# Patient Record
Sex: Male | Born: 1990
Health system: Southern US, Community
[De-identification: ages and names within clinical notes are randomized; demographics above are authoritative.]

## PROBLEM LIST (undated history)

## (undated) ENCOUNTER — Ambulatory Visit: Payer: No Typology Code available for payment source | Source: Home / Self Care

## (undated) DIAGNOSIS — A539 Syphilis, unspecified: Secondary | ICD-10-CM

## (undated) DIAGNOSIS — F419 Anxiety disorder, unspecified: Secondary | ICD-10-CM

## (undated) DIAGNOSIS — Z765 Malingerer [conscious simulation]: Secondary | ICD-10-CM

## (undated) DIAGNOSIS — M869 Osteomyelitis, unspecified: Secondary | ICD-10-CM

## (undated) DIAGNOSIS — B20 Human immunodeficiency virus [HIV] disease: Secondary | ICD-10-CM

## (undated) DIAGNOSIS — F99 Mental disorder, not otherwise specified: Secondary | ICD-10-CM

## (undated) DIAGNOSIS — Z21 Asymptomatic human immunodeficiency virus [HIV] infection status: Secondary | ICD-10-CM

## (undated) DIAGNOSIS — F319 Bipolar disorder, unspecified: Secondary | ICD-10-CM

## (undated) DIAGNOSIS — D849 Immunodeficiency, unspecified: Secondary | ICD-10-CM

## (undated) HISTORY — DX: Mental disorder, not otherwise specified: F99

## (undated) HISTORY — DX: Anxiety disorder, unspecified: F41.9

---
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
## 2010-09-27 ENCOUNTER — Emergency Department (HOSPITAL_COMMUNITY)
Admission: EM | Admit: 2010-09-27 | Discharge: 2010-09-27 | Disposition: A | Discharge status: Home / Self Care | Payer: Self-pay | Attending: Emergency Medicine | Admitting: Emergency Medicine

## 2010-09-27 DIAGNOSIS — K047 Periapical abscess without sinus: Secondary | ICD-10-CM | POA: Insufficient documentation

## 2010-09-27 DIAGNOSIS — K029 Dental caries, unspecified: Secondary | ICD-10-CM | POA: Insufficient documentation

## 2010-10-21 ENCOUNTER — Emergency Department (HOSPITAL_COMMUNITY)
Admission: EM | Admit: 2010-10-21 | Discharge: 2010-10-21 | Disposition: A | Discharge status: Home / Self Care | Payer: Self-pay | Attending: Emergency Medicine | Admitting: Emergency Medicine

## 2010-10-21 DIAGNOSIS — R10819 Abdominal tenderness, unspecified site: Secondary | ICD-10-CM | POA: Insufficient documentation

## 2010-10-21 DIAGNOSIS — R109 Unspecified abdominal pain: Secondary | ICD-10-CM | POA: Insufficient documentation

## 2010-10-21 DIAGNOSIS — R4182 Altered mental status, unspecified: Secondary | ICD-10-CM | POA: Insufficient documentation

## 2010-10-21 DIAGNOSIS — R443 Hallucinations, unspecified: Secondary | ICD-10-CM | POA: Insufficient documentation

## 2010-10-21 LAB — CBC
MCH: 26.9 pg (ref 26.0–34.0)
MCHC: 35 g/dL (ref 30.0–36.0)
MCV: 76.9 fL — ABNORMAL LOW (ref 78.0–100.0)
Platelets: 204 10*3/uL (ref 150–400)
RBC: 5.5 MIL/uL (ref 4.22–5.81)
RDW: 15.3 % (ref 11.5–15.5)

## 2010-10-21 LAB — DIFFERENTIAL
Basophils Relative: 0 % (ref 0–1)
Eosinophils Absolute: 0.1 10*3/uL (ref 0.0–0.7)
Eosinophils Relative: 1 % (ref 0–5)
Lymphs Abs: 2.7 10*3/uL (ref 0.7–4.0)
Monocytes Absolute: 0.8 10*3/uL (ref 0.1–1.0)
Monocytes Relative: 7 % (ref 3–12)
Neutrophils Relative %: 69 % (ref 43–77)

## 2010-10-21 LAB — RAPID URINE DRUG SCREEN, HOSP PERFORMED
Barbiturates: NOT DETECTED
Benzodiazepines: NOT DETECTED

## 2010-10-21 LAB — BASIC METABOLIC PANEL
BUN: 13 mg/dL (ref 6–23)
Creatinine, Ser: 1.06 mg/dL (ref 0.4–1.5)
GFR calc non Af Amer: >60 mL/min (ref >60)
Glucose, Bld: 91 mg/dL (ref 70–99)

## 2010-10-21 LAB — ETHANOL: Alcohol, Ethyl (B): <11 mg/dL — ABNORMAL HIGH (ref 0–10)

## 2011-01-16 ENCOUNTER — Other Ambulatory Visit: Payer: Self-pay

## 2011-01-16 ENCOUNTER — Ambulatory Visit (INDEPENDENT_AMBULATORY_CARE_PROVIDER_SITE_OTHER): Payer: Self-pay

## 2011-01-16 DIAGNOSIS — F319 Bipolar disorder, unspecified: Secondary | ICD-10-CM

## 2011-01-16 DIAGNOSIS — F418 Other specified anxiety disorders: Secondary | ICD-10-CM

## 2011-01-16 DIAGNOSIS — Z23 Encounter for immunization: Secondary | ICD-10-CM

## 2011-01-16 DIAGNOSIS — B20 Human immunodeficiency virus [HIV] disease: Secondary | ICD-10-CM

## 2011-01-16 LAB — CBC WITH DIFFERENTIAL/PLATELET
Basophils Absolute: 0 10*3/uL (ref 0.0–0.1)
Basophils Relative: 0 % (ref 0–1)
Lymphocytes Relative: 37 % (ref 12–46)
MCHC: 32.5 g/dL (ref 30.0–36.0)
Neutro Abs: 3.2 10*3/uL (ref 1.7–7.7)
Neutrophils Relative %: 55 % (ref 43–77)
RDW: 17.3 % — ABNORMAL HIGH (ref 11.5–15.5)
WBC: 5.8 10*3/uL (ref 4.0–10.5)

## 2011-01-16 LAB — URINALYSIS, ROUTINE W REFLEX MICROSCOPIC
Bilirubin Urine: NEGATIVE
Hgb urine dipstick: NEGATIVE
Ketones, ur: NEGATIVE mg/dL
Protein, ur: NEGATIVE mg/dL
Urobilinogen, UA: 1 mg/dL (ref 0.0–1.0)

## 2011-01-16 LAB — HEPATITIS C ANTIBODY: HCV Ab: NEGATIVE

## 2011-01-16 LAB — RPR: RPR Ser Ql: REACTIVE — AB

## 2011-01-16 NOTE — Progress Notes (Signed)
Pt presented for intake today with extreme anxiety and anxiousness.  His first words to me before I introduced myself was " I need help" "I need a lot of help" He stated his current living situation is causing him depression and anxiety and he "feels like he is going to explode". He is living with a friend who is suppose to be a Dance movement psychotherapist and father figure who just took in a new partner.  Prior to this new partner, the patient had engaged in sexual intercourse with his mentor.  Pt states he has many needs in which the mentor should be helping him with but all of his attention has switched to the new partner.   He feels he should be  on his own and has a plan to pack his things and move on Monday into a shelter. Pt says the mentor has been using his food stamp money to contribute to the need of "the house" leaving him without any money . He has no money and has no freedom because the mentor does not allow him to leave the home.  He basically has to follow him wherever he goes.  Pt states past history of : Mental health disorders, Anger Management problems, Anxiety, OCD , ADHD and "split personality disorder.  He states the alternate personality is not nice, "a bad person", causes self harm and harm to others. He has an extremely bad headache along with "stomach pulling" and extreme  anger that's last sometimes for 2-3 day ans suddenly goes away.   He has no control over the other personality.  He feels anxious and has anxiety before the other personality presents.  Today he feels the alternate personality(angry) will present soon and he is not sure what he may do. Depression present for 3-4 months. Last appearanceof the "alternate personality has been  2 months ago.  HX of multiple  mental health admissions since early childhood. He left home at age 63 and has been homeless since then. He was receiving disability when living in [place] but lost his benefits due to multiple missed mental health appointments and not  having transportation.  He is currently seeking reassignment of disability but feels his mentor will take the money.    Previous  meds:  Methadone , Ritalin and others but the names  are unknown.   He did seek help form [facility] and was given a prescription but was not fill it due to lack of funds.  [facility] counselor, Danne Harbor was called immediately to assist with transportation and support with getting pt into a mental health facility today. for an assessment.   Patient's mentor was present in waiting area and not present during the intake.   Wit the patient's permission, I spoke briefly with the "mentor" to advise him Demetrious would be leaving with Tammi today for a mental health assessment. Muhannad will decide after his eval. if he will return to his current home.  I only spoke with the"mentor" at the request  of the patient so he could safely return if needed.

## 2011-01-17 LAB — COMPLETE METABOLIC PANEL WITH GFR
Albumin: 4.6 g/dL (ref 3.5–5.2)
Alkaline Phosphatase: 69 U/L (ref 39–117)
BUN: 16 mg/dL (ref 6–23)
CO2: 27 mEq/L (ref 19–32)
GFR, Est African American: >60 mL/min (ref >60)
GFR, Est Non African American: >60 mL/min (ref >60)
Glucose, Bld: 86 mg/dL (ref 70–99)
Potassium: 4.1 mEq/L (ref 3.5–5.3)
Sodium: 140 mEq/L (ref 135–145)
Total Bilirubin: 0.4 mg/dL (ref 0.3–1.2)
Total Protein: 7.2 g/dL (ref 6.0–8.3)

## 2011-01-17 LAB — T-HELPER CELL (CD4) - (RCID CLINIC ONLY): CD4 % Helper T Cell: 34 % (ref 33–55)

## 2011-01-17 LAB — HEPATITIS B CORE ANTIBODY, IGM: Hep B C IgM: NEGATIVE

## 2011-01-17 LAB — GC/CHLAMYDIA PROBE AMP, URINE
Chlamydia, Swab/Urine, PCR: NEGATIVE
GC Probe Amp, Urine: NEGATIVE

## 2011-01-17 LAB — HEPATITIS A ANTIBODY, IGM: Hep A IgM: NEGATIVE

## 2011-01-20 LAB — HIV-1 RNA ULTRAQUANT REFLEX TO GENTYP+
HIV 1 RNA Quant: 4020 copies/mL — ABNORMAL HIGH (ref <20)
HIV-1 RNA Quant, Log: 3.6 {Log} — ABNORMAL HIGH (ref <1.30)

## 2011-01-30 ENCOUNTER — Ambulatory Visit: Payer: Self-pay | Admitting: Internal Medicine

## 2011-02-03 LAB — HIV-1 GENOTYPR PLUS

## 2011-02-10 DIAGNOSIS — F319 Bipolar disorder, unspecified: Secondary | ICD-10-CM | POA: Insufficient documentation

## 2011-02-10 DIAGNOSIS — F418 Other specified anxiety disorders: Secondary | ICD-10-CM | POA: Insufficient documentation

## 2011-02-13 ENCOUNTER — Encounter: Payer: Self-pay | Admitting: Internal Medicine

## 2011-02-13 ENCOUNTER — Ambulatory Visit (INDEPENDENT_AMBULATORY_CARE_PROVIDER_SITE_OTHER): Payer: Self-pay | Admitting: Internal Medicine

## 2011-02-13 VITALS — BP 118/83 | HR 78 | Temp 98.0°F | Ht 69.0 in | Wt 159.0 lb

## 2011-02-13 DIAGNOSIS — B20 Human immunodeficiency virus [HIV] disease: Secondary | ICD-10-CM | POA: Insufficient documentation

## 2011-02-13 DIAGNOSIS — Z23 Encounter for immunization: Secondary | ICD-10-CM

## 2011-02-13 DIAGNOSIS — A539 Syphilis, unspecified: Secondary | ICD-10-CM | POA: Insufficient documentation

## 2011-02-13 MED ORDER — DARUNAVIR ETHANOLATE 400 MG PO TABS: 800.0000 mg | ORAL_TABLET | Freq: Every day | ORAL | Status: DC

## 2011-02-13 MED ORDER — RITONAVIR 100 MG PO TABS: 100.0000 mg | ORAL_TABLET | Freq: Every day | ORAL | Status: DC

## 2011-02-13 MED ORDER — EMTRICITABINE-TENOFOVIR DF 200-300 MG PO TABS: 1.0000 | ORAL_TABLET | Freq: Every day | ORAL | Status: DC

## 2011-02-13 NOTE — Progress Notes (Signed)
  Subjective:    Patient ID: Blake Chambers, male    DOB: 03/06/1991, 20 y.o.   MRN: [id number]  HPI this is a 20 year old male newly diagnosed HIV. He was initially diagnosed in June of this year.the patient tells me that he had a negative HIV test several months prior to that test. He also states to me that he has had previous tests which were negative. His risk factor is MSM. He denies any history of gonorrhea or chlamydia. 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 He is currently living in a homeless shelter and is getting set up for Medicaid. He also has recently been started on Abilify for what he describes as a sleeping disorder as well as hearing voices. He states that in his shelter he is doing well and is happier with his current living situation than he was previously. Today he has no specific complaints. He does state that he recently used a penile ring that has left a mark on his penis but otherwise no discharge or other lesions.    Review of Systems  8 complete 12 point review of systems was obtained and is negative except as per the history of present illness.    Objective:   Physical Exam  Constitutional: He is oriented to person, place, and time. He appears well-developed and well-nourished.  HENT:  Mouth/Throat: Oropharynx is clear and moist. No oropharyngeal exudate.  Cardiovascular: Normal rate, regular rhythm and normal heart sounds.   No murmur heard. Pulmonary/Chest: Effort normal and breath sounds normal. No respiratory distress.  Abdominal: Soft. Bowel sounds are normal. He exhibits no distension. There is no tenderness.  Genitourinary: Penis normal. No penile tenderness.       + bruising in a circumferential area near the base. No lesions, no discharge.  Circumcised.   Neurological: He is alert and oriented to person, place, and time.  Skin: Skin is warm and  dry. No erythema.  Psychiatric: He has a normal mood and affect. His behavior is normal.          Assessment & Plan:

## 2011-02-13 NOTE — Assessment & Plan Note (Signed)
I discussed at length with the patient his diagnosis of HIV. He has not previously been diagnosed. I discussed the options of medications in the long-term side effects as well short-term side effects. I also discussed the need to be compliant with medications. The patient states to me that he is motivated to start medications and feels that he is very capable of taking medicines daily even though he is living in a home homeless shelter. He is motivated to take care of himself and is interested in vitamins as well as healthy eating and exercise. I encouraged all those activities. Despite his motivation however I am hesitant to start a trip lobe due to his psychiatric diagnosis as well at his living situation. I do feel he would benefit from a medication regimen that has a higher barriers to resistance. Therefore I will have him start on boosted Prezista along with Truvada. His current CD4 is 760, therefore there is no indication to start the medications immediately. He therefore can wait until he is established with Medicaid. I will have him followup one month after starting his medications for labs to see if his virus is becoming suppressed.

## 2011-02-13 NOTE — Assessment & Plan Note (Signed)
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 He received 3 weekly doses of penicillin. I will repeat the RPR in 6 months.

## 2011-04-23 ENCOUNTER — Telehealth: Payer: Self-pay | Admitting: *Deleted

## 2011-04-23 NOTE — Telephone Encounter (Signed)
Blake Chambers, his case manager states he has moved to [facility] in August & will be seen by mds there. She wants a letter stating his staus & latest cd4 & VL. Fax to [phone]. done

## 2012-04-30 ENCOUNTER — Encounter (HOSPITAL_COMMUNITY): Payer: Self-pay

## 2012-04-30 ENCOUNTER — Emergency Department (HOSPITAL_COMMUNITY)
Admission: EM | Admit: 2012-04-30 | Discharge: 2012-04-30 | Disposition: A | Discharge status: Home / Self Care | Payer: Self-pay | Attending: Emergency Medicine | Admitting: Emergency Medicine

## 2012-04-30 DIAGNOSIS — F17200 Nicotine dependence, unspecified, uncomplicated: Secondary | ICD-10-CM | POA: Insufficient documentation

## 2012-04-30 DIAGNOSIS — R109 Unspecified abdominal pain: Secondary | ICD-10-CM | POA: Insufficient documentation

## 2012-04-30 DIAGNOSIS — Z8659 Personal history of other mental and behavioral disorders: Secondary | ICD-10-CM | POA: Insufficient documentation

## 2012-04-30 DIAGNOSIS — N489 Disorder of penis, unspecified: Secondary | ICD-10-CM | POA: Insufficient documentation

## 2012-04-30 DIAGNOSIS — R319 Hematuria, unspecified: Secondary | ICD-10-CM | POA: Insufficient documentation

## 2012-04-30 DIAGNOSIS — B20 Human immunodeficiency virus [HIV] disease: Secondary | ICD-10-CM | POA: Insufficient documentation

## 2012-04-30 DIAGNOSIS — F411 Generalized anxiety disorder: Secondary | ICD-10-CM | POA: Insufficient documentation

## 2012-04-30 DIAGNOSIS — A64 Unspecified sexually transmitted disease: Secondary | ICD-10-CM | POA: Insufficient documentation

## 2012-04-30 HISTORY — DX: Immunodeficiency, unspecified: D84.9

## 2012-04-30 HISTORY — DX: Bipolar disorder, unspecified: F31.9

## 2012-04-30 HISTORY — DX: Human immunodeficiency virus (HIV) disease: B20

## 2012-04-30 HISTORY — DX: Asymptomatic human immunodeficiency virus (hiv) infection status: Z21

## 2012-04-30 LAB — URINE MICROSCOPIC-ADD ON

## 2012-04-30 LAB — URINALYSIS, ROUTINE W REFLEX MICROSCOPIC
Hgb urine dipstick: NEGATIVE
Nitrite: NEGATIVE
Protein, ur: NEGATIVE mg/dL
Urobilinogen, UA: 1 mg/dL (ref 0.0–1.0)

## 2012-04-30 MED ORDER — CEFTRIAXONE SODIUM 250 MG IJ SOLR
250.0000 mg | Freq: Once | INTRAMUSCULAR | Status: AC
  Administered 2012-04-30: 250 mg via INTRAMUSCULAR
  Filled 2012-04-30: qty 250

## 2012-04-30 MED ORDER — LIDOCAINE HCL (PF) 1 % IJ SOLN
INTRAMUSCULAR | Status: AC
  Administered 2012-04-30: 0.9 mL
  Filled 2012-04-30: qty 5

## 2012-04-30 MED ORDER — AZITHROMYCIN 250 MG PO TABS
1000.0000 mg | ORAL_TABLET | Freq: Once | ORAL | Status: AC
  Administered 2012-04-30: 1000 mg via ORAL
  Filled 2012-04-30: qty 4

## 2012-04-30 NOTE — ED Provider Notes (Signed)
History   This chart was scribed for [employer]. Rubin Payor, MD, by Marcina Millard scribe. The patient was seen in room TR05C/TR05C and the patient's care was started at 1440.    CSN: [id number]  Arrival date & time 04/30/12  1318   First MD Initiated Contact with Patient 04/30/12 1440      Chief Complaint  Patient presents with  . SEXUALLY TRANSMITTED DISEASE    (Consider location/radiation/quality/duration/timing/severity/associated sxs/prior treatment) HPI Comments: Milliard Bores is a 21 y.o. male who presents to the Emergency Department complaining of moderate, constant hematuria with associated burning, discharge, and mild a that began PTA. He states that he was exposed to gonorrhea 2 weeks and received the official diagnosis 3 days ago. He denies any associated fever. He states that he was diagnosed with HIV 1 year ago and has not seen his physician in 1 year while he has been trying to obtain coverage with medicaid. He states that he feels that it's controlled, but will want to follow up soon. He has a h/o of adhd and depression, but is not currently taking medication; however, he feels that these are controlled most of the time so he is not seeking assistance with these issues in ED.    Past Medical History  Diagnosis Date  . Anxiety   . Mental health disorder   . Immune deficiency disorder   . HIV (human immunodeficiency virus infection)   . Bipolar affective     History reviewed. No pertinent past surgical history.  History reviewed. No pertinent family history.  History  Substance Use Topics  . Smoking status: Current Every Day Smoker -- 0.3 packs/day    Types: Cigarettes  . Smokeless tobacco: Never Used  . Alcohol Use: 0.5 oz/week    1 drink(s) per week     Comment: occasional      Review of Systems  Gastrointestinal: Positive for abdominal pain.  Genitourinary: Positive for hematuria, discharge and penile pain.  All other systems reviewed and are  negative.    Allergies  Review of patient's allergies indicates no known allergies.  Home Medications   No current outpatient prescriptions on file.  BP 116/69  Pulse 93  Temp 97.5 F (36.4 C) (Oral)  Resp 18  SpO2 97%  Physical Exam  Nursing note and vitals reviewed. Constitutional: He is oriented to person, place, and time. He appears well-developed and well-nourished. No distress.  HENT:  Head: Normocephalic and atraumatic.  Eyes: EOM are normal. Pupils are equal, round, and reactive to light.  Neck: Normal range of motion. Neck supple. No tracheal deviation present.  Cardiovascular: Normal rate.   Pulmonary/Chest: Effort normal. No respiratory distress.  Abdominal: Soft. He exhibits no distension.  Genitourinary: Discharge found.       He does not have any masses or rashes present.  Musculoskeletal: Normal range of motion. He exhibits no edema.  Neurological: He is alert and oriented to person, place, and time.  Skin: Skin is warm and dry.  Psychiatric: He has a normal mood and affect. His behavior is normal.    ED Course  Procedures (including critical care time)  COORDINATION OF CARE:  15:07- Discussed planned course of treatment with the patient,  Including a UA, who is agreeable at this time.  15:15- Medication Orders- ceftriaxone (ROCEPHIN) injection 250 mg- Once, azithromycin (ZITHROMAX) tablet 1,000-Once.   Labs Reviewed  URINALYSIS, ROUTINE W REFLEX MICROSCOPIC - Abnormal; Notable for the following:    APPearance HAZY (*)  Leukocytes, UA MODERATE (*)     All other components within normal limits  URINE MICROSCOPIC-ADD ON - Abnormal; Notable for the following:    Bacteria, UA FEW (*)     All other components within normal limits  URINE CULTURE  GC/CHLAMYDIA PROBE AMP   No results found.   1. STD (sexually transmitted disease)       MDM  Patient with HIV with good CD4 count. Has been exposed to an STD. He was treated and swabbed. he has  known syphilis and HIV so those were not sent  I personally performed the services described in this documentation, which was scribed in my presence. The recorded information has been reviewed and is accurate.         Juliet Rude. Rubin Payor, MD 04/30/12 2016

## 2012-04-30 NOTE — ED Notes (Signed)
Patient is alert and orientedx4.  Patient understood discharge instructions and had no questions. Patient is taking bus home.

## 2012-04-30 NOTE — ED Notes (Signed)
Pt sts burning and blood in urine.  Pt sts he was exposed 2 weeks ago.

## 2012-05-01 LAB — URINE CULTURE: Culture: NO GROWTH

## 2012-05-04 LAB — GC/CHLAMYDIA PROBE AMP: CT Probe RNA: NEGATIVE

## 2012-05-19 ENCOUNTER — Other Ambulatory Visit (INDEPENDENT_AMBULATORY_CARE_PROVIDER_SITE_OTHER): Payer: Self-pay

## 2012-05-19 DIAGNOSIS — Z113 Encounter for screening for infections with a predominantly sexual mode of transmission: Secondary | ICD-10-CM

## 2012-05-19 DIAGNOSIS — B20 Human immunodeficiency virus [HIV] disease: Secondary | ICD-10-CM

## 2012-05-19 DIAGNOSIS — Z79899 Other long term (current) drug therapy: Secondary | ICD-10-CM

## 2012-05-19 LAB — CBC WITH DIFFERENTIAL/PLATELET
Basophils Relative: 1 % (ref 0–1)
Eosinophils Absolute: 0.2 10*3/uL (ref 0.0–0.7)
HCT: 41.5 % (ref 39.0–52.0)
Hemoglobin: 14 g/dL (ref 13.0–17.0)
MCH: 26.4 pg (ref 26.0–34.0)
MCHC: 33.7 g/dL (ref 30.0–36.0)
MCV: 78.3 fL (ref 78.0–100.0)
Monocytes Absolute: 0.5 10*3/uL (ref 0.1–1.0)
Monocytes Relative: 8 % (ref 3–12)
Neutro Abs: 2.6 10*3/uL (ref 1.7–7.7)

## 2012-05-20 LAB — COMPREHENSIVE METABOLIC PANEL
Albumin: 4.4 g/dL (ref 3.5–5.2)
Alkaline Phosphatase: 76 U/L (ref 39–117)
BUN: 13 mg/dL (ref 6–23)
Glucose, Bld: 102 mg/dL — ABNORMAL HIGH (ref 70–99)
Potassium: 4 mEq/L (ref 3.5–5.3)

## 2012-05-20 LAB — T-HELPER CELL (CD4) - (RCID CLINIC ONLY)
CD4 % Helper T Cell: 27 % — ABNORMAL LOW (ref 33–55)
CD4 T Cell Abs: 640 uL (ref 400–2700)

## 2012-05-20 LAB — RPR TITER: RPR Titer: 1:16 {titer} — AB

## 2012-06-01 ENCOUNTER — Ambulatory Visit: Payer: Self-pay

## 2012-06-02 ENCOUNTER — Telehealth: Payer: Self-pay | Admitting: *Deleted

## 2012-06-02 ENCOUNTER — Encounter: Payer: Self-pay | Admitting: Internal Medicine

## 2012-06-02 ENCOUNTER — Ambulatory Visit: Payer: Self-pay

## 2012-06-02 ENCOUNTER — Ambulatory Visit (INDEPENDENT_AMBULATORY_CARE_PROVIDER_SITE_OTHER): Payer: Self-pay | Admitting: Internal Medicine

## 2012-06-02 VITALS — BP 120/77 | HR 84 | Temp 97.3°F | Ht 69.0 in | Wt 156.0 lb

## 2012-06-02 DIAGNOSIS — B20 Human immunodeficiency virus [HIV] disease: Secondary | ICD-10-CM

## 2012-06-02 DIAGNOSIS — Z23 Encounter for immunization: Secondary | ICD-10-CM

## 2012-06-02 DIAGNOSIS — A539 Syphilis, unspecified: Secondary | ICD-10-CM

## 2012-06-02 MED ORDER — RITONAVIR 100 MG PO TABS: 100.0000 mg | ORAL_TABLET | Freq: Every day | ORAL | Status: DC

## 2012-06-02 MED ORDER — DARUNAVIR ETHANOLATE 800 MG PO TABS: 800.0000 mg | ORAL_TABLET | Freq: Every day | ORAL | Status: DC

## 2012-06-02 MED ORDER — EMTRICITABINE-TENOFOVIR DF 200-300 MG PO TABS: 1.0000 | ORAL_TABLET | Freq: Every day | ORAL | Status: DC

## 2012-06-02 NOTE — Telephone Encounter (Signed)
Have tried to reach this patient unsuccessfully on several occasions. Called his emergency contact who referred to patient as "his soon" and advised him to have the patient to give the clinic a call asap. He advised he would see the patient this evening and would give him the message. Did not give the contact any information other than the clinic number.

## 2012-06-02 NOTE — Telephone Encounter (Signed)
Called and left patient a voice mail to return my call, he had a positive RPR and needs to be treated x 1 with Bicillin. [name] CMA

## 2012-06-03 NOTE — Assessment & Plan Note (Signed)
I will have him start Prezista, Norvir and Truvada once he gets established with the drug assistance program.

## 2012-06-03 NOTE — Assessment & Plan Note (Signed)
He is going to be called back to be retreated with one dose with an elevated titer.

## 2012-06-03 NOTE — Progress Notes (Signed)
  Subjective:    Patient ID: Blake Chambers, male    DOB: 1991-03-26, 21 y.o.   MRN: [id number]  HPI He comes in for follow up of his HIV. He was last seen about one year ago and at that time was interested in starting medication, however never followed up or got established. He now returns again to start medication. He has had no hospitalizations. He does still struggle with homelessness. He has otherwise no new issues.   Review of Systems  Constitutional: Negative for fever, fatigue and unexpected weight change.  HENT: Negative for sore throat and trouble swallowing.   Respiratory: Negative for cough and shortness of breath.   Cardiovascular: Negative for chest pain, palpitations and leg swelling.  Gastrointestinal: Negative for nausea, abdominal pain and diarrhea.  Musculoskeletal: Negative for myalgias, joint swelling and arthralgias.  Neurological: Negative for dizziness and headaches.       Objective:   Physical Exam  Constitutional: He appears well-developed and well-nourished. No distress.  HENT:  Mouth/Throat: Oropharynx is clear and moist. No oropharyngeal exudate.  Cardiovascular: Normal rate, regular rhythm and normal heart sounds.  Exam reveals no gallop and no friction rub.   No murmur heard. Pulmonary/Chest: Effort normal and breath sounds normal. No respiratory distress. He has no wheezes. He has no rales.  Lymphadenopathy:    He has no cervical adenopathy.          Assessment & Plan:

## 2012-07-02 ENCOUNTER — Telehealth: Payer: Self-pay | Admitting: *Deleted

## 2012-07-02 NOTE — Telephone Encounter (Signed)
Yes, he should start taking his medicine and change his lab appointment to about 1 month later and see me two weeks after that.

## 2012-07-02 NOTE — Telephone Encounter (Signed)
Pt currently out of town and worried he missed his appointment. Also, wanted to know when he should start taking his medication.  Advised patient his lab appointment is 07/22/12 with follow up on 08/05/12.  Patient notified that he does have ADAP approval through 09/13/12 and that he should fill his prescriptions and begin taking them. Pt asked for suggestion of pharmacy for his medications. As his housing is unreliable, patient was advised to take his written prescriptions to the [facility] on [address] once he returns to town on Monday.  Reiterated the importance of medication compliance. Patient verbalized understanding.  Will refer to [facility] d/t patient's difficulty with transportation and housing. Andree Coss, RN

## 2012-07-09 NOTE — Telephone Encounter (Signed)
Attempted to call Blake Chambers numerous times this week. Left generic messages on his mobile number (voicemail states it is "Blake Chambers" phone), his home number is disconnected.  Have referred him to [facility]. Andree Coss, RN

## 2012-07-22 ENCOUNTER — Other Ambulatory Visit (INDEPENDENT_AMBULATORY_CARE_PROVIDER_SITE_OTHER): Payer: Self-pay

## 2012-07-22 ENCOUNTER — Other Ambulatory Visit (HOSPITAL_COMMUNITY)
Admission: RE | Admit: 2012-07-22 | Discharge: 2012-07-22 | Disposition: A | Discharge status: Home / Self Care | Payer: Medicaid Other | Source: Ambulatory Visit | Attending: Internal Medicine | Admitting: Internal Medicine

## 2012-07-22 DIAGNOSIS — B20 Human immunodeficiency virus [HIV] disease: Secondary | ICD-10-CM

## 2012-07-22 DIAGNOSIS — Z113 Encounter for screening for infections with a predominantly sexual mode of transmission: Secondary | ICD-10-CM

## 2012-07-22 LAB — CBC WITH DIFFERENTIAL/PLATELET
Basophils Absolute: 0 10*3/uL (ref 0.0–0.1)
Basophils Relative: 0 % (ref 0–1)
Eosinophils Absolute: 0.1 10*3/uL (ref 0.0–0.7)
Eosinophils Relative: 2 % (ref 0–5)
HCT: 44.4 % (ref 39.0–52.0)
MCH: 26.8 pg (ref 26.0–34.0)
MCHC: 33.1 g/dL (ref 30.0–36.0)
MCV: 80.9 fL (ref 78.0–100.0)
Monocytes Absolute: 0.5 10*3/uL (ref 0.1–1.0)
Monocytes Relative: 9 % (ref 3–12)
Neutro Abs: 2.8 10*3/uL (ref 1.7–7.7)
RDW: 16.6 % — ABNORMAL HIGH (ref 11.5–15.5)

## 2012-07-22 LAB — COMPLETE METABOLIC PANEL WITH GFR
AST: 28 U/L (ref 0–37)
Albumin: 4.4 g/dL (ref 3.5–5.2)
Alkaline Phosphatase: 65 U/L (ref 39–117)
BUN: 14 mg/dL (ref 6–23)
Creat: 1.07 mg/dL (ref 0.50–1.35)
GFR, Est Non African American: >89 mL/min
Glucose, Bld: 72 mg/dL (ref 70–99)
Total Bilirubin: 0.4 mg/dL (ref 0.3–1.2)

## 2012-08-05 ENCOUNTER — Ambulatory Visit: Payer: Self-pay | Admitting: Internal Medicine

## 2012-08-26 ENCOUNTER — Ambulatory Visit: Payer: Self-pay | Admitting: Internal Medicine

## 2012-09-16 ENCOUNTER — Ambulatory Visit (INDEPENDENT_AMBULATORY_CARE_PROVIDER_SITE_OTHER): Payer: Self-pay | Admitting: Internal Medicine

## 2012-09-16 ENCOUNTER — Encounter: Payer: Self-pay | Admitting: Internal Medicine

## 2012-09-16 VITALS — BP 120/75 | HR 92 | Temp 98.0°F | Ht 69.0 in | Wt 157.0 lb

## 2012-09-16 DIAGNOSIS — B20 Human immunodeficiency virus [HIV] disease: Secondary | ICD-10-CM

## 2012-09-16 LAB — CBC WITH DIFFERENTIAL/PLATELET
Basophils Relative: 0 % (ref 0–1)
Eosinophils Absolute: 0.1 10*3/uL (ref 0.0–0.7)
Eosinophils Relative: 2 % (ref 0–5)
Hemoglobin: 13.5 g/dL (ref 13.0–17.0)
Lymphs Abs: 2.3 10*3/uL (ref 0.7–4.0)
MCH: 26.9 pg (ref 26.0–34.0)
MCHC: 33.3 g/dL (ref 30.0–36.0)
MCV: 80.9 fL (ref 78.0–100.0)
Monocytes Relative: 7 % (ref 3–12)
Neutrophils Relative %: 50 % (ref 43–77)
Platelets: 200 10*3/uL (ref 150–400)
RBC: 5.02 MIL/uL (ref 4.22–5.81)

## 2012-09-16 LAB — COMPLETE METABOLIC PANEL WITH GFR
ALT: 21 U/L (ref 0–53)
AST: 30 U/L (ref 0–37)
CO2: 29 mEq/L (ref 19–32)
Calcium: 9.3 mg/dL (ref 8.4–10.5)
Chloride: 105 mEq/L (ref 96–112)
GFR, Est African American: 81 mL/min
Potassium: 3.8 mEq/L (ref 3.5–5.3)
Sodium: 140 mEq/L (ref 135–145)
Total Protein: 7 g/dL (ref 6.0–8.3)

## 2012-09-16 NOTE — Assessment & Plan Note (Signed)
At this point, he should have good viral suppression and I will check his labs. If there any concerns she will be called and seen soon otherwise I will see him in 2 months with labs.

## 2012-09-16 NOTE — Progress Notes (Signed)
  Subjective:    Patient ID: Blake Chambers, male    DOB: 08/18/1990, 22 y.o.   MRN: [id number]  HPI  He comes in for followup of his HIV. I initially saw him in August of 2012 and he did not return after his initial visit until December of 2013. At that time we discussed starting medication through the drug assistance program and after some delay, he started presents to, Norvir and Truvada. He states he's been on that for about 6 weeks. He denies any problems with it, no missed doses. He otherwise feels well and has no complaints. His recent CD4 count is in the 600s. He is gaining weight and otherwise feels well. He continues to live with a friend over he feels his living situation and improving and he will be getting a stable place to live on his own soon. No recent illnesses and no recent hospitalizations.  Review of Systems  Constitutional: Negative for fever, appetite change, fatigue and unexpected weight change.  HENT: Negative for sore throat and trouble swallowing.   Respiratory: Negative for cough and shortness of breath.   Cardiovascular: Negative for chest pain, palpitations and leg swelling.  Gastrointestinal: Negative for nausea, abdominal pain and diarrhea.  Musculoskeletal: Negative for myalgias, joint swelling and arthralgias.  Skin: Negative for rash.  Neurological: Negative for dizziness and headaches.       Objective:   Physical Exam  Constitutional: He appears well-developed and well-nourished. No distress.  HENT:  Mouth/Throat: No oropharyngeal exudate.  Cardiovascular: Normal rate, regular rhythm and normal heart sounds.  Exam reveals no gallop and no friction rub.   No murmur heard. Pulmonary/Chest: Effort normal and breath sounds normal. No respiratory distress. He has no wheezes. He has no rales.  Abdominal: Soft. Bowel sounds are normal. He exhibits no distension. There is no tenderness. There is no rebound.  Skin: No rash noted.          Assessment & Plan:

## 2012-09-19 LAB — HIV-1 RNA QUANT-NO REFLEX-BLD
HIV 1 RNA Quant: 75 copies/mL — ABNORMAL HIGH (ref <20)
HIV-1 RNA Quant, Log: 1.88 {Log} — ABNORMAL HIGH (ref <1.30)

## 2012-09-20 ENCOUNTER — Other Ambulatory Visit: Payer: Self-pay | Admitting: Internal Medicine

## 2012-09-20 DIAGNOSIS — B20 Human immunodeficiency virus [HIV] disease: Secondary | ICD-10-CM

## 2012-09-22 ENCOUNTER — Encounter: Payer: Self-pay | Admitting: *Deleted

## 2012-09-27 ENCOUNTER — Telehealth: Payer: Self-pay | Admitting: *Deleted

## 2012-09-27 NOTE — Telephone Encounter (Signed)
Gave to [employer], no phone or address for patient. Roommate said he may have moved to [facility]. [name]

## 2012-09-27 NOTE — Telephone Encounter (Signed)
Message copied by Macy Mis on Mon Sep 27, 2012 11:56 AM ------      Message from: Gardiner Barefoot      Created: Mon Sep 20, 2012  9:52 AM       His creat is elevated.  Please recheck a BMP and do an HLA [id number] as well to see if he needs to change therapy. Thanks ------

## 2012-11-16 ENCOUNTER — Other Ambulatory Visit: Payer: Self-pay | Admitting: Internal Medicine

## 2012-11-16 ENCOUNTER — Other Ambulatory Visit: Payer: Self-pay

## 2012-11-16 DIAGNOSIS — B20 Human immunodeficiency virus [HIV] disease: Secondary | ICD-10-CM

## 2012-11-16 LAB — BASIC METABOLIC PANEL
Chloride: 100 mEq/L (ref 96–112)
Potassium: 4.7 mEq/L (ref 3.5–5.3)

## 2012-11-17 LAB — T-HELPER CELL (CD4) - (RCID CLINIC ONLY): CD4 T Cell Abs: 750 uL (ref 400–2700)

## 2012-11-18 LAB — HIV-1 RNA QUANT-NO REFLEX-BLD
HIV 1 RNA Quant: <20 copies/mL (ref <20)
HIV-1 RNA Quant, Log: <1.3 {Log} (ref <1.30)

## 2012-11-22 LAB — HLA B*5701

## 2012-11-30 ENCOUNTER — Encounter: Payer: Self-pay | Admitting: Internal Medicine

## 2012-11-30 ENCOUNTER — Ambulatory Visit (INDEPENDENT_AMBULATORY_CARE_PROVIDER_SITE_OTHER): Payer: Self-pay | Admitting: Internal Medicine

## 2012-11-30 VITALS — BP 116/74 | HR 89 | Temp 98.1°F | Ht 69.0 in | Wt 144.0 lb

## 2012-11-30 DIAGNOSIS — B20 Human immunodeficiency virus [HIV] disease: Secondary | ICD-10-CM

## 2012-11-30 DIAGNOSIS — N182 Chronic kidney disease, stage 2 (mild): Secondary | ICD-10-CM

## 2012-11-30 DIAGNOSIS — N2889 Other specified disorders of kidney and ureter: Secondary | ICD-10-CM | POA: Insufficient documentation

## 2012-11-30 MED ORDER — RITONAVIR 100 MG PO TABS: 100.0000 mg | ORAL_TABLET | Freq: Every day | ORAL | Status: DC

## 2012-11-30 MED ORDER — DARUNAVIR ETHANOLATE 800 MG PO TABS: 800.0000 mg | ORAL_TABLET | Freq: Every day | ORAL | Status: DC

## 2012-11-30 MED ORDER — ABACAVIR SULFATE-LAMIVUDINE 600-300 MG PO TABS: 1.0000 | ORAL_TABLET | Freq: Every day | ORAL | Status: DC

## 2012-11-30 NOTE — Assessment & Plan Note (Signed)
He is doing well in his regimen. Unfortunately with his increased creatinine, and concerned with tenofovir. I am going to switch him to Epzicom to go with his Prezista and Norvir. He will start this with his next refill will follow up with him in 3 months.

## 2012-11-30 NOTE — Progress Notes (Signed)
  Subjective:    Patient ID: Blake Chambers, male    DOB: 03-06-1991, 22 y.o.   MRN: [id number]  HPI He comes in for followup of HIV. He was started on Prezista, Norvir and Truvada he was on last year but then stopped. He then came back earlier this year and restarted his medications and had good response at his last visit. He is in now for routine followup and reports excellent compliance with his medications and no side effects. No weight loss, diarrhea. Of note, his creatinine has elevated slightly above the normal range and this is on the repeat lab as well. He is HLA negative. He has no dysuria or increased urinary frequency.   Review of Systems  Constitutional: Negative for fever and fatigue.  HENT: Negative for sore throat and trouble swallowing.   Respiratory: Negative for shortness of breath.   Cardiovascular: Negative for leg swelling.  Gastrointestinal: Negative for nausea and diarrhea.  Endocrine: Negative for polydipsia and polyuria.  Genitourinary: Negative for dysuria, frequency and difficulty urinating.  Musculoskeletal: Negative for myalgias, joint swelling and arthralgias.  Skin: Negative for rash.  Neurological: Negative for dizziness, light-headedness and headaches.  Hematological: Negative for adenopathy.  Psychiatric/Behavioral: Negative for dysphoric mood.       Objective:   Physical Exam  Constitutional: He appears well-developed and well-nourished. No distress.  HENT:  Mouth/Throat: Oropharynx is clear and moist. No oropharyngeal exudate.  Eyes: No scleral icterus.  Cardiovascular: Normal rate, regular rhythm and normal heart sounds.   No murmur heard. Pulmonary/Chest: Effort normal and breath sounds normal. No respiratory distress. He has no wheezes.  Lymphadenopathy:    He has no cervical adenopathy.          Assessment & Plan:

## 2012-11-30 NOTE — Assessment & Plan Note (Signed)
This is mild and stable and seems to be related to restarting his medication. As above, I will change him to a tenofovir sparing regimen

## 2013-03-03 ENCOUNTER — Other Ambulatory Visit (INDEPENDENT_AMBULATORY_CARE_PROVIDER_SITE_OTHER): Payer: Self-pay

## 2013-03-03 DIAGNOSIS — B20 Human immunodeficiency virus [HIV] disease: Secondary | ICD-10-CM

## 2013-03-03 LAB — COMPLETE METABOLIC PANEL WITH GFR
ALT: 19 U/L (ref 0–53)
AST: 19 U/L (ref 0–37)
Albumin: 4.3 g/dL (ref 3.5–5.2)
BUN: 10 mg/dL (ref 6–23)
Calcium: 9.6 mg/dL (ref 8.4–10.5)
Chloride: 101 mEq/L (ref 96–112)
Potassium: 3.7 mEq/L (ref 3.5–5.3)
Sodium: 139 mEq/L (ref 135–145)
Total Protein: 7.1 g/dL (ref 6.0–8.3)

## 2013-03-03 LAB — CBC WITH DIFFERENTIAL/PLATELET
Basophils Absolute: 0 10*3/uL (ref 0.0–0.1)
Eosinophils Absolute: 0.3 10*3/uL (ref 0.0–0.7)
Eosinophils Relative: 3 % (ref 0–5)
HCT: 43.6 % (ref 39.0–52.0)
MCH: 27.9 pg (ref 26.0–34.0)
MCHC: 33.7 g/dL (ref 30.0–36.0)
MCV: 82.9 fL (ref 78.0–100.0)
Monocytes Absolute: 0.6 10*3/uL (ref 0.1–1.0)
Platelets: 245 10*3/uL (ref 150–400)
RDW: 17.3 % — ABNORMAL HIGH (ref 11.5–15.5)

## 2013-03-04 LAB — HIV-1 RNA QUANT-NO REFLEX-BLD: HIV 1 RNA Quant: <20 copies/mL (ref <20)

## 2013-03-17 ENCOUNTER — Telehealth: Payer: Self-pay | Admitting: *Deleted

## 2013-03-17 ENCOUNTER — Ambulatory Visit: Payer: Self-pay | Admitting: Internal Medicine

## 2013-03-17 NOTE — Telephone Encounter (Signed)
Attempted to call pt re: missed visit; was unable to leave a message on his voice mail as it has not yet been set up.  Will let [place] at [facility] know - she is his case Production designer, theatre/television/film. Andree Coss, RN

## 2013-03-31 ENCOUNTER — Ambulatory Visit: Payer: Self-pay | Admitting: Internal Medicine

## 2013-04-07 ENCOUNTER — Encounter: Payer: Self-pay | Admitting: Internal Medicine

## 2013-04-07 ENCOUNTER — Ambulatory Visit (INDEPENDENT_AMBULATORY_CARE_PROVIDER_SITE_OTHER): Payer: Self-pay | Admitting: Internal Medicine

## 2013-04-07 VITALS — BP 125/85 | HR 73 | Temp 98.1°F | Ht 68.0 in | Wt 158.0 lb

## 2013-04-07 DIAGNOSIS — A539 Syphilis, unspecified: Secondary | ICD-10-CM

## 2013-04-07 DIAGNOSIS — N182 Chronic kidney disease, stage 2 (mild): Secondary | ICD-10-CM

## 2013-04-07 DIAGNOSIS — Z113 Encounter for screening for infections with a predominantly sexual mode of transmission: Secondary | ICD-10-CM

## 2013-04-07 DIAGNOSIS — B20 Human immunodeficiency virus [HIV] disease: Secondary | ICD-10-CM

## 2013-04-07 DIAGNOSIS — Z23 Encounter for immunization: Secondary | ICD-10-CM

## 2013-04-07 NOTE — Assessment & Plan Note (Signed)
He is doing very well with his regimen and will continue. Return to clinic in 3 months

## 2013-04-07 NOTE — Assessment & Plan Note (Signed)
This has resolved after stopping tenofovir.

## 2013-04-07 NOTE — Progress Notes (Signed)
  Subjective:    Patient ID: Blake Chambers, male    DOB: 09-19-1990, 22 y.o.   MRN: [id number]  HPI  He comes in for followup of HIV. He was started on Prezista, Norvir and Truvada he was on last year but then stopped. He then came back earlier this year and restarted his medications and had good response at his last visit. He was switched at that time to Epzicom with Prezista and ritonavir after having renal insufficiency on Truvada. His creatinine is now returned to normal and he continues to have excellent compliance with no missed doses. No weight loss. He is getting housing and does get food assistance through bridge counseling.   Review of Systems  Constitutional: Negative for fever and fatigue.  HENT: Negative for sore throat and trouble swallowing.   Respiratory: Negative for shortness of breath.   Cardiovascular: Negative for leg swelling.  Gastrointestinal: Negative for nausea and diarrhea.  Endocrine: Negative for polydipsia and polyuria.  Genitourinary: Negative for dysuria, frequency and difficulty urinating.  Musculoskeletal: Negative for arthralgias, joint swelling and myalgias.  Skin: Negative for rash.  Neurological: Negative for dizziness, light-headedness and headaches.  Hematological: Negative for adenopathy.  Psychiatric/Behavioral: Negative for dysphoric mood.       Objective:   Physical Exam  Constitutional: He appears well-developed and well-nourished. No distress.  HENT:  Mouth/Throat: Oropharynx is clear and moist. No oropharyngeal exudate.  Eyes: No scleral icterus.  Cardiovascular: Normal rate, regular rhythm and normal heart sounds.   No murmur heard. Pulmonary/Chest: Effort normal and breath sounds normal. No respiratory distress. He has no wheezes.  Lymphadenopathy:    He has no cervical adenopathy.          Assessment & Plan:

## 2013-04-07 NOTE — Assessment & Plan Note (Signed)
Previously treated. As we recheck next visit

## 2013-06-30 ENCOUNTER — Other Ambulatory Visit (INDEPENDENT_AMBULATORY_CARE_PROVIDER_SITE_OTHER): Payer: Self-pay

## 2013-06-30 DIAGNOSIS — B20 Human immunodeficiency virus [HIV] disease: Secondary | ICD-10-CM

## 2013-06-30 DIAGNOSIS — Z113 Encounter for screening for infections with a predominantly sexual mode of transmission: Secondary | ICD-10-CM

## 2013-06-30 LAB — SYPHILIS: RPR W/REFLEX TO RPR TITER AND TREPONEMAL ANTIBODIES, TRADITIONAL SCREENING AND DIAGNOSIS ALGORITHM: RPR Ser Ql: REACTIVE — AB

## 2013-06-30 LAB — RPR TITER: RPR Titer: 1:4 {titer}

## 2013-07-01 ENCOUNTER — Telehealth: Payer: Self-pay

## 2013-07-01 LAB — T.PALLIDUM AB, TOTAL: T pallidum Antibodies (TP-PA): >8 S/CO — ABNORMAL HIGH (ref <0.90)

## 2013-07-01 NOTE — Telephone Encounter (Addendum)
Barbara CowerJason from [facility] called to say patient was given as a contact or syphilis.  Pt told [facility] of their up coming appointment with Dr Luciana Axeomer.   Laurell Josephsammy K King, RN

## 2013-07-03 LAB — HIV-1 RNA QUANT-NO REFLEX-BLD
HIV 1 RNA Quant: 41 copies/mL — ABNORMAL HIGH (ref <20)
HIV-1 RNA QUANT, LOG: 1.61 {Log} — AB (ref <1.30)

## 2013-07-04 LAB — T-HELPER CELL (CD4) - (RCID CLINIC ONLY)
CD4 % Helper T Cell: 32 % — ABNORMAL LOW (ref 33–55)
CD4 T Cell Abs: 830 /uL (ref 400–2700)

## 2013-07-18 ENCOUNTER — Encounter: Payer: Self-pay | Admitting: Internal Medicine

## 2013-07-18 ENCOUNTER — Ambulatory Visit (INDEPENDENT_AMBULATORY_CARE_PROVIDER_SITE_OTHER): Payer: Self-pay | Admitting: Internal Medicine

## 2013-07-18 VITALS — BP 139/98 | HR 82 | Temp 97.6°F | Ht 69.0 in | Wt 170.0 lb

## 2013-07-18 DIAGNOSIS — F319 Bipolar disorder, unspecified: Secondary | ICD-10-CM

## 2013-07-18 DIAGNOSIS — F17200 Nicotine dependence, unspecified, uncomplicated: Secondary | ICD-10-CM | POA: Insufficient documentation

## 2013-07-18 DIAGNOSIS — B20 Human immunodeficiency virus [HIV] disease: Secondary | ICD-10-CM

## 2013-07-18 DIAGNOSIS — A539 Syphilis, unspecified: Secondary | ICD-10-CM

## 2013-07-18 MED ORDER — PENICILLIN G BENZATHINE 1200000 UNIT/2ML IM SUSP
1.2000 10*6.[IU] | Freq: Once | INTRAMUSCULAR | Status: AC
  Administered 2013-07-18: 1.2 10*6.[IU] via INTRAMUSCULAR

## 2013-07-18 NOTE — Addendum Note (Signed)
Addended by: Wendall MolaOCKERHAM, Acelyn Basham A on: 07/18/2013 02:49 PM   Modules accepted: Orders

## 2013-07-18 NOTE — Assessment & Plan Note (Signed)
Recent contact again so will treat today with bicillin x 1

## 2013-07-18 NOTE — Assessment & Plan Note (Signed)
Stable, though does hear voices.  [facility] and to [facility] our counselor as well.

## 2013-07-18 NOTE — Progress Notes (Signed)
  Subjective:    Patient ID: Blake Chambers, male    DOB: 09-24-1990, 23 y.o.   MRN: [id number]  HPI  He comes in for followup of HIV. He was started on Prezista, Norvir and Truvada he was on last year but then stopped. He then came back earlier this year and restarted his medications and had good response and then switched to Epzicom with Prezista and ritonavir after having renal insufficiency on Truvada. His creatinine is now returned to normal and he continues to have excellent compliance with no missed doses. Now in his own place.  Stopped his mental health medicines but is working with bridge counseling to get [facility].  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     Review of Systems  Constitutional: Negative for fever and fatigue.  HENT: Negative for sore throat and trouble swallowing.   Respiratory: Negative for shortness of breath.   Cardiovascular: Negative for leg swelling.  Gastrointestinal: Negative for nausea and diarrhea.  Endocrine: Negative for polydipsia and polyuria.  Genitourinary: Negative for dysuria, frequency and difficulty urinating.  Musculoskeletal: Negative for arthralgias, joint swelling and myalgias.  Skin: Negative for rash.  Neurological: Negative for dizziness, light-headedness and headaches.  Hematological: Negative for adenopathy.  Psychiatric/Behavioral: Negative for dysphoric mood.       Objective:   Physical Exam  Constitutional: He appears well-developed and well-nourished. No distress.  HENT:  Mouth/Throat: Oropharynx is clear and moist. No oropharyngeal exudate.  Eyes: No scleral icterus.  Cardiovascular: Normal rate, regular rhythm and normal heart sounds.   No murmur heard. Pulmonary/Chest: Effort normal and breath sounds normal. No respiratory distress. He has no wheezes.  Lymphadenopathy:    He has no cervical adenopathy.          Assessment & Plan:

## 2013-07-18 NOTE — Assessment & Plan Note (Signed)
Doing well and continue with same regimen.  Check hep b labs next visit for immunity.  RTC 3 months

## 2013-07-18 NOTE — Assessment & Plan Note (Signed)
Discussed cessation.  Given info for [place] quit line.

## 2013-07-21 ENCOUNTER — Encounter: Payer: Self-pay | Admitting: Licensed Clinical Social Worker

## 2013-07-21 ENCOUNTER — Other Ambulatory Visit: Payer: Self-pay | Admitting: Licensed Clinical Social Worker

## 2013-08-09 ENCOUNTER — Encounter: Payer: Self-pay | Admitting: *Deleted

## 2013-08-19 ENCOUNTER — Other Ambulatory Visit: Payer: Self-pay | Admitting: Licensed Clinical Social Worker

## 2013-08-19 ENCOUNTER — Other Ambulatory Visit: Payer: Self-pay | Admitting: *Deleted

## 2013-08-19 MED ORDER — ABACAVIR SULFATE-LAMIVUDINE 600-300 MG PO TABS: 1.0000 | ORAL_TABLET | Freq: Every day | ORAL | Status: DC

## 2013-08-19 MED ORDER — DARUNAVIR ETHANOLATE 800 MG PO TABS: 800.0000 mg | ORAL_TABLET | Freq: Every day | ORAL | Status: DC

## 2013-08-19 MED ORDER — RITONAVIR 100 MG PO TABS: 100.0000 mg | ORAL_TABLET | Freq: Every day | ORAL | Status: DC

## 2013-08-19 NOTE — Progress Notes (Signed)
ADAP renewal 

## 2013-10-04 ENCOUNTER — Other Ambulatory Visit: Payer: Self-pay

## 2013-10-10 ENCOUNTER — Other Ambulatory Visit (INDEPENDENT_AMBULATORY_CARE_PROVIDER_SITE_OTHER): Payer: Self-pay

## 2013-10-10 ENCOUNTER — Other Ambulatory Visit: Payer: Self-pay | Admitting: Internal Medicine

## 2013-10-10 DIAGNOSIS — Z79899 Other long term (current) drug therapy: Secondary | ICD-10-CM

## 2013-10-10 DIAGNOSIS — B20 Human immunodeficiency virus [HIV] disease: Secondary | ICD-10-CM

## 2013-10-10 LAB — CBC WITH DIFFERENTIAL/PLATELET
Basophils Absolute: 0 10*3/uL (ref 0.0–0.1)
Basophils Relative: 0 % (ref 0–1)
Eosinophils Absolute: 0.2 10*3/uL (ref 0.0–0.7)
Eosinophils Relative: 2 % (ref 0–5)
HCT: 45.8 % (ref 39.0–52.0)
Hemoglobin: 15.5 g/dL (ref 13.0–17.0)
Lymphocytes Relative: 30 % (ref 12–46)
Lymphs Abs: 2.3 10*3/uL (ref 0.7–4.0)
MCH: 26.9 pg (ref 26.0–34.0)
MCHC: 33.8 g/dL (ref 30.0–36.0)
MCV: 79.4 fL (ref 78.0–100.0)
Monocytes Absolute: 0.5 10*3/uL (ref 0.1–1.0)
Monocytes Relative: 6 % (ref 3–12)
NEUTROS ABS: 4.8 10*3/uL (ref 1.7–7.7)
NEUTROS PCT: 62 % (ref 43–77)
PLATELETS: 223 10*3/uL (ref 150–400)
RBC: 5.77 MIL/uL (ref 4.22–5.81)
RDW: 16.6 % — ABNORMAL HIGH (ref 11.5–15.5)
WBC: 7.8 10*3/uL (ref 4.0–10.5)

## 2013-10-11 LAB — HIV-1 RNA QUANT-NO REFLEX-BLD
HIV 1 RNA QUANT: 678 {copies}/mL — AB (ref <20)
HIV-1 RNA QUANT, LOG: 2.83 {Log} — AB (ref <1.30)

## 2013-10-11 LAB — LIPID PANEL
Cholesterol: 156 mg/dL (ref 0–200)
HDL: 58 mg/dL (ref >39)
LDL Cholesterol: 84 mg/dL (ref 0–99)
TRIGLYCERIDES: 68 mg/dL (ref <150)
Total CHOL/HDL Ratio: 2.7 Ratio
VLDL: 14 mg/dL (ref 0–40)

## 2013-10-11 LAB — COMPLETE METABOLIC PANEL WITH GFR
ALBUMIN: 4.7 g/dL (ref 3.5–5.2)
ALK PHOS: 69 U/L (ref 39–117)
ALT: 17 U/L (ref 0–53)
AST: 19 U/L (ref 0–37)
BILIRUBIN TOTAL: 0.6 mg/dL (ref 0.2–1.2)
BUN: 14 mg/dL (ref 6–23)
CO2: 27 mEq/L (ref 19–32)
Calcium: 9.7 mg/dL (ref 8.4–10.5)
Chloride: 102 mEq/L (ref 96–112)
Creat: 1.31 mg/dL (ref 0.50–1.35)
GFR, Est African American: 88 mL/min
GFR, Est Non African American: 76 mL/min
Glucose, Bld: 89 mg/dL (ref 70–99)
POTASSIUM: 4.2 meq/L (ref 3.5–5.3)
Sodium: 140 mEq/L (ref 135–145)
Total Protein: 7.3 g/dL (ref 6.0–8.3)

## 2013-10-11 LAB — HEPATITIS B SURFACE ANTIBODY,QUALITATIVE: HEP B S AB: POSITIVE — AB

## 2013-10-11 LAB — T-HELPER CELL (CD4) - (RCID CLINIC ONLY)
CD4 % Helper T Cell: 34 % (ref 33–55)
CD4 T CELL ABS: 820 /uL (ref 400–2700)

## 2013-10-13 ENCOUNTER — Other Ambulatory Visit: Payer: Self-pay | Admitting: Internal Medicine

## 2013-10-13 DIAGNOSIS — B20 Human immunodeficiency virus [HIV] disease: Secondary | ICD-10-CM

## 2013-10-20 ENCOUNTER — Ambulatory Visit: Payer: Self-pay | Admitting: Internal Medicine

## 2013-10-26 LAB — HIV-1 GENOTYPR PLUS

## 2013-11-10 ENCOUNTER — Ambulatory Visit (INDEPENDENT_AMBULATORY_CARE_PROVIDER_SITE_OTHER): Payer: Self-pay | Admitting: Internal Medicine

## 2013-11-10 ENCOUNTER — Encounter: Payer: Self-pay | Admitting: Internal Medicine

## 2013-11-10 VITALS — BP 126/85 | HR 71 | Temp 97.9°F | Ht 69.0 in | Wt 157.0 lb

## 2013-11-10 DIAGNOSIS — B20 Human immunodeficiency virus [HIV] disease: Secondary | ICD-10-CM

## 2013-11-10 DIAGNOSIS — F17200 Nicotine dependence, unspecified, uncomplicated: Secondary | ICD-10-CM

## 2013-11-10 NOTE — Assessment & Plan Note (Signed)
I encouraged cessation.  He is reducing the amount.

## 2013-11-10 NOTE — Progress Notes (Signed)
  Subjective:    Patient ID: Blake Chambers, male    DOB: 1991-03-07, 23 y.o.   MRN: [id number]  HPI  He comes in for followup of HIV. He was started on Prezista, Norvir and Truvada he was on last year but then stopped. He then came back earlier this year and restarted his medications and had good response and then switched to Epzicom with Prezista and ritonavir after having renal insufficiency on Truvada. His creatinine is now returned to normal and he continues to have excellent compliance with no missed doses. Now in his own place.  Now back on his psychiatry medications and doing well. Had missed some doses and viral load done last month up to 678.  Now taking everyday without missed doses.     Review of Systems  Constitutional: Negative for fever and fatigue.  HENT: Negative for sore throat and trouble swallowing.   Respiratory: Negative for shortness of breath.   Cardiovascular: Negative for leg swelling.  Gastrointestinal: Negative for nausea and diarrhea.  Endocrine: Negative for polydipsia and polyuria.  Genitourinary: Negative for dysuria, frequency and difficulty urinating.  Musculoskeletal: Negative for arthralgias, joint swelling and myalgias.  Skin: Negative for rash.  Neurological: Negative for dizziness, light-headedness and headaches.  Hematological: Negative for adenopathy.  Psychiatric/Behavioral: Negative for dysphoric mood.       Objective:   Physical Exam  Constitutional: He appears well-developed and well-nourished. No distress.  HENT:  Mouth/Throat: Oropharynx is clear and moist. No oropharyngeal exudate.  Eyes: No scleral icterus.  Cardiovascular: Normal rate, regular rhythm and normal heart sounds.   No murmur heard. Pulmonary/Chest: Effort normal and breath sounds normal. No respiratory distress. He has no wheezes.  Lymphadenopathy:    He has no cervical adenopathy.          Assessment & Plan:

## 2013-11-10 NOTE — Assessment & Plan Note (Signed)
I am concerned with resistance development with sporadic compliance, though he is now doing well.  I will check viral load with genotype today to be sure no resistance has developed.  If ok, rtc 3 months.

## 2013-11-16 ENCOUNTER — Other Ambulatory Visit (INDEPENDENT_AMBULATORY_CARE_PROVIDER_SITE_OTHER): Payer: Self-pay

## 2013-11-16 DIAGNOSIS — Z113 Encounter for screening for infections with a predominantly sexual mode of transmission: Secondary | ICD-10-CM

## 2013-11-16 DIAGNOSIS — B20 Human immunodeficiency virus [HIV] disease: Secondary | ICD-10-CM

## 2013-11-16 LAB — CBC WITH DIFFERENTIAL/PLATELET
BASOS PCT: 0 % (ref 0–1)
Basophils Absolute: 0 10*3/uL (ref 0.0–0.1)
EOS ABS: 0.2 10*3/uL (ref 0.0–0.7)
EOS PCT: 3 % (ref 0–5)
HEMATOCRIT: 43.5 % (ref 39.0–52.0)
Hemoglobin: 14.6 g/dL (ref 13.0–17.0)
LYMPHS PCT: 35 % (ref 12–46)
Lymphs Abs: 2.1 10*3/uL (ref 0.7–4.0)
MCH: 27 pg (ref 26.0–34.0)
MCHC: 33.6 g/dL (ref 30.0–36.0)
MCV: 80.6 fL (ref 78.0–100.0)
MONO ABS: 0.5 10*3/uL (ref 0.1–1.0)
Monocytes Relative: 8 % (ref 3–12)
Neutro Abs: 3.3 10*3/uL (ref 1.7–7.7)
Neutrophils Relative %: 54 % (ref 43–77)
PLATELETS: 274 10*3/uL (ref 150–400)
RBC: 5.4 MIL/uL (ref 4.22–5.81)
RDW: 16.3 % — ABNORMAL HIGH (ref 11.5–15.5)
WBC: 6.1 10*3/uL (ref 4.0–10.5)

## 2013-11-16 LAB — COMPLETE METABOLIC PANEL WITH GFR
ALBUMIN: 4.2 g/dL (ref 3.5–5.2)
ALK PHOS: 61 U/L (ref 39–117)
ALT: 18 U/L (ref 0–53)
AST: 30 U/L (ref 0–37)
BUN: 11 mg/dL (ref 6–23)
CALCIUM: 9.2 mg/dL (ref 8.4–10.5)
CHLORIDE: 105 meq/L (ref 96–112)
CO2: 28 mEq/L (ref 19–32)
Creat: 1.35 mg/dL (ref 0.50–1.35)
GFR, Est African American: 85 mL/min
GFR, Est Non African American: 73 mL/min
Glucose, Bld: 86 mg/dL (ref 70–99)
POTASSIUM: 4.1 meq/L (ref 3.5–5.3)
Sodium: 140 mEq/L (ref 135–145)
Total Bilirubin: 0.5 mg/dL (ref 0.2–1.2)
Total Protein: 6.8 g/dL (ref 6.0–8.3)

## 2013-11-17 LAB — RPR: RPR Ser Ql: REACTIVE — AB

## 2013-11-17 LAB — HIV-1 RNA QUANT-NO REFLEX-BLD
HIV 1 RNA Quant: 93 copies/mL — ABNORMAL HIGH (ref <20)
HIV-1 RNA QUANT, LOG: 1.97 {Log} — AB (ref <1.30)

## 2013-11-17 LAB — T.PALLIDUM AB, TOTAL: T pallidum Antibodies (TP-PA): >8 S/CO — ABNORMAL HIGH (ref <0.90)

## 2013-11-17 LAB — T-HELPER CELL (CD4) - (RCID CLINIC ONLY)
CD4 % Helper T Cell: 38 % (ref 33–55)
CD4 T Cell Abs: 830 /uL (ref 400–2700)

## 2013-11-17 LAB — RPR TITER

## 2013-12-26 ENCOUNTER — Other Ambulatory Visit: Payer: Self-pay | Admitting: *Deleted

## 2013-12-26 MED ORDER — ABACAVIR SULFATE-LAMIVUDINE 600-300 MG PO TABS: 1.0000 | ORAL_TABLET | Freq: Every day | ORAL | Status: DC

## 2013-12-26 MED ORDER — RITONAVIR 100 MG PO TABS: 100.0000 mg | ORAL_TABLET | Freq: Every day | ORAL | Status: DC

## 2013-12-26 MED ORDER — DARUNAVIR ETHANOLATE 800 MG PO TABS: 800.0000 mg | ORAL_TABLET | Freq: Every day | ORAL | Status: DC

## 2014-01-07 ENCOUNTER — Other Ambulatory Visit: Payer: Self-pay | Admitting: Internal Medicine

## 2014-01-09 ENCOUNTER — Other Ambulatory Visit: Payer: Self-pay | Admitting: Licensed Clinical Social Worker

## 2014-01-09 MED ORDER — RITONAVIR 100 MG PO TABS: 100.0000 mg | ORAL_TABLET | Freq: Every day | ORAL | Status: DC

## 2014-01-16 ENCOUNTER — Ambulatory Visit: Payer: Self-pay

## 2014-01-26 ENCOUNTER — Other Ambulatory Visit (INDEPENDENT_AMBULATORY_CARE_PROVIDER_SITE_OTHER): Payer: Self-pay

## 2014-01-26 DIAGNOSIS — B20 Human immunodeficiency virus [HIV] disease: Secondary | ICD-10-CM

## 2014-01-27 LAB — T-HELPER CELL (CD4) - (RCID CLINIC ONLY)
CD4 T CELL ABS: 1070 /uL (ref 400–2700)
CD4 T CELL HELPER: 36 % (ref 33–55)

## 2014-01-30 LAB — HIV-1 RNA QUANT-NO REFLEX-BLD
HIV 1 RNA Quant: 737 copies/mL — ABNORMAL HIGH (ref <20)
HIV-1 RNA QUANT, LOG: 2.87 {Log} — AB (ref <1.30)

## 2014-02-09 ENCOUNTER — Ambulatory Visit: Payer: Self-pay | Admitting: Internal Medicine

## 2014-03-16 ENCOUNTER — Ambulatory Visit: Payer: Self-pay

## 2014-03-31 ENCOUNTER — Other Ambulatory Visit: Payer: Self-pay | Admitting: Internal Medicine

## 2014-03-31 DIAGNOSIS — B20 Human immunodeficiency virus [HIV] disease: Secondary | ICD-10-CM

## 2014-04-04 ENCOUNTER — Ambulatory Visit: Payer: Self-pay | Admitting: Internal Medicine

## 2014-05-16 ENCOUNTER — Ambulatory Visit: Payer: Self-pay | Admitting: Internal Medicine

## 2014-05-19 ENCOUNTER — Emergency Department (HOSPITAL_COMMUNITY)
Admission: EM | Admit: 2014-05-19 | Discharge: 2014-05-19 | Disposition: A | Discharge status: Home / Self Care | Payer: Medicaid Other | Attending: Emergency Medicine | Admitting: Emergency Medicine

## 2014-05-19 ENCOUNTER — Other Ambulatory Visit: Payer: Self-pay

## 2014-05-19 ENCOUNTER — Encounter (HOSPITAL_COMMUNITY): Payer: Self-pay | Admitting: Emergency Medicine

## 2014-05-19 DIAGNOSIS — Z21 Asymptomatic human immunodeficiency virus [HIV] infection status: Secondary | ICD-10-CM | POA: Diagnosis not present

## 2014-05-19 DIAGNOSIS — F319 Bipolar disorder, unspecified: Secondary | ICD-10-CM | POA: Diagnosis not present

## 2014-05-19 DIAGNOSIS — Z79899 Other long term (current) drug therapy: Secondary | ICD-10-CM | POA: Diagnosis not present

## 2014-05-19 DIAGNOSIS — R531 Weakness: Secondary | ICD-10-CM | POA: Diagnosis present

## 2014-05-19 DIAGNOSIS — Z720 Tobacco use: Secondary | ICD-10-CM | POA: Insufficient documentation

## 2014-05-19 DIAGNOSIS — R55 Syncope and collapse: Secondary | ICD-10-CM

## 2014-05-19 DIAGNOSIS — F419 Anxiety disorder, unspecified: Secondary | ICD-10-CM | POA: Insufficient documentation

## 2014-05-19 DIAGNOSIS — F10120 Alcohol abuse with intoxication, uncomplicated: Secondary | ICD-10-CM | POA: Insufficient documentation

## 2014-05-19 DIAGNOSIS — Z862 Personal history of diseases of the blood and blood-forming organs and certain disorders involving the immune mechanism: Secondary | ICD-10-CM | POA: Diagnosis not present

## 2014-05-19 DIAGNOSIS — F10920 Alcohol use, unspecified with intoxication, uncomplicated: Secondary | ICD-10-CM

## 2014-05-19 NOTE — ED Notes (Signed)
patient states he had 4 beers tonight.

## 2014-05-19 NOTE — Discharge Instructions (Signed)
Syncope °Syncope is a medical term for fainting or passing out. This means you lose consciousness and drop to the ground. People are generally unconscious for less than 5 minutes. You may have some muscle twitches for up to 15 seconds before waking up and returning to normal. Syncope occurs more often in older adults, but it can happen to anyone. While most causes of syncope are not dangerous, syncope can be a sign of a serious medical problem. It is important to seek medical care.  °CAUSES  °Syncope is caused by a sudden drop in blood flow to the brain. The specific cause is often not determined. Factors that can bring on syncope include: °· Taking medicines that lower blood pressure. °· Sudden changes in posture, such as standing up quickly. °· Taking more medicine than prescribed. °· Standing in one place for too long. °· Seizure disorders. °· Dehydration and excessive exposure to heat. °· Low blood sugar (hypoglycemia). °· Straining to have a bowel movement. °· Heart disease, irregular heartbeat, or other circulatory problems. °· Fear, emotional distress, seeing blood, or severe pain. °SYMPTOMS  °Right before fainting, you may: °· Feel dizzy or light-headed. °· Feel nauseous. °· See all white or all black in your field of vision. °· Have cold, clammy skin. °DIAGNOSIS  °Your health care provider will ask about your symptoms, perform a physical exam, and perform an electrocardiogram (ECG) to record the electrical activity of your heart. Your health care provider may also perform other heart or blood tests to determine the cause of your syncope which may include: °· Transthoracic echocardiogram (TTE). During echocardiography, sound waves are used to evaluate how blood flows through your heart. °· Transesophageal echocardiogram (TEE). °· Cardiac monitoring. This allows your health care provider to monitor your heart rate and rhythm in real time. °· Holter monitor. This is a portable device that records your  heartbeat and can help diagnose heart arrhythmias. It allows your health care provider to track your heart activity for several days, if needed. °· Stress tests by exercise or by giving medicine that makes the heart beat faster. °TREATMENT  °In most cases, no treatment is needed. Depending on the cause of your syncope, your health care provider may recommend changing or stopping some of your medicines. °HOME CARE INSTRUCTIONS °· Have someone stay with you until you feel stable. °· Do not drive, use machinery, or play sports until your health care provider says it is okay. °· Keep all follow-up appointments as directed by your health care provider. °· Lie down right away if you start feeling like you might faint. Breathe deeply and steadily. Wait until all the symptoms have passed. °· Drink enough fluids to keep your urine clear or pale yellow. °· If you are taking blood pressure or heart medicine, get up slowly and take several minutes to sit and then stand. This can reduce dizziness. °SEEK IMMEDIATE MEDICAL CARE IF:  °· You have a severe headache. °· You have unusual pain in the chest, abdomen, or back. °· You are bleeding from your mouth or rectum, or you have black or tarry stool. °· You have an irregular or very fast heartbeat. °· You have pain with breathing. °· You have repeated fainting or seizure-like jerking during an episode. °· You faint when sitting or lying down. °· You have confusion. °· You have trouble walking. °· You have severe weakness. °· You have vision problems. °If you fainted, call your local emergency services (911 in [place] Do not drive   yourself to the hospital.  °MAKE SURE YOU: °· Understand these instructions. °· Will watch your condition. °· Will get help right away if you are not doing well or get worse. °Document Released: 06/02/2005 Document Revised: 06/07/2013 Document Reviewed: 08/01/2011 °ExitCare® Patient Information ©2015 [employer]. This information is not intended to replace  advice given to you by your health care provider. Make sure you discuss any questions you have with your health care provider. ° °

## 2014-05-19 NOTE — ED Notes (Signed)
Patient states he has not been taking his HIV medication for @1  week, states he has been drinking and having "fun with his friends" and they make him forget. Patient states he was scheduled to start taking a new medication today. Patient states he is here tonight because he passed out in a store. Patient states that he was drinking tonight and he "passed out" and when he woke up he was "woozy" and "I've been outside for a couple of hours." Patient states he does not know why he passed out.

## 2014-05-19 NOTE — ED Notes (Signed)
Campos, MD at bedside.  

## 2014-05-19 NOTE — ED Notes (Signed)
Per EMS, patient was found laying on ground at a gas station. Patient reports to drinking ETOH overnight and using marijuana, patient was allegedly laying on ground for past couple of hours. . Patient reports he has been weak over the past few weeks. Patient has been alert while with EMS, denies SI. 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

## 2014-05-19 NOTE — ED Notes (Signed)
Bed: WA10 Expected date:  Expected time:  Means of arrival:  Comments: EMS 

## 2014-05-19 NOTE — ED Provider Notes (Signed)
CSN: [id number]     Arrival date & time 05/19/14  0610 History   First MD Initiated Contact with Patient 05/19/14 [phone]     Chief Complaint  Patient presents with  . Weakness    HIV+, marijuana and ETOH use tonight      The history is provided by the patient.  pt reports he was drinking ETOH tonight. He went into a store and "passed out". Denies HA. No CP or SOB. No palpitations. No significant complaints at this time. Reports he is not sure if he lost conciousness. No fevers or chills. Eating and drinking normally.  Denies melena or hematochezia.  No use of anticoagulants.  Denies chest pain or chest tightness at this time.  No preceding palpitations.  Past Medical History  Diagnosis Date  . Anxiety   . Mental health disorder   . Immune deficiency disorder   . HIV (human immunodeficiency virus infection)   . Bipolar affective    History reviewed. No pertinent past surgical history. No family history on file. History  Substance Use Topics  . Smoking status: Current Every Day Smoker -- 0.30 packs/day    Types: Cigarettes  . Smokeless tobacco: Never Used     Comment: pt. trying to quit  . Alcohol Use: 0.5 oz/week    1 Not specified per week     Comment: occasional    Review of Systems  Neurological: Positive for weakness.  All other systems reviewed and are negative.     Allergies  Tenofovir  Home Medications   Prior to Admission medications   Medication Sig Start Date End Date Taking? Authorizing Provider  abacavir-lamiVUDine (EPZICOM) 600-300 MG per tablet Take 1 tablet by mouth daily. 12/26/13   Judyann Munsonynthia Snider, MD  ARIPiprazole (ABILIFY) 30 MG tablet Take 30 mg by mouth daily.    Historical Provider, MD  FLUoxetine (PROZAC) 20 MG capsule Take 20 mg by mouth daily.    Historical Provider, MD  NORVIR 100 MG TABS tablet TAKE 1 TABLET BY MOUTH DAILY    Gardiner Barefootobert W Comer, MD  PREZISTA 800 MG tablet TAKE 1 TABLET BY MOUTH DAILY 04/03/14   Gardiner Barefootobert W Comer, MD  ritonavir  (NORVIR) 100 MG TABS tablet Take 1 tablet (100 mg total) by mouth daily. 01/09/14   Gardiner Barefootobert W Comer, MD   BP 121/78 mmHg  Pulse 71  Temp(Src) 98.3 F (36.8 C) (Oral)  Resp 18  SpO2 100% Physical Exam  Constitutional: He is oriented to person, place, and time. He appears well-developed and well-nourished.  HENT:  Head: Normocephalic and atraumatic.  Eyes: EOM are normal.  Neck: Normal range of motion.  Cardiovascular: Normal rate, regular rhythm, normal heart sounds and intact distal pulses.   Pulmonary/Chest: Effort normal and breath sounds normal. No respiratory distress.  Abdominal: Soft. He exhibits no distension. There is no tenderness.  Musculoskeletal: Normal range of motion.  Neurological: He is alert and oriented to person, place, and time.  Skin: Skin is warm and dry.  Psychiatric: He has a normal mood and affect. Judgment normal.  Nursing note and vitals reviewed.   ED Course  Procedures (including critical care time) Labs Review Labs Reviewed - No data to display  Imaging Review No results found.  ECG interpretation   Date: 05/19/2014  Rate: 71  Rhythm: normal sinus rhythm  QRS Axis: normal  Intervals: normal  ST/T Wave abnormalities: Early repolarization pattern  Conduction Disutrbances: none  Narrative Interpretation:   Old EKG Reviewed: no prior ecg  MDM   Final diagnoses:  Alcohol intoxication, uncomplicated  Syncope, unspecified syncope type    Early repolarization EKG.  Likely related to alcohol abuse tonight.  Patient will follow-up with his primary care physician.  Vital signs normal.    Lyanne CoKevin M Brenn Gatton, MD 05/19/14 [phone]

## 2014-05-20 ENCOUNTER — Other Ambulatory Visit: Payer: Self-pay | Admitting: Internal Medicine

## 2014-06-05 ENCOUNTER — Encounter (HOSPITAL_COMMUNITY): Payer: Self-pay | Admitting: *Deleted

## 2014-06-05 ENCOUNTER — Emergency Department (HOSPITAL_COMMUNITY)
Admission: EM | Admit: 2014-06-05 | Discharge: 2014-06-06 | Disposition: A | Discharge status: Home / Self Care | Payer: Medicaid Other | Attending: Emergency Medicine | Admitting: Emergency Medicine

## 2014-06-05 DIAGNOSIS — Z862 Personal history of diseases of the blood and blood-forming organs and certain disorders involving the immune mechanism: Secondary | ICD-10-CM | POA: Diagnosis not present

## 2014-06-05 DIAGNOSIS — J029 Acute pharyngitis, unspecified: Secondary | ICD-10-CM

## 2014-06-05 DIAGNOSIS — Z720 Tobacco use: Secondary | ICD-10-CM | POA: Insufficient documentation

## 2014-06-05 DIAGNOSIS — F419 Anxiety disorder, unspecified: Secondary | ICD-10-CM | POA: Insufficient documentation

## 2014-06-05 DIAGNOSIS — Z79899 Other long term (current) drug therapy: Secondary | ICD-10-CM | POA: Diagnosis not present

## 2014-06-05 DIAGNOSIS — F319 Bipolar disorder, unspecified: Secondary | ICD-10-CM | POA: Insufficient documentation

## 2014-06-05 DIAGNOSIS — Z21 Asymptomatic human immunodeficiency virus [HIV] infection status: Secondary | ICD-10-CM | POA: Diagnosis not present

## 2014-06-05 MED ORDER — ALBUTEROL SULFATE HFA 108 (90 BASE) MCG/ACT IN AERS
2.0000 | INHALATION_SPRAY | Freq: Once | RESPIRATORY_TRACT | Status: AC
  Administered 2014-06-06: 2 via RESPIRATORY_TRACT
  Filled 2014-06-05: qty 6.7

## 2014-06-05 NOTE — ED Notes (Signed)
The pt has had a sorehtroat for several days and he feels hot and he has also had some chest congestion

## 2014-06-05 NOTE — ED Provider Notes (Signed)
CSN: [id number]     Arrival date & time 06/05/14  2225 History  This chart was scribed for non-physician practitioner Rendon Howell working with Dione Boozeavid Glick, MD by Elveria Risingimelie Horne, ED Scribe. This patient was seen in room TR07C/TR07C and the patient's care was started at 11:22 PM.   Chief Complaint  Patient presents with  . Sore Throat   The history is provided by the patient. No language interpreter was used.   HPI Comments: Blake Chambers is a 23 y.o. male who presents to the Emergency Department complaining of sore throat for one week now. Patient reports low grade fevers at home; maximum measured at 100.75F.  Patient presents with hoarse voice and reports sharp throat pain with swallowing solids; patient states that he is able to tolerate liquids.  Patient denies production of sputum but reports occasional dry cough. Patient reports treatment with Tylenol and Aleve, but denies relief. Patient denies history of Strep.   Pt reports he is taking all of his home medications.   Per chart review last CD4 count was 1070.  PCP Staci Righterobert Comer.   Past Medical History  Diagnosis Date  . Anxiety   . Mental health disorder   . Immune deficiency disorder   . HIV (human immunodeficiency virus infection)   . Bipolar affective    History reviewed. No pertinent past surgical history. No family history on file. History  Substance Use Topics  . Smoking status: Current Every Day Smoker -- 0.30 packs/day    Types: Cigarettes  . Smokeless tobacco: Never Used     Comment: pt. trying to quit  . Alcohol Use: 0.5 oz/week    1 Not specified per week     Comment: occasional    Review of Systems  Constitutional: Negative for fever and chills.  HENT: Positive for congestion, rhinorrhea, sore throat and voice change. Negative for trouble swallowing.   Respiratory: Positive for cough. Negative for shortness of breath.   Cardiovascular: Negative for chest pain.  Gastrointestinal: Negative for abdominal pain.   Musculoskeletal: Negative for myalgias.   Allergies  Tenofovir  Home Medications   Prior to Admission medications   Medication Sig Start Date End Date Taking? Authorizing Provider  abacavir-lamiVUDine (EPZICOM) 600-300 MG per tablet Take 1 tablet by mouth daily. 12/26/13   Judyann Munsonynthia Snider, MD  ARIPiprazole (ABILIFY) 30 MG tablet Take 30 mg by mouth daily.    Historical Provider, MD  FLUoxetine (PROZAC) 20 MG capsule Take 20 mg by mouth daily.    Historical Provider, MD  NORVIR 100 MG TABS tablet TAKE 1 TABLET BY MOUTH DAILY    Gardiner Barefootobert W Comer, MD  PREZISTA 800 MG tablet TAKE 1 TABLET BY MOUTH DAILY 04/03/14   Gardiner Barefootobert W Comer, MD  ritonavir (NORVIR) 100 MG TABS tablet Take 1 tablet (100 mg total) by mouth daily. 01/09/14   Gardiner Barefootobert W Comer, MD   Triage Vitals: BP 122/94 mmHg  Pulse 97  Temp(Src) 98.1 F (36.7 C)  Resp 20  SpO2 96% Physical Exam  Constitutional: He appears well-developed and well-nourished. No distress.  HENT:  Head: Normocephalic and atraumatic.  Mouth/Throat: Mucous membranes are not dry. Posterior oropharyngeal edema and posterior oropharyngeal erythema present. No oropharyngeal exudate or tonsillar abscesses.  No paratracheal tenderness. Edema and erythema, no exudate.  Eyes: Conjunctivae are normal.  Neck: Normal range of motion. Neck supple.  Cardiovascular: Normal rate and regular rhythm.   Pulmonary/Chest: Effort normal. No stridor. He has wheezes. He has no rhonchi. He has no rales.  Mild diffuse wheezing  Lymphadenopathy:    He has no cervical adenopathy.  Neurological: He is alert.  Skin: He is not diaphoretic.  Psychiatric: He has a normal mood and affect. His behavior is normal.  Nursing note and vitals reviewed.   ED Course  Procedures (including critical care time)  COORDINATION OF CARE: 11:22 PM- Discussed treatment plan with patient at bedside and patient agreed to plan.   Labs Review Labs Reviewed  RAPID STREP SCREEN  CULTURE, GROUP A  STREP    Imaging Review No results found.   EKG Interpretation None       12:25 AM Lungs CTAB.  Pt denies any SOB.  He does have transmitted upper respiratory sounds.    MDM   Final diagnoses:  Pharyngitis    Afebrile, nontoxic patient with sore throat, URI symptoms.  Strep is negative.  CD4 count was last 1070 and pt reports taking his medications.   D/C home with albuterol, hycet elixir, PCP follow up.  Discussed return precautions.   Discussed result, findings, treatment, and follow up  with patient.  Pt given return precautions.  Pt verbalizes understanding and agrees with plan.       I personally performed the services described in this documentation, which was scribed in my presence. The recorded information has been reviewed and is accurate.    Trixie Dredgemily Jaxon Mynhier, PA-C 06/06/14 [id number]  Dione Boozeavid Glick, MD 06/06/14 [phone]

## 2014-06-06 LAB — RAPID STREP SCREEN (MED CTR MEBANE ONLY): Streptococcus, Group A Screen (Direct): NEGATIVE

## 2014-06-06 MED ORDER — HYDROCODONE-ACETAMINOPHEN 7.5-325 MG/15ML PO SOLN
10.0000 mL | ORAL | Status: AC
  Administered 2014-06-06: 10 mL via ORAL
  Filled 2014-06-06: qty 15

## 2014-06-06 MED ORDER — HYDROCODONE-ACETAMINOPHEN 7.5-325 MG/15ML PO SOLN: 10.0000 mL | Freq: Four times a day (QID) | ORAL | Status: DC | PRN

## 2014-06-06 NOTE — Discharge Instructions (Signed)
Read the information below.  Use the prescribed medication as directed.  Please discuss all new medications with your pharmacist.  Do not take additional tylenol while taking the prescribed pain medication to avoid overdose.  You may return to the Emergency Department at any time for worsening condition or any new symptoms that concern you.    If you develop high fevers, difficulty swallowing or breathing, or you are unable to tolerate fluids by mouth, return to the ER immediately for a recheck.    ° ° °Pharyngitis °Pharyngitis is a sore throat (pharynx). There is redness, pain, and swelling of your throat. °HOME CARE  °· Drink enough fluids to keep your pee (urine) clear or pale yellow. °· Only take medicine as told by your doctor. °¨ You may get sick again if you do not take medicine as told. Finish your medicines, even if you start to feel better. °¨ Do not take aspirin. °· Rest. °· Rinse your mouth (gargle) with salt water (½ tsp of salt per 1 qt of water) every 1-2 hours. This will help the pain. °· If you are not at risk for choking, you can suck on hard candy or sore throat lozenges. °GET HELP IF: °· You have large, tender lumps on your neck. °· You have a rash. °· You cough up green, yellow-brown, or bloody spit. °GET HELP RIGHT AWAY IF:  °· You have a stiff neck. °· You drool or cannot swallow liquids. °· You throw up (vomit) or are not able to keep medicine or liquids down. °· You have very bad pain that does not go away with medicine. °· You have problems breathing (not from a stuffy nose). °MAKE SURE YOU:  °· Understand these instructions. °· Will watch your condition. °· Will get help right away if you are not doing well or get worse. °Document Released: 11/19/2007 Document Revised: 03/23/2013 Document Reviewed: 02/07/2013 °ExitCare® Patient Information ©2015 [employer]. This information is not intended to replace advice given to you by your health care provider. Make sure you discuss any questions  you have with your health care provider. ° °

## 2014-06-08 LAB — CULTURE, GROUP A STREP

## 2014-06-10 ENCOUNTER — Encounter (HOSPITAL_COMMUNITY): Payer: Self-pay | Admitting: Emergency Medicine

## 2014-06-10 ENCOUNTER — Emergency Department (HOSPITAL_COMMUNITY): Payer: Medicaid Other

## 2014-06-10 ENCOUNTER — Emergency Department (HOSPITAL_COMMUNITY)
Admission: EM | Admit: 2014-06-10 | Discharge: 2014-06-10 | Discharge status: Against Medical Advice | Payer: Medicaid Other | Attending: Emergency Medicine | Admitting: Emergency Medicine

## 2014-06-10 DIAGNOSIS — T1490XA Injury, unspecified, initial encounter: Secondary | ICD-10-CM

## 2014-06-10 DIAGNOSIS — Y9289 Other specified places as the place of occurrence of the external cause: Secondary | ICD-10-CM | POA: Diagnosis not present

## 2014-06-10 DIAGNOSIS — Y9389 Activity, other specified: Secondary | ICD-10-CM | POA: Diagnosis not present

## 2014-06-10 DIAGNOSIS — X58XXXA Exposure to other specified factors, initial encounter: Secondary | ICD-10-CM | POA: Diagnosis not present

## 2014-06-10 DIAGNOSIS — Y998 Other external cause status: Secondary | ICD-10-CM | POA: Diagnosis not present

## 2014-06-10 DIAGNOSIS — S99921A Unspecified injury of right foot, initial encounter: Secondary | ICD-10-CM | POA: Diagnosis not present

## 2014-06-10 DIAGNOSIS — Z720 Tobacco use: Secondary | ICD-10-CM | POA: Insufficient documentation

## 2014-06-10 DIAGNOSIS — S99911A Unspecified injury of right ankle, initial encounter: Secondary | ICD-10-CM | POA: Insufficient documentation

## 2014-06-10 DIAGNOSIS — M25572 Pain in left ankle and joints of left foot: Secondary | ICD-10-CM

## 2014-06-10 NOTE — ED Provider Notes (Signed)
CSN: [id number]     Arrival date & time 06/10/14  [id number] History   First MD Initiated Contact with Patient 06/10/14 [phone]     Chief Complaint  Patient presents with  . Ankle Pain     (Consider location/radiation/quality/duration/timing/severity/associated sxs/prior Treatment) HPI  Past Medical History  Diagnosis Date  . Anxiety   . Mental health disorder   . Immune deficiency disorder   . HIV (human immunodeficiency virus infection)   . Bipolar affective    History reviewed. No pertinent past surgical history. No family history on file. History  Substance Use Topics  . Smoking status: Current Every Day Smoker -- 0.30 packs/day    Types: Cigarettes  . Smokeless tobacco: Never Used     Comment: pt. trying to quit  . Alcohol Use: 0.5 oz/week    1 Not specified per week     Comment: occasional    Review of Systems    Allergies  Tenofovir  Home Medications   Prior to Admission medications   Medication Sig Start Date End Date Taking? Authorizing Provider  abacavir-lamiVUDine (EPZICOM) 600-300 MG per tablet Take 1 tablet by mouth daily. 12/26/13   Judyann Munsonynthia Snider, MD  ARIPiprazole (ABILIFY) 30 MG tablet Take 30 mg by mouth daily.    Historical Provider, MD  FLUoxetine (PROZAC) 20 MG capsule Take 20 mg by mouth daily.    Historical Provider, MD  HYDROcodone-acetaminophen (HYCET) 7.5-325 mg/15 ml solution Take 10 mLs by mouth 4 (four) times daily as needed for moderate pain or severe pain. 06/06/14   Trixie DredgeEmily West, PA-C  NORVIR 100 MG TABS tablet TAKE 1 TABLET BY MOUTH DAILY    Gardiner Barefootobert W Comer, MD  PREZISTA 800 MG tablet TAKE 1 TABLET BY MOUTH DAILY 04/03/14   Gardiner Barefootobert W Comer, MD  ritonavir (NORVIR) 100 MG TABS tablet Take 1 tablet (100 mg total) by mouth daily. 01/09/14   Gardiner Barefootobert W Comer, MD   BP 120/70 mmHg  Pulse 72  Temp(Src) 97.6 F (36.4 C) (Oral)  Resp 20  SpO2 99% Physical Exam  ED Course  Procedures (including critical care time) Labs Review Labs Reviewed - No data  to display  Imaging Review Dg Ankle Complete Right  06/10/2014   CLINICAL DATA:  Status post fall, with twisting injury to right foot. Medial right ankle pain. Initial encounter.  EXAM: RIGHT ANKLE - COMPLETE 3+ VIEW  COMPARISON:  None.  FINDINGS: There is no evidence of fracture or dislocation. The ankle mortise is intact; the interosseous space is within normal limits. No talar tilt or subluxation is seen.  The joint spaces are preserved. No significant soft tissue abnormalities are seen.  IMPRESSION: No evidence of fracture or dislocation.   Electronically Signed   By: Roanna RaiderJeffery  Chang M.D.   On: 06/10/2014 04:17   Dg Foot Complete Right  06/10/2014   CLINICAL DATA:  Status post fall; twisting injury to the right foot, with medial right foot pain. Initial encounter.  EXAM: RIGHT FOOT COMPLETE - 3+ VIEW  COMPARISON:  None.  FINDINGS: There is no evidence of fracture or dislocation. The joint spaces are preserved. There is no evidence of talar subluxation; the subtalar joint is unremarkable in appearance.  No significant soft tissue abnormalities are seen.  IMPRESSION: No evidence of fracture or dislocation.   Electronically Signed   By: Roanna RaiderJeffery  Chang M.D.   On: 06/10/2014 04:17     EKG Interpretation None      MDM  Not seen Left after xray  Final diagnoses:  Ankle pain, left         Arman FilterGail K Kikuye Korenek, NP 06/10/14 [id number]  Arman FilterGail K Corbin Hott, NP 07/04/14 Babette 1960  Ward GivensIva L Knapp, MD 07/04/14 [phone]
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
## 2014-06-10 NOTE — ED Notes (Signed)
Patient left with being seen.

## 2014-07-20 ENCOUNTER — Encounter (HOSPITAL_COMMUNITY): Payer: Self-pay

## 2014-07-20 ENCOUNTER — Emergency Department (HOSPITAL_COMMUNITY)
Admission: EM | Admit: 2014-07-20 | Discharge: 2014-07-20 | Discharge status: Against Medical Advice | Payer: Medicaid Other | Attending: Emergency Medicine | Admitting: Emergency Medicine

## 2014-07-20 ENCOUNTER — Emergency Department (HOSPITAL_COMMUNITY): Payer: Medicaid Other

## 2014-07-20 DIAGNOSIS — R0602 Shortness of breath: Secondary | ICD-10-CM | POA: Diagnosis not present

## 2014-07-20 DIAGNOSIS — R079 Chest pain, unspecified: Secondary | ICD-10-CM | POA: Diagnosis not present

## 2014-07-20 DIAGNOSIS — Z79899 Other long term (current) drug therapy: Secondary | ICD-10-CM | POA: Insufficient documentation

## 2014-07-20 DIAGNOSIS — F319 Bipolar disorder, unspecified: Secondary | ICD-10-CM | POA: Insufficient documentation

## 2014-07-20 DIAGNOSIS — F419 Anxiety disorder, unspecified: Secondary | ICD-10-CM | POA: Diagnosis not present

## 2014-07-20 DIAGNOSIS — R5383 Other fatigue: Secondary | ICD-10-CM | POA: Diagnosis not present

## 2014-07-20 DIAGNOSIS — M79601 Pain in right arm: Secondary | ICD-10-CM | POA: Insufficient documentation

## 2014-07-20 DIAGNOSIS — Z720 Tobacco use: Secondary | ICD-10-CM | POA: Insufficient documentation

## 2014-07-20 DIAGNOSIS — Z21 Asymptomatic human immunodeficiency virus [HIV] infection status: Secondary | ICD-10-CM | POA: Diagnosis not present

## 2014-07-20 DIAGNOSIS — R51 Headache: Secondary | ICD-10-CM | POA: Diagnosis not present

## 2014-07-20 DIAGNOSIS — Z862 Personal history of diseases of the blood and blood-forming organs and certain disorders involving the immune mechanism: Secondary | ICD-10-CM | POA: Diagnosis not present

## 2014-07-20 LAB — CBC
HEMATOCRIT: 39.9 % (ref 39.0–52.0)
HEMOGLOBIN: 13.5 g/dL (ref 13.0–17.0)
MCH: 26.9 pg (ref 26.0–34.0)
MCHC: 33.8 g/dL (ref 30.0–36.0)
MCV: 79.5 fL (ref 78.0–100.0)
PLATELETS: 188 10*3/uL (ref 150–400)
RBC: 5.02 MIL/uL (ref 4.22–5.81)
RDW: 15.9 % — ABNORMAL HIGH (ref 11.5–15.5)
WBC: 16.1 10*3/uL — AB (ref 4.0–10.5)

## 2014-07-20 LAB — D-DIMER, QUANTITATIVE: D-Dimer, Quant: <0.27 ug/mL-FEU (ref 0.00–0.48)

## 2014-07-20 LAB — URINALYSIS, ROUTINE W REFLEX MICROSCOPIC
Glucose, UA: NEGATIVE mg/dL
Hgb urine dipstick: NEGATIVE
Ketones, ur: 40 mg/dL — AB
Leukocytes, UA: NEGATIVE
Nitrite: NEGATIVE
Protein, ur: 30 mg/dL — AB
Specific Gravity, Urine: 1.038 — ABNORMAL HIGH (ref 1.005–1.030)
Urobilinogen, UA: 4 mg/dL — ABNORMAL HIGH (ref 0.0–1.0)
pH: 6 (ref 5.0–8.0)

## 2014-07-20 LAB — BASIC METABOLIC PANEL
Anion gap: 9 (ref 5–15)
BUN: 11 mg/dL (ref 6–23)
CHLORIDE: 104 mmol/L (ref 96–112)
CO2: 24 mmol/L (ref 19–32)
Calcium: 9.2 mg/dL (ref 8.4–10.5)
Creatinine, Ser: 1.25 mg/dL (ref 0.50–1.35)
GFR, EST NON AFRICAN AMERICAN: 79 mL/min — AB (ref >90)
Glucose, Bld: 99 mg/dL (ref 70–99)
Potassium: 3.4 mmol/L — ABNORMAL LOW (ref 3.5–5.1)
SODIUM: 137 mmol/L (ref 135–145)

## 2014-07-20 LAB — I-STAT TROPONIN, ED: Troponin i, poc: 0 ng/mL (ref 0.00–0.08)

## 2014-07-20 LAB — URINE MICROSCOPIC-ADD ON

## 2014-07-20 NOTE — ED Provider Notes (Signed)
CSN: [id number]     Arrival date & time 07/20/14  0741 History   First MD Initiated Contact with Patient 07/20/14 [phone]     Chief Complaint  Patient presents with  . Chest Pain  . Arm Pain     (Consider location/radiation/quality/duration/timing/severity/associated sxs/prior Treatment) HPI   [name] is a 24 year old male with PMH of HIV, BPD and anxiety who presents today by ambulance complaining of chest pain.  At the moment patient is falling asleep and is difficult to obtain a thorough history. He states it began about an hour ago and describes it as a constant "squeezing".  It radiates to the R arm.  Nothing makes it better, and smoking cigarettes makes it worse.  Associated symptoms include HA, SOB, and fatigue.  Denies N/V, abdominal pain, palpitations, or lower extremity edema.  He smokes about 9 cigarettes a day, does not drink alcohol or use other drugs.  Denies exercise.  He sees Dr. Luciana Axeomer for his HIV management.  Past Medical History  Diagnosis Date  . Anxiety   . Mental health disorder   . Immune deficiency disorder   . HIV (human immunodeficiency virus infection)   . Bipolar affective    History reviewed. No pertinent past surgical history. No family history on file. History  Substance Use Topics  . Smoking status: Current Every Day Smoker -- 0.30 packs/day    Types: Cigarettes  . Smokeless tobacco: Never Used     Comment: pt. trying to quit  . Alcohol Use: 0.5 oz/week    1 Not specified per week     Comment: occasional    Review of Systems level V caveat applies due to uncooperativeness and poor historian  Allergies  Tenofovir  Home Medications   Prior to Admission medications   Medication Sig Start Date End Date Taking? Authorizing Provider  abacavir-lamiVUDine (EPZICOM) 600-300 MG per tablet Take 1 tablet by mouth daily. 12/26/13  Yes Judyann Munsonynthia Snider, MD  ARIPiprazole (ABILIFY) 30 MG tablet Take 30 mg by mouth daily.   Yes Historical Provider, MD   FLUoxetine (PROZAC) 20 MG capsule Take 20 mg by mouth daily.   Yes Historical Provider, MD  NORVIR 100 MG TABS tablet TAKE 1 TABLET BY MOUTH DAILY   Yes Gardiner Barefootobert W Comer, MD  PREZISTA 800 MG tablet TAKE 1 TABLET BY MOUTH DAILY 04/03/14  Yes Gardiner Barefootobert W Comer, MD  HYDROcodone-acetaminophen (HYCET) 7.5-325 mg/15 ml solution Take 10 mLs by mouth 4 (four) times daily as needed for moderate pain or severe pain. Patient not taking: Reported on 07/20/2014 06/06/14   Trixie DredgeEmily West, PA-C  ritonavir (NORVIR) 100 MG TABS tablet Take 1 tablet (100 mg total) by mouth daily. Patient not taking: Reported on 07/20/2014 01/09/14   Gardiner Barefootobert W Comer, MD   BP 107/74 mmHg  Pulse 72  Temp(Src) 98 F (36.7 C) (Oral)  Resp 21  SpO2 99% Physical Exam  Constitutional: He is oriented to person, place, and time. He appears well-developed and well-nourished. No distress.  HENT:  Head: Normocephalic and atraumatic.  Mouth/Throat: Oropharynx is clear and moist.  Eyes: Pupils are equal, round, and reactive to light.  Neck: Normal range of motion. Neck supple.  Cardiovascular: Normal rate, regular rhythm and normal heart sounds.  Exam reveals no gallop and no friction rub.   No murmur heard. Pulmonary/Chest: Effort normal and breath sounds normal. No respiratory distress.  Neurological: He is alert and oriented to person, place, and time. He exhibits normal muscle tone. Coordination  normal.  Skin: Skin is warm and dry. No erythema.  Nursing note and vitals reviewed.   ED Course  Procedures (including critical care time) Labs Review Labs Reviewed  CBC - Abnormal; Notable for the following:    WBC 16.1 (*)    RDW 15.9 (*)    All other components within normal limits  BASIC METABOLIC PANEL - Abnormal; Notable for the following:    Potassium 3.4 (*)    GFR calc non Af Amer 79 (*)    All other components within normal limits  I-STAT TROPOININ, ED    Imaging Review Dg Chest 2 View  07/20/2014   CLINICAL DATA:  Shortness of  breath, diabetes, high blood pressure  EXAM: CHEST  2 VIEW  COMPARISON:  None.  FINDINGS: Cardiomediastinal silhouette is unremarkable. No acute infiltrate or pleural effusion. No pulmonary edema. Bony thorax is unremarkable.  IMPRESSION: No active cardiopulmonary disease.   Electronically Signed   By: Natasha Mead M.D.   On: 07/20/2014 08:18     EKG Interpretation   Date/Time:  Thursday July 20 2014 07:54:12 EST Ventricular Rate:  74 PR Interval:  156 QRS Duration: 81 QT Interval:  383 QTC Calculation: 425 R Axis:   63 Text Interpretation:  Sinus rhythm ST elevation suggests acute  pericarditis No significant change since last tracing Confirmed by Gwendolyn Grant   MD, BLAIR ([id number]) on 07/20/2014 8:04:58 AM     The patient left without informing the staff.  Patient is stable throughout his visit here in the emergency department MDM   Final diagnoses:  None       Carlyle Dolly, PA-C 07/20/14 1021  Elwin Mocha, MD 07/22/14 [phone]
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
## 2014-07-20 NOTE — ED Notes (Signed)
Lab at the bedside 

## 2014-07-20 NOTE — ED Notes (Signed)
Given a urinal  

## 2014-07-20 NOTE — ED Notes (Signed)
Per Press photographerCharge nurse, pt walked into hallway and asked where the lobby was and left.

## 2014-07-31 ENCOUNTER — Inpatient Hospital Stay (HOSPITAL_COMMUNITY)
Admission: EM | Admit: 2014-07-31 | Discharge: 2014-08-03 | DRG: 922 | Disposition: A | Discharge status: Home / Self Care | Payer: Medicaid Other | Attending: Internal Medicine | Admitting: Internal Medicine

## 2014-07-31 ENCOUNTER — Encounter (HOSPITAL_COMMUNITY): Payer: Self-pay

## 2014-07-31 DIAGNOSIS — F17210 Nicotine dependence, cigarettes, uncomplicated: Secondary | ICD-10-CM | POA: Diagnosis present

## 2014-07-31 DIAGNOSIS — X31XXXA Exposure to excessive natural cold, initial encounter: Secondary | ICD-10-CM | POA: Diagnosis present

## 2014-07-31 DIAGNOSIS — T33521A Superficial frostbite of right hand, initial encounter: Secondary | ICD-10-CM | POA: Diagnosis present

## 2014-07-31 DIAGNOSIS — N179 Acute kidney failure, unspecified: Secondary | ICD-10-CM | POA: Diagnosis present

## 2014-07-31 DIAGNOSIS — N2889 Other specified disorders of kidney and ureter: Secondary | ICD-10-CM | POA: Diagnosis present

## 2014-07-31 DIAGNOSIS — F419 Anxiety disorder, unspecified: Secondary | ICD-10-CM | POA: Diagnosis present

## 2014-07-31 DIAGNOSIS — T699XXA Effect of reduced temperature, unspecified, initial encounter: Secondary | ICD-10-CM

## 2014-07-31 DIAGNOSIS — E860 Dehydration: Secondary | ICD-10-CM | POA: Diagnosis present

## 2014-07-31 DIAGNOSIS — F1410 Cocaine abuse, uncomplicated: Secondary | ICD-10-CM

## 2014-07-31 DIAGNOSIS — F319 Bipolar disorder, unspecified: Secondary | ICD-10-CM | POA: Diagnosis present

## 2014-07-31 DIAGNOSIS — F1290 Cannabis use, unspecified, uncomplicated: Secondary | ICD-10-CM | POA: Diagnosis present

## 2014-07-31 DIAGNOSIS — T33522A Superficial frostbite of left hand, initial encounter: Principal | ICD-10-CM | POA: Diagnosis present

## 2014-07-31 DIAGNOSIS — B20 Human immunodeficiency virus [HIV] disease: Secondary | ICD-10-CM | POA: Diagnosis present

## 2014-07-31 DIAGNOSIS — Z9114 Patient's other noncompliance with medication regimen: Secondary | ICD-10-CM | POA: Diagnosis present

## 2014-07-31 DIAGNOSIS — Z888 Allergy status to other drugs, medicaments and biological substances status: Secondary | ICD-10-CM

## 2014-07-31 DIAGNOSIS — Z9119 Patient's noncompliance with other medical treatment and regimen: Secondary | ICD-10-CM | POA: Diagnosis present

## 2014-07-31 DIAGNOSIS — N182 Chronic kidney disease, stage 2 (mild): Secondary | ICD-10-CM | POA: Diagnosis present

## 2014-07-31 LAB — URINALYSIS, ROUTINE W REFLEX MICROSCOPIC
Glucose, UA: NEGATIVE mg/dL
Hgb urine dipstick: NEGATIVE
KETONES UR: NEGATIVE mg/dL
LEUKOCYTES UA: NEGATIVE
Nitrite: NEGATIVE
PROTEIN: NEGATIVE mg/dL
Specific Gravity, Urine: 1.034 — ABNORMAL HIGH (ref 1.005–1.030)
UROBILINOGEN UA: 1 mg/dL (ref 0.0–1.0)
pH: 5.5 (ref 5.0–8.0)

## 2014-07-31 LAB — COMPREHENSIVE METABOLIC PANEL
ALT: 34 U/L (ref 0–53)
ANION GAP: 9 (ref 5–15)
AST: 49 U/L — ABNORMAL HIGH (ref 0–37)
Albumin: 3.9 g/dL (ref 3.5–5.2)
Alkaline Phosphatase: 70 U/L (ref 39–117)
BUN: 17 mg/dL (ref 6–23)
CHLORIDE: 103 mmol/L (ref 96–112)
CO2: 25 mmol/L (ref 19–32)
Calcium: 9 mg/dL (ref 8.4–10.5)
Creatinine, Ser: 1.4 mg/dL — ABNORMAL HIGH (ref 0.50–1.35)
GFR calc Af Amer: 80 mL/min — ABNORMAL LOW (ref >90)
GFR calc non Af Amer: 69 mL/min — ABNORMAL LOW (ref >90)
Glucose, Bld: 162 mg/dL — ABNORMAL HIGH (ref 70–99)
Potassium: 3.3 mmol/L — ABNORMAL LOW (ref 3.5–5.1)
Sodium: 137 mmol/L (ref 135–145)
TOTAL PROTEIN: 7.3 g/dL (ref 6.0–8.3)
Total Bilirubin: 0.8 mg/dL (ref 0.3–1.2)

## 2014-07-31 LAB — CBC WITH DIFFERENTIAL/PLATELET
BASOS ABS: 0 10*3/uL (ref 0.0–0.1)
BASOS PCT: 0 % (ref 0–1)
Eosinophils Absolute: 0.2 10*3/uL (ref 0.0–0.7)
Eosinophils Relative: 2 % (ref 0–5)
HCT: 43.5 % (ref 39.0–52.0)
Hemoglobin: 14.9 g/dL (ref 13.0–17.0)
LYMPHS PCT: 25 % (ref 12–46)
Lymphs Abs: 2.3 10*3/uL (ref 0.7–4.0)
MCH: 27.4 pg (ref 26.0–34.0)
MCHC: 34.3 g/dL (ref 30.0–36.0)
MCV: 80 fL (ref 78.0–100.0)
Monocytes Absolute: 0.5 10*3/uL (ref 0.1–1.0)
Monocytes Relative: 6 % (ref 3–12)
NEUTROS PCT: 67 % (ref 43–77)
Neutro Abs: 6.2 10*3/uL (ref 1.7–7.7)
Platelets: 202 10*3/uL (ref 150–400)
RBC: 5.44 MIL/uL (ref 4.22–5.81)
RDW: 15.7 % — ABNORMAL HIGH (ref 11.5–15.5)
WBC: 9.2 10*3/uL (ref 4.0–10.5)

## 2014-07-31 LAB — I-STAT CG4 LACTIC ACID, ED: Lactic Acid, Venous: 1.48 mmol/L (ref 0.5–2.0)

## 2014-07-31 LAB — CK: Total CK: 1093 U/L — ABNORMAL HIGH (ref 7–232)

## 2014-07-31 MED ORDER — FENTANYL CITRATE 0.05 MG/ML IJ SOLN
50.0000 ug | Freq: Once | INTRAMUSCULAR | Status: AC
  Administered 2014-07-31: 50 ug via INTRAVENOUS
  Filled 2014-07-31: qty 2

## 2014-07-31 MED ORDER — SODIUM CHLORIDE 0.9 % IV BOLUS (SEPSIS)
1000.0000 mL | Freq: Once | INTRAVENOUS | Status: AC
  Administered 2014-07-31: 1000 mL via INTRAVENOUS

## 2014-07-31 MED ORDER — SODIUM CHLORIDE 0.9 % IV SOLN
Freq: Once | INTRAVENOUS | Status: AC
  Administered 2014-07-31: 23:00:00 via INTRAVENOUS

## 2014-07-31 NOTE — ED Notes (Signed)
Feet are very warm to touch, socks applied.  Warm compresses applied to bilateral hands
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
## 2014-07-31 NOTE — ED Notes (Signed)
Pulse ox of 100% obtained using pt's ears due to cold nature of fingers.
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
## 2014-08-01 ENCOUNTER — Encounter (HOSPITAL_COMMUNITY): Payer: Self-pay | Admitting: *Deleted

## 2014-08-01 DIAGNOSIS — B20 Human immunodeficiency virus [HIV] disease: Secondary | ICD-10-CM | POA: Diagnosis present

## 2014-08-01 DIAGNOSIS — N189 Chronic kidney disease, unspecified: Secondary | ICD-10-CM

## 2014-08-01 DIAGNOSIS — Z888 Allergy status to other drugs, medicaments and biological substances status: Secondary | ICD-10-CM | POA: Diagnosis not present

## 2014-08-01 DIAGNOSIS — Z9119 Patient's noncompliance with other medical treatment and regimen: Secondary | ICD-10-CM | POA: Diagnosis present

## 2014-08-01 DIAGNOSIS — F319 Bipolar disorder, unspecified: Secondary | ICD-10-CM | POA: Diagnosis present

## 2014-08-01 DIAGNOSIS — F1290 Cannabis use, unspecified, uncomplicated: Secondary | ICD-10-CM | POA: Diagnosis present

## 2014-08-01 DIAGNOSIS — F1410 Cocaine abuse, uncomplicated: Secondary | ICD-10-CM

## 2014-08-01 DIAGNOSIS — X31XXXA Exposure to excessive natural cold, initial encounter: Secondary | ICD-10-CM | POA: Diagnosis present

## 2014-08-01 DIAGNOSIS — T33521A Superficial frostbite of right hand, initial encounter: Secondary | ICD-10-CM | POA: Diagnosis present

## 2014-08-01 DIAGNOSIS — F17210 Nicotine dependence, cigarettes, uncomplicated: Secondary | ICD-10-CM | POA: Diagnosis present

## 2014-08-01 DIAGNOSIS — N179 Acute kidney failure, unspecified: Secondary | ICD-10-CM | POA: Diagnosis present

## 2014-08-01 DIAGNOSIS — F419 Anxiety disorder, unspecified: Secondary | ICD-10-CM | POA: Diagnosis present

## 2014-08-01 DIAGNOSIS — T33522A Superficial frostbite of left hand, initial encounter: Secondary | ICD-10-CM | POA: Diagnosis not present

## 2014-08-01 DIAGNOSIS — Z9114 Patient's other noncompliance with medication regimen: Secondary | ICD-10-CM | POA: Diagnosis present

## 2014-08-01 DIAGNOSIS — Z590 Homelessness: Secondary | ICD-10-CM

## 2014-08-01 DIAGNOSIS — N182 Chronic kidney disease, stage 2 (mild): Secondary | ICD-10-CM | POA: Diagnosis present

## 2014-08-01 DIAGNOSIS — E860 Dehydration: Secondary | ICD-10-CM | POA: Diagnosis present

## 2014-08-01 LAB — I-STAT CG4 LACTIC ACID, ED: Lactic Acid, Venous: 3.21 mmol/L (ref 0.5–2.0)

## 2014-08-01 LAB — BASIC METABOLIC PANEL
ANION GAP: 11 (ref 5–15)
BUN: 10 mg/dL (ref 6–23)
CALCIUM: 8.6 mg/dL (ref 8.4–10.5)
CO2: 20 mmol/L (ref 19–32)
CREATININE: 1.18 mg/dL (ref 0.50–1.35)
Chloride: 107 mmol/L (ref 96–112)
GFR calc Af Amer: >90 mL/min (ref >90)
GFR, EST NON AFRICAN AMERICAN: 85 mL/min — AB (ref >90)
Glucose, Bld: 95 mg/dL (ref 70–99)
Potassium: 3.6 mmol/L (ref 3.5–5.1)
SODIUM: 138 mmol/L (ref 135–145)

## 2014-08-01 LAB — RAPID URINE DRUG SCREEN, HOSP PERFORMED
Amphetamines: POSITIVE — AB
BARBITURATES: NOT DETECTED
Benzodiazepines: NOT DETECTED
COCAINE: POSITIVE — AB
Opiates: NOT DETECTED
Tetrahydrocannabinol: POSITIVE — AB

## 2014-08-01 LAB — CK: Total CK: 886 U/L — ABNORMAL HIGH (ref 7–232)

## 2014-08-01 LAB — LACTIC ACID, PLASMA: Lactic Acid, Venous: 0.7 mmol/L (ref 0.5–2.0)

## 2014-08-01 MED ORDER — HEPARIN SODIUM (PORCINE) 5000 UNIT/ML IJ SOLN
5000.0000 [IU] | Freq: Three times a day (TID) | INTRAMUSCULAR | Status: DC
  Administered 2014-08-01 – 2014-08-03 (×8): 5000 [IU] via SUBCUTANEOUS
  Filled 2014-08-01 (×11): qty 1

## 2014-08-01 MED ORDER — SODIUM CHLORIDE 0.9 % IV SOLN
INTRAVENOUS | Status: DC
  Administered 2014-08-01 – 2014-08-03 (×4): via INTRAVENOUS

## 2014-08-01 NOTE — Progress Notes (Signed)
I have seen and examined [name] at bedside and reviewed his chart. He was admitted earlier this morning by Dr. Julian Chambers with frostbite in setting of active cocaine use. Please refer to Dr. Boston Chambers's comprehensive assessment and care plan. Patient denies any complaints. We'll repeat CPK/CBC/chemistry in a.m, meanwhile continue supportive measures being implemented. I agree with deferring management of HIV to patient's primary infectious disease physician- Dr Blake Chambers as compliance may be an issue.

## 2014-08-01 NOTE — Clinical Documentation Improvement (Signed)
Supporting Information:  Labs: 02/16:  Lactic acid: 3.21.    Possible Clinical Condition? . Bacteremia (positive blood cultures only) . Sepsis with UTI, versus UTI only . Sepsis-specify causative organism if known . Sepsis due to: --Device --Implant --Graft --Infusion . Severe sepsis-sepsis with organ dysfunction --Specify organ dysfunction Respiratory failure Encephalopathy Acute kidney failure Other (specify) . SIRS (Systemic Inflammatory Response Syndrome --With or without organ dysfunction . Document septic shock if present . Document any associated diagnoses/conditions     Thank Gabriel CirriYou,  Jaymian Bogart Mathews-Bethea,RN,BSN,Clinical Documentation Specialist [phone] [email or web address]
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
## 2014-08-01 NOTE — Progress Notes (Signed)
Nutrition Brief Note  Patient identified on the Malnutrition Screening Tool (MST) Report  Wt Readings from Last 15 Encounters:  08/01/14 147 lb 11.3 oz (67 kg)  11/10/13 157 lb (71.215 kg)  07/18/13 170 lb (77.111 kg)  04/07/13 158 lb (71.668 kg)  11/30/12 144 lb (65.318 kg)  09/16/12 157 lb (71.215 kg)  06/02/12 156 lb (70.761 kg)  02/13/11 159 lb (72.122 kg)   Blake Chambers is a 24 y.o. male with history of HIV, BPD, anxiety, not compliant with any meds for past couple of months. Patient states [place] [address] for the past 4 days / 3 nights while "on a bender" smoking crack cocaine (smokes it, denies IV use). Found by his boyfriend this morning, brought home, warmed at home, brought in due to bilateral hand and feet pain and discoloration.  Pt reports good appetite presently. He reports minimal intake 3-4 days PTA due to not feeling well. However, appetite is generally good. He reveals UBW is around 143-153#. He reports he was weighing 131# a few months ago, but has been trying to gain weight. He denies any further nutritional needs at this time. Pt was very grateful for RD visit.   Body mass index is 21.8 kg/(m^2). Patient meets criteria for normal weight range based on current BMI.   Current diet order is regular, patient is consuming approximately 100% of meals at this time. Labs and medications reviewed.   No nutrition interventions warranted at this time. If nutrition issues arise, please consult RD.   Blake Chambers, RD, LDN, CDE Pager: [phone] After hours Pager: [phone]

## 2014-08-01 NOTE — Consult Note (Signed)
[facility]    Date of Admission:  07/31/2014          Reason for Consult: HIV infection currently out of care    Referring Physician: Dr. Christean LeafSimboso Ranga   Principal Problem:   Frostbite of both hands Active Problems:   HIV disease   Chronic renal insufficiency, stage II (mild)   Cocaine abuse   . heparin  5,000 Units Subcutaneous 3 times per day    Recommendations: 1. Observe off of antiretroviral therapy for now 2. Check CD4 count, HIV viral load and genotype resistance assay  3. Check RPR, urine and GC screens 4. Begin the process to recertify for the state ADAP program  Assessment: I agree that he probably has early frostbite. The distribution of his blisters does not suggest levamisole vasculitis associated with adulterated cocaine. With a CD4 count over 1000 last August he is unlikely to be at risk for any complications of his HIV infection at this time. I will keep him off of antiretroviral therapy pending repeat CD4 count, viral load and genotype resistance assay and arrange follow-up in our clinic.   HPI: Blake Chambers is a 24 y.o. male with HIV infection who has been followed in the past by my partner, Dr. Merceda Elksob Comer. He was last seen in clinic in May of last year. At that time he was taking Epzicom, Prezista and Norvir and it appeared that his adherence was fairly good. He last had HIV blood work in August. His CD4 count at that time was well up in the normal range at 1070 but his viral load was inching up to 737. I spoke with his pharmacy and he was picking up his medication on a fairly regular basis until November. He has not had any refills since that time although he tells me that he ran out of his medication last week. He does admit that he has problems remembering to take his medications because there are so much else going on in his life. He has been living with family and friends and appears to be homeless. For the last 4 days  he has been living on the street and using cocaine. He states that he uses "as much as he can get". He was found by his boyfriend and brought to the hospital because his hands were numb and discolored. He is being treated for early frostbite.   Review of Systems: Constitutional: negative Eyes: negative Ears, nose, mouth, throat, and face: negative Respiratory: negative Cardiovascular: negative Gastrointestinal: negative Genitourinary:negative  Past Medical History  Diagnosis Date  . Anxiety   . Mental health disorder   . Immune deficiency disorder   . HIV (human immunodeficiency virus infection)   . Bipolar affective     History  Substance Use Topics  . Smoking status: Current Every Day Smoker -- 0.30 packs/day    Types: Cigarettes  . Smokeless tobacco: Never Used     Comment: pt. trying to quit  . Alcohol Use: 0.5 oz/week    1 Standard drinks or equivalent per week     Comment: occasional    History reviewed. No pertinent family history. Allergies  Allergen Reactions  . Tenofovir Other (See Comments)    Intolerance, increase in creatinine    OBJECTIVE: Blood pressure 132/72, pulse 88, temperature 99.5 F (37.5 C), temperature source Oral, resp. rate 20, height 5\' 9"  (1.753 m), weight 147 lb 11.3 oz (67 kg), SpO2 100 %.  General: He is drowsy but otherwise he is in good spirits and in no distress Skin: He has some superficial blisters on fingers of both hands. His hands are warm and appear to be well perfused. There is no current discoloration Lungs: Clear Cor: Regular S1 and S2 with no murmur Abdomen: Nontender  Lab Results Lab Results  Component Value Date   WBC 9.2 07/31/2014   HGB 14.9 07/31/2014   HCT 43.5 07/31/2014   MCV 80.0 07/31/2014   PLT 202 07/31/2014    Lab Results  Component Value Date   CREATININE 1.18 08/01/2014   BUN 10 08/01/2014   NA 138 08/01/2014   K 3.6 08/01/2014   CL 107 08/01/2014   CO2 20 08/01/2014    Lab Results  Component  Value Date   ALT 34 07/31/2014   AST 49* 07/31/2014   ALKPHOS 70 07/31/2014   BILITOT 0.8 07/31/2014    HIV 1 RNA QUANT (copies/mL)  Date Value  01/26/2014 737*  11/16/2013 93*  10/10/2013 678*   CD4 T CELL ABS (/uL)  Date Value  01/26/2014 1070  11/16/2013 830  10/10/2013 820   Microbiology: No results found for this or any previous visit (from the past 240 hour(s)).  Cliffton Asters, MD [facility] [phone] pager   [phone] cell 08/01/2014, 3:30 PM

## 2014-08-01 NOTE — Progress Notes (Signed)
UR Completed.  [phone]  

## 2014-08-01 NOTE — Progress Notes (Signed)
Blake Chambers [id number] Code Status: Full   Admission Data: 08/01/2014 6:48 AM Attending Provider: Venetia Constableanga PCP:No PCP Per Patient Consults/ Treatment Team:    Blake Dienererrance Bolar is a 24 y.o. male patient admitted from ED awake, alert - oriented  X 3 - no acute distress noted.  VSS - Blood pressure 114/58, pulse 88, temperature 98.2 F (36.8 C), temperature source Oral, resp. rate 16, height 5\' 9"  (1.753 m), weight 67 kg (147 lb 11.3 oz), SpO2 100 %.    IV in place, occlusive dsg intact without redness.  Orientation to room, and floor completed with information packet given to patient/family.  Patient declined safety video at this time.  Admission INP armband ID verified with patient/family, and in place.   SR up x 2, fall assessment complete, with patient and family able to verbalize understanding of risk associated with falls, and verbalized understanding to call nsg before up out of bed.  Call light within reach, patient able to voice, and demonstrate understanding.  Skin, clean-dry- intact without evidence of bruising, or skin tears.   No evidence of skin break down noted on exam.     Will cont to eval and treat per MD orders.  Sharilyn SitesJeter, Lisaanne Lawrie M, RN 08/01/2014 6:48 AM

## 2014-08-02 LAB — COMPREHENSIVE METABOLIC PANEL
ALT: 27 U/L (ref 0–53)
AST: 31 U/L (ref 0–37)
Albumin: 3.6 g/dL (ref 3.5–5.2)
Alkaline Phosphatase: 63 U/L (ref 39–117)
Anion gap: 6 (ref 5–15)
BILIRUBIN TOTAL: 0.6 mg/dL (ref 0.3–1.2)
BUN: 9 mg/dL (ref 6–23)
CHLORIDE: 107 mmol/L (ref 96–112)
CO2: 25 mmol/L (ref 19–32)
CREATININE: 1.1 mg/dL (ref 0.50–1.35)
Calcium: 8.9 mg/dL (ref 8.4–10.5)
GFR calc Af Amer: >90 mL/min (ref >90)
Glucose, Bld: 99 mg/dL (ref 70–99)
Potassium: 3.7 mmol/L (ref 3.5–5.1)
SODIUM: 138 mmol/L (ref 135–145)
Total Protein: 6.8 g/dL (ref 6.0–8.3)

## 2014-08-02 LAB — T-HELPER CELLS (CD4) COUNT (NOT AT ARMC)
CD4 % Helper T Cell: 38 % (ref 33–55)
CD4 T Cell Abs: 1010 /uL (ref 400–2700)

## 2014-08-02 LAB — MAGNESIUM: Magnesium: 2 mg/dL (ref 1.5–2.5)

## 2014-08-02 LAB — CBC
HCT: 42.3 % (ref 39.0–52.0)
HEMOGLOBIN: 14.1 g/dL (ref 13.0–17.0)
MCH: 26.7 pg (ref 26.0–34.0)
MCHC: 33.3 g/dL (ref 30.0–36.0)
MCV: 80.1 fL (ref 78.0–100.0)
PLATELETS: 175 10*3/uL (ref 150–400)
RBC: 5.28 MIL/uL (ref 4.22–5.81)
RDW: 15.8 % — ABNORMAL HIGH (ref 11.5–15.5)
WBC: 7 10*3/uL (ref 4.0–10.5)

## 2014-08-02 LAB — HIV-1 RNA QUANT-NO REFLEX-BLD: HIV 1 RNA Quant: <20 copies/mL (ref <20)

## 2014-08-02 LAB — PROTIME-INR
INR: 1.07 (ref 0.00–1.49)
Prothrombin Time: 14 seconds (ref 11.6–15.2)

## 2014-08-02 LAB — CK: CK TOTAL: 472 U/L — AB (ref 7–232)

## 2014-08-02 NOTE — Progress Notes (Addendum)
PROGRESS NOTE  Blake Chambers DOB: [birth year] DOA: 07/31/2014 PCP: No PCP Per Patient  HPI/Recap of past 24 hours: Feeling better, reported increase sensation to fingers and toes,   Assessment/Plan: Principal Problem:   Frostbite of both hands Active Problems:   HIV disease   Chronic renal insufficiency, stage II (mild)   Cocaine abuse  1. Frostbite of fingers on both hands - see also pictures in patient chart 1. Concern for risk of loss of didgets given the severity of frostbite. 2. Spoke with Dr. Melvyn Novasrtmann over the phone, he did not think that TPA would be indicated at this time especially given the long duration since initial injury (up to 72+ hours now), there is also very limited evidence for the use of TPA in frostbite. 3. Didgets rewarmed, will continue IVF at this point, CK trending down, lactate normalized.  2. HIV disease - noncompliant with meds and follow ups. rechecking CD4 and viral load,  was not undetectable back in august before he stopped taking HAART therapy, ID consulted, recommend hold all home meds, outpatient follow up with ID. 3. CKD stage 2 - creatinine elevated from baseline today, AKI due to dehydration vs HIVAN - checking viral load and hydrating, cr normalized 4. Cocaine abuse: education provided  Code Status: full  Family Communication: patient  Disposition Plan: home, likely 2/18   Consultants:  ID  Procedures:  none  Antibiotics:  none   Objective: BP 117/64 mmHg  Pulse 71  Temp(Src) 98.4 F (36.9 C) (Oral)  Resp 18  Ht 5\' 9"  (1.753 m)  Wt 67 kg (147 lb 11.3 oz)  BMI 21.80 kg/m2  SpO2 100%  Intake/Output Summary (Last 24 hours) at 08/02/14 1453 Last data filed at 08/02/14 0700  Gross per 24 hour  Intake   2980 ml  Output   1000 ml  Net   1980 ml   Filed Weights   08/01/14 0104  Weight: 67 kg (147 lb 11.3 oz)    Exam:  General Appearance:   Alert, oriented, no distress, appears stated age  Head:    Normocephalic, atraumatic  Eyes:   PERRL, EOMI, sclera non-icteric      Nose:  Nares without drainage or epistaxis. Mucosa, turbinates normal  Throat:  Moist mucous membranes. Oropharynx without erythema or exudate.  Neck:  Supple. No carotid bruits. No thyromegaly. No lymphadenopathy.   Back:   No CVA tenderness, no spinal tenderness  Lungs:   Clear to auscultation bilaterally, without wheezes, rhonchi or rales  Chest wall:   No tenderness to palpitation  Heart:   Regular rate and rhythm without murmurs, gallops, rubs  Abdomen:   Soft, non-tender, nondistended, normal bowel sounds, no organomegaly  Genitalia:   deferred  Rectal:   deferred  Extremities:  Dusky appearance  And cold to touch especially right index finger, the rest of fingers and toes warm to touch, patient reported much improvement compare to yesterday  Pulses:  2+ and symmetric all extremities  Skin:  Skin color, texture, turgor normal, no rashes or lesions  Lymph nodes:  Cervical, supraclavicular, and axillary nodes normal  Neurologic:  CNII-XII intact. Normal strength, sensation and reflexes   throughout         Data Reviewed: Basic Metabolic Panel:  Recent Labs Lab 07/31/14 2156 08/01/14 0531 08/02/14 0720  NA 137 138 138  K 3.3* 3.6 3.7  CL 103 107 107  CO2 25 20 25   GLUCOSE 162* 95 99  BUN 17 10 9  CREATININE 1.40* 1.18 1.10  CALCIUM 9.0 8.6 8.9  MG  --   --  2.0   Liver Function Tests:  Recent Labs Lab 07/31/14 2156 08/02/14 0720  AST 49* 31  ALT 34 27  ALKPHOS 70 63  BILITOT 0.8 0.6  PROT 7.3 6.8  ALBUMIN 3.9 3.6   No results for input(s): LIPASE, AMYLASE in the last 168 hours. No results for input(s): AMMONIA in the last 168 hours. CBC:  Recent Labs Lab 07/31/14 2156 08/02/14 0720  WBC 9.2 7.0  NEUTROABS 6.2  --   HGB 14.9 14.1  HCT 43.5 42.3  MCV 80.0 80.1  PLT 202 175   Cardiac Enzymes:    Recent Labs Lab  07/31/14 2156 08/01/14 0531 08/02/14 0720  CKTOTAL 1093* 886* 472*   BNP (last 3 results) No results for input(s): BNP in the last 8760 hours.  ProBNP (last 3 results) No results for input(s): PROBNP in the last 8760 hours.  CBG: No results for input(s): GLUCAP in the last 168 hours.  No results found for this or any previous visit (from the past 240 hour(s)).   Studies: No results found.  Scheduled Meds: . heparin  5,000 Units Subcutaneous 3 times per day    Continuous Infusions: . sodium chloride 100 mL/hr at 08/02/14 0835       Ayo Guarino  [facility] Pager [phone]. If 7PM-7AM, please contact night-coverage at [email or web address], password [facility] 08/02/2014, 2:53 PM  LOS: 1 day

## 2014-08-02 NOTE — Progress Notes (Signed)
Patient ID: Blake Chambers, male   DOB: Feb 14, 1991, 24 y.o.   MRN: [id number]         [facility]    Date of Admission:  07/31/2014     Principal Problem:   Frostbite of both hands Active Problems:   HIV disease   Chronic renal insufficiency, stage II (mild)   Cocaine abuse   . heparin  5,000 Units Subcutaneous 3 times per day    Subjective: Blake Chambers is feeling better. He still has some numbness in his fingers but this is better. He denies any pain. He states that after discharge he will be staying with his friend, Blake Chambers (phone #[phone]). He said it is okay to leave a message with Blake Chambers or on his phone regarding future clinic visits.  Review of Systems: Pertinent items are noted in HPI.  Past Medical History  Diagnosis Date  . Anxiety   . Mental health disorder   . Immune deficiency disorder   . HIV (human immunodeficiency virus infection)   . Bipolar affective     History  Substance Use Topics  . Smoking status: Current Every Day Smoker -- 0.30 packs/day    Types: Cigarettes  . Smokeless tobacco: Never Used     Comment: pt. trying to quit  . Alcohol Use: 0.5 oz/week    1 Standard drinks or equivalent per week     Comment: occasional    History reviewed. No pertinent family history. Allergies  Allergen Reactions  . Tenofovir Other (See Comments)    Intolerance, increase in creatinine    OBJECTIVE: Blood pressure 117/64, pulse 71, temperature 98.4 F (36.9 C), temperature source Oral, resp. rate 18, height 5\' 9"  (1.753 m), weight 147 lb 11.3 oz (67 kg), SpO2 100 %. General: He is alert and in no distress Hands: The blisters on several fingers are unchanged. There is no evidence of skin necrosis. His hands are warm and well perfused. He has good range of motion without pain.  Lab Results Lab Results  Component Value Date   WBC 7.0 08/02/2014   HGB 14.1 08/02/2014   HCT 42.3 08/02/2014   MCV 80.1 08/02/2014   PLT 175 08/02/2014    Lab Results  Component Value Date   CREATININE 1.10 08/02/2014   BUN 9 08/02/2014   NA 138 08/02/2014   K 3.7 08/02/2014   CL 107 08/02/2014   CO2 25 08/02/2014    Lab Results  Component Value Date   ALT 27 08/02/2014   AST 31 08/02/2014   ALKPHOS 63 08/02/2014   BILITOT 0.6 08/02/2014    HIV 1 RNA QUANT (copies/mL)  Date Value  01/26/2014 737*  11/16/2013 93*  10/10/2013 678*   CD4 T CELL ABS (/uL)  Date Value  08/01/2014 1010  01/26/2014 1070  11/16/2013 830   Microbiology: No results found for this or any previous visit (from the past 240 hour(s)).  Assessment: He is improving slowly with time for his frostbite. His CD4 count remains well up in the normal range. He states that he is interested in returning to our clinic to reenroll in care with Blake Chambers. I asked him to bring his financial paperwork so that he can recertify for ADAP.  Plan: 1. Discharge home soon 2. I will arrange follow-up in our clinic soon 3. I will sign off now  Blake AstersJohn Jerimiah Wolman, MD [facility] [phone] pager   [phone] cell 08/02/2014, 10:51 AM

## 2014-08-03 DIAGNOSIS — Z9119 Patient's noncompliance with other medical treatment and regimen: Secondary | ICD-10-CM

## 2014-08-03 NOTE — Discharge Summary (Signed)
Discharge Summary  Blake Chambers WJX:[id number] DOB: 1990/08/26  PCP: No PCP Per Patient  Admit date: 07/31/2014 Discharge date: 08/03/2014  Time spent: less than  Recommendations for Outpatient Follow-up:  1. To establish primary care physician 2. F/u with ID in 2wks  Discharge Diagnoses:  Active Hospital Problems   Diagnosis Date Noted  . Frostbite of both hands 08/01/2014  . Cocaine abuse 08/01/2014  . Chronic renal insufficiency, stage II (mild) 11/30/2012  . HIV disease 02/13/2011    Resolved Hospital Problems   Diagnosis Date Noted Date Resolved  No resolved problems to display.    Discharge Condition: stable, improved  Diet recommendation: regular diet  Filed Weights   08/01/14 0104  Weight: 67 kg (147 lb 11.3 oz)    History of present illness:  Blake Chambers is a 24 y.o. male with history of HIV, BPD, anxiety, not compliant with any meds for past couple of months. Patient states he has been outside on [address] for the past 4 days / 3 nights prior to being admitted to the hospital while "on a bender" smoking crack cocaine (smokes it, denies IV use). Found by his boyfriend this morning, brought home, warmed at home, brought in due to bilateral hand and feet pain and discoloration.  In ED feet discoloration has improved, but hand discoloration persists.  Hospital Course:  Principal Problem:   Frostbite of both hands Active Problems:   HIV disease   Chronic renal insufficiency, stage II (mild)   Cocaine abuse  1. Frostbite of fingers on both hands - see also pictures in patient chart 1. Initially Concern for risk of loss of didgets given the severity of frostbite. 2. Spoke with Dr. Melvyn Novas over the phone, he did not think that TPA would be indicated at this time especially given the long duration since initial injury (up to 72+ hours now), there is also very limited evidence for the use of TPA in frostbite. 3. Didgets rewarmed, received continue  IVF, CK trending down, lactate normalized. 4. Digit rewarmed, discoloration completely resolved at the time of discharge, does has some nonpainful blisters on right index and middle finger and left index finger, capillary refill improved, sensation improved.  2. HIV disease - noncompliant with meds and follow ups. rechecking CD4 and viral load, was not undetectable back in august before he stopped taking HAART therapy, ID consulted, recommend hold all home meds, outpatient follow up with ID. 3. CKD stage 2 - creatinine elevated from baseline today, AKI due to dehydration vs HIVAN - checking viral load and hydrating, cr normalized 4. Cocaine abuse: education provided 5. Medication and medical follow up noncompliance, education provided.  Code Status: full  Family Communication: patient  Disposition Plan: home on 2/18   Consultants:  ID  Procedures:  none  Antibiotics:  none   Discharge Exam: BP 109/73 mmHg  Pulse 77  Temp(Src) 98.8 F (37.1 C) (Oral)  Resp 18  Ht  (1.753 m)  Wt 67 kg (147 lb 11.3 oz)  BMI 21.80 kg/m2  SpO2 97%  General Appearance:   Alert, oriented, no distress, appears stated age  Head:   Normocephalic, atraumatic  Eyes:   PERRL, EOMI, sclera non-icteric      Nose:  Nares without drainage or epistaxis. Mucosa, turbinates normal  Throat:  Moist mucous membranes. Oropharynx without erythema or exudate.  Neck:  Supple. No carotid bruits. No thyromegaly. No lymphadenopathy.   Back:   No CVA tenderness, no spinal tenderness  Lungs:  Clear to auscultation bilaterally, without wheezes, rhonchi or rales  Chest wall:   No tenderness to palpitation  Heart:   Regular rate and rhythm without murmurs, gallops, rubs  Abdomen:   Soft, non-tender, nondistended, normal bowel sounds, no organomegaly  Genitalia:   deferred  Rectal:   deferred  Extremities:  Dusky  appearanceresolved, finger warm to touch, improved capillary refill, improved sensation. Nontender blistering on right index and middle finger and left index finger at the time of discharge  Pulses:  2+ and symmetric all extremities  Skin:  Skin color, texture, turgor normal, no rashes or lesions  Lymph nodes:  Cervical, supraclavicular, and axillary nodes normal  Neurologic:  CNII-XII intact. Normal strength, sensation and reflexes   throughout              Discharge Instructions You were cared for by a hospitalist during your hospital stay. If you have any questions about your discharge medications or the care you received while you were in the hospital after you are discharged, you can call the unit and asked to speak with the hospitalist on call if the hospitalist that took care of you is not available. Once you are discharged, your primary care physician will handle any further medical issues. Please note that NO REFILLS for any discharge medications will be authorized once you are discharged, as it is imperative that you return to your primary care physician (or establish a relationship with a primary care physician if you do not have one) for your aftercare needs so that they can reassess your need for medications and monitor your lab values.  Discharge Instructions    Diet - low sodium heart healthy    Complete by:  As directed      Increase activity slowly    Complete by:  As directed             Medication List    TAKE these medications        abacavir-lamiVUDine 600-300 MG per tablet  Commonly known as:  EPZICOM  Take 1 tablet by mouth daily.     ARIPiprazole 30 MG tablet  Commonly known as:  ABILIFY  Take 30 mg by mouth daily.     FLUoxetine 20 MG capsule  Commonly known as:  PROZAC  Take 20 mg by mouth daily.     NORVIR 100 MG Tabs tablet  Generic drug:  ritonavir  TAKE 1 TABLET BY MOUTH DAILY     ritonavir 100 MG Tabs tablet    Commonly known as:  NORVIR  Take 1 tablet (100 mg total) by mouth daily.     PREZISTA 800 MG tablet  Generic drug:  Darunavir Ethanolate  TAKE 1 TABLET BY MOUTH DAILY       Allergies  Allergen Reactions  . Tenofovir Other (See Comments)    Intolerance, increase in creatinine       Follow-up Information    Follow up with Staci Righter, MD.   Specialty:  Infectious Diseases   Contact information:   [address] [place] [address] [phone]       Follow up In 2 weeks.       The results of significant diagnostics from this hospitalization (including imaging, microbiology, ancillary and laboratory) are listed below for reference.    Significant Diagnostic Studies: Dg Chest 2 View  07/20/2014   CLINICAL DATA:  Shortness of breath, diabetes, high blood pressure  EXAM: CHEST  2 VIEW  COMPARISON:  None.  FINDINGS: Cardiomediastinal silhouette is unremarkable. No acute infiltrate or pleural effusion. No pulmonary edema. Bony thorax is unremarkable.  IMPRESSION: No active cardiopulmonary disease.   Electronically Signed   By: Natasha MeadLiviu  Pop M.D.   On: 07/20/2014 08:18    Microbiology: No results found for this or any previous visit (from the past 240 hour(s)).   Labs: Basic Metabolic Panel:  Recent Labs Lab 07/31/14 2156 08/01/14 0531 08/02/14 0720  NA 137 138 138  K 3.3* 3.6 3.7  CL 103 107 107  CO2 25 20 25   GLUCOSE 162* 95 99  BUN 17 10 9   CREATININE 1.40* 1.18 1.10  CALCIUM 9.0 8.6 8.9  MG  --   --  2.0   Liver Function Tests:  Recent Labs Lab 07/31/14 2156 08/02/14 0720  AST 49* 31  ALT 34 27  ALKPHOS 70 63  BILITOT 0.8 0.6  PROT 7.3 6.8  ALBUMIN 3.9 3.6   No results for input(s): LIPASE, AMYLASE in the last 168 hours. No results for input(s): AMMONIA in the last 168 hours. CBC:  Recent Labs Lab 07/31/14 2156 08/02/14 0720  WBC 9.2 7.0  NEUTROABS 6.2  --   HGB 14.9 14.1  HCT 43.5 42.3  MCV 80.0 80.1  PLT 202 175   Cardiac  Enzymes:  Recent Labs Lab 07/31/14 2156 08/01/14 0531 08/02/14 0720  CKTOTAL 1093* 886* 472*   BNP: BNP (last 3 results) No results for input(s): BNP in the last 8760 hours.  ProBNP (last 3 results) No results for input(s): PROBNP in the last 8760 hours.  CBG: No results for input(s): GLUCAP in the last 168 hours.     Signed:  Nashley Cordoba  [facility] 08/03/2014, 11:17 AM

## 2014-08-03 NOTE — Progress Notes (Signed)
Discussed discharge summary with pt.  PIV removed.  Pt denied any needs at this time.  Pt taken to discharge location via wheelchair.

## 2014-08-08 LAB — PHENOSENSE GT (R)

## 2014-08-21 ENCOUNTER — Encounter: Payer: Self-pay | Admitting: Internal Medicine

## 2014-08-21 ENCOUNTER — Ambulatory Visit (INDEPENDENT_AMBULATORY_CARE_PROVIDER_SITE_OTHER): Payer: Medicaid Other | Admitting: Internal Medicine

## 2014-08-21 ENCOUNTER — Other Ambulatory Visit: Payer: Self-pay | Admitting: *Deleted

## 2014-08-21 VITALS — BP 118/81 | HR 101 | Temp 98.3°F | Wt 154.0 lb

## 2014-08-21 DIAGNOSIS — B20 Human immunodeficiency virus [HIV] disease: Secondary | ICD-10-CM

## 2014-08-21 DIAGNOSIS — F3110 Bipolar disorder, current episode manic without psychotic features, unspecified: Secondary | ICD-10-CM

## 2014-08-21 MED ORDER — RITONAVIR 100 MG PO TABS: 100.0000 mg | ORAL_TABLET | Freq: Every day | ORAL | Status: DC

## 2014-08-21 MED ORDER — ABACAVIR SULFATE-LAMIVUDINE 600-300 MG PO TABS: 1.0000 | ORAL_TABLET | Freq: Every day | ORAL | Status: DC

## 2014-08-21 MED ORDER — DARUNAVIR ETHANOLATE 800 MG PO TABS: 800.0000 mg | ORAL_TABLET | Freq: Every day | ORAL | Status: DC

## 2014-08-21 NOTE — Progress Notes (Signed)
   Subjective:    Patient ID: Blake Chambers, male    DOB: 1991/01/07, 24 y.o.   MRN: [id number]  HPI He is here for hospital follow-up. He is a history of noncompliance and drug use and recently has been doing the same. He was hospitalized for frostbite and did lose some digits. He is here to reestablish care and get back on medication. Somehow, his most recent viral load is undetectable when he was in the hospital. Though he has been off medication.   Review of Systems  Constitutional: Negative for fatigue.  Eyes: Negative for visual disturbance.  Gastrointestinal: Negative for diarrhea.  Skin: Negative for rash.  Neurological: Negative for dizziness, light-headedness and headaches.       Objective:   Physical Exam  Constitutional: He appears well-developed and well-nourished. No distress.  HENT:  Mouth/Throat: No oropharyngeal exudate.  Eyes: No scleral icterus.  Cardiovascular: Normal rate, regular rhythm and normal heart sounds.   No murmur heard. Pulmonary/Chest: Effort normal and breath sounds normal. No respiratory distress. He has no wheezes.  Skin: No rash noted.          Assessment & Plan:

## 2014-08-21 NOTE — Assessment & Plan Note (Signed)
We'll get him back on the same regimen with Prezista, Norvir and Truvada. He does have Medicaid so he can get the medication soon and we will follow up with him soon with repeat labs.

## 2014-08-21 NOTE — Assessment & Plan Note (Signed)
He will need to get back into mental health care. I did discuss this with him and he was seen by her case manager as well. He will get into the mental health to try to get stabilized.

## 2014-08-29 ENCOUNTER — Ambulatory Visit: Payer: Medicaid Other | Admitting: *Deleted

## 2014-08-29 DIAGNOSIS — F1010 Alcohol abuse, uncomplicated: Secondary | ICD-10-CM

## 2014-08-29 DIAGNOSIS — F1410 Cocaine abuse, uncomplicated: Secondary | ICD-10-CM

## 2014-08-29 NOTE — BH Specialist Note (Signed)
Blake Chambers was present for his scheduled appointment today.  Client has been scheduled before but never showed.  Client was oriented times four with good affect and dress.  Client was alert and talkative.  Client communicated a little about his life but tended to jump around from subject to subject.  Client was open and honest revealing very serious issues regarding his substance abuse, his health and his detachment from his family who lives in [place].  Client indicates that once he realized he was homosexual, he knew his family would not be accepting especially his mother. Client states that after he told his mother and "came out", she kicked him out of the house. He was 15 at the time. Client reports that his father has never been involved in his life and that he only met him two times.  Client shared that his father died one month after their second meeting. Client shared that he has had issues with getting along with people especially his family since he was young because he was different and his way of thinking was different.  Client reported starting to use drugs around 15 in order to cope with life's struggles.  Client states that he began smoking crack cocaine when he was 19.  Client reports that he recently went on a smoking cocaine binge. Client reports that he was so high for a few days, that he was exposed to the elements outside and his hand and fingers became frost bit. Client is facing possible amputation of several fingers at this time.  Client says that his life is a mess and he wants and desires to clean it up but just doesn't know how too or where to begin.  Counselor suggested that he attend individual sessions in order to get a handle on the drug abuse and the underlying reason for using.  Client agreed and made another appointment.  Rolena Infante, LPCA, MA [facility]

## 2014-09-12 ENCOUNTER — Ambulatory Visit: Payer: Medicaid Other | Admitting: *Deleted

## 2014-09-13 ENCOUNTER — Other Ambulatory Visit: Payer: Self-pay | Admitting: Internal Medicine

## 2014-09-13 ENCOUNTER — Other Ambulatory Visit: Payer: Medicaid Other

## 2014-09-13 DIAGNOSIS — B20 Human immunodeficiency virus [HIV] disease: Secondary | ICD-10-CM

## 2014-09-15 LAB — T-HELPER CELL (CD4) - (RCID CLINIC ONLY)
CD4 T CELL HELPER: 32 % — AB (ref 33–55)
CD4 T Cell Abs: 1000 /uL (ref 400–2700)

## 2014-09-16 LAB — HIV-1 RNA QUANT-NO REFLEX-BLD
HIV 1 RNA QUANT: 6886 {copies}/mL — AB (ref <20)
HIV-1 RNA QUANT, LOG: 3.84 {Log} — AB (ref <1.30)

## 2014-09-18 ENCOUNTER — Encounter: Payer: Self-pay | Admitting: *Deleted

## 2014-09-18 ENCOUNTER — Other Ambulatory Visit: Payer: Self-pay | Admitting: *Deleted

## 2014-09-18 DIAGNOSIS — B20 Human immunodeficiency virus [HIV] disease: Secondary | ICD-10-CM

## 2014-09-18 NOTE — Telephone Encounter (Signed)
ERROR

## 2014-09-18 NOTE — Telephone Encounter (Signed)
-----   Message from Gardiner Barefootobert W Comer, MD sent at 09/18/2014 10:05 AM EDT ----- Please add a genotype to his viral load. thanks

## 2014-09-21 ENCOUNTER — Other Ambulatory Visit: Payer: Medicaid Other

## 2014-09-26 LAB — HIV-1 GENOTYPR PLUS

## 2014-09-27 ENCOUNTER — Ambulatory Visit (INDEPENDENT_AMBULATORY_CARE_PROVIDER_SITE_OTHER): Payer: Medicaid Other | Admitting: Internal Medicine

## 2014-09-27 VITALS — BP 117/87 | HR 102 | Temp 98.8°F | Wt 159.0 lb

## 2014-09-27 DIAGNOSIS — F1410 Cocaine abuse, uncomplicated: Secondary | ICD-10-CM | POA: Diagnosis not present

## 2014-09-27 DIAGNOSIS — T33522A Superficial frostbite of left hand, initial encounter: Secondary | ICD-10-CM

## 2014-09-27 DIAGNOSIS — B20 Human immunodeficiency virus [HIV] disease: Secondary | ICD-10-CM

## 2014-09-27 DIAGNOSIS — T33521A Superficial frostbite of right hand, initial encounter: Secondary | ICD-10-CM

## 2014-09-27 NOTE — Assessment & Plan Note (Signed)
Continues to remain drug free by his account and is seeing his psychiatrist tomorrow at [facility].

## 2014-09-27 NOTE — Assessment & Plan Note (Addendum)
Discussed with Dr. Haskell RilingM Weingold's PA and to review case in am - pictures available under 'media' tab.

## 2014-09-27 NOTE — Assessment & Plan Note (Addendum)
Just restarted his regimen but feels he is in a much better place and motivated to take meds.  RTC in 4 weeks for labs and 1-2 weeks later with me.   40 minutes spent with HIV and hand issues.

## 2014-09-27 NOTE — Progress Notes (Signed)
   Subjective:    Patient ID: Blake Chambers, male    DOB: December 04, 1990, 24 y.o.   MRN: [id number]  HPI  Blake Chambers is a 24 y/o male here for follow up of HIV disease. He recently came back into care in March after admission to the hospital for gangrene r/t frostbite. He had been homeless at the time and had been using crack. At his last visit he was started on Prezista, Norvir and Truvada. He just started these on Sunday. His last VL was 6886 and CD4 1,000. He states that he has taken his medications every day since Sunday and hadn't taken his meds prior to this because he wasn't "straight in the head" due to his bipolar disease and depression. He has F[facility]. Regarding his gangrene, he has multiple fingers affected. He denies any drainage from the necrotic fingers.    Review of Systems  Constitutional: Negative for fever, activity change, appetite change, fatigue and unexpected weight change.  HENT: Negative for mouth sores, sinus pressure and sore throat.   Eyes: Negative for photophobia and visual disturbance.  Respiratory: Negative for cough, chest tightness, shortness of breath and wheezing.   Cardiovascular: Negative for chest pain and palpitations.  Gastrointestinal: Negative for abdominal pain, diarrhea, constipation and blood in stool.  Endocrine: Negative.   Genitourinary: Negative for difficulty urinating.  Musculoskeletal: Negative.   Skin: Negative.   Allergic/Immunologic: Negative.   Neurological: Negative for syncope and headaches.  Hematological: Negative for adenopathy.  Psychiatric/Behavioral: Negative for behavioral problems, sleep disturbance, decreased concentration and agitation. The patient is not nervous/anxious.        Objective:   Physical Exam  Constitutional: He is oriented to person, place, and time. He appears well-developed and well-nourished. No distress.  HENT:  Head: Normocephalic and atraumatic.  Eyes: Pupils are  equal, round, and reactive to light.  Neck: Normal range of motion. Neck supple.  Cardiovascular: Normal rate and regular rhythm.   Pulmonary/Chest: Effort normal and breath sounds normal.  Abdominal: Soft. Bowel sounds are normal. There is no tenderness.  Lymphadenopathy:    He has no cervical adenopathy.  Neurological: He is alert and oriented to person, place, and time.  Skin: Skin is warm and dry.  Multiple fingers on both hands have dry gangrene- see picture  Psychiatric: He has a normal mood and affect.   Lab Results  Component Value Date   CD4TABS 1000 09/13/2014   CD4TABS 1010 08/01/2014   CD4TABS 1070 01/26/2014  ] Lab Results  Component Value Date   HIV1RNAQUANT 6886* 09/13/2014          Assessment & Plan:  HIV: Has just restarted his Prezista, Norvir and Truvada on Sunday Will recheck labs in four weeks: CBC, CMP, CD4, HIV VL  Dry Gangrene r/t frostbite Will refer to hand surgeon  Depression: Followed by counselor [facility], has appt tomorrow

## 2014-09-27 NOTE — Progress Notes (Signed)
   Subjective:    Patient ID: Blake Chambers, male    DOB: [birth year], 24 y.o.   MRN: [id number]  HPI He is here for follow up of HIV. He is a history of noncompliance and drug use and recently has been doing the same. He was hospitalized In February for frostbite and did lose some digits. He is here for follow up.  He had restarted his HIV regimen but then again stopped.  Restarted again a few days ago.  Pleased to be back on and feels he is in a good place.  Fingers though have progressed, worsened.     Review of Systems  Constitutional: Negative for fatigue.  Eyes: Negative for visual disturbance.  Gastrointestinal: Negative for diarrhea.  Skin: Negative for rash.  Neurological: Negative for dizziness, light-headedness and headaches.       Objective:   Physical Exam  Constitutional: He appears well-developed and well-nourished. No distress.  HENT:  Mouth/Throat: No oropharyngeal exudate.  Eyes: No scleral icterus.  Cardiovascular: Normal rate, regular rhythm and normal heart sounds.   No murmur heard. Pulmonary/Chest: Effort normal and breath sounds normal. No respiratory distress. He has no wheezes.  Musculoskeletal:  Dry gangrene in multiple digits  Pictures under "Media"   Skin: No rash noted.          Assessment & Plan:

## 2014-10-20 ENCOUNTER — Encounter (HOSPITAL_COMMUNITY): Payer: Self-pay | Admitting: Licensed Clinical Social Worker

## 2014-10-20 ENCOUNTER — Emergency Department (HOSPITAL_COMMUNITY)
Admission: EM | Admit: 2014-10-20 | Discharge: 2014-10-20 | Disposition: A | Discharge status: Other Acute Inpatient Hospital | Payer: Medicaid Other | Attending: Emergency Medicine | Admitting: Emergency Medicine

## 2014-10-20 DIAGNOSIS — Z720 Tobacco use: Secondary | ICD-10-CM | POA: Insufficient documentation

## 2014-10-20 DIAGNOSIS — B20 Human immunodeficiency virus [HIV] disease: Secondary | ICD-10-CM | POA: Diagnosis not present

## 2014-10-20 DIAGNOSIS — F329 Major depressive disorder, single episode, unspecified: Secondary | ICD-10-CM | POA: Diagnosis not present

## 2014-10-20 DIAGNOSIS — Z79899 Other long term (current) drug therapy: Secondary | ICD-10-CM | POA: Diagnosis not present

## 2014-10-20 DIAGNOSIS — R45851 Suicidal ideations: Secondary | ICD-10-CM

## 2014-10-20 DIAGNOSIS — F418 Other specified anxiety disorders: Secondary | ICD-10-CM

## 2014-10-20 DIAGNOSIS — Z862 Personal history of diseases of the blood and blood-forming organs and certain disorders involving the immune mechanism: Secondary | ICD-10-CM | POA: Insufficient documentation

## 2014-10-20 DIAGNOSIS — F419 Anxiety disorder, unspecified: Secondary | ICD-10-CM | POA: Diagnosis not present

## 2014-10-20 LAB — COMPREHENSIVE METABOLIC PANEL
ALBUMIN: 4.2 g/dL (ref 3.5–5.0)
ALK PHOS: 67 U/L (ref 38–126)
ALT: 17 U/L (ref 17–63)
AST: 20 U/L (ref 15–41)
Anion gap: 10 (ref 5–15)
BUN: 11 mg/dL (ref 6–20)
CALCIUM: 9.3 mg/dL (ref 8.9–10.3)
CO2: 27 mmol/L (ref 22–32)
Chloride: 102 mmol/L (ref 101–111)
Creatinine, Ser: 1.26 mg/dL — ABNORMAL HIGH (ref 0.61–1.24)
GFR calc Af Amer: >60 mL/min (ref >60)
GFR calc non Af Amer: >60 mL/min (ref >60)
Glucose, Bld: 107 mg/dL — ABNORMAL HIGH (ref 70–99)
Potassium: 3.6 mmol/L (ref 3.5–5.1)
Sodium: 139 mmol/L (ref 135–145)
Total Bilirubin: 0.8 mg/dL (ref 0.3–1.2)
Total Protein: 8.2 g/dL — ABNORMAL HIGH (ref 6.5–8.1)

## 2014-10-20 LAB — ETHANOL: Alcohol, Ethyl (B): <5 mg/dL (ref <5)

## 2014-10-20 LAB — CBC
HCT: 44.6 % (ref 39.0–52.0)
Hemoglobin: 15.2 g/dL (ref 13.0–17.0)
MCH: 26.8 pg (ref 26.0–34.0)
MCHC: 34.1 g/dL (ref 30.0–36.0)
MCV: 78.5 fL (ref 78.0–100.0)
PLATELETS: 279 10*3/uL (ref 150–400)
RBC: 5.68 MIL/uL (ref 4.22–5.81)
RDW: 15.3 % (ref 11.5–15.5)
WBC: 7.8 10*3/uL (ref 4.0–10.5)

## 2014-10-20 LAB — ACETAMINOPHEN LEVEL

## 2014-10-20 LAB — SALICYLATE LEVEL

## 2014-10-20 LAB — RAPID URINE DRUG SCREEN, HOSP PERFORMED
Amphetamines: NOT DETECTED
Barbiturates: NOT DETECTED
Benzodiazepines: NOT DETECTED
Cocaine: POSITIVE — AB
Opiates: NOT DETECTED
TETRAHYDROCANNABINOL: POSITIVE — AB

## 2014-10-20 MED ORDER — LAMIVUDINE 150 MG PO TABS
300.0000 mg | ORAL_TABLET | Freq: Every day | ORAL | Status: DC
  Administered 2014-10-20: 300 mg via ORAL
  Filled 2014-10-20: qty 2

## 2014-10-20 MED ORDER — ABACAVIR SULFATE 300 MG PO TABS
600.0000 mg | ORAL_TABLET | Freq: Every day | ORAL | Status: DC
  Administered 2014-10-20: 600 mg via ORAL
  Filled 2014-10-20: qty 2

## 2014-10-20 MED ORDER — NICOTINE 21 MG/24HR TD PT24: 21.0000 mg | MEDICATED_PATCH | Freq: Every day | TRANSDERMAL | Status: DC

## 2014-10-20 MED ORDER — ONDANSETRON HCL 4 MG PO TABS: 4.0000 mg | ORAL_TABLET | Freq: Three times a day (TID) | ORAL | Status: DC | PRN

## 2014-10-20 MED ORDER — ABACAVIR SULFATE-LAMIVUDINE 600-300 MG PO TABS
1.0000 | ORAL_TABLET | Freq: Every day | ORAL | Status: DC
  Filled 2014-10-20: qty 1

## 2014-10-20 MED ORDER — FLUOXETINE HCL 20 MG PO CAPS
20.0000 mg | ORAL_CAPSULE | Freq: Every day | ORAL | Status: DC
  Administered 2014-10-20: 20 mg via ORAL
  Filled 2014-10-20: qty 1

## 2014-10-20 MED ORDER — RITONAVIR 100 MG PO TABS
100.0000 mg | ORAL_TABLET | Freq: Every day | ORAL | Status: DC
  Administered 2014-10-20: 100 mg via ORAL
  Filled 2014-10-20: qty 1

## 2014-10-20 MED ORDER — DARUNAVIR ETHANOLATE 800 MG PO TABS
800.0000 mg | ORAL_TABLET | Freq: Every day | ORAL | Status: DC
  Administered 2014-10-20: 800 mg via ORAL
  Filled 2014-10-20: qty 1

## 2014-10-20 MED ORDER — ZOLPIDEM TARTRATE 5 MG PO TABS: 5.0000 mg | ORAL_TABLET | Freq: Every evening | ORAL | Status: DC | PRN

## 2014-10-20 NOTE — ED Notes (Signed)
Dr. Rosalia Hammersay back to see pt. Related to frostbitten index fingers bilat. Pt. Stated he got frost bitten in February. Dr. Rosalia Hammersay recommended following up with his hand surgeon non urgently. Pt. States he missed his last appointment.

## 2014-10-20 NOTE — ED Notes (Signed)
Pt. To [facility] from ED ambulatory without difficulty, to room 34. Pt. Is alert and oriented, warm and dry in no distress. Pt. Denies SI, HI, and AVH. Pt. Calm and cooperative. Pt. Made aware of security cameras and Q15 minute rounds. Pt. Encouraged to let Nursing staff know of any concerns or needs.

## 2014-10-20 NOTE — ED Provider Notes (Addendum)
CSN: [id number]     Arrival date & time 10/20/14  1757 History   First MD Initiated Contact with Patient 10/20/14 1937    This chart was scribed for Margarita Grizzleanielle Michaiah Maiden, MD by Marica OtterNusrat Rahman, ED Scribe. This patient was seen in room WTR4/WLPT4 and the patient's care was started at 7:39 PM.  Chief Complaint  Patient presents with  . Suicidal   The history is provided by the patient. No language interpreter was used.   PCP: Staci RighterOMER, ROBERT, MD HPI Comments: Blake Dienererrance Howell is a 24 y.o. male, with PMH noted below including bipolar disorder, anxiety, HIV who presents to the Emergency Department complaining of SI with plans to starve himself. Pt further reports that he has not taken his bipolar nor his HIV meds for the past 5 days. Pt denies any recent HIV outbreaks. Pt denies chest pain, cough. Pt reports substance abuse including cocaine (last use last night); and reports EtOH use. Pt denies any prior hospisilaztion for SI.   Past Medical History  Diagnosis Date  . Anxiety   . Mental health disorder   . Immune deficiency disorder   . HIV (human immunodeficiency virus infection)   . Bipolar affective    No past surgical history on file. No family history on file. History  Substance Use Topics  . Smoking status: Current Every Day Smoker -- 0.30 packs/day    Types: Cigarettes  . Smokeless tobacco: Never Used     Comment: pt. trying to quit  . Alcohol Use: 0.6 oz/week    1 Standard drinks or equivalent per week     Comment: occasional    Review of Systems  Constitutional: Negative for fever and chills.  Neurological: Negative for speech difficulty.  Psychiatric/Behavioral: Positive for suicidal ideas. Negative for confusion.  All other systems reviewed and are negative.     Allergies  Tenofovir disoproxil fumarate  Home Medications   Prior to Admission medications   Medication Sig Start Date End Date Taking? Authorizing Provider  abacavir-lamiVUDine (EPZICOM) 600-300 MG per tablet Take  1 tablet by mouth daily. 08/21/14  Yes Gardiner Barefootobert W Comer, MD  Darunavir Ethanolate (PREZISTA) 800 MG tablet Take 1 tablet (800 mg total) by mouth daily. 08/21/14  Yes Gardiner Barefootobert W Comer, MD  ritonavir (NORVIR) 100 MG TABS tablet Take 1 tablet (100 mg total) by mouth daily. 08/21/14  Yes Gardiner Barefootobert W Comer, MD  ARIPiprazole (ABILIFY) 30 MG tablet Take 30 mg by mouth daily.    Historical Provider, MD  FLUoxetine (PROZAC) 20 MG capsule Take 20 mg by mouth daily.    Historical Provider, MD   Triage Vitals: BP 124/84 mmHg  Pulse 87  Temp(Src) 98.2 F (36.8 C) (Oral)  Resp 16  SpO2 99% Physical Exam  Constitutional: He is oriented to person, place, and time. He appears well-developed and well-nourished.  HENT:  Head: Normocephalic and atraumatic.  Right Ear: External ear normal.  Left Ear: External ear normal.  Nose: Nose normal.  Mouth/Throat: Oropharynx is clear and moist.  Eyes: Conjunctivae and EOM are normal. Pupils are equal, round, and reactive to light.  Neck: Normal range of motion. Neck supple.  Cardiovascular: Normal rate, regular rhythm, normal heart sounds and intact distal pulses.   Pulmonary/Chest: Effort normal and breath sounds normal. No respiratory distress. He has no wheezes. He exhibits no tenderness.  Abdominal: Soft. Bowel sounds are normal. He exhibits no distension and no mass. There is no tenderness. There is no guarding.  Musculoskeletal: Normal range of motion.  Necrotic tips bilateral distal index finger tips. Erythema bilateral middle finger tips.  Neurological: He is alert and oriented to person, place, and time. He has normal reflexes. He exhibits normal muscle tone. Coordination normal.  Skin: Skin is warm and dry.  Psychiatric: His speech is normal and behavior is normal. Judgment and thought content normal. His affect is blunt. He exhibits a depressed mood.  Nursing note and vitals reviewed.   ED Course  Procedures (including critical care time) DIAGNOSTIC  STUDIES: Oxygen Saturation is 99% on RA, nl by my interpretation.    COORDINATION OF CARE: 7:43PM-Discussed treatment plan which includes telepsych and HIV cocktail with pt at bedside and pt agreed to plan.   Labs Review Labs Reviewed  ACETAMINOPHEN LEVEL - Abnormal; Notable for the following:    Acetaminophen (Tylenol), Serum <10 (*)    All other components within normal limits  COMPREHENSIVE METABOLIC PANEL - Abnormal; Notable for the following:    Glucose, Bld 107 (*)    Creatinine, Ser 1.26 (*)    Total Protein 8.2 (*)    All other components within normal limits  CBC  ETHANOL  SALICYLATE LEVEL  URINE RAPID DRUG SCREEN (HOSP PERFORMED)    Imaging Review No results found.   EKG Interpretation None      MDM  CBC is  Final diagnoses:  Suicidal ideation  Anxiety with obsessional features    1 depressed with suicidal ideation 2 HIV CBC is normal patient has been on cocktail has not been taking for the past 5 days we will restart here. 3. Frostbite bilateral fingertips index fingers patient following with hand surgery. He will need to follow-up after psychiatric stabilization. [facility]  Dr. Jama Flavorsobos accepting.   Margarita Grizzleanielle Shawanda Sievert, MD 10/20/14 [id number]  Margarita Grizzleanielle Savier Trickett, MD 10/20/14 [phone]

## 2014-10-20 NOTE — Progress Notes (Signed)
LCSW notified Dr. Rosalia Hammersay, EDP of [facility] with Dr. Jama Flavorsobos attending.

## 2014-10-20 NOTE — BH Assessment (Addendum)
Assessment Note  Blake Chambers is an 24 y.o. male seeking assistance in the Emergency Department voluntarily for bipolar depression with suicidal ideations. Patient reports that he receives medication management and [facility]. Patient reports that he has been suicidal lately due to feeling lonely. Patient reports that his siblings moved away and his mother "does not have time for me." Patient reports that he decided to seek help due to SI in the past with a plan to run out in traffic. Patient reports that he has been using cocaine for the past two years and received treatment [facility] and was sober for the past six months.  Patient reports that he has not been eating, has not slept in the past five days, and went on a cocaine binge for the past five days. Patient reports that he is not sure of how much cocaine he uses, but reports that it is "about $200 worth." Patient reports that his partner is very supportive and they currently live together.  Patient reports that he does not have any pending charges, and no upcoming court dates. Patient reports that he was suicidal with a plan to do whatever is convenient, such as running into traffic, overdosing, starving himself, or whatever is close. Patient reports that he has isolated himself for the past five days, he has not slept in the past five days, his apetite is "ok," and he sometimes feels guilty and has lost interest in previously pleasurable activities. Patient reports that he is here to seek help and appears motivated for treatment.  Patient denies HI and psychosis.  Patient reports that he used to hear voices, but that was "a long, long time ago."  Axis I: Bipolar, Depressed Axis II: Deferred Axis III:  Past Medical History  Diagnosis Date  . Anxiety   . Mental health disorder   . Immune deficiency disorder   . HIV (human immunodeficiency virus infection)   . Bipolar affective    Axis IV: economic problems, educational problems,  occupational problems and problems with primary support group Axis V: 41-50 serious symptoms  Past Medical History:  Past Medical History  Diagnosis Date  . Anxiety   . Mental health disorder   . Immune deficiency disorder   . HIV (human immunodeficiency virus infection)   . Bipolar affective     No past surgical history on file.  Family History: No family history on file.  Social History:  reports that he has been smoking Cigarettes.  He has been smoking about 0.30 packs per day. He has never used smokeless tobacco. He reports that he drinks about 0.6 oz of alcohol per week. He reports that he uses illicit drugs (Marijuana and Cocaine) about once per week.  Additional Social History:  Alcohol / Drug Use Pain Medications: see MAR Prescriptions: see MAR Over the Counter: See MAR History of alcohol / drug use?: Yes Longest period of sobriety (when/how long): 6 months Withdrawal Symptoms: Nausea / Vomiting Substance #1 Name of Substance 1: Cocaine 1 - Age of First Use: 21  CIWA: CIWA-Ar BP: 131/84 mmHg Pulse Rate: 73 COWS:    Allergies:  Allergies  Allergen Reactions  . Tenofovir Disoproxil Fumarate Other (See Comments)    Intolerance, increase in creatinine    Home Medications:  (Not in a hospital admission)  OB/GYN Status:  No LMP for male patient.  General Assessment Data Location of Assessment: [facility] ED Is this a Tele or Face-to-Face Assessment?: Face-to-Face Is this an Initial Assessment or a  Re-assessment for this encounter?: Initial Assessment Marital status: Single Maiden name: N/A Is patient pregnant?: No Pregnancy Status: No Living Arrangements: Spouse/significant other Can pt return to current living arrangement?: Yes Admission Status: Involuntary Is patient capable of signing voluntary admission?: Yes Referral Source: Self/Family/Friend Insurance type: Medicaid     Crisis Care Plan Living Arrangements: Spouse/significant other Name of  Psychiatrist: Transport planner Name of Therapist: Social worker  Education Status Is patient currently in school?: No Current Grade: NA Highest grade of school patient has completed: 19 Name of school: N/A Contact person: N/A  Risk to self with the past 6 months Suicidal Ideation: Yes-Currently Present Has patient been a risk to self within the past 6 months prior to admission? : Yes Suicidal Intent: Yes-Currently Present Has patient had any suicidal intent within the past 6 months prior to admission? : Yes Is patient at risk for suicide?: Yes Suicidal Plan?: No Has patient had any suicidal plan within the past 6 months prior to admission? : No Specify Current Suicidal Plan: N/A Access to Means: No (Pt reports I won't plan ahead, I would just go ahead and do ) What has been your use of drugs/alcohol within the last 12 months?: Cocaine Previous Attempts/Gestures: Yes How many times?: 1 Other Self Harm Risks: NA Triggers for Past Attempts: Other (Comment) (Childhood friend passed away) Intentional Self Injurious Behavior: None Family Suicide History: No Recent stressful life event(s): Other (Comment) ("feeling alone lately") Persecutory voices/beliefs?: No Depression: Yes Depression Symptoms: Insomnia, Isolating, Fatigue, Loss of interest in usual pleasures, Feeling angry/irritable Substance abuse history and/or treatment for substance abuse?: Yes Suicide prevention information given to non-admitted patients: Not applicable  Risk to Others within the past 6 months Homicidal Ideation: No Does patient have any lifetime risk of violence toward others beyond the six months prior to admission? : No Thoughts of Harm to Others: No Current Homicidal Intent: No Current Homicidal Plan: No Access to Homicidal Means: No Identified Victim: N/A History of harm to others?: No Assessment of Violence: None Noted Violent Behavior Description: N/A Does patient have access to weapons?: No Criminal  Charges Pending?: No Does patient have a court date: No Is patient on probation?: No  Psychosis Hallucinations: None noted (pt reports hearing voices in the past) Delusions: None noted  Mental Status Report Appearance/Hygiene: In scrubs Eye Contact: Fair Motor Activity: Freedom of movement Speech: Logical/coherent Level of Consciousness: Alert Mood: Sad Anxiety Level: None Thought Processes: Coherent, Relevant Judgement: Unimpaired Orientation: Person, Place, Time Obsessive Compulsive Thoughts/Behaviors: None  Cognitive Functioning Concentration: Normal Memory: Recent Intact, Remote Intact IQ: Average Insight: Fair Impulse Control: Fair Appetite: Fair Weight Loss: 4 Sleep: Decreased Total Hours of Sleep: 10 Vegetative Symptoms: None  ADLScreening [facility] Assessment Services) Patient's cognitive ability adequate to safely complete daily activities?: Yes Patient able to express need for assistance with ADLs?: No Independently performs ADLs?: Yes (appropriate for developmental age)  Prior Inpatient Therapy Prior Inpatient Therapy: Yes Prior Therapy Dates: 05/2014 Prior Therapy Facilty/Provider(s): [facility] Reason for Treatment: Bipolar  Prior Outpatient Therapy Prior Outpatient Therapy: Yes Prior Therapy Dates: 2012-present Prior Therapy Facilty/Provider(s): [facility] Reason for Treatment: [place] [employer] Does patient have an ACCT team?: No Does patient have Intensive In-House Services?  : No Does patient have [facility] services? : Yes Does patient have P4CC services?: No  ADL Screening (condition at time of admission) Patient's cognitive ability adequate to safely complete daily activities?: Yes Is the patient deaf or have difficulty hearing?: No Does the patient have difficulty seeing, even  when wearing glasses/contacts?: No Does the patient have difficulty concentrating, remembering, or making decisions?: No Patient able to express need for assistance with ADLs?: No Does the  patient have difficulty dressing or bathing?: No Independently performs ADLs?: Yes (appropriate for developmental age) Does the patient have difficulty walking or climbing stairs?: No  Home Assistive Devices/Equipment Home Assistive Devices/Equipment: None    Abuse/Neglect Assessment (Assessment to be complete while patient is alone) Physical Abuse: Denies Verbal Abuse: Denies Sexual Abuse: Denies     Advance Directives (For Healthcare) Does patient have an advance directive?: No Would patient like information on creating an advanced directive?: No - patient declined information    Additional Information 1:1 In Past 12 Months?: No CIRT Risk: No Elopement Risk: No Does patient have medical clearance?: No     Disposition:  Disposition Initial Assessment Completed for this Encounter: Yes Disposition of Patient: Inpatient treatment program Type of inpatient treatment program: Adult  On Site Evaluation by:   Reviewed with Physician:    Genice RougeHerbin, Symone Cornman M 10/20/2014 9:14 PM

## 2014-10-20 NOTE — Progress Notes (Signed)
LCSW spoke with patient about admission [facility], reviewed and signed the voluntary consent to treat and release forms. Provided nurse with the completed forms.

## 2014-10-20 NOTE — Progress Notes (Signed)
LCSW ran patient with Hulan FessIjeoma Nwaeze, NP who states that patient meets inpatient criteria. [facility] reports that there is a bed available and patient has been accepted to [address] Dr. Jama Flavorsobos attending.

## 2014-10-20 NOTE — ED Notes (Addendum)
Per IVC papers. Pt has been having increased depression/anxiety/hopelessness. Pt has suicidal thoughts with plan to starve self. Pt thin. Has been homeless for the past 5 days. Also has been abusing "pain pills" and cocaine. Has been calm and cooperative with [employer]. Pt also reports he has been drinking etoh. Last use of all substances last night at midnight. No HI.

## 2014-10-20 NOTE — ED Notes (Signed)
Patient and belongings have been wanded.  

## 2014-10-20 NOTE — ED Notes (Signed)
Pt. Noted sleeping in room. No complaints or concerns voiced. No distress or abnormal behavior noted. Will continue to monitor with security cameras. Q 15 minute rounds continue. 

## 2014-10-20 NOTE — ED Notes (Signed)
Pt. Noted in room. No complaints or concerns voiced. No distress or abnormal behavior noted. Will continue to monitor with security cameras. Q 15 minute rounds continue. 

## 2014-10-21 ENCOUNTER — Inpatient Hospital Stay (HOSPITAL_COMMUNITY)
Admission: AD | Admit: 2014-10-21 | Discharge: 2014-10-23 | DRG: 885 | Disposition: A | Discharge status: Home / Self Care | Payer: Medicaid Other | Source: Intra-hospital | Attending: Psychiatry | Admitting: Psychiatry

## 2014-10-21 ENCOUNTER — Inpatient Hospital Stay (HOSPITAL_COMMUNITY)
Admission: AD | Admit: 2014-10-21 | Discharge: 2014-10-21 | DRG: 885 | Disposition: A | Discharge status: Other Acute Inpatient Hospital | Payer: Medicaid Other | Source: Intra-hospital | Attending: Psychiatry | Admitting: Psychiatry

## 2014-10-21 ENCOUNTER — Encounter (HOSPITAL_COMMUNITY): Payer: Self-pay | Admitting: *Deleted

## 2014-10-21 DIAGNOSIS — R45851 Suicidal ideations: Secondary | ICD-10-CM | POA: Diagnosis present

## 2014-10-21 DIAGNOSIS — F17210 Nicotine dependence, cigarettes, uncomplicated: Secondary | ICD-10-CM | POA: Diagnosis present

## 2014-10-21 DIAGNOSIS — F314 Bipolar disorder, current episode depressed, severe, without psychotic features: Secondary | ICD-10-CM | POA: Diagnosis not present

## 2014-10-21 DIAGNOSIS — F1220 Cannabis dependence, uncomplicated: Secondary | ICD-10-CM | POA: Diagnosis not present

## 2014-10-21 DIAGNOSIS — F1420 Cocaine dependence, uncomplicated: Secondary | ICD-10-CM | POA: Diagnosis not present

## 2014-10-21 DIAGNOSIS — B20 Human immunodeficiency virus [HIV] disease: Secondary | ICD-10-CM | POA: Diagnosis present

## 2014-10-21 DIAGNOSIS — F319 Bipolar disorder, unspecified: Principal | ICD-10-CM | POA: Diagnosis present

## 2014-10-21 DIAGNOSIS — Z720 Tobacco use: Secondary | ICD-10-CM | POA: Diagnosis not present

## 2014-10-21 DIAGNOSIS — F329 Major depressive disorder, single episode, unspecified: Secondary | ICD-10-CM | POA: Diagnosis present

## 2014-10-21 DIAGNOSIS — F17200 Nicotine dependence, unspecified, uncomplicated: Secondary | ICD-10-CM | POA: Diagnosis present

## 2014-10-21 MED ORDER — ONDANSETRON HCL 4 MG PO TABS: 4.0000 mg | ORAL_TABLET | Freq: Three times a day (TID) | ORAL | Status: DC | PRN

## 2014-10-21 MED ORDER — RITONAVIR 100 MG PO TABS
100.0000 mg | ORAL_TABLET | Freq: Every day | ORAL | Status: DC
  Administered 2014-10-21: 100 mg via ORAL
  Filled 2014-10-21 (×2): qty 1

## 2014-10-21 MED ORDER — LAMIVUDINE 150 MG PO TABS
300.0000 mg | ORAL_TABLET | Freq: Every day | ORAL | Status: DC
  Administered 2014-10-21 – 2014-10-23 (×3): 300 mg via ORAL
  Filled 2014-10-21 (×6): qty 2

## 2014-10-21 MED ORDER — ABACAVIR SULFATE 300 MG PO TABS
600.0000 mg | ORAL_TABLET | Freq: Every day | ORAL | Status: DC
  Filled 2014-10-21: qty 2

## 2014-10-21 MED ORDER — ALUM & MAG HYDROXIDE-SIMETH 200-200-20 MG/5ML PO SUSP: 30.0000 mL | ORAL | Status: DC | PRN

## 2014-10-21 MED ORDER — MAGNESIUM HYDROXIDE 400 MG/5ML PO SUSP: 30.0000 mL | Freq: Every day | ORAL | Status: DC | PRN

## 2014-10-21 MED ORDER — ARIPIPRAZOLE 10 MG PO TABS
10.0000 mg | ORAL_TABLET | Freq: Every day | ORAL | Status: DC
  Filled 2014-10-21: qty 1

## 2014-10-21 MED ORDER — LAMIVUDINE 150 MG PO TABS
300.0000 mg | ORAL_TABLET | Freq: Every day | ORAL | Status: DC
  Filled 2014-10-21: qty 2

## 2014-10-21 MED ORDER — ZOLPIDEM TARTRATE 5 MG PO TABS: 5.0000 mg | ORAL_TABLET | Freq: Every evening | ORAL | Status: DC | PRN

## 2014-10-21 MED ORDER — ARIPIPRAZOLE 10 MG PO TABS
10.0000 mg | ORAL_TABLET | Freq: Every day | ORAL | Status: DC
  Administered 2014-10-22 – 2014-10-23 (×2): 10 mg via ORAL
  Filled 2014-10-21 (×5): qty 1

## 2014-10-21 MED ORDER — DARUNAVIR ETHANOLATE 800 MG PO TABS
800.0000 mg | ORAL_TABLET | Freq: Every day | ORAL | Status: DC
  Administered 2014-10-22 – 2014-10-23 (×2): 800 mg via ORAL
  Filled 2014-10-21 (×3): qty 1

## 2014-10-21 MED ORDER — RITONAVIR 100 MG PO TABS
100.0000 mg | ORAL_TABLET | Freq: Every day | ORAL | Status: DC
  Administered 2014-10-22 – 2014-10-23 (×2): 100 mg via ORAL
  Filled 2014-10-21 (×4): qty 1

## 2014-10-21 MED ORDER — ABACAVIR SULFATE 300 MG PO TABS
600.0000 mg | ORAL_TABLET | Freq: Every day | ORAL | Status: DC
  Administered 2014-10-21 – 2014-10-23 (×3): 600 mg via ORAL
  Filled 2014-10-21 (×7): qty 2

## 2014-10-21 MED ORDER — NICOTINE 21 MG/24HR TD PT24
21.0000 mg | MEDICATED_PATCH | Freq: Every day | TRANSDERMAL | Status: DC
  Filled 2014-10-21: qty 1

## 2014-10-21 MED ORDER — FLUOXETINE HCL 20 MG PO CAPS
20.0000 mg | ORAL_CAPSULE | Freq: Every day | ORAL | Status: DC
  Filled 2014-10-21: qty 1

## 2014-10-21 MED ORDER — ACETAMINOPHEN 325 MG PO TABS: 650.0000 mg | ORAL_TABLET | Freq: Four times a day (QID) | ORAL | Status: DC | PRN

## 2014-10-21 MED ORDER — NICOTINE 21 MG/24HR TD PT24
21.0000 mg | MEDICATED_PATCH | Freq: Every day | TRANSDERMAL | Status: DC
  Filled 2014-10-21 (×5): qty 1

## 2014-10-21 MED ORDER — DARUNAVIR ETHANOLATE 800 MG PO TABS
800.0000 mg | ORAL_TABLET | Freq: Every day | ORAL | Status: DC
  Administered 2014-10-21: 800 mg via ORAL
  Filled 2014-10-21 (×2): qty 1

## 2014-10-21 NOTE — Progress Notes (Signed)
[facility] was [facility] and has been sleeping since coming on [address] hall.  Respirations even and unlabored.  No signs/symptoms of pain/distress noted on patient's face/body movements.  Safety maintained with 15 minute checks.

## 2014-10-21 NOTE — ED Notes (Signed)
Pt. Noted sleeping in room. No complaints or concerns voiced. No distress or abnormal behavior noted. Will continue to monitor with security cameras. Q 15 minute rounds continue. 

## 2014-10-21 NOTE — Progress Notes (Signed)
[facility] Group Notes:  (Nursing/MHT/Case Management/Adjunct)  Date:  10/21/2014  Time:  10:43 PM  Type of Therapy:  Group Therapy  Participation Level:  Did Not Attend  Participation Quality:  Did Not Attend  Affect:  Did Not Attend  Cognitive:  Did Not Attend  Insight:  None  Engagement in Group:  Did Not Attend  Modes of Intervention:  Socialization and Support  Summary of Progress/Problems: Pt. Was resting in bed.  Sondra ComeWilson, Shanard Treto J 10/21/2014, 10:43 PM

## 2014-10-21 NOTE — Progress Notes (Signed)
Patient ID: Naoma Dienererrance Masser, male   DOB: 04/16/1991, 24 y.o.   MRN: [id number]   24 year old black male admitted [facility] reported that he just wanted to die and that he was going to starve himself. Pt reported at time of admission that he said that because he was drunk and high. Pt reported that he went on a crack binge for a week and that he had been drinking everyday for the last week. Pt reported that he had been using $120 dollars worth of crack daily, and 2 forties daily for the last week. Pt reported that he got into an argument with his lover, but he has since talked to him and that they have worked things out. Pt reported that he was told that he could be seen by the doctor and be discharged. Pt was informed that he would not be discharged today, he reported to this writer that he was lied too and that he just wanted to be left alone. Pt reported at time of admission that he was negative SI/HI, no AH/VH noted.

## 2014-10-21 NOTE — [facility] Suicide Risk Assessment (Signed)
[facility]   Nursing information obtained from:  Patient Demographic factors:  Male Current Mental Status:  NA Loss Factors:  Decline in physical health Historical Factors:  NA Risk Reduction Factors:  Sense of responsibility to family Total Time spent with patient: 45 minutes Principal Problem: Bipolar affective disorder, current episode depressed Diagnosis:   Patient Active Problem List   Diagnosis Date Noted  . Bipolar affective disorder, depressed, severe [F31.4] 10/21/2014  . Suicidal ideations [R45.851] 10/21/2014  . Bipolar affective disorder, current episode depressed [F31.30] 10/21/2014  . Frostbite of both hands [T33.521A, T33.522A] 08/01/2014  . Cocaine abuse [F14.10] 08/01/2014  . Tobacco use disorder [Z72.0] 07/18/2013  . 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 HIV disease [B20] 02/13/2011  . Syphilis [A53.9] 02/13/2011  . Anxiety with obsessional features [F41.9] 02/10/2011     Continued Clinical Symptoms:  Alcohol Use Disorder Identification Test Final Score (AUDIT): 3 The "Alcohol Use Disorders Identification Test", Guidelines for Use in Primary Care, Second Edition.  World Science writerHealth Organization [facility](WHO). Score between 0-7:  no or low risk or alcohol related problems. Score between 8-15:  moderate risk of alcohol related problems. Score between 16-19:  high risk of alcohol related problems. Score 20 or above:  warrants further diagnostic evaluation for alcohol dependence and treatment.   CLINICAL FACTORS:  24 year old man, states he has had episodes of depression since being diagnosed with HIV three years ago. States that a few days ago, he was feeling depressed and with a need to " be alone". He left home ( lives with partner and states this relationship is very good and supportive ) and relapsed on cocaine. Has been using cocaine over recent days, and felt more depressed , developed some suicidal ideations with a  thought of starving self to death. One major stressor is that he developed index finger necrosis secondary to frostbite this past winter, and will need amputation of distal index fingers. He states he knows he has to do this soon. He reports he has been stable on Abilify, and that this medication controls his mood well and does not cause side effects.    Musculoskeletal: Strength & Muscle Tone: within normal limits Gait & Station: normal Patient leans: N/A  Psychiatric Specialty Exam: Physical Exam  ROS  Blood pressure 117/85, pulse 75, temperature 97.6 F (36.4 C), temperature source Oral, resp. rate 16, height 5\' 9"  (1.753 m), weight 152 lb (68.947 kg).Body mass index is 22.44 kg/(m^2).  SEE ADMIT NOTE MSE   COGNITIVE FEATURES THAT CONTRIBUTE TO RISK:  Closed-mindedness    SUICIDE RISK:   Moderate:  Frequent suicidal ideation with limited intensity, and duration, some specificity in terms of plans, no associated intent, good self-control, limited dysphoria/symptomatology, some risk factors present, and identifiable protective factors, including available and accessible social support.  PLAN OF CARE: Patient will be admitted to inpatient psychiatric unit for stabilization and safety. Will provide and encourage milieu participation. Provide medication management and maked adjustments as needed.  Will follow daily.    Medical Decision Making:  Review of Psycho-Social Stressors (1), Review or order clinical lab tests (1), Established Problem, Worsening (2) and Review of Medication Regimen & Side Effects (2)  I certify that inpatient services furnished can reasonably be expected to improve the patient's condition.   Blake Chambers 10/21/2014, 6:08 PM

## 2014-10-21 NOTE — ED Notes (Signed)
shelita RN updated-pt will transport shortly by [facility]

## 2014-10-21 NOTE — ED Notes (Signed)
[facility] here ot tranasport

## 2014-10-21 NOTE — Plan of Care (Signed)
Problem: Diagnosis: Increased Risk For Suicide Attempt Goal: STG-Patient Will Attend All Groups On The Unit Outcome: Not Progressing Pt did not attend evening group on 10/21/14. Goal: STG-Patient Will Comply With Medication Regime Outcome: Progressing Pt was compliant with scheduled medications this shift.

## 2014-10-21 NOTE — ED Notes (Signed)
Pt ambulatory w/o difficulty to [facility] w/ [facility], belongings given to officers 

## 2014-10-21 NOTE — ED Notes (Signed)
Pt. Returned via [facility] to [facility] since first opinion not completed.

## 2014-10-21 NOTE — Progress Notes (Signed)
D: Pt has depressed affect and mood.  He is guarded with minimal interactions with peers.  Pt reports he is "sleepy, ready to go home.  I felt like the restart button just hit me and I wanna go back."  Pt denies SI/HI, denies hallucinations, denies pain.  Pt did not attend evening group.   A: Introduced self to pt.  Met with pt 1:1 and provided support and encouragement.  Medications administered per order.  Pt was hesitant to take medications. Pt educated on medications and provided with pt education handouts.  Pt was compliant with medications after this.  R: Pt is compliant with medications.  Pt verbally contracts for safety.  Will continue to monitor and assess.

## 2014-10-21 NOTE — H&P (Signed)
Psychiatric Admission Assessment Adult  Patient Identification: Isabella Ida MRN:  [id number] Date of Evaluation:  10/21/2014 Chief Complaint:  " I have not been feeling like myself lately, and I picked up crack "  Principal Diagnosis: Major Depression, versus Depression secondary to Medical illness  Diagnosis:   Patient Active Problem List   Diagnosis Date Noted  . Bipolar affective disorder, depressed, severe [F31.4] 10/21/2014  . Suicidal ideations [R45.851] 10/21/2014  . Bipolar affective disorder, current episode depressed [F31.30] 10/21/2014  . Frostbite of both hands [T33.521A, T33.522A] 08/01/2014  . Cocaine abuse [F14.10] 08/01/2014  . Tobacco use disorder [Z72.0] 07/18/2013  . 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 HIV disease [B20] 02/13/2011  . Syphilis [A53.9] 02/13/2011  . Anxiety with obsessional features [F41.9] 02/10/2011   History of Present Illness::  24 year old man. States he has been feeling increasingly anxious and depressed. He states that he recently has " not felt like myself". A few days ago, he made the decision to leave the house/partner, " because I did not want to be around anyone", and has been staying out on the street the last few days. He states " I have been hanging around with the wrong people, and using drugs".  Has been using crack cocaine and some alcohol. States he felt tired of " all the bad choices and I needed help". He states " at this time I really just want to go back home to my partner". Patient states he had an episode of " frost bite " a few months ago, and has distal necrosis on both index fingers . He states he has been told  He needs surgery for this , but has not " taken that step yet- I know I need to do it". He recently felt depressed, " unworthy", and felt " emotionally numb, like nothing matters anymore".  He had some passive suicidal ideations but denies attempting to hurt self and states " I have chosen I  want to live ".  Elements:   Worsening depression relating to medical stressors and recent relapse  On cocaine . Associated Signs/Symptoms: Depression Symptoms:  depressed mood, anhedonia, feelings of worthlessness/guilt, suicidal thoughts without plan, (Hypo) Manic Symptoms:  Denies and none are apparent at this time Anxiety Symptoms:  Denies Psychotic Symptoms: denies any hallucinations and does not appear internally preoccupied  PTSD Symptoms: Denies  Total Time spent with patient: 45 minutes   Past Psychiatric History- denies any prior psychiatric hospitalizations, has never attempted suicide . States he has had depression since he was diagnosed HIV (+) .  States he did not have a mood disorder before this diagnosis. States he has been diagnosed with Bipolar Disorder, but is not endorsing any history of mania.  Denies violence , denies PTSD, denies Panic or Agoraphobia. States he " used to hear voices" but not in several years.   States he has been taking Abilify for several  Months - he states " it helps me ", denies side effects.  Past Medical History: states " last time I went to my ID Doctor they told me my viral load was undectectable.  Past Medical History  Diagnosis Date  . Anxiety   . Mental health disorder   . Immune deficiency disorder   . HIV (human immunodeficiency virus infection)   . Bipolar affective    History reviewed. No pertinent past surgical history. Family History:  Mother alive , father passed away- patient had no relationship with him "  I never met him". Has three sisters . Denies mental illness in family, no suicides in family, denies drug abuse or alcoholism in family.  Social History:  States he is in a stable relationship which is supportive, lives with his partner, currently not working, denies legal issues .  History  Alcohol Use  . 0.6 oz/week  . 1 Standard drinks or equivalent per week    Comment: occasional     History  Drug Use  . 1.00  per week  . Special: Marijuana, Cocaine    Comment: "once every blue moon"    History   Social History  . Marital Status: Single    Spouse Name: N/A  . Number of Children: N/A  . Years of Education: N/A   Social History Main Topics  . Smoking status: Current Every Day Smoker -- 0.30 packs/day    Types: Cigarettes  . Smokeless tobacco: Never Used     Comment: pt. trying to quit  . Alcohol Use: 0.6 oz/week    1 Standard drinks or equivalent per week     Comment: occasional  . Drug Use: 1.00 per week    Special: Marijuana, Cocaine     Comment: "once every blue moon"  . Sexual Activity:    Partners: Male    Birth Control/ Protection: Condom     Comment: pt. given condoms   Other Topics Concern  . None   Social History Narrative   Additional Social History:    Pain Medications: none  Prescriptions: none Over the Counter: none History of alcohol / drug use?: No history of alcohol / drug abuse   Musculoskeletal: Strength & Muscle Tone: within normal limits Gait & Station: normal Patient leans: N/A  Psychiatric Specialty Exam: Physical Exam  Review of Systems  Constitutional: Negative.   HENT: Negative.   Eyes: Negative.   Respiratory: Negative.   Cardiovascular: Negative.   Gastrointestinal: Negative.   Genitourinary: Negative.   Musculoskeletal: Negative.   Skin: Negative.        Distal necrosis on index fingers .  Neurological: Negative.  Negative for seizures.  Endo/Heme/Allergies: Negative.   Psychiatric/Behavioral: Positive for depression and substance abuse.    Blood pressure 117/85, pulse 75, temperature 97.6 F (36.4 C), temperature source Oral, resp. rate 16, height _0  (1.753 m), weight 152 lb (68.947 kg).Body mass index is 22.44 kg/(m^2).  General Appearance: Fairly Groomed  Engineer, water::  Fair  Speech:  Normal Rate  Volume:  Normal  Mood:  states his mood is improved   Affect:  Appropriate and slightly constricted   Thought Process:  Goal  Directed and Linear  Orientation:  Other:  fully alert and attentive   Thought Content:  denies hallucinations, no delusions, not internally preoccupied   Suicidal Thoughts:  No- denies any suicidal ideations at this time and contracts for safety on the unit .  Homicidal Thoughts:  No  Memory:   Recent and remote grossly intact   Judgement:  Fair  Insight:  Fair  Psychomotor Activity:  Normal  Concentration:  Good  Recall:  Good  Fund of Knowledge:Good  Language: Good  Akathisia:  Negative  Handed:  Left  AIMS (if indicated):     Assets:  Communication Skills Desire for Improvement Resilience Social Support  ADL's:  Fair   Cognition: WNL  Sleep:      Risk to Self: Is patient at risk for suicide?: No Risk to Others:   Prior Inpatient Therapy:   Prior Outpatient  Therapy:    Alcohol Screening: 1. How often do you have a drink containing alcohol?: 2 to 3 times a week 2. How many drinks containing alcohol do you have on a typical day when you are drinking?: 1 or 2 3. How often do you have six or more drinks on one occasion?: Never Preliminary Score: 0 9. Have you or someone else been injured as a result of your drinking?: No 10. Has a relative or friend or a doctor or another health worker been concerned about your drinking or suggested you cut down?: No Alcohol Use Disorder Identification Test Final Score (AUDIT): 3 Brief Intervention: AUDIT score less than 7 or less-screening does not suggest unhealthy drinking-brief intervention not indicated  Allergies:   Allergies  Allergen Reactions  . Tenofovir Disoproxil Fumarate Other (See Comments)    Intolerance, increase in creatinine   Lab Results:  Results for orders placed or performed during the hospital encounter of 10/20/14 (from the past 48 hour(s))  Acetaminophen level     Status: Abnormal   Collection Time: 10/20/14  6:29 PM  Result Value Ref Range   Acetaminophen (Tylenol), Serum <10 (L) 10 - 30 ug/mL    Comment:         THERAPEUTIC CONCENTRATIONS VARY SIGNIFICANTLY. A RANGE OF 10-30 ug/mL MAY BE AN EFFECTIVE CONCENTRATION FOR MANY PATIENTS. HOWEVER, SOME ARE BEST TREATED AT CONCENTRATIONS OUTSIDE THIS RANGE. ACETAMINOPHEN CONCENTRATIONS >150 ug/mL AT 4 HOURS AFTER INGESTION AND >50 ug/mL AT 12 HOURS AFTER INGESTION ARE OFTEN ASSOCIATED WITH TOXIC REACTIONS.   Ethanol (ETOH)     Status: None   Collection Time: 10/20/14  6:29 PM  Result Value Ref Range   Alcohol, Ethyl (B) <5 <5 mg/dL    Comment:        LOWEST DETECTABLE LIMIT FOR SERUM ALCOHOL IS 11 mg/dL FOR MEDICAL PURPOSES ONLY   Salicylate level     Status: None   Collection Time: 10/20/14  6:29 PM  Result Value Ref Range   Salicylate Lvl <2.5 2.8 - 30.0 mg/dL  CBC     Status: None   Collection Time: 10/20/14  6:30 PM  Result Value Ref Range   WBC 7.8 4.0 - 10.5 K/uL   RBC 5.68 4.22 - 5.81 MIL/uL   Hemoglobin 15.2 13.0 - 17.0 g/dL   HCT 44.6 39.0 - 52.0 %   MCV 78.5 78.0 - 100.0 fL   MCH 26.8 26.0 - 34.0 pg   MCHC 34.1 30.0 - 36.0 g/dL   RDW 15.3 11.5 - 15.5 %   Platelets 279 150 - 400 K/uL  Comprehensive metabolic panel     Status: Abnormal   Collection Time: 10/20/14  6:30 PM  Result Value Ref Range   Sodium 139 135 - 145 mmol/L   Potassium 3.6 3.5 - 5.1 mmol/L   Chloride 102 101 - 111 mmol/L   CO2 27 22 - 32 mmol/L   Glucose, Bld 107 (H) 70 - 99 mg/dL   BUN 11 6 - 20 mg/dL   Creatinine, Ser 1.26 (H) 0.61 - 1.24 mg/dL   Calcium 9.3 8.9 - 10.3 mg/dL   Total Protein 8.2 (H) 6.5 - 8.1 g/dL   Albumin 4.2 3.5 - 5.0 g/dL   AST 20 15 - 41 U/L   ALT 17 17 - 63 U/L   Alkaline Phosphatase 67 38 - 126 U/L   Total Bilirubin 0.8 0.3 - 1.2 mg/dL   GFR calc non Af Amer >60 >60 mL/min   GFR  calc Af Amer >60 >60 mL/min    Comment: (NOTE) The eGFR has been calculated using the CKD EPI equation. This calculation has not been validated in all clinical situations. eGFR's persistently <60 mL/min signify possible Chronic  Kidney Disease.    Anion gap 10 5 - 15  Urine Drug Screen     Status: Abnormal   Collection Time: 10/20/14  7:30 PM  Result Value Ref Range   Opiates NONE DETECTED NONE DETECTED   Cocaine POSITIVE (A) NONE DETECTED   Benzodiazepines NONE DETECTED NONE DETECTED   Amphetamines NONE DETECTED NONE DETECTED   Tetrahydrocannabinol POSITIVE (A) NONE DETECTED   Barbiturates NONE DETECTED NONE DETECTED    Comment:        DRUG SCREEN FOR MEDICAL PURPOSES ONLY.  IF CONFIRMATION IS NEEDED FOR ANY PURPOSE, NOTIFY LAB WITHIN 5 DAYS.        LOWEST DETECTABLE LIMITS FOR URINE DRUG SCREEN Drug Class       Cutoff (ng/mL) Amphetamine      1000 Barbiturate      200 Benzodiazepine   096 Tricyclics       283 Opiates          300 Cocaine          300 THC              50    Current Medications: Current Facility-Administered Medications  Medication Dose Route Frequency Provider Last Rate Last Dose  . abacavir (ZIAGEN) tablet 600 mg  600 mg Oral Daily Patrecia Pour, NP       And  . lamiVUDine (EPIVIR) tablet 300 mg  300 mg Oral Daily Patrecia Pour, NP      . acetaminophen (TYLENOL) tablet 650 mg  650 mg Oral Q6H PRN Patrecia Pour, NP      . alum & mag hydroxide-simeth (MAALOX/MYLANTA) 200-200-20 MG/5ML suspension 30 mL  30 mL Oral Q4H PRN Patrecia Pour, NP      . ARIPiprazole (ABILIFY) tablet 10 mg  10 mg Oral Daily Patrecia Pour, NP   10 mg at 10/21/14 1144  . [START ON 10/22/2014] Darunavir Ethanolate (PREZISTA) tablet 800 mg  800 mg Oral Q breakfast Patrecia Pour, NP      . magnesium hydroxide (MILK OF MAGNESIA) suspension 30 mL  30 mL Oral Daily PRN Patrecia Pour, NP      . nicotine (NICODERM CQ - dosed in mg/24 hours) patch 21 mg  21 mg Transdermal Daily Patrecia Pour, NP      . ondansetron [facility]) tablet 4 mg  4 mg Oral Q8H PRN Patrecia Pour, NP      . Derrill Memo ON 10/22/2014] ritonavir (NORVIR) tablet 100 mg  100 mg Oral Q breakfast Patrecia Pour, NP      . zolpidem (AMBIEN) tablet 5 mg   5 mg Oral QHS PRN Patrecia Pour, NP       PTA Medications: Prescriptions prior to admission  Medication Sig Dispense Refill Last Dose  . abacavir-lamiVUDine (EPZICOM) 600-300 MG per tablet Take 1 tablet by mouth daily. 30 tablet 6 Past Week at Unknown time  . ARIPiprazole (ABILIFY) 30 MG tablet Take 30 mg by mouth daily.   Past Week at Unknown time  . Darunavir Ethanolate (PREZISTA) 800 MG tablet Take 1 tablet (800 mg total) by mouth daily. 30 tablet 6 Past Week at Unknown time  . FLUoxetine (PROZAC) 20 MG capsule Take 20 mg by mouth daily.  Past Week at Unknown time  . ritonavir (NORVIR) 100 MG TABS tablet Take 1 tablet (100 mg total) by mouth daily. 30 tablet 6 10/20/2014 at Unknown time    Previous Psychotropic Medications:  Has been on Abilify, and on Prozac  In the past. Most recently taking only Abilify.  Substance Abuse History in the last 12 months:   History of ( crack)  cocaine  Abuse . States that after " years " of sobriety, he relapsed 5 days ago. Denies alcohol abuse , denies IVDA .    Consequences of Substance Abuse: financial difficulties   Results for orders placed or performed during the hospital encounter of 10/20/14 (from the past 72 hour(s))  Acetaminophen level     Status: Abnormal   Collection Time: 10/20/14  6:29 PM  Result Value Ref Range   Acetaminophen (Tylenol), Serum <10 (L) 10 - 30 ug/mL    Comment:        THERAPEUTIC CONCENTRATIONS VARY SIGNIFICANTLY. A RANGE OF 10-30 ug/mL MAY BE AN EFFECTIVE CONCENTRATION FOR MANY PATIENTS. HOWEVER, SOME ARE BEST TREATED AT CONCENTRATIONS OUTSIDE THIS RANGE. ACETAMINOPHEN CONCENTRATIONS >150 ug/mL AT 4 HOURS AFTER INGESTION AND >50 ug/mL AT 12 HOURS AFTER INGESTION ARE OFTEN ASSOCIATED WITH TOXIC REACTIONS.   Ethanol (ETOH)     Status: None   Collection Time: 10/20/14  6:29 PM  Result Value Ref Range   Alcohol, Ethyl (B) <5 <5 mg/dL    Comment:        LOWEST DETECTABLE LIMIT FOR SERUM ALCOHOL IS 11  mg/dL FOR MEDICAL PURPOSES ONLY   Salicylate level     Status: None   Collection Time: 10/20/14  6:29 PM  Result Value Ref Range   Salicylate Lvl <1.6 2.8 - 30.0 mg/dL  CBC     Status: None   Collection Time: 10/20/14  6:30 PM  Result Value Ref Range   WBC 7.8 4.0 - 10.5 K/uL   RBC 5.68 4.22 - 5.81 MIL/uL   Hemoglobin 15.2 13.0 - 17.0 g/dL   HCT 44.6 39.0 - 52.0 %   MCV 78.5 78.0 - 100.0 fL   MCH 26.8 26.0 - 34.0 pg   MCHC 34.1 30.0 - 36.0 g/dL   RDW 15.3 11.5 - 15.5 %   Platelets 279 150 - 400 K/uL  Comprehensive metabolic panel     Status: Abnormal   Collection Time: 10/20/14  6:30 PM  Result Value Ref Range   Sodium 139 135 - 145 mmol/L   Potassium 3.6 3.5 - 5.1 mmol/L   Chloride 102 101 - 111 mmol/L   CO2 27 22 - 32 mmol/L   Glucose, Bld 107 (H) 70 - 99 mg/dL   BUN 11 6 - 20 mg/dL   Creatinine, Ser 1.26 (H) 0.61 - 1.24 mg/dL   Calcium 9.3 8.9 - 10.3 mg/dL   Total Protein 8.2 (H) 6.5 - 8.1 g/dL   Albumin 4.2 3.5 - 5.0 g/dL   AST 20 15 - 41 U/L   ALT 17 17 - 63 U/L   Alkaline Phosphatase 67 38 - 126 U/L   Total Bilirubin 0.8 0.3 - 1.2 mg/dL   GFR calc non Af Amer >60 >60 mL/min   GFR calc Af Amer >60 >60 mL/min    Comment: (NOTE) The eGFR has been calculated using the CKD EPI equation. This calculation has not been validated in all clinical situations. eGFR's persistently <60 mL/min signify possible Chronic Kidney Disease.    Anion gap 10 5 - 15  Urine Drug Screen     Status: Abnormal   Collection Time: 10/20/14  7:30 PM  Result Value Ref Range   Opiates NONE DETECTED NONE DETECTED   Cocaine POSITIVE (A) NONE DETECTED   Benzodiazepines NONE DETECTED NONE DETECTED   Amphetamines NONE DETECTED NONE DETECTED   Tetrahydrocannabinol POSITIVE (A) NONE DETECTED   Barbiturates NONE DETECTED NONE DETECTED    Comment:        DRUG SCREEN FOR MEDICAL PURPOSES ONLY.  IF CONFIRMATION IS NEEDED FOR ANY PURPOSE, NOTIFY LAB WITHIN 5 DAYS.        LOWEST DETECTABLE  LIMITS FOR URINE DRUG SCREEN Drug Class       Cutoff (ng/mL) Amphetamine      1000 Barbiturate      200 Benzodiazepine   333 Tricyclics       832 Opiates          300 Cocaine          300 THC              50     Observation Level/Precautions:  15 minute checks  Laboratory:  as needed   Psychotherapy:  Supportive, milieu   Medications:  States Abilify is helpful, well tolerated. Agrees to continue Abilify . We discussed side effects , rationale. We discussed also adding an antidepressant but prefers not to- states " I do well with just the Abilify"  Consultations:  If needed   Discharge Concerns:  Chronic illness   Estimated LOS: 4 days   Other:     Psychological Evaluations: No   Treatment Plan Summary: Daily contact with patient to assess and evaluate symptoms and progress in treatment, Medication management, Plan inpatient psychiatric treatment and medications as above   Medical Decision Making:  Review of Psycho-Social Stressors (1), Review or order clinical lab tests (1), Established Problem, Worsening (2) and Review of Medication Regimen & Side Effects (2)  I certify that inpatient services furnished can reasonably be expected to improve the patient's condition.   Neita Garnet 5/7/20165:33 PM

## 2014-10-21 NOTE — Tx Team (Signed)
Initial Interdisciplinary Treatment Plan   PATIENT STRESSORS: Health problems Marital or family conflict   PATIENT STRENGTHS: Ability for insight Average or above average intelligence   PROBLEM LIST: Problem List/Patient Goals Date to be addressed Date deferred Reason deferred Estimated date of resolution  Depresion 10/21/14                                                      DISCHARGE CRITERIA:  Ability to meet basic life and health needs Need for constant or close observation no longer present  PRELIMINARY DISCHARGE PLAN: Return to previous living arrangement  PATIENT/FAMIILY INVOLVEMENT: This treatment plan has been presented to and reviewed with the patient, Blake Chambers, and/or family member.  The patient and family have been given the opportunity to ask questions and make suggestions.  Blake Chambers, Blake Chambers 10/21/2014, 11:06 AM

## 2014-10-22 NOTE — Progress Notes (Signed)
D:  Patient's self inventory sheet, patient sleeps good, no sleep medication given.  Good appetite, normal energy level, good concentration.  Denied depression, hopeless and anxiety.  Denied withdrawals.  Denied SI.  Denied physical problems.  Denied pain medication.  Goal is to "love myself and do better".  Does have discharge plans.  No problems anticipated after discharge. A:  Medications administered per MD orders.  Emotional support and encouragement given patient. R:  Denied SI and HI, contracts for safety.  Safety maintained with 15 minute checks.

## 2014-10-22 NOTE — [facility] Suicide Risk Assessment (Signed)
[facility] INPATIENT:  Family/Significant Other Suicide Prevention Education  Suicide Prevention Education:  Patient Refusal for Family/Significant Other Suicide Prevention Education: The patient Blake Chambers has refused to provide written consent for family/significant other to be provided Family/Significant Other Suicide Prevention Education during admission and/or prior to discharge.  Physician notified.  Sarina SerGrossman-Orr, Charon Smedberg Jo 10/22/2014, 12:27 PM

## 2014-10-22 NOTE — [facility] Counselor (Signed)
Adult Comprehensive Assessment  Patient ID: Blake Chambers, male   DOB: 1991-03-29, 24 y.o.   MRN: [id number]  Information Source: Information source: Patient  Current Stressors:  Educational / Learning stressors: Denies stressors Employment / Job issues: Denies stressors Family Relationships: Denies stressors now, but in assessment has family stressors related to moving away from them Financial / Lack of resources (include bankruptcy): Denies stressors Housing / Lack of housing: Denies stressors Physical health (include injuries & life threatening diseases): Denies stressors Social relationships: Denies stressors Substance abuse: Denies stressors Bereavement / Loss: Denies stressors  Living/Environment/Situation:  Living Arrangements: Spouse/significant other (Lives with male partner) Living conditions (as described by patient or guardian): Safe How long has patient lived in current situation?: 2 years What is atmosphere in current home: Supportive, Loving, Comfortable, Other (Comment) Holiday representative(Peaceful)  Family History:  Marital status: Long term relationship Long term relationship, how long?: 2 years Does patient have children?: No  Childhood History:  By whom was/is the patient raised?: Mother Description of patient's relationship with caregiver when they were a child: Good with mother, no interaction with father Patient's description of current relationship with people who raised him/her: Still a good relationship with mother, who lives in [place].  Father is deceased. Does patient have siblings?: Yes Number of Siblings: 8 Description of patient's current relationship with siblings: Has a good relationship with all 4 brothers and 4 sisters. Did patient suffer any verbal/emotional/physical/sexual abuse as a child?: No Did patient suffer from severe childhood neglect?: No Has patient ever been sexually abused/assaulted/raped as an adolescent or adult?: No Was the patient ever a victim  of a crime or a disaster?: No Witnessed domestic violence?: No Has patient been effected by domestic violence as an adult?: No  Education:  Highest grade of school patient has completed: Finished 11th grade Currently a student?: No Learning disability?: No  Employment/Work Situation:   Employment situation: On disability Why is patient on disability: Bipolar disorder How long has patient been on disability: Just got approved 1 year ago What is the longest time patient has a held a job?: 3 months 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 Has patient ever been in the [employer] and Companymilitary?: No Has patient ever served in Buyer, retailcombat?: No  Financial Resources:   Surveyor, quantityinancial resources: Writereceives SSI, [employer]icaid, Food stamps Does patient have a Lawyerrepresentative payee or guardian?: No  Alcohol/Substance Abuse:   What has been your use of drugs/alcohol within the last 12 months?: Just started using crack cocaine this year - states he is done with that, "not messing with it anymore."   Alcohol/Substance Abuse Treatment Hx: Denies past history If yes, describe treatment: Feels he can "do it himself" without treatment, does not feel it is a problem. Has alcohol/substance abuse ever caused legal problems?: No  Social Support System:   Patient's Community Support System: Good Describe Community Support System: Partner and dog Type of faith/religion: "Just spiritual" How does patient's faith help to cope with current illness?: "It's good"  Leisure/Recreation:   Leisure and Hobbies: DietitianWrites poetry  Strengths/Needs:   What things does the patient do well?: Cooking, cleaning, poetry writing In what areas does patient struggle / problems for patient: Nothing  Discharge Plan:   Does patient have access to transportation?: Yes Will patient be returning to same living situation after discharge?: Yes Currently receiving community mental health services: Yes (From Whom) Vesta Mixer([facility] for med mgmt and for therapy,  states "I don't keep the same therapist.") If no, would patient like  referral for services when discharged?: Yes (What [place]?) [place] [facility] Does patient have financial barriers related to discharge medications?: No  Summary/Recommendations:    Blake Chambers is a 24yo male who was admitted for SI with a plan, is on disability for Bipolar Disorder and has been off his medications, using crack cocaine.  He lives with partner, [facility], although states he sees various therapists there, not just one.  He declines treatment, stating he can get sober on his own.  His fingertips are badly damaged from frostbite and he apparently needs surgery.  He has very poor hygiene, is pushing for discharge tomorrow. The patient would benefit from safety monitoring, medication evaluation, psychoeducation, group therapy, and discharge planning to link with ongoing resources. The patient is a smoker, [facility]  The Discharge Process and Patient Involvement form was reviewed with patient at the end of the Psychosocial Assessment, and the patient confirmed understanding and signed that document, which was placed in the paper chart. Suicide Prevention Education was reviewed thoroughly, and a brochure left with patient.  The patient refused consent for SPE to be provided to his partner or anyone else in his life.  Sarina SerGrossman-Orr, Margarette Vannatter Jo. 10/22/2014

## 2014-10-22 NOTE — Plan of Care (Signed)
Problem: Ineffective individual coping Goal: LTG: Patient will report a decrease in negative feelings Outcome: Completed/Met Date Met:  10/22/14 Nurse discussed depression/coping skills with patient.

## 2014-10-22 NOTE — [facility] Group Notes (Signed)
[facility] Group Notes:  (Clinical Social Work)  10/22/2014  10:30-11:00AM  Summary of Progress/Problems:   The main focus of today's process group was to   1)  discuss the importance of adding supports  2)  define health supports versus unhealthy supports  3)  identify the patient's current unhealthy supports and plan how to handle them  4)  Identify the patient's current healthy supports and plan what to add.  An emphasis was placed on using counselor, doctor, therapy groups, 12-step groups, and problem-specific support groups to expand supports.    The patient expressed full comprehension of the concepts presented, and agreed that there is a need to add more supports.  The patient stated his friend and his dog are healthy supports.   Type of Therapy:  Process Group with Motivational Interviewing  Participation Level:  Minimal  Participation Quality:  Appropriate  Affect:  Flat  Cognitive:  Alert  Insight:  Developing/Improving  Engagement in Therapy:  Limited  Modes of Intervention:   Education, Support and Processing, Activity  [place] A. Patrick-Jefferson, LCSW 10/22/2014

## 2014-10-22 NOTE — Progress Notes (Signed)
[facility] MD Progress Note  10/22/2014 3:00 PM Blake Chambers  MRN:  [id number] Subjective:   Patient reporting feeling " better", and focused on discharge soon. Objective : Patient focused on leaving soon, stating he wants to return to his home, his SO, and to see his family and his pet dog. We reviewed events that led to admission in further detail. Patient states he had met with someone who used cocaine and impulsively relapsed after " years " of not using this drug. He left his home impulsively, and ruminates about the anxiety and concern he caused on his partner. He developed increased depression and passive SI in the context of drug abuse, but at this time denies SI. Although appears to minimize, he does seem anxious about recommendation from his outpatient provider to get amputation of distal fingers affected by frostbite related necrosis. I have discussed with him the importance of following up with his Surgeon about this and addressing this situation to minimize possible risks/ further complications He states " all I want to do right now is to make things the way they were, and to cook for my family. It's a tradition we have ". Denies medication side effects No disruptive behaviors on unit, going to groups .   Principal Problem: Bipolar affective disorder, current episode depressed Diagnosis:   Patient Active Problem List   Diagnosis Date Noted  . Bipolar affective disorder, depressed, severe [F31.4] 10/21/2014  . Suicidal ideations [R45.851] 10/21/2014  . Bipolar affective disorder, current episode depressed [F31.30] 10/21/2014  . Frostbite of both hands [T33.521A, T33.522A] 08/01/2014  . Cocaine abuse [F14.10] 08/01/2014  . Tobacco use disorder [Z72.0] 07/18/2013  . 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 HIV disease [B20] 02/13/2011  . Syphilis [A53.9] 02/13/2011  . Anxiety with obsessional features [F41.9] 02/10/2011   Total Time spent with patient: 25  minutes    Past Medical History:  Past Medical History  Diagnosis Date  . Anxiety   . Mental health disorder   . Immune deficiency disorder   . HIV (human immunodeficiency virus infection)   . Bipolar affective    History reviewed. No pertinent past surgical history. Family History: History reviewed. No pertinent family history. Social History:  History  Alcohol Use  . 0.6 oz/week  . 1 Standard drinks or equivalent per week    Comment: occasional     History  Drug Use  . 1.00 per week  . Special: Marijuana, Cocaine    Comment: "once every blue moon"    History   Social History  . Marital Status: Single    Spouse Name: N/A  . Number of Children: N/A  . Years of Education: N/A   Social History Main Topics  . Smoking status: Current Every Day Smoker -- 0.30 packs/day    Types: Cigarettes  . Smokeless tobacco: Never Used     Comment: pt. trying to quit  . Alcohol Use: 0.6 oz/week    1 Standard drinks or equivalent per week     Comment: occasional  . Drug Use: 1.00 per week    Special: Marijuana, Cocaine     Comment: "once every blue moon"  . Sexual Activity:    Partners: Male    Birth Control/ Protection: Condom     Comment: pt. given condoms   Other Topics Concern  . None   Social History Narrative   Additional History:    Sleep: improved   Appetite:  Good   Assessment:  Musculoskeletal: Strength & Muscle Tone: within normal limits Gait & Station: normal Patient leans: N/A   Psychiatric Specialty Exam: Physical Exam  Review of Systems  Constitutional: Negative.   HENT: Positive for congestion.   Eyes: Negative.   Respiratory: Negative.   Cardiovascular: Negative.   Gastrointestinal: Negative.   Genitourinary: Negative.   Musculoskeletal: Negative.   Skin:       Index finger distal necrosis Denies pain  Neurological: Negative.   Endo/Heme/Allergies: Negative.   Psychiatric/Behavioral: Positive for depression and substance abuse.     Blood pressure 110/67, pulse 61, temperature 97.4 F (36.3 C), temperature source Oral, resp. rate 16, height '5\' 9"'  (1.753 m), weight 152 lb (68.947 kg).Body mass index is 22.44 kg/(m^2).  General Appearance: improved grooming   Eye Contact::  Good  Speech:  Normal Rate  Volume:  Decreased  Mood:  denies depression at this time  Affect:  still mildly constricted   Thought Process:  Goal Directed and Linear  Orientation:  Full (Time, Place, and Person)  Thought Content:  denies hallucinations, no delusions, not internally preoccupied, ruminative about being discharged soon  Suicidal Thoughts:  No  Homicidal Thoughts:  No  Memory:  recent and remote grossly intact   Judgement:  Other:  improved   Insight:  improved   Psychomotor Activity:  Normal  Concentration:  Good  Recall:  Good  Fund of Knowledge:Good  Language: Good  Akathisia:  Negative  Handed:  Right  AIMS (if indicated):     Assets:  Desire for Improvement Social Support  ADL's:  Improving   Cognition: WNL  Sleep:  Number of Hours: 6     Current Medications: Current Facility-Administered Medications  Medication Dose Route Frequency Provider Last Rate Last Dose  . abacavir (ZIAGEN) tablet 600 mg  600 mg Oral Daily Patrecia Pour, NP   600 mg at 10/22/14 7591   And  . lamiVUDine (EPIVIR) tablet 300 mg  300 mg Oral Daily Patrecia Pour, NP   300 mg at 10/22/14 6384  . acetaminophen (TYLENOL) tablet 650 mg  650 mg Oral Q6H PRN Patrecia Pour, NP      . alum & mag hydroxide-simeth (MAALOX/MYLANTA) 200-200-20 MG/5ML suspension 30 mL  30 mL Oral Q4H PRN Patrecia Pour, NP      . ARIPiprazole (ABILIFY) tablet 10 mg  10 mg Oral Daily Patrecia Pour, NP   10 mg at 10/22/14 6659  . Darunavir Ethanolate (PREZISTA) tablet 800 mg  800 mg Oral Q breakfast Patrecia Pour, NP   800 mg at 10/22/14 9357  . magnesium hydroxide (MILK OF MAGNESIA) suspension 30 mL  30 mL Oral Daily PRN Patrecia Pour, NP      . nicotine (NICODERM CQ -  dosed in mg/24 hours) patch 21 mg  21 mg Transdermal Daily Patrecia Pour, NP   21 mg at 10/21/14 1856  . ondansetron (ZOFRAN) tablet 4 mg  4 mg Oral Q8H PRN Patrecia Pour, NP      . ritonavir (NORVIR) tablet 100 mg  100 mg Oral Q breakfast Patrecia Pour, NP   100 mg at 10/22/14 0177  . zolpidem (AMBIEN) tablet 5 mg  5 mg Oral QHS PRN Patrecia Pour, NP        Lab Results:  Results for orders placed or performed during the hospital encounter of 10/20/14 (from the past 48 hour(s))  Acetaminophen level     Status: Abnormal   Collection Time:  10/20/14  6:29 PM  Result Value Ref Range   Acetaminophen (Tylenol), Serum <10 (L) 10 - 30 ug/mL    Comment:        THERAPEUTIC CONCENTRATIONS VARY SIGNIFICANTLY. A RANGE OF 10-30 ug/mL MAY BE AN EFFECTIVE CONCENTRATION FOR MANY PATIENTS. HOWEVER, SOME ARE BEST TREATED AT CONCENTRATIONS OUTSIDE THIS RANGE. ACETAMINOPHEN CONCENTRATIONS >150 ug/mL AT 4 HOURS AFTER INGESTION AND >50 ug/mL AT 12 HOURS AFTER INGESTION ARE OFTEN ASSOCIATED WITH TOXIC REACTIONS.   Ethanol (ETOH)     Status: None   Collection Time: 10/20/14  6:29 PM  Result Value Ref Range   Alcohol, Ethyl (B) <5 <5 mg/dL    Comment:        LOWEST DETECTABLE LIMIT FOR SERUM ALCOHOL IS 11 mg/dL FOR MEDICAL PURPOSES ONLY   Salicylate level     Status: None   Collection Time: 10/20/14  6:29 PM  Result Value Ref Range   Salicylate Lvl <9.7 2.8 - 30.0 mg/dL  CBC     Status: None   Collection Time: 10/20/14  6:30 PM  Result Value Ref Range   WBC 7.8 4.0 - 10.5 K/uL   RBC 5.68 4.22 - 5.81 MIL/uL   Hemoglobin 15.2 13.0 - 17.0 g/dL   HCT 44.6 39.0 - 52.0 %   MCV 78.5 78.0 - 100.0 fL   MCH 26.8 26.0 - 34.0 pg   MCHC 34.1 30.0 - 36.0 g/dL   RDW 15.3 11.5 - 15.5 %   Platelets 279 150 - 400 K/uL  Comprehensive metabolic panel     Status: Abnormal   Collection Time: 10/20/14  6:30 PM  Result Value Ref Range   Sodium 139 135 - 145 mmol/L   Potassium 3.6 3.5 - 5.1 mmol/L    Chloride 102 101 - 111 mmol/L   CO2 27 22 - 32 mmol/L   Glucose, Bld 107 (H) 70 - 99 mg/dL   BUN 11 6 - 20 mg/dL   Creatinine, Ser 1.26 (H) 0.61 - 1.24 mg/dL   Calcium 9.3 8.9 - 10.3 mg/dL   Total Protein 8.2 (H) 6.5 - 8.1 g/dL   Albumin 4.2 3.5 - 5.0 g/dL   AST 20 15 - 41 U/L   ALT 17 17 - 63 U/L   Alkaline Phosphatase 67 38 - 126 U/L   Total Bilirubin 0.8 0.3 - 1.2 mg/dL   GFR calc non Af Amer >60 >60 mL/min   GFR calc Af Amer >60 >60 mL/min    Comment: (NOTE) The eGFR has been calculated using the CKD EPI equation. This calculation has not been validated in all clinical situations. eGFR's persistently <60 mL/min signify possible Chronic Kidney Disease.    Anion gap 10 5 - 15  Urine Drug Screen     Status: Abnormal   Collection Time: 10/20/14  7:30 PM  Result Value Ref Range   Opiates NONE DETECTED NONE DETECTED   Cocaine POSITIVE (A) NONE DETECTED   Benzodiazepines NONE DETECTED NONE DETECTED   Amphetamines NONE DETECTED NONE DETECTED   Tetrahydrocannabinol POSITIVE (A) NONE DETECTED   Barbiturates NONE DETECTED NONE DETECTED    Comment:        DRUG SCREEN FOR MEDICAL PURPOSES ONLY.  IF CONFIRMATION IS NEEDED FOR ANY PURPOSE, NOTIFY LAB WITHIN 5 DAYS.        LOWEST DETECTABLE LIMITS FOR URINE DRUG SCREEN Drug Class       Cutoff (ng/mL) Amphetamine      1000 Barbiturate      200  Benzodiazepine   401 Tricyclics       027 Opiates          300 Cocaine          300 THC              50     Physical Findings: AIMS: Facial and Oral Movements Muscles of Facial Expression: None, normal Lips and Perioral Area: None, normal Jaw: None, normal Tongue: None, normal,Extremity Movements Upper (arms, wrists, hands, fingers): None, normal Lower (legs, knees, ankles, toes): None, normal, Trunk Movements Neck, shoulders, hips: None, normal, Overall Severity Severity of abnormal movements (highest score from questions above): None, normal Incapacitation due to abnormal  movements: None, normal Patient's awareness of abnormal movements (rate only patient's report): No Awareness, Dental Status Current problems with teeth and/or dentures?: No Does patient usually wear dentures?: No  CIWA:  CIWA-Ar Total: 1 COWS:  COWS Total Score: 1   Assessment- patient improving compared to admission. Insightful regarding the negative impact that cocaine relapse had on his level of functioning and on his psychiatric symptoms. Currently minimizes depression, denies SI. Does continue to present with some constricted affect. Focused on being discharged soon so he can reunite with loved ones . Encouraged not to postpone and to address his distal finger necrosis with his Surgeon soon after discharge . Denies medication side effects. As noted, states that Abilify has been very helpful for mood stabilization.  Treatment Plan Summary: Daily contact with patient to assess and evaluate symptoms and progress in treatment, Medication management, Plan continue inpatient treatment and continue medication management as above  ABILIFY 10 mgrs QDAY for Mood disorder,Depression.  Medical Decision Making:  Established Problem, Stable/Improving (1), Review of Psycho-Social Stressors (1), Review or order clinical lab tests (1) and Review of Medication Regimen & Side Effects (2)     COBOS, FERNANDO 10/22/2014, 3:00 PM

## 2014-10-22 NOTE — [facility] Group Notes (Signed)
The focus of this group is to educate the patient on the purpose and policies of crisis stabilization and provide a format to answer questions about their admission.  The group details unit policies and expectations of patients while admitted.  Patient attended 0900 nurse education orientation group this morning.  Patient actively participated, appropriate affect, alert, appropriate insight and engagement.  Today patient will work on 3 goals for discharge.  

## 2014-10-23 DIAGNOSIS — F1420 Cocaine dependence, uncomplicated: Secondary | ICD-10-CM

## 2014-10-23 DIAGNOSIS — Z720 Tobacco use: Secondary | ICD-10-CM

## 2014-10-23 DIAGNOSIS — F1220 Cannabis dependence, uncomplicated: Secondary | ICD-10-CM

## 2014-10-23 MED ORDER — ARIPIPRAZOLE 10 MG PO TABS: 10.0000 mg | ORAL_TABLET | Freq: Every day | ORAL | Status: DC

## 2014-10-23 MED ORDER — ABACAVIR SULFATE-LAMIVUDINE 600-300 MG PO TABS: 1.0000 | ORAL_TABLET | Freq: Every day | ORAL | Status: DC

## 2014-10-23 MED ORDER — RITONAVIR 100 MG PO TABS: 100.0000 mg | ORAL_TABLET | Freq: Every day | ORAL | Status: DC

## 2014-10-23 MED ORDER — DARUNAVIR ETHANOLATE 800 MG PO TABS: 800.0000 mg | ORAL_TABLET | Freq: Every day | ORAL | Status: DC

## 2014-10-23 NOTE — Discharge Summary (Signed)
Physician Discharge Summary Note  Patient:  Blake Chambers is an 24 y.o., male MRN:  [id number] DOB:  1991/04/27 Patient phone:  [phone] (home)  Patient address:   [address] [place] [address],  Total Time spent with patient: 30 minutes  Date of Admission:  10/21/2014 Date of Discharge: 10/23/14  Reason for Admission:  Mood stabilization treatments  Principal Problem: Bipolar I disorder, severe, current or most recent episode depressed, with mixed features Discharge Diagnoses: Patient Active Problem List   Diagnosis Date Noted  . Cocaine use disorder, moderate, dependence [Blake Chambers] 10/23/2014  . Cannabis use disorder, severe, dependence [Blake Chambers] 10/23/2014  . Bipolar I disorder, severe, current or most recent episode depressed, with mixed features [Blake Chambers] 10/21/2014  . Blake Chambers of both hands [Blake Chambers, Blake Chambers] 08/01/2014  . Tobacco use disorder [Blake Chambers] 07/18/2013  . Chronic renal insufficiency, stage II (mild) [Blake Chambers] 11/30/2012  . HIV disease [Blake Chambers] 02/13/2011  . Syphilis [Blake Chambers] 02/13/2011    Musculoskeletal: Strength & Muscle Tone: within normal limits Gait & Station: normal Patient leans: N/A  Psychiatric Specialty Exam: Physical Exam  Psychiatric: He has a normal mood and affect. His speech is normal and behavior is normal. Judgment and thought content normal. Cognition and memory are normal.    Review of Systems  Constitutional: Negative.   HENT: Negative.   Eyes: Negative.   Respiratory: Negative.   Cardiovascular: Negative.   Gastrointestinal: Negative.   Genitourinary: Negative.   Musculoskeletal: Negative.   Skin: Negative.   Neurological: Negative.   Endo/Heme/Allergies: Negative.   Psychiatric/Behavioral: Positive for depression (Stabilized with treatments ) and substance abuse (Positive for cocaine and marijuana on admission ). Negative for suicidal ideas, hallucinations and memory loss. The patient is not nervous/anxious and does not have  insomnia.     Blood pressure 131/88, pulse 94, temperature 97.6 F (36.4 C), temperature source Oral, resp. rate 18, height _0  (1.753 m), weight 68.947 kg (152 lb).Body mass index is 22.44 kg/(m^2).  See Physician SRA     Have you used any form of tobacco in the last 30 days? (Cigarettes, Smokeless Tobacco, Cigars, and/or Pipes): Patient Refused Screening  Has this patient used any form of tobacco in the last 30 days? (Cigarettes, Smokeless Tobacco, Cigars, and/or Pipes) Yes, A prescription for an FDA-approved tobacco cessation medication was offered at discharge and the patient refused  Past Medical History:  Past Medical History  Diagnosis Date  . Anxiety   . Mental health disorder   . Immune deficiency disorder   . HIV (human immunodeficiency virus infection)   . Bipolar affective    History reviewed. No pertinent past surgical history. Family History: History reviewed. No pertinent family history. Social History:  History  Alcohol Use  . 0.6 oz/week  . 1 Standard drinks or equivalent per week    Comment: occasional     History  Drug Use  . 1.00 per week  . Special: Marijuana, Cocaine    Comment: "once every blue moon"    History   Social History  . Marital Status: Single    Spouse Name: N/A  . Number of Children: N/A  . Years of Education: N/A   Social History Main Topics  . Smoking status: Current Every Day Smoker -- 0.30 packs/day    Types: Cigarettes  . Smokeless tobacco: Never Used     Comment: pt. trying to quit  . Alcohol Use: 0.6 oz/week    1 Standard drinks or equivalent per week     Comment: occasional  .  Drug Use: 1.00 per week    Special: Marijuana, Cocaine     Comment: "once every blue moon"  . Sexual Activity:    Partners: Male    Birth Control/ Protection: Condom     Comment: pt. given condoms   Other Topics Concern  . None   Social History Narrative   Risk to Self: Is patient at risk for suicide?: No What has been your use of  drugs/alcohol within the last 12 months?: Just started using crack cocaine this year - states he is done with that, "not messing with it anymore."   Risk to Others:   Prior Inpatient Therapy:   Prior Outpatient Therapy:    Level of Care:  OP  Hospital Course:    Aleric [name] is an 24 y.o. male seeking assistance in the Emergency Department voluntarily for bipolar depression with suicidal ideations. Patient reports that he receives medication management and therapy from [facility]. Patient reports that he has been suicidal lately due to feeling lonely. Patient reports that his siblings moved away and his mother "does not have time for me." Patient reports that he decided to seek help due to [place] in the past with a plan to run out in traffic. Patient reports that he has been using cocaine for the past two years and received treatment at [facility] and was sober for the past six months. Patient reports that he has not been eating, has not slept in the past five days, and went on a cocaine binge for the past five days. Patient reports that he is not sure of how much cocaine he uses, but reports that it is "about $200 worth." Patient reports that his partner is very supportive and they currently live together. Patient reports that he does not have any pending charges, and no upcoming court dates. Patient reports that he was suicidal with a plan to do whatever is convenient, such as running into traffic, overdosing, starving himself, or whatever is close. Patient reports that he has isolated himself for the past five days, he has not slept in the past five days, his apetite is "ok," and he sometimes feels guilty and has lost interest in previously pleasurable activities. Patient reports that he is here to seek help and appears motivated for treatment. Patient denies HI and psychosis. Patient reports that he used to hear voices, but that was "a long, long time ago."         Mare Ludtke was admitted to the adult  unit. He was evaluated and his symptoms were identified. Medication management was discussed and initiated. The patient was started on Abilify 10 mg daily for mood stabilization. He was oriented to the unit and encouraged to participate in unit programming. Medical problems were identified and treated appropriately. 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 He had a previous recommendation to have his distal fingers affected by the Blake Chambers amputated.  His antiviral medications were continued during hospital admission for treatment of chronic HIV infection. Home medication was restarted as needed.        The patient was evaluated each day by a clinical provider to ascertain the patient's response to treatment.  Improvement was noted by the patient's report of decreasing symptoms, improved sleep and appetite, affect, medication tolerance, behavior, and participation in unit programming.  He was asked each day to complete a self inventory noting mood, mental status, pain, new symptoms, anxiety and concerns.  He responded well to medication and being in a therapeutic and supportive environment. Positive and appropriate behavior was noted and the patient was motivated for recovery.  The patient worked closely with the treatment team and case manager to develop a discharge plan with appropriate goals. Coping skills, problem solving as well as relaxation therapies were also part of the unit programming. Patient talked about his relapse on cocaine resulting in his admission to the hospital. He tended to minimize his depressive symptoms and was often focused on discharge. Patient wanted to make amends for the stress that he caused his family and partner.          By the day of discharge he was in much improved condition than upon admission.  Symptoms were reported as significantly decreased or resolved completely. The patient denied SI/HI and  voiced no AVH. He was motivated to continue taking medication with a goal of continued improvement in mental health.  Butler Vegh was discharged home with a plan to follow up as noted below. The patient was provided with sample medications and prescriptions at time of discharge. He left [facility] in stable condition with all belongings returned to him.   Consults:  None  Significant Diagnostic Studies:  Chemistry panel, CBC, UDS positive for cocaine/marijuana   Discharge Vitals:   Blood pressure 131/88, pulse 94, temperature 97.6 F (36.4 C), temperature source Oral, resp. rate 18, height _0  (1.753 m), weight 68.947 kg (152 lb). Body mass index is 22.44 kg/(m^2). Lab Results:   Results for orders placed or performed during the hospital encounter of 10/20/14 (from the past 72 hour(s))  Acetaminophen level     Status: Abnormal   Collection Time: 10/20/14  6:29 PM  Result Value Ref Range   Acetaminophen (Tylenol), Serum <10 (L) 10 - 30 ug/mL    Comment:        THERAPEUTIC CONCENTRATIONS VARY SIGNIFICANTLY. A RANGE OF 10-30 ug/mL MAY BE AN EFFECTIVE CONCENTRATION FOR MANY PATIENTS. HOWEVER, SOME ARE BEST TREATED AT CONCENTRATIONS OUTSIDE THIS RANGE. ACETAMINOPHEN CONCENTRATIONS >150 ug/mL AT 4 HOURS AFTER INGESTION AND >50 ug/mL AT 12 HOURS AFTER INGESTION ARE OFTEN ASSOCIATED WITH TOXIC REACTIONS.   Ethanol (ETOH)     Status: None   Collection Time: 10/20/14  6:29 PM  Result Value Ref Range   Alcohol, Ethyl (B) <5 <5 mg/dL    Comment:        LOWEST DETECTABLE LIMIT FOR SERUM ALCOHOL IS 11 mg/dL FOR MEDICAL PURPOSES ONLY   Salicylate level     Status: None   Collection Time: 10/20/14  6:29 PM  Result Value Ref Range   Salicylate Lvl <9.2 2.8 - 30.0 mg/dL  CBC     Status: None   Collection Time: 10/20/14  6:30 PM  Result Value Ref Range   WBC 7.8 4.0 - 10.5 K/uL   RBC 5.68 4.22 - 5.81 MIL/uL   Hemoglobin 15.2 13.0 - 17.0 g/dL   HCT 44.6 39.0 - 52.0 %   MCV 78.5 78.0 -  100.0 fL   MCH 26.8 26.0 - 34.0 pg   MCHC 34.1 30.0 - 36.0 g/dL   RDW 15.3 11.5 - 15.5 %   Platelets 279 150 - 400 K/uL  Comprehensive metabolic panel     Status: Abnormal   Collection Time: 10/20/14  6:30 PM  Result Value Ref Range   Sodium 139 135 - 145 mmol/L   Potassium 3.6 3.5 - 5.1 mmol/L   Chloride 102 101 - 111 mmol/L  CO2 27 22 - 32 mmol/L   Glucose, Bld 107 (H) 70 - 99 mg/dL   BUN 11 6 - 20 mg/dL   Creatinine, Ser 1.26 (H) 0.61 - 1.24 mg/dL   Calcium 9.3 8.9 - 10.3 mg/dL   Total Protein 8.2 (H) 6.5 - 8.1 g/dL   Albumin 4.2 3.5 - 5.0 g/dL   AST 20 15 - 41 U/L   ALT 17 17 - 63 U/L   Alkaline Phosphatase 67 38 - 126 U/L   Total Bilirubin 0.8 0.3 - 1.2 mg/dL   GFR calc non Af Amer >60 >60 mL/min   GFR calc Af Amer >60 >60 mL/min    Comment: (NOTE) The eGFR has been calculated using the CKD EPI equation. This calculation has not been validated in all clinical situations. eGFR's persistently <60 mL/min signify possible Chronic Kidney Disease.    Anion gap 10 5 - 15  Urine Drug Screen     Status: Abnormal   Collection Time: 10/20/14  7:30 PM  Result Value Ref Range   Opiates NONE DETECTED NONE DETECTED   Cocaine POSITIVE (A) NONE DETECTED   Benzodiazepines NONE DETECTED NONE DETECTED   Amphetamines NONE DETECTED NONE DETECTED   Tetrahydrocannabinol POSITIVE (A) NONE DETECTED   Barbiturates NONE DETECTED NONE DETECTED    Comment:        DRUG SCREEN FOR MEDICAL PURPOSES ONLY.  IF CONFIRMATION IS NEEDED FOR ANY PURPOSE, NOTIFY LAB WITHIN 5 DAYS.        LOWEST DETECTABLE LIMITS FOR URINE DRUG SCREEN Drug Class       Cutoff (ng/mL) Amphetamine      1000 Barbiturate      200 Benzodiazepine   945 Tricyclics       038 Opiates          300 Cocaine          300 THC              50     Physical Findings: AIMS: Facial and Oral Movements Muscles of Facial Expression: None, normal Lips and Perioral Area: None, normal Jaw: None, normal Tongue: None,  normal,Extremity Movements Upper (arms, wrists, hands, fingers): None, normal Lower (legs, knees, ankles, toes): None, normal, Trunk Movements Neck, shoulders, hips: None, normal, Overall Severity Severity of abnormal movements (highest score from questions above): None, normal Incapacitation due to abnormal movements: None, normal Patient's awareness of abnormal movements (rate only patient's report): No Awareness, Dental Status Current problems with teeth and/or dentures?: No Does patient usually wear dentures?: No  CIWA:  CIWA-Ar Total: 1 COWS:  COWS Total Score: 2   See Psychiatric Specialty Exam and Suicide Risk Assessment completed by Attending Physician prior to discharge.  Discharge destination:  Home  Is patient on multiple antipsychotic therapies at discharge:  No   Has Patient had three or more failed trials of antipsychotic monotherapy by history:  No  Recommended Plan for Multiple Antipsychotic Therapies: NA      Discharge Instructions    Discharge instructions    Complete by:  As directed   Please see your Primary Care Provider for continued management of chronic HIV infection. Also please follow up with surgeon as discussed with Dr. Parke Poisson regarding Blake Chambers of fingers.            Medication List    STOP taking these medications        FLUoxetine 20 MG capsule  Commonly known as:  PROZAC      TAKE these  medications      Indication   abacavir-lamiVUDine 600-300 MG per tablet  Commonly known as:  EPZICOM  Take 1 tablet by mouth daily.   Indication:  HIV Disease     ARIPiprazole 10 MG tablet  Commonly known as:  ABILIFY  Take 1 tablet (10 mg total) by mouth daily.   Indication:  Rapidly Alternating Manic-Depressive Psychosis     Darunavir Ethanolate 800 MG tablet  Commonly known as:  PREZISTA  Take 1 tablet (800 mg total) by mouth daily.   Indication:  HIV Disease     ritonavir 100 MG Tabs tablet  Commonly known as:  NORVIR  Take 1 tablet (100  mg total) by mouth daily.   Indication:  HIV Disease       Follow-up Information    Follow up with [facility] On 10/24/2014.   Why:  Please go to [facility]'s walk in clinic in Tuesday, Oct 24, 2014 or any weekday between [place] for medication management.  Please inform receptionist that you are an established patient so that you don't have to wait as long for service.   Contact information:   [address] [place]  [address] Telephone:  [phone]       Follow-up recommendations:   Activity: No restrictions Diet: regular Tests: as needed Other: follow up with after care  Comments:   Take all your medications as prescribed by your mental healthcare provider.  Report any adverse effects and or reactions from your medicines to your outpatient provider promptly.  Patient is instructed and cautioned to not engage in alcohol and or illegal drug use while on prescription medicines.  In the event of worsening symptoms, patient is instructed to call the crisis hotline, 911 and or go to the nearest ED for appropriate evaluation and treatment of symptoms.  Follow-up with your primary care provider for your other medical issues, concerns and or health care needs.   Total Discharge Time: Greater than 30 minutes   Signed: Unika Nazareno NP-C 10/23/2014, 4:45 PM

## 2014-10-23 NOTE — Progress Notes (Signed)
Recreation Therapy Notes  Date: 05.09.2016 Time: 9:30am Location: [address] Dayroom   Group Topic: Stress Management  Goal Area(s) Addresses:  Patient will actively participate in stress management techniques presented during session.   Behavioral Response: Did not attend.   Milli Woolridge L Orianna Biskup, LRT/CTRS  Saheed Carrington L 10/23/2014 3:40 PM 

## 2014-10-23 NOTE — Progress Notes (Signed)
Discharge Note:  Patient stated he received all his belongings, medications, prescriptions, shoes, belt, clothing, toiletries, misc items, etc.  Suicide prevention information given and discussed with patient who stated he understood and had no questions.  Patient stated he [facility].

## 2014-10-23 NOTE — Plan of Care (Signed)
Problem: Diagnosis: Increased Risk For Suicide Attempt Goal: STG-Patient Will Attend All Groups On The Unit Outcome: Progressing Pt attended evening group on 10/22/14 after staff encouraged him to attend.

## 2014-10-23 NOTE — Plan of Care (Signed)
Problem: Consults Goal: Suicide Risk Patient Education (See Patient Education module for education specifics)  Outcome: Completed/Met Date Met:  10/23/14 Nurse discussed suicidal thoughts/coping skills with patient.

## 2014-10-23 NOTE — Progress Notes (Signed)
D: Pt has depressed affect and mood.  Pt reports "I might leave tomorrow."  Pt reports he feels safe to discharge.  When asked what he plans to do after discharge, pt reports "just going to rest, get in touch with my family."  When asked what his goal was, he reports "just trying to do better for my life right now."  Pt denies SI/HI, denies hallucinations, denies pain.  Pt has been visible in milieu.  Pt attended evening group with staff's encouragement.   A: Introduced self to pt.  Met with pt 1:1 and provided support and encouragement.   R: Pt verbally contracts for safety and reports that he will notify staff of needs and concerns.  Will continue to monitor and assess.   

## 2014-10-23 NOTE — [facility] Suicide Risk Assessment (Signed)
[facility]   Demographic Factors:  Male  Total Time spent with patient: 30 minutes  Musculoskeletal: Strength & Muscle Tone: within normal limits Gait & Station: normal Patient leans: N/A  Psychiatric Specialty Exam: Physical Exam  ROS  Blood pressure 131/88, pulse 94, temperature 97.6 F (36.4 C), temperature source Oral, resp. rate 18, height 5\' 9"  (1.753 m), weight 68.947 kg (152 lb).Body mass index is 22.44 kg/(m^2).  General Appearance: Casual  Eye Contact::  Good  Speech:  Clear and Coherent409  Volume:  Normal  Mood:  Euthymic  Affect:  Appropriate  Thought Process:  Coherent  Orientation:  Full (Time, Place, and Person)  Thought Content:  WDL  Suicidal Thoughts:  No  Homicidal Thoughts:  No  Memory:  Immediate;   Fair Recent;   Fair Remote;   Fair  Judgement:  Fair  Insight:  Shallow  Psychomotor Activity:  Normal  Concentration:  Fair  Recall:  FiservFair  Fund of Knowledge:Fair  Language: Fair  Akathisia:  No  Handed:  Right  AIMS (if indicated):     Assets:  Communication Skills Desire for Improvement  Sleep:  Number of Hours: 6  Cognition: WNL  ADL's:  Intact   Have you used any form of tobacco in the last 30 days? (Cigarettes, Smokeless Tobacco, Cigars, and/or Pipes): Patient Refused Screening  Has this patient used any form of tobacco in the last 30 days? (Cigarettes, Smokeless Tobacco, Cigars, and/or Pipes) Yes, A prescription for an FDA-approved tobacco cessation medication was offered at discharge and the patient refused  Mental Status Per Nursing Assessment::   On Admission:  NA  Current Mental Status by Physician: pt denies SI/HI/AH/VH  Loss Factors: Decline in physical health  Historical Factors: Impulsivity  Risk Reduction Factors:   Positive social support and Positive therapeutic relationship  Continued Clinical Symptoms:  Alcohol/Substance Abuse/Dependencies Previous Psychiatric Diagnoses and  Treatments Medical Diagnoses and Treatments/Surgeries  Cognitive Features That Contribute To Risk:  Polarized thinking    Suicide Risk:  Minimal: No identifiable suicidal ideation.  Patients presenting with no risk factors but with morbid ruminations; may be classified as minimal risk based on the severity of the depressive symptoms  Principal Problem: Bipolar affective disorder, current episode depressed Discharge Diagnoses:  Patient Active Problem List   Diagnosis Date Noted  . Cocaine use disorder, moderate, dependence [F14.20] 10/23/2014  . Cannabis use disorder, severe, dependence [F12.20] 10/23/2014  . Bipolar affective disorder, current episode depressed [F31.30] 10/21/2014  . Frostbite of both hands [T33.521A, T33.522A] 08/01/2014  . Tobacco use disorder [Z72.0] 07/18/2013  . 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 HIV disease [B20] 02/13/2011  . Syphilis [A53.9] 02/13/2011    Follow-up Information    Follow up with [facility] On 10/24/2014.   Why:  Please go to [facility]'s walk in clinic in Tuesday, Oct 24, 2014 or any weekday between 8AM - 3PM for medication management.  Please inform receptionist that you are an established patient so that you don't have to wait as long for service.   Contact information:   [address] [place]  [id number] Telephone:  [phone]       Plan Of Care/Follow-up recommendations:  Activity:  No restrictions Diet:  regular Tests:  as needed Other:  follow up with after care  Is patient on multiple antipsychotic therapies at discharge:  No   Has Patient had three or more failed trials of antipsychotic monotherapy by history:  No  Recommended Plan for Multiple Antipsychotic Therapies: NA    Yohan Samons MD 10/23/2014, 10:23 AM

## 2014-10-23 NOTE — [facility] Group Notes (Signed)
[facility] Note   10/23/2014 10:07 AM    Participation Quality:  Appropraite  Mood/Affect:  Appropriate  Depression Rating:  0  Anxiety Rating:  0  Thoughts of Suicide:  No  Will you contract for safety?   NA  Current AVH:  No  Plan for Discharge/Comments:  Patient attended discharge planning group and actively participated in group. He advised of having a home and outpatient services with [facility].  Suicide prevention education reviewed and SPE document provided.   Transportation Means: Patient has transportation.   Supports:  Patient has a support system.   Braylie Badami, Joesph JulyQuylle Hairston

## 2014-10-23 NOTE — Progress Notes (Signed)
  [facility] Dis[facility] :  Will you be returning to the same living situation after discharge:  Yes,  Patient is returning to his home. At discharge, do you have transportation home?: Yes,  Patient is arranging transportation home. Do you have the ability to pay for your medications: Yes,  Patient has Medicaid.  Release of information consent forms completed and in the chart;  Patient's signature needed at discharge.  Patient to Follow up at: Follow-up Information    Follow up with [facility] On 10/24/2014.   Why:  Please go to [facility]'s walk in clinic in Tuesday, Oct 24, 2014 or any weekday between 8AM - 3PM for medication management.  Please inform receptionist that you are an established patient so that you don't have to wait as long for service.   Contact information:   [address] [place]  [id number] Telephone:  [phone]       Patient denies SI/HI: Patient no longer endorsing SI/HI or other thoughts of self harm.     Safety Planning and Suicide Prevention discussed:  .Reviewed with all patients during discharge planning group   Have you used any form of tobacco in the last 30 days? (Cigarettes, Smokeless Tobacco, Cigars, and/or Pipes): Patient Refused Screening  Has patient been referred to the Quitline?: Quitline referral made on 10/23/14.  Wynn BankerHodnett, Garison Genova Hairston 10/23/2014, 9:57 AM

## 2014-10-23 NOTE — Progress Notes (Signed)
D:  Patient's self inventory sheet, patient has good sleep, no sleep medication given.  Good appetite, normal energy level, good concentration.  Denied depression, hopeless, anxiety.  Withdrawals, but did not list any withdrawals.  Denied SI.  Denied physical problems.  Denied pain.  Goal is to do better, plans to be positive.  Does have discharge plans.  No problems anticipated after discharge. A:  Medications administered per MD orders.  Emotional support and encouragement given patient. R:  Denied SI and HI, contracts for safety.  Denied A/V hallucinations.  Safety maintained with 15 minute checks. Patient looking forward to discharge.  Stated he has MD appointment on Wednesday, may have surgery on Wednesday for frostbite.

## 2014-10-26 ENCOUNTER — Other Ambulatory Visit: Payer: Medicaid Other

## 2014-10-26 ENCOUNTER — Telehealth: Payer: Self-pay | Admitting: *Deleted

## 2014-10-26 NOTE — Telephone Encounter (Signed)
Maralyn SagoSarah from the [facility] called [phone] [phone]).  Patient is scheduled for surgery on his frost-bitten hands as referred by Dr. Luciana Axeomer.  She is requesting most recent office note and labs.  Faxed to the office, attention Sarah. Andree CossHowell, Gwendlyn Hanback M, RN

## 2014-10-31 ENCOUNTER — Other Ambulatory Visit: Payer: Self-pay

## 2014-11-14 ENCOUNTER — Encounter: Payer: Self-pay | Admitting: Internal Medicine

## 2014-11-14 ENCOUNTER — Other Ambulatory Visit: Payer: Self-pay | Admitting: Internal Medicine

## 2014-11-14 ENCOUNTER — Ambulatory Visit (INDEPENDENT_AMBULATORY_CARE_PROVIDER_SITE_OTHER): Payer: Medicaid Other | Admitting: Internal Medicine

## 2014-11-14 VITALS — BP 120/79 | HR 69 | Temp 98.0°F | Wt 147.0 lb

## 2014-11-14 DIAGNOSIS — B20 Human immunodeficiency virus [HIV] disease: Secondary | ICD-10-CM | POA: Diagnosis not present

## 2014-11-14 MED ORDER — DARUNAVIR ETHANOLATE 800 MG PO TABS: 800.0000 mg | ORAL_TABLET | Freq: Every day | ORAL | Status: DC

## 2014-11-14 MED ORDER — RITONAVIR 100 MG PO TABS: 100.0000 mg | ORAL_TABLET | Freq: Every day | ORAL | Status: DC

## 2014-11-14 MED ORDER — ABACAVIR SULFATE-LAMIVUDINE 600-300 MG PO TABS: 1.0000 | ORAL_TABLET | Freq: Every day | ORAL | Status: DC

## 2014-11-14 NOTE — Assessment & Plan Note (Addendum)
Will send refills today and labs in about 4 weeks.   Will also have him referred to our home visit nurse to help keep him on track since he has stopped and started multiple times due to his bipolar disease.

## 2014-11-14 NOTE — Progress Notes (Signed)
   Subjective:    Patient ID: Blake Chambers, male    DOB: December 08, 1990, 24 y.o.   MRN: [id number]  HPI He is here for follow up of HIV. He is a history of noncompliance and drug use and recently [facility] with [facility] ideation.  He has not been on his ARVs since he states he could not get refills.  He did see Dr. Mina MarbleWeingold again and having his fingers managed.  He missed his last appt though but is rescheduled.  He continues to go to [facility].     Review of Systems  Constitutional: Negative for fatigue.  Eyes: Negative for visual disturbance.  Gastrointestinal: Negative for diarrhea.  Skin: Negative for rash.  Neurological: Negative for dizziness, light-headedness and headaches.  Psychiatric/Behavioral: Negative for sleep disturbance.       Objective:   Physical Exam  Constitutional: He appears well-developed and well-nourished. No distress.  HENT:  Mouth/Throat: No oropharyngeal exudate.  Eyes: No scleral icterus.  Cardiovascular: Normal rate, regular rhythm and normal heart sounds.   No murmur heard. Pulmonary/Chest: Effort normal and breath sounds normal. No respiratory distress. He has no wheezes.  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  Psychiatric:  Mild mania but redirectable          Assessment & Plan:

## 2014-12-13 ENCOUNTER — Other Ambulatory Visit: Payer: Medicaid Other

## 2014-12-20 ENCOUNTER — Telehealth: Payer: Self-pay | Admitting: *Deleted

## 2014-12-20 NOTE — Telephone Encounter (Signed)
[facility] - will be there for about a week.  Dr. Noel Geroldohen called to advise us of this and to confirm his HIV regimen.  ID will consult as needed during his stay. [phone], Dr Noel Geroldohen if you need to speak with him. Andree CossHowell, Macenzie Burford M, RN

## 2014-12-21 ENCOUNTER — Ambulatory Visit: Payer: Self-pay | Admitting: Internal Medicine

## 2015-01-16 ENCOUNTER — Other Ambulatory Visit: Payer: Medicaid Other

## 2015-01-16 DIAGNOSIS — B20 Human immunodeficiency virus [HIV] disease: Secondary | ICD-10-CM

## 2015-01-16 LAB — COMPLETE METABOLIC PANEL WITH GFR
ALBUMIN: 3.6 g/dL (ref 3.6–5.1)
ALK PHOS: 63 U/L (ref 40–115)
ALT: 19 U/L (ref 9–46)
AST: 21 U/L (ref 10–40)
BILIRUBIN TOTAL: 0.3 mg/dL (ref 0.2–1.2)
BUN: 18 mg/dL (ref 7–25)
CO2: 25 mmol/L (ref 20–31)
Calcium: 8.9 mg/dL (ref 8.6–10.3)
Chloride: 104 mmol/L (ref 98–110)
Creat: 1.18 mg/dL (ref 0.60–1.35)
GFR, Est African American: >89 mL/min (ref >=60)
GFR, Est Non African American: 85 mL/min (ref >=60)
GLUCOSE: 94 mg/dL (ref 65–99)
Potassium: 3.8 mmol/L (ref 3.5–5.3)
SODIUM: 137 mmol/L (ref 135–146)
TOTAL PROTEIN: 7 g/dL (ref 6.1–8.1)

## 2015-01-16 LAB — CBC WITH DIFFERENTIAL/PLATELET
Basophils Absolute: 0.1 10*3/uL (ref 0.0–0.1)
Basophils Relative: 1 % (ref 0–1)
Eosinophils Absolute: 0.1 10*3/uL (ref 0.0–0.7)
Eosinophils Relative: 1 % (ref 0–5)
HCT: 41.3 % (ref 39.0–52.0)
Hemoglobin: 13.3 g/dL (ref 13.0–17.0)
LYMPHS ABS: 2.8 10*3/uL (ref 0.7–4.0)
LYMPHS PCT: 48 % — AB (ref 12–46)
MCH: 26.1 pg (ref 26.0–34.0)
MCHC: 32.2 g/dL (ref 30.0–36.0)
MCV: 81.1 fL (ref 78.0–100.0)
MPV: 11 fL (ref 8.6–12.4)
Monocytes Absolute: 0.6 10*3/uL (ref 0.1–1.0)
Monocytes Relative: 11 % (ref 3–12)
NEUTROS ABS: 2.3 10*3/uL (ref 1.7–7.7)
NEUTROS PCT: 39 % — AB (ref 43–77)
PLATELETS: 243 10*3/uL (ref 150–400)
RBC: 5.09 MIL/uL (ref 4.22–5.81)
RDW: 16.5 % — ABNORMAL HIGH (ref 11.5–15.5)
WBC: 5.9 10*3/uL (ref 4.0–10.5)

## 2015-01-16 NOTE — Addendum Note (Signed)
Addended by: Lavone Nian D on: 01/16/2015 11:20 AM   Modules accepted: Orders

## 2015-01-17 LAB — T-HELPER CELL (CD4) - (RCID CLINIC ONLY)
CD4 % Helper T Cell: 35 % (ref 33–55)
CD4 T CELL ABS: 910 /uL (ref 400–2700)

## 2015-01-18 LAB — HIV-1 RNA ULTRAQUANT REFLEX TO GENTYP+
HIV 1 RNA QUANT: 21 {copies}/mL (ref <20)
HIV-1 RNA QUANT, LOG: 1.32 {Log} — AB (ref <1.30)

## 2015-02-22 ENCOUNTER — Ambulatory Visit: Payer: Self-pay | Admitting: Internal Medicine

## 2015-03-09 ENCOUNTER — Emergency Department (HOSPITAL_COMMUNITY): Payer: Medicaid Other

## 2015-03-09 ENCOUNTER — Emergency Department (HOSPITAL_COMMUNITY)
Admission: EM | Admit: 2015-03-09 | Discharge: 2015-03-09 | Disposition: A | Discharge status: Home / Self Care | Payer: Medicaid Other | Attending: Emergency Medicine | Admitting: Emergency Medicine

## 2015-03-09 ENCOUNTER — Encounter (HOSPITAL_COMMUNITY): Payer: Self-pay | Admitting: Cardiology

## 2015-03-09 DIAGNOSIS — F319 Bipolar disorder, unspecified: Secondary | ICD-10-CM | POA: Insufficient documentation

## 2015-03-09 DIAGNOSIS — R110 Nausea: Secondary | ICD-10-CM | POA: Insufficient documentation

## 2015-03-09 DIAGNOSIS — Z720 Tobacco use: Secondary | ICD-10-CM | POA: Diagnosis not present

## 2015-03-09 DIAGNOSIS — Z21 Asymptomatic human immunodeficiency virus [HIV] infection status: Secondary | ICD-10-CM | POA: Insufficient documentation

## 2015-03-09 DIAGNOSIS — Z79899 Other long term (current) drug therapy: Secondary | ICD-10-CM | POA: Insufficient documentation

## 2015-03-09 DIAGNOSIS — R42 Dizziness and giddiness: Secondary | ICD-10-CM | POA: Diagnosis present

## 2015-03-09 DIAGNOSIS — F419 Anxiety disorder, unspecified: Secondary | ICD-10-CM | POA: Diagnosis not present

## 2015-03-09 DIAGNOSIS — R51 Headache: Secondary | ICD-10-CM | POA: Insufficient documentation

## 2015-03-09 DIAGNOSIS — R519 Headache, unspecified: Secondary | ICD-10-CM

## 2015-03-09 LAB — CBC
HCT: 39.8 % (ref 39.0–52.0)
Hemoglobin: 13.3 g/dL (ref 13.0–17.0)
MCH: 27 pg (ref 26.0–34.0)
MCHC: 33.4 g/dL (ref 30.0–36.0)
MCV: 80.9 fL (ref 78.0–100.0)
PLATELETS: 189 10*3/uL (ref 150–400)
RBC: 4.92 MIL/uL (ref 4.22–5.81)
RDW: 16.6 % — ABNORMAL HIGH (ref 11.5–15.5)
WBC: 6.5 10*3/uL (ref 4.0–10.5)

## 2015-03-09 LAB — BASIC METABOLIC PANEL
Anion gap: 6 (ref 5–15)
BUN: 12 mg/dL (ref 6–20)
CHLORIDE: 107 mmol/L (ref 101–111)
CO2: 26 mmol/L (ref 22–32)
CREATININE: 1.27 mg/dL — AB (ref 0.61–1.24)
Calcium: 9.2 mg/dL (ref 8.9–10.3)
Glucose, Bld: 79 mg/dL (ref 65–99)
POTASSIUM: 3.7 mmol/L (ref 3.5–5.1)
SODIUM: 139 mmol/L (ref 135–145)

## 2015-03-09 LAB — I-STAT TROPONIN, ED: Troponin i, poc: 0 ng/mL (ref 0.00–0.08)

## 2015-03-09 MED ORDER — SODIUM CHLORIDE 0.9 % IV BOLUS (SEPSIS)
1000.0000 mL | Freq: Once | INTRAVENOUS | Status: AC
  Administered 2015-03-09: 1000 mL via INTRAVENOUS

## 2015-03-09 MED ORDER — DIPHENHYDRAMINE HCL 50 MG/ML IJ SOLN
25.0000 mg | Freq: Once | INTRAMUSCULAR | Status: DC
  Filled 2015-03-09: qty 1

## 2015-03-09 MED ORDER — METOCLOPRAMIDE HCL 5 MG/ML IJ SOLN
10.0000 mg | Freq: Once | INTRAMUSCULAR | Status: DC
  Filled 2015-03-09: qty 2

## 2015-03-09 NOTE — Discharge Instructions (Signed)
Stimulant Use Disorder-Cocaine °Cocaine is one of a group of powerful drugs called stimulants. Cocaine has medical uses for stopping nosebleeds and for pain control before minor nose or dental surgery. However, cocaine is misused because of the effects that it produces. These effects include:  °· A feeling of extreme pleasure. °· Alertness. °· High energy. °Common street names for cocaine include coke, crack, blow, snow, and nose candy. Cocaine is snorted, dissolved in water and injected, or smoked.  °Stimulants are addictive because they activate regions of the brain that produce both the pleasurable sensation of "reward" and psychological dependence. Together, these actions account for loss of control and the rapid development of drug dependence. This means you become ill without the drug (withdrawal) and need to keep using it to function.  °Stimulant use disorder is use of stimulants that disrupts your daily life. It disrupts relationships with family and friends and how you do your job. Cocaine increases your blood pressure and heart rate. It can cause a heart attack or stroke. Cocaine can also cause death from irregular heart rate or seizures. °SYMPTOMS °Symptoms of stimulant use disorder with cocaine include: °· Use of cocaine in larger amounts or over a longer period of time than intended. °· Unsuccessful attempts to cut down or control cocaine use. °· A lot of time spent obtaining, using, or recovering from the effects of cocaine. °· A strong desire or urge to use cocaine (craving). °· Continued use of cocaine in spite of major problems at work, school, or home because of use. °· Continued use of cocaine in spite of relationship problems because of use. °· Giving up or cutting down on important life activities because of cocaine use. °· Use of cocaine over and over in situations when it is physically hazardous, such as driving a car. °· Continued use of cocaine in spite of a physical problem that is likely  related to use. Physical problems can include: °¨ Malnutrition. °¨ Nosebleeds. °¨ Chest pain. °¨ High blood pressure. °¨ A hole that develops between the part of your nose that separates your nostrils (perforated nasal septum). °¨ Lung and kidney damage. °· Continued use of cocaine in spite of a mental problem that is likely related to use. Mental problems can include: °¨ Schizophrenia-like symptoms. °¨ Depression. °¨ Bipolar mood swings. °¨ Anxiety. °¨ Sleep problems. °· Need to use more and more cocaine to get the same effect, or lessened effect over time with use of the same amount of cocaine (tolerance). °· Having withdrawal symptoms when cocaine use is stopped, or using cocaine to reduce or avoid withdrawal symptoms. Withdrawal symptoms include: °¨ Depressed or irritable mood. °¨ Low energy or restlessness. °¨ Bad dreams. °¨ Poor or excessive sleep. °¨ Increased appetite. °DIAGNOSIS °Stimulant use disorder is diagnosed by your health care provider. You may be asked questions about your cocaine use and how it affects your life. A physical exam may be done. A drug screen may be ordered. You may be referred to a mental health professional. The diagnosis of stimulant use disorder requires at least two symptoms within 12 months. The type of stimulant use disorder depends on the number of signs and symptoms you have. The type may be: °· Mild. Two or three signs and symptoms. °· Moderate. Four or five signs and symptoms. °· Severe. Six or more signs and symptoms. °TREATMENT °Treatment for stimulant use disorder is usually provided by mental health professionals with training in substance use disorders. The following options are available: °·   Counseling or talk therapy. Talk therapy addresses the reasons you use cocaine and ways to keep you from using again. Goals of talk therapy include:  Identifying and avoiding triggers for use.  Handling cravings.  Replacing use with healthy activities.  Support groups.  Support groups provide emotional support, advice, and guidance.  Medicine. Certain medicines may decrease cocaine cravings or withdrawal symptoms. HOME CARE INSTRUCTIONS  Take medicines only as directed by your health care provider.  Identify the people and activities that trigger your cocaine use and avoid them.  Keep all follow-up visits as directed by your health care provider. SEEK MEDICAL CARE IF:  Your symptoms get worse or you relapse.  You are not able to take medicines as directed. SEEK IMMEDIATE MEDICAL CARE IF:  You have serious thoughts about hurting yourself or others.  You have a seizure, chest pain, sudden weakness, or loss of speech or vision. FOR MORE INFORMATION  National Institute on Drug Abuse: http://www.price-smith.com/  Substance Abuse and Mental Health Services Administration: SkateOasis.com.pt Document Released: 05/30/2000 Document Revised: 10/17/2013 Document Reviewed: 06/15/2013 [facility] Patient Information 2015 [place], [place]. This information is not intended to replace advice given to you by your health care provider. Make sure you discuss any questions you have with your health care provider. General Headache Without Cause A headache is pain or discomfort felt around the head or neck area. The specific cause of a headache may not be found. There are many causes and types of headaches. A few common ones are:  Tension headaches.  Migraine headaches.  Cluster headaches.  Chronic daily headaches. HOME CARE INSTRUCTIONS   Keep all follow-up appointments with your caregiver or any specialist referral.  Only take over-the-counter or prescription medicines for pain or discomfort as directed by your caregiver.  Lie down in a dark, quiet room when you have a headache.  Keep a headache journal to find out what may trigger your migraine headaches. For example, write down:  What you eat and drink.  How much sleep you get.  Any change to your diet or  medicines.  Try massage or other relaxation techniques.  Put ice packs or heat on the head and neck. Use these 3 to 4 times per day for 15 to 20 minutes each time, or as needed.  Limit stress.  Sit up straight, and do not tense your muscles.  Quit smoking if you smoke.  Limit alcohol use.  Decrease the amount of caffeine you drink, or stop drinking caffeine.  Eat and sleep on a regular schedule.  Get 7 to 9 hours of sleep, or as recommended by your caregiver.  Keep lights dim if bright lights bother you and make your headaches worse. SEEK MEDICAL CARE IF:   You have problems with the medicines you were prescribed.  Your medicines are not working.  You have a change from the usual headache.  You have nausea or vomiting. SEEK IMMEDIATE MEDICAL CARE IF:   Your headache becomes severe.  You have a fever.  You have a stiff neck.  You have loss of vision.  You have muscular weakness or loss of muscle control.  You start losing your balance or have trouble walking.  You feel faint or pass out.  You have severe symptoms that are different from your first symptoms. MAKE SURE YOU:   Understand these instructions.  Will watch your condition.  Will get help right away if you are not doing well or get worse. Document Released: 06/02/2005 Document Revised: 08/25/2011  Document Reviewed: 06/18/2011 [facility] Patient Information 2015 [place], [place]. This information is not intended to replace advice given to you by your health care provider. Make sure you discuss any questions you have with your health care provider.

## 2015-03-09 NOTE — ED Notes (Signed)
Pt is in stable condition upon d/c and is escorted from ED via wheelchair. 

## 2015-03-09 NOTE — ED Notes (Addendum)
Pt reports dizziness over the past couple of weeks. Also having episodes of nausea. States he has not taken his HIV medications for the past 3 weeks. State he has been out.  Reports he used Cocaine last night after relapsing from the past 90 days.

## 2015-03-09 NOTE — ED Provider Notes (Signed)
CSN: [id number]     Arrival date & time 03/09/15  1218 History   First MD Initiated Contact with Patient 03/09/15 1312     Chief Complaint  Patient presents with  . Dizziness  . Nausea     (Consider location/radiation/quality/duration/timing/severity/associated sxs/prior Treatment) Patient is a 24 y.o. male presenting with headaches.  Headache Pain location:  Generalized Quality:  Dull Radiates to:  Does not radiate Severity currently:  Unable to specify Onset quality:  Gradual Duration:  3 days Timing:  Constant Progression:  Unchanged Chronicity:  Recurrent Similar to prior headaches: yes   Context comment:  Doing cocaine, noncompliant with HIV medicaitons Relieved by:  Nothing Worsened by:  Light and activity Associated symptoms: no abdominal pain, no cough, no fever, no nausea and no vomiting     Past Medical History  Diagnosis Date  . Anxiety   . Mental health disorder   . Immune deficiency disorder   . HIV (human immunodeficiency virus infection)   . Bipolar affective    History reviewed. No pertinent past surgical history. History reviewed. No pertinent family history. Social History  Substance Use Topics  . Smoking status: Current Every Day Smoker -- 0.30 packs/day    Types: Cigarettes  . Smokeless tobacco: Never Used     Comment: pt. trying to quit  . Alcohol Use: 0.6 oz/week    1 Standard drinks or equivalent per week     Comment: occasional    Review of Systems  Constitutional: Negative for fever.  Respiratory: Negative for cough.   Gastrointestinal: Negative for nausea, vomiting and abdominal pain.  Neurological: Positive for headaches.  All other systems reviewed and are negative.     Allergies  Tenofovir disoproxil fumarate  Home Medications   Prior to Admission medications   Medication Sig Start Date End Date Taking? Authorizing Provider  abacavir-lamiVUDine (EPZICOM) 600-300 MG per tablet Take 1 tablet by mouth daily. 11/14/14   Gardiner Barefoot, MD  ARIPiprazole (ABILIFY) 10 MG tablet Take 1 tablet (10 mg total) by mouth daily. 10/23/14   Thermon Leyland, NP  Darunavir Ethanolate (PREZISTA) 800 MG tablet Take 1 tablet (800 mg total) by mouth daily. 11/14/14   Gardiner Barefoot, MD  ritonavir (NORVIR) 100 MG TABS tablet Take 1 tablet (100 mg total) by mouth daily. 11/14/14   Gardiner Barefoot, MD   BP 111/71 mmHg  Pulse 55  Temp(Src) 97.5 F (36.4 C) (Oral)  Resp 17  Ht  (1.753 m)  Wt 146 lb (66.225 kg)  BMI 21.55 kg/m2  SpO2 100% Physical Exam  Constitutional: He is oriented to person, place, and time. He appears well-developed and well-nourished.  HENT:  Head: Normocephalic and atraumatic.  Eyes: Conjunctivae and EOM are normal.  Neck: Normal range of motion. Neck supple.  Cardiovascular: Normal rate, regular rhythm and normal heart sounds.   Pulmonary/Chest: Effort normal and breath sounds normal. No respiratory distress.  Abdominal: He exhibits no distension. There is no tenderness. There is no rebound and no guarding.  Musculoskeletal: Normal range of motion.  Neurological: He is alert and oriented to person, place, and time. He has normal strength and normal reflexes. No cranial nerve deficit or sensory deficit. Coordination normal. GCS eye subscore is 4. GCS verbal subscore is 5. GCS motor subscore is 6.  Pt drowsy, but awakens easily  Skin: Skin is warm and dry.  Vitals reviewed.   ED Course  Procedures (including critical care time) Labs Review Labs Reviewed  BASIC METABOLIC PANEL - Abnormal; Notable for the following:    Creatinine, Ser 1.27 (*)    All other components within normal limits  CBC - Abnormal; Notable for the following:    RDW 16.6 (*)    All other components within normal limits  I-STAT TROPOININ, ED    Imaging Review Dg Chest 2 View  03/09/2015   CLINICAL DATA:  Initial encounter for headaches with nausea and unable to eat.  EXAM: CHEST  2 VIEW  COMPARISON:  07/20/2014.  FINDINGS: The  heart size and mediastinal contours are within normal limits. Both lungs are clear. The visualized skeletal structures are unremarkable.  IMPRESSION: Stable.  No acute findings.   Electronically Signed   By: Kennith Center M.D.   On: 03/09/2015 13:22   Ct Head Wo Contrast  03/09/2015   CLINICAL DATA:  Dizziness and nausea for 2 days.  EXAM: CT HEAD WITHOUT CONTRAST  TECHNIQUE: Contiguous axial images were obtained from the base of the skull through the vertex without intravenous contrast.  COMPARISON:  None.  FINDINGS: The brain appears normal without hemorrhage, infarct, mass lesion, mass effect, midline shift or abnormal extra-axial fluid collection. No hydrocephalus or pneumocephalus. The calvarium is intact. Imaged paranasal sinuses and mastoid air cells are clear.  IMPRESSION: Normal head CT.   Electronically Signed   By: Drusilla Kanner M.D.   On: 03/09/2015 14:34   I have personally reviewed and evaluated these images and lab results as part of my medical decision-making.   EKG Interpretation   Date/Time:  Friday March 09 2015 12:37:11 EDT Ventricular Rate:  74 PR Interval:  142 QRS Duration: 94 QT Interval:  382 QTC Calculation: 424 R Axis:   57 Text Interpretation:  Normal sinus rhythm ST elevation, consider early  repolarization Borderline ECG No significant change since last tracing  Confirmed by Mirian Mo [phone]) on 03/09/2015 2:12:29 PM      MDM   Final diagnoses:  Acute nonintractable headache, unspecified headache type    24 y.o. male with pertinent PMH of HIV (noncompliant), cocaine abuse (used last night), anxiety, bipolar affective do presents with ha and malaise as above in setting of cocaine use.  No fevers, neuro deficits, or other symptoms.  Exam benign.  Wu as above unremarkable.  Pt resting comfortably without medication.  DC home in stable condition.    I have reviewed all laboratory and imaging studies if ordered as above  1. Acute nonintractable  headache, unspecified headache type         Mirian Mo, MD 03/09/15 1642

## 2015-03-09 NOTE — ED Notes (Signed)
Pt states he has been feeling nauseous, dizzy and has had a h/a a week after til now after quitting his HIV meds. When asked why he didn't refill his meds he states, "I don't know, it's hard for me to do it on my own. I keep meaning to fill it, but it keeps slipping my mind". Pt educated on the importance of taking his HIV medications on a consistent basis.

## 2015-03-19 ENCOUNTER — Encounter (HOSPITAL_BASED_OUTPATIENT_CLINIC_OR_DEPARTMENT_OTHER): Payer: Self-pay

## 2015-03-19 ENCOUNTER — Emergency Department (HOSPITAL_BASED_OUTPATIENT_CLINIC_OR_DEPARTMENT_OTHER)
Admission: EM | Admit: 2015-03-19 | Discharge: 2015-03-19 | Disposition: A | Discharge status: Home / Self Care | Payer: Medicaid Other | Attending: Emergency Medicine | Admitting: Emergency Medicine

## 2015-03-19 DIAGNOSIS — Z8639 Personal history of other endocrine, nutritional and metabolic disease: Secondary | ICD-10-CM | POA: Insufficient documentation

## 2015-03-19 DIAGNOSIS — M791 Myalgia, unspecified site: Secondary | ICD-10-CM

## 2015-03-19 DIAGNOSIS — B20 Human immunodeficiency virus [HIV] disease: Secondary | ICD-10-CM | POA: Insufficient documentation

## 2015-03-19 DIAGNOSIS — Z79899 Other long term (current) drug therapy: Secondary | ICD-10-CM | POA: Diagnosis not present

## 2015-03-19 DIAGNOSIS — Z720 Tobacco use: Secondary | ICD-10-CM | POA: Diagnosis not present

## 2015-03-19 DIAGNOSIS — Z8659 Personal history of other mental and behavioral disorders: Secondary | ICD-10-CM | POA: Diagnosis not present

## 2015-03-19 DIAGNOSIS — M79606 Pain in leg, unspecified: Secondary | ICD-10-CM | POA: Diagnosis present

## 2015-03-19 LAB — CBC WITH DIFFERENTIAL/PLATELET
BASOS PCT: 0 %
Basophils Absolute: 0 10*3/uL (ref 0.0–0.1)
EOS ABS: 0.1 10*3/uL (ref 0.0–0.7)
EOS PCT: 1 %
HEMATOCRIT: 40 % (ref 39.0–52.0)
Hemoglobin: 13.6 g/dL (ref 13.0–17.0)
Lymphocytes Relative: 26 %
Lymphs Abs: 2.6 10*3/uL (ref 0.7–4.0)
MCH: 26.9 pg (ref 26.0–34.0)
MCHC: 34 g/dL (ref 30.0–36.0)
MCV: 79.1 fL (ref 78.0–100.0)
MONO ABS: 0.9 10*3/uL (ref 0.1–1.0)
MONOS PCT: 9 %
NEUTROS ABS: 6.5 10*3/uL (ref 1.7–7.7)
Neutrophils Relative %: 64 %
Platelets: 206 10*3/uL (ref 150–400)
RBC: 5.06 MIL/uL (ref 4.22–5.81)
RDW: 15.9 % — ABNORMAL HIGH (ref 11.5–15.5)
WBC: 10.1 10*3/uL (ref 4.0–10.5)

## 2015-03-19 LAB — BASIC METABOLIC PANEL
ANION GAP: 5 (ref 5–15)
BUN: 17 mg/dL (ref 6–20)
CHLORIDE: 103 mmol/L (ref 101–111)
CO2: 29 mmol/L (ref 22–32)
Calcium: 8.7 mg/dL — ABNORMAL LOW (ref 8.9–10.3)
Creatinine, Ser: 1.49 mg/dL — ABNORMAL HIGH (ref 0.61–1.24)
GFR calc Af Amer: >60 mL/min (ref >60)
GLUCOSE: 90 mg/dL (ref 65–99)
POTASSIUM: 3.2 mmol/L — AB (ref 3.5–5.1)
SODIUM: 137 mmol/L (ref 135–145)

## 2015-03-19 LAB — CK: Total CK: 896 U/L — ABNORMAL HIGH (ref 49–397)

## 2015-03-19 MED ORDER — POTASSIUM CHLORIDE CRYS ER 20 MEQ PO TBCR
40.0000 meq | EXTENDED_RELEASE_TABLET | Freq: Once | ORAL | Status: AC
  Administered 2015-03-19: 40 meq via ORAL
  Filled 2015-03-19: qty 2

## 2015-03-19 MED ORDER — AMMONIA AROMATIC IN INHA
0.3000 mL | Freq: Once | RESPIRATORY_TRACT | Status: AC
  Administered 2015-03-19: 0.3 mL via RESPIRATORY_TRACT
  Filled 2015-03-19: qty 10

## 2015-03-19 MED ORDER — SODIUM CHLORIDE 0.9 % IV BOLUS (SEPSIS)
1000.0000 mL | Freq: Once | INTRAVENOUS | Status: AC
  Administered 2015-03-19: 1000 mL via INTRAVENOUS

## 2015-03-19 NOTE — ED Notes (Signed)
EMS reports patient with bilateral lower leg pain x2 days. Patient states he walks a lot and has been walking from [place] to [place] over the past 2 days and was picked up at [employer].

## 2015-03-19 NOTE — ED Notes (Signed)
151 CBG by EMS.
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
## 2015-03-26 ENCOUNTER — Encounter: Payer: Self-pay | Admitting: Infectious Disease

## 2015-03-26 ENCOUNTER — Ambulatory Visit (INDEPENDENT_AMBULATORY_CARE_PROVIDER_SITE_OTHER): Payer: Medicaid Other | Admitting: Infectious Disease

## 2015-03-26 VITALS — BP 116/72 | HR 74 | Temp 98.1°F | Wt 150.0 lb

## 2015-03-26 DIAGNOSIS — B20 Human immunodeficiency virus [HIV] disease: Secondary | ICD-10-CM | POA: Diagnosis not present

## 2015-03-26 DIAGNOSIS — Z23 Encounter for immunization: Secondary | ICD-10-CM | POA: Diagnosis not present

## 2015-03-26 DIAGNOSIS — N189 Chronic kidney disease, unspecified: Secondary | ICD-10-CM

## 2015-03-26 DIAGNOSIS — F314 Bipolar disorder, current episode depressed, severe, without psychotic features: Secondary | ICD-10-CM

## 2015-03-26 DIAGNOSIS — F1420 Cocaine dependence, uncomplicated: Secondary | ICD-10-CM | POA: Diagnosis not present

## 2015-03-26 DIAGNOSIS — N182 Chronic kidney disease, stage 2 (mild): Secondary | ICD-10-CM

## 2015-03-26 MED ORDER — EMTRICITABINE-TENOFOVIR AF 200-25 MG PO TABS: 1.0000 | ORAL_TABLET | Freq: Every day | ORAL | Status: DC

## 2015-03-26 NOTE — Patient Instructions (Signed)
We are changing your regimen to   Prezista Norvir  And  Descovy  We need blood work in 2 weeks on the [facility]

## 2015-03-26 NOTE — Progress Notes (Signed)
Chief complaint: folllouwp for HIV on medications  Subjective:    Patient ID: Blake Chambers, male    DOB: 27-Apr-1991, 24 y.o.   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 Most recent VL is encouraging as is CD4 count.  He had been admitted in July with bipolar disorder and suicidal ideation after he had disclosed his HIV status to his family with whom he spends time living in [place] when he is not in [place].  I discussed possible switch to Descovy from Epzicom and when he saw size of the pill asked to be switched. I think this should be safe since the TAF in Descovy not associated with renal events and not clear to me that he ever had fulminant TNF toxicity but rather acute on chronic kidney disease.  He is in much better spirits presently and getting along with family better now.  Past Medical History  Diagnosis Date  . Anxiety   . Mental health disorder   . Immune deficiency disorder (HCC)   . HIV (human immunodeficiency virus infection) (HCC)   . Bipolar affective (HCC)     No past surgical history on file.  No family history on file.    Social History   Social History  . Marital Status: Single    Spouse Name: N/A  . Number of Children: N/A  . Years of Education: N/A   Social History Main Topics  . Smoking status: Current Every Day Smoker -- 0.30 packs/day    Types: Cigarettes  . Smokeless tobacco: Never Used     Comment: pt. trying to quit  . Alcohol Use: 0.6 oz/week    1 Standard drinks or equivalent per week     Comment: occasional none 12/15/14  . Drug Use: 1.00 per week    Special: Marijuana, Cocaine     Comment: "once every blue moon"  . Sexual Activity:    Partners: Male    Birth Control/ Protection: Condom     Comment: pt. given condoms   Other Topics Concern  . None   Social History  Narrative    Allergies  Allergen Reactions  . Tenofovir Disoproxil Fumarate Other (See Comments)    Intolerance, increase in creatinine     Current outpatient prescriptions:  .  ARIPiprazole (ABILIFY) 10 MG tablet, Take 1 tablet (10 mg total) by mouth daily., Disp: 30 tablet, Rfl: 0 .  Darunavir Ethanolate (PREZISTA) 800 MG tablet, Take 1 tablet (800 mg total) by mouth daily., Disp: 30 tablet, Rfl: 6 .  ritonavir (NORVIR) 100 MG TABS tablet, Take 1 tablet (100 mg total) by mouth daily., Disp: 30 tablet, Rfl: 6 .  emtricitabine-tenofovir AF (DESCOVY) 200-25 MG tablet, Take 1 tablet by mouth daily., Disp: 30 tablet, Rfl: 11    Review of Systems  Constitutional: Negative for fever, chills, diaphoresis, activity change, appetite change, fatigue and unexpected weight change.  HENT: Negative for congestion, rhinorrhea, sinus pressure, sneezing, sore throat and trouble swallowing.   Eyes: Negative for photophobia and visual disturbance.  Respiratory: Negative for cough, chest tightness, shortness of breath, wheezing and stridor.   Cardiovascular: Negative for chest pain, palpitations and leg swelling.  Gastrointestinal: Negative for nausea, vomiting, abdominal pain, diarrhea, constipation, blood in stool, abdominal distention and anal bleeding.  Genitourinary: Negative for dysuria, hematuria, flank pain and difficulty urinating.  Musculoskeletal: Negative for myalgias,  back pain, joint swelling, arthralgias and gait problem.  Skin: Negative for color change, pallor, rash and wound.  Neurological: Negative for dizziness, tremors, weakness and light-headedness.  Hematological: Negative for adenopathy. Does not bruise/bleed easily.  Psychiatric/Behavioral: Negative for behavioral problems, confusion, sleep disturbance, dysphoric mood, decreased concentration and agitation.       Objective:   Physical Exam  Constitutional: He is oriented to person, place, and time. He appears well-developed  and well-nourished.  HENT:  Head: Normocephalic and atraumatic.  Eyes: Conjunctivae and EOM are normal.  Neck: Normal range of motion. Neck supple.  Cardiovascular: Normal rate and regular rhythm.   Pulmonary/Chest: Effort normal. No respiratory distress. He has no wheezes.  Abdominal: Soft. He exhibits no distension.  Musculoskeletal: Normal range of motion. He exhibits no edema or tenderness.  Neurological: He is alert and oriented to person, place, and time.  Skin: Skin is warm and dry. No rash noted. No erythema. No pallor.  Psychiatric: He has a normal mood and affect. He is slowed.    Has smell about him that makes me wonder about his living conditions      Assessment & Plan:   HIV: change to Prezista, Norvir and Descovy and have him recheck BMP in 2 weeks then followup with Dr Luciana Axe in the next 2 months. Would consider going from Prezista, Norvir to Prezcobix to further reduce pill burden though he mat not like the size of the Prezcobix pill  Bipolar disorder with prior Suicidal ideation: on meds and to see Bernette Redbird. No SI or HI and doing much better   CKD with hx of Acute on chronic renal insufficiency with concern re TNF-DF. I think that should be safe to challenge with TAF based TNF  Hx of Drug abuse: need to broach further. I worry that this is significant contributing factor I spent greater than 40 minutes with the patient including greater than 50% of time in face to face counsel of the patient re his HIV, new ARV regimen, Bipolar disorder, prior suicidal ideation and in coordination of their care.

## 2015-03-27 ENCOUNTER — Ambulatory Visit: Payer: Self-pay

## 2015-05-14 ENCOUNTER — Encounter (HOSPITAL_COMMUNITY): Payer: Self-pay | Admitting: *Deleted

## 2015-05-14 ENCOUNTER — Emergency Department (HOSPITAL_COMMUNITY): Payer: Medicaid Other

## 2015-05-14 ENCOUNTER — Emergency Department (HOSPITAL_COMMUNITY)
Admission: EM | Admit: 2015-05-14 | Discharge: 2015-05-14 | Discharge status: Against Medical Advice | Payer: Medicaid Other | Attending: Emergency Medicine | Admitting: Emergency Medicine

## 2015-05-14 DIAGNOSIS — W010XXA Fall on same level from slipping, tripping and stumbling without subsequent striking against object, initial encounter: Secondary | ICD-10-CM | POA: Insufficient documentation

## 2015-05-14 DIAGNOSIS — Z21 Asymptomatic human immunodeficiency virus [HIV] infection status: Secondary | ICD-10-CM | POA: Diagnosis not present

## 2015-05-14 DIAGNOSIS — S3992XA Unspecified injury of lower back, initial encounter: Secondary | ICD-10-CM | POA: Diagnosis present

## 2015-05-14 DIAGNOSIS — Z862 Personal history of diseases of the blood and blood-forming organs and certain disorders involving the immune mechanism: Secondary | ICD-10-CM | POA: Insufficient documentation

## 2015-05-14 DIAGNOSIS — Y999 Unspecified external cause status: Secondary | ICD-10-CM | POA: Insufficient documentation

## 2015-05-14 DIAGNOSIS — Z79899 Other long term (current) drug therapy: Secondary | ICD-10-CM | POA: Diagnosis not present

## 2015-05-14 DIAGNOSIS — Y93E5 Activity, floor mopping and cleaning: Secondary | ICD-10-CM | POA: Diagnosis not present

## 2015-05-14 DIAGNOSIS — Y9289 Other specified places as the place of occurrence of the external cause: Secondary | ICD-10-CM | POA: Insufficient documentation

## 2015-05-14 DIAGNOSIS — F17210 Nicotine dependence, cigarettes, uncomplicated: Secondary | ICD-10-CM | POA: Insufficient documentation

## 2015-05-14 DIAGNOSIS — S39012A Strain of muscle, fascia and tendon of lower back, initial encounter: Secondary | ICD-10-CM

## 2015-05-14 DIAGNOSIS — W19XXXA Unspecified fall, initial encounter: Secondary | ICD-10-CM

## 2015-05-14 DIAGNOSIS — F319 Bipolar disorder, unspecified: Secondary | ICD-10-CM | POA: Diagnosis not present

## 2015-05-14 MED ORDER — OXYCODONE-ACETAMINOPHEN 5-325 MG PO TABS
1.0000 | ORAL_TABLET | Freq: Once | ORAL | Status: AC
  Administered 2015-05-14: 1 via ORAL
  Filled 2015-05-14: qty 1

## 2015-05-14 NOTE — ED Notes (Signed)
Pt asked this RN for directions to the bathroom. RN noticed pt did not return to room. Pt located in lobby waiting for a ride. Pt states "I need to get out of here." RN asked pt if he would return to room to await x-ray results. Pt refused. RN notified NP of situation.

## 2015-05-14 NOTE — ED Provider Notes (Signed)
CSN: [id number]     Arrival date & time 05/14/15  0014 History   First MD Initiated Contact with Patient 05/14/15 0025     Chief Complaint  Patient presents with  . Fall     (Consider location/radiation/quality/duration/timing/severity/associated sxs/prior Treatment) Patient is a 24 y.o. male presenting with fall. The history is provided by the patient. No language interpreter was used.  Fall This is a new problem. The current episode started today.   Naoma Dienererrance Tigert is a 24 y.o. male who arrived to the ED via EMS with Cervical collar after falling when he slipped and fell while mopping a floor. Patient refused spine board for transfer. Patient complaining of lower back pain. Patient denies neck pain. He states that when he slipped he went down on his buttocks. He did not hit his head or neck, he denies LOC, he denies loss of control of bladder or bowels. Patient denies n/v, numbness of tingling of extremities.   Past Medical History  Diagnosis Date  . Anxiety   . Mental health disorder   . Immune deficiency disorder (HCC)   . HIV (human immunodeficiency virus infection) (HCC)   . Bipolar affective (HCC)    History reviewed. No pertinent past surgical history. No family history on file. Social History  Substance Use Topics  . Smoking status: Current Every Day Smoker -- 0.30 packs/day    Types: Cigarettes  . Smokeless tobacco: Never Used     Comment: pt. trying to quit  . Alcohol Use: 0.6 oz/week    1 Standard drinks or equivalent per week     Comment: occasional none 12/15/14    Review of Systems  Musculoskeletal: Positive for back pain.  all other systems negative    Allergies  Tenofovir disoproxil fumarate  Home Medications   Prior to Admission medications   Medication Sig Start Date End Date Taking? Authorizing Provider  ARIPiprazole (ABILIFY) 10 MG tablet Take 1 tablet (10 mg total) by mouth daily. 10/23/14   Thermon LeylandLaura A Davis, NP  Darunavir Ethanolate (PREZISTA) 800 MG  tablet Take 1 tablet (800 mg total) by mouth daily. 11/14/14   Gardiner Barefootobert W Comer, MD  emtricitabine-tenofovir AF (DESCOVY) 200-25 MG tablet Take 1 tablet by mouth daily. 03/26/15   Randall Hissornelius N Van Dam, MD  ritonavir (NORVIR) 100 MG TABS tablet Take 1 tablet (100 mg total) by mouth daily. 11/14/14   Gardiner Barefootobert W Comer, MD   BP 109/82 mmHg  Pulse 87  Temp(Src) 98.1 F (36.7 C) (Oral)  Resp 16  SpO2 97% Physical Exam  Constitutional: He is oriented to person, place, and time. He appears well-developed and well-nourished. No distress.  HENT:  Head: Normocephalic and atraumatic.  Eyes: Conjunctivae and EOM are normal. Pupils are equal, round, and reactive to light.  Neck: Normal range of motion. Neck supple.  No tenderness over cervical spine, full range of motion without difficulty or pain.  Cardiovascular: Normal rate and regular rhythm.   Pulmonary/Chest: Effort normal and breath sounds normal.  Abdominal: Soft. Bowel sounds are normal. There is no tenderness.  Musculoskeletal: Normal range of motion. He exhibits no edema.       Lumbar back: He exhibits tenderness. He exhibits normal range of motion, no deformity, no spasm and normal pulse.       Back:  Neurological: He is alert and oriented to person, place, and time. He has normal strength. No cranial nerve deficit or sensory deficit. Gait normal.  Pedal pulses 2+ bilateral, adequate circulation  Skin:  Skin is warm and dry.  Psychiatric: He has a normal mood and affect. His behavior is normal.  Nursing note and vitals reviewed.   ED Course  Procedures (including critical care time) Discussed with the patient that after x-ray and results I would complete his exam.   Prior to results of x-ray the patient walked out of the exam room and requested a bathroom. Patient did not return to the exam room RN went to look for him and he was leaving. Patient states that his ride was there to pick him up and he had to leave.  Patient ambulatory without  difficulty.  Labs Review Labs Reviewed - No data to display  Imaging Review Dg Lumbar Spine Complete  05/14/2015  CLINICAL DATA:  Status post fall to the ground while walking. Lower back pain. Initial encounter. EXAM: LUMBAR SPINE - COMPLETE 4+ VIEW COMPARISON:  None. FINDINGS: There is no evidence of fracture or subluxation. Vertebral bodies demonstrate normal height and alignment. Intervertebral disc spaces are preserved. The visualized neural foramina are grossly unremarkable in appearance. The visualized bowel gas pattern is unremarkable in appearance; air and stool are noted within the colon. The sacroiliac joints are within normal limits. IMPRESSION: No evidence of fracture or subluxation along the lumbar spine. Electronically Signed   By: Roanna Raider M.D.   On: 05/14/2015 01:51   I have personally reviewed and evaluated these images as part of my medical decision-making.    MDM  24 y.o. male with low back pain s/p fall. Patient left AMA before x-ray results were back and before exam completed.   Final diagnoses:  Fall, initial encounter  Lumbar strain, initial encounter       [facility], NP 05/14/15 1610  Loren Racer, MD 05/15/15 [phone]

## 2015-05-14 NOTE — ED Notes (Signed)
The pt brought in by gems from home.  He fell moping the floor.  C.o lower back pain.  c-collar by ems .  Pt refused spine board

## 2015-05-24 ENCOUNTER — Other Ambulatory Visit: Payer: Self-pay

## 2015-06-07 ENCOUNTER — Ambulatory Visit: Payer: Self-pay | Admitting: Internal Medicine

## 2015-07-04 ENCOUNTER — Other Ambulatory Visit: Payer: Medicaid Other

## 2015-07-04 DIAGNOSIS — B20 Human immunodeficiency virus [HIV] disease: Secondary | ICD-10-CM

## 2015-07-04 LAB — COMPLETE METABOLIC PANEL WITH GFR
ALT: 18 U/L (ref 9–46)
AST: 24 U/L (ref 10–40)
Albumin: 3.9 g/dL (ref 3.6–5.1)
Alkaline Phosphatase: 69 U/L (ref 40–115)
BUN: 18 mg/dL (ref 7–25)
CHLORIDE: 105 mmol/L (ref 98–110)
CO2: 25 mmol/L (ref 20–31)
Calcium: 9.3 mg/dL (ref 8.6–10.3)
Creat: 1.48 mg/dL — ABNORMAL HIGH (ref 0.60–1.35)
GFR, EST AFRICAN AMERICAN: 75 mL/min (ref >=60)
GFR, EST NON AFRICAN AMERICAN: 65 mL/min (ref >=60)
GLUCOSE: 78 mg/dL (ref 65–99)
POTASSIUM: 4.4 mmol/L (ref 3.5–5.3)
SODIUM: 139 mmol/L (ref 135–146)
TOTAL PROTEIN: 7.2 g/dL (ref 6.1–8.1)
Total Bilirubin: 0.4 mg/dL (ref 0.2–1.2)

## 2015-07-04 LAB — CBC WITH DIFFERENTIAL/PLATELET
BASOS ABS: 0 10*3/uL (ref 0.0–0.1)
Basophils Relative: 0 % (ref 0–1)
EOS ABS: 0.2 10*3/uL (ref 0.0–0.7)
Eosinophils Relative: 3 % (ref 0–5)
HCT: 41.9 % (ref 39.0–52.0)
HEMOGLOBIN: 13.8 g/dL (ref 13.0–17.0)
LYMPHS ABS: 2.5 10*3/uL (ref 0.7–4.0)
Lymphocytes Relative: 42 % (ref 12–46)
MCH: 26.9 pg (ref 26.0–34.0)
MCHC: 32.9 g/dL (ref 30.0–36.0)
MCV: 81.7 fL (ref 78.0–100.0)
MONOS PCT: 9 % (ref 3–12)
MPV: 10.5 fL (ref 8.6–12.4)
Monocytes Absolute: 0.5 10*3/uL (ref 0.1–1.0)
NEUTROS ABS: 2.7 10*3/uL (ref 1.7–7.7)
NEUTROS PCT: 46 % (ref 43–77)
Platelets: 225 10*3/uL (ref 150–400)
RBC: 5.13 MIL/uL (ref 4.22–5.81)
RDW: 16.6 % — ABNORMAL HIGH (ref 11.5–15.5)
WBC: 5.9 10*3/uL (ref 4.0–10.5)

## 2015-07-05 LAB — T-HELPER CELL (CD4) - (RCID CLINIC ONLY)
CD4 T CELL ABS: 1090 /uL (ref 400–2700)
CD4 T CELL HELPER: 39 % (ref 33–55)

## 2015-07-06 ENCOUNTER — Telehealth: Payer: Self-pay | Admitting: *Deleted

## 2015-07-06 LAB — HIV-1 RNA QUANT-NO REFLEX-BLD
HIV 1 RNA Quant: 1577 copies/mL — ABNORMAL HIGH (ref <20)
HIV-1 RNA QUANT, LOG: 3.2 {Log_copies}/mL — AB (ref <1.30)

## 2015-07-06 NOTE — Telephone Encounter (Signed)
-----   Message from Gardiner Barefoot, MD sent at 07/06/2015  8:34 AM EST ----- He looks like he is off of his medication.  If he is taking it, please add a genotype.   ? Ambre.  Not sure if someone is helping him already but he needs it. thanks

## 2015-07-06 NOTE — Telephone Encounter (Signed)
Left voice mail to return my call. [name]

## 2015-08-16 ENCOUNTER — Ambulatory Visit: Payer: Self-pay | Admitting: Internal Medicine

## 2015-10-18 ENCOUNTER — Encounter: Payer: Self-pay | Admitting: Internal Medicine

## 2015-10-18 ENCOUNTER — Ambulatory Visit (INDEPENDENT_AMBULATORY_CARE_PROVIDER_SITE_OTHER): Payer: Medicaid Other | Admitting: Internal Medicine

## 2015-10-18 ENCOUNTER — Ambulatory Visit: Payer: Self-pay | Admitting: *Deleted

## 2015-10-18 VITALS — BP 113/78 | HR 77 | Temp 97.7°F | Wt 150.0 lb

## 2015-10-18 DIAGNOSIS — B20 Human immunodeficiency virus [HIV] disease: Secondary | ICD-10-CM | POA: Diagnosis not present

## 2015-10-18 DIAGNOSIS — T33522A Superficial frostbite of left hand, initial encounter: Secondary | ICD-10-CM

## 2015-10-18 DIAGNOSIS — F314 Bipolar disorder, current episode depressed, severe, without psychotic features: Secondary | ICD-10-CM

## 2015-10-18 DIAGNOSIS — T33521A Superficial frostbite of right hand, initial encounter: Secondary | ICD-10-CM | POA: Diagnosis not present

## 2015-10-18 MED ORDER — DARUNAVIR ETHANOLATE 800 MG PO TABS: 800.0000 mg | ORAL_TABLET | Freq: Every day | ORAL | Status: DC

## 2015-10-18 MED ORDER — ARIPIPRAZOLE 10 MG PO TABS: 10.0000 mg | ORAL_TABLET | Freq: Every day | ORAL | Status: DC

## 2015-10-18 MED ORDER — RITONAVIR 100 MG PO TABS: 100.0000 mg | ORAL_TABLET | Freq: Every day | ORAL | Status: DC

## 2015-10-18 MED ORDER — EMTRICITABINE-TENOFOVIR AF 200-25 MG PO TABS: 1.0000 | ORAL_TABLET | Freq: Every day | ORAL | Status: DC

## 2015-10-19 NOTE — Assessment & Plan Note (Signed)
I will refill his abilify and he will continue to see our counselor including today.

## 2015-10-19 NOTE — Assessment & Plan Note (Signed)
Stable now. 

## 2015-10-19 NOTE — Assessment & Plan Note (Signed)
Medication refilled and he is instructed to go get medications now.  rtc 4 weeks and will check labs then.

## 2015-10-19 NOTE — Progress Notes (Signed)
   Subjective:    Patient ID: Blake Chambers, male    DOB: [birth year], 25 y.o.   MRN: [id number]  HPI He is here for follow up of HIV.  He is a history of noncompliance and drug use and [facility] with [facility].  He has not been on his ARVs still but interested in starting now.  Also wants to restart Abilify and see our counselor.  Fingers doing well.  Denies drug use, no weight loss.  Complains of his living situation and trying to save up to get his own place by the end of the summer.      Review of Systems  Constitutional: Negative for fatigue.  Eyes: Negative for visual disturbance.  Gastrointestinal: Negative for diarrhea.  Skin: Negative for rash.  Neurological: Negative for dizziness, light-headedness and headaches.  Psychiatric/Behavioral: Negative for sleep disturbance.       Objective:   Physical Exam  Constitutional: He appears well-developed and well-nourished. No distress.  HENT:  Mouth/Throat: No oropharyngeal exudate.  Eyes: No scleral icterus.  Cardiovascular: Normal rate, regular rhythm and normal heart sounds.   No murmur heard. Pulmonary/Chest: Effort normal and breath sounds normal. No respiratory distress. He has no wheezes.  Musculoskeletal:  Well-healed finger tips s/p amputation.  Skin: No rash noted.  Psychiatric:  Mild mania but redirectable   Social History   Social History  . Marital Status: Single    Spouse Name: N/A  . Number of Children: N/A  . Years of Education: N/A   Occupational History  . Not on file.   Social History Main Topics  . Smoking status: Current Every Day Smoker -- 0.30 packs/day    Types: Cigarettes  . Smokeless tobacco: Never Used     Comment: pt. trying to quit  . Alcohol Use: 0.6 oz/week    1 Standard drinks or equivalent per week     Comment: occasional none 12/15/14  . Drug Use: 1.00 per week    Special: Marijuana, Cocaine     Comment: "once every blue moon"  . Sexual Activity:   Partners: Male    Birth Control/ Protection: Condom     Comment: pt. given condoms   Other Topics Concern  . Not on file   Social History Narrative         Assessment & Plan:

## 2015-10-19 NOTE — BH Specialist Note (Signed)
Counselor met with Blake Chambers in the exam room today upon the request of patient saying he needed to talk to someone.  Patient was oriented times four with good affect and dress.  Patient was alert and talkative.  Patient communicated that he was feeling overwhelmed lately and felt like he did not have balance in his life.   Counselor used reflective listening skills as patient communicated about his life. Patient shared that he was too concentrated on taking care of everyone else and not giving himself some quality time.  Patient stated that he had gotten into a bad place in life where he was not motivated to take his meds and treat his body well.  Counselor educated patient about how stress can cause all kinds of problems. Counselor provided support and encouragement for patient.  Counselor recommended that patient meet for a while in order to get his life back in control and work on himself.  Patient agreed that working on himself and taking his medications should be a priority.  Patient made another appointment for May 8th.  Rolena Infante, MA, LPC [facility]

## 2015-10-22 ENCOUNTER — Ambulatory Visit: Payer: Self-pay | Admitting: *Deleted

## 2015-10-28 ENCOUNTER — Emergency Department (HOSPITAL_COMMUNITY)
Admission: EM | Admit: 2015-10-28 | Discharge: 2015-10-28 | Disposition: A | Discharge status: Home / Self Care | Payer: Medicaid Other | Attending: Emergency Medicine | Admitting: Emergency Medicine

## 2015-10-28 ENCOUNTER — Encounter (HOSPITAL_COMMUNITY): Payer: Self-pay | Admitting: Emergency Medicine

## 2015-10-28 DIAGNOSIS — F17210 Nicotine dependence, cigarettes, uncomplicated: Secondary | ICD-10-CM | POA: Insufficient documentation

## 2015-10-28 DIAGNOSIS — Z79899 Other long term (current) drug therapy: Secondary | ICD-10-CM | POA: Diagnosis not present

## 2015-10-28 DIAGNOSIS — R05 Cough: Secondary | ICD-10-CM | POA: Insufficient documentation

## 2015-10-28 DIAGNOSIS — M79604 Pain in right leg: Secondary | ICD-10-CM | POA: Insufficient documentation

## 2015-10-28 DIAGNOSIS — M79605 Pain in left leg: Secondary | ICD-10-CM | POA: Insufficient documentation

## 2015-10-28 DIAGNOSIS — R252 Cramp and spasm: Secondary | ICD-10-CM | POA: Diagnosis not present

## 2015-10-28 LAB — CBC WITH DIFFERENTIAL/PLATELET
BASOS PCT: 0 %
Basophils Absolute: 0 10*3/uL (ref 0.0–0.1)
Eosinophils Absolute: 0.2 10*3/uL (ref 0.0–0.7)
Eosinophils Relative: 2 %
HEMATOCRIT: 43 % (ref 39.0–52.0)
Hemoglobin: 14.4 g/dL (ref 13.0–17.0)
LYMPHS ABS: 2.3 10*3/uL (ref 0.7–4.0)
Lymphocytes Relative: 28 %
MCH: 26.5 pg (ref 26.0–34.0)
MCHC: 33.5 g/dL (ref 30.0–36.0)
MCV: 79.2 fL (ref 78.0–100.0)
MONOS PCT: 7 %
Monocytes Absolute: 0.5 10*3/uL (ref 0.1–1.0)
NEUTROS ABS: 5.2 10*3/uL (ref 1.7–7.7)
NEUTROS PCT: 63 %
Platelets: 256 10*3/uL (ref 150–400)
RBC: 5.43 MIL/uL (ref 4.22–5.81)
RDW: 15.6 % — ABNORMAL HIGH (ref 11.5–15.5)
WBC: 8.2 10*3/uL (ref 4.0–10.5)

## 2015-10-28 LAB — BASIC METABOLIC PANEL
ANION GAP: 8 (ref 5–15)
BUN: 17 mg/dL (ref 6–20)
CHLORIDE: 105 mmol/L (ref 101–111)
CO2: 24 mmol/L (ref 22–32)
Calcium: 8.9 mg/dL (ref 8.9–10.3)
Creatinine, Ser: 1.34 mg/dL — ABNORMAL HIGH (ref 0.61–1.24)
GFR calc Af Amer: >60 mL/min (ref >60)
Glucose, Bld: 116 mg/dL — ABNORMAL HIGH (ref 65–99)
POTASSIUM: 5 mmol/L (ref 3.5–5.1)
SODIUM: 137 mmol/L (ref 135–145)

## 2015-10-28 LAB — CK: Total CK: 613 U/L — ABNORMAL HIGH (ref 49–397)

## 2015-10-28 MED ORDER — SODIUM CHLORIDE 0.9 % IV BOLUS (SEPSIS)
1000.0000 mL | Freq: Once | INTRAVENOUS | Status: AC
Start: 2015-10-28 — End: 2015-10-28
  Administered 2015-10-28: 1000 mL via INTRAVENOUS

## 2015-10-28 MED ORDER — SODIUM CHLORIDE 0.9 % IV BOLUS (SEPSIS)
1000.0000 mL | Freq: Once | INTRAVENOUS | Status: AC
  Administered 2015-10-28: 1000 mL via INTRAVENOUS

## 2015-10-28 NOTE — ED Notes (Signed)
Patient presents via EMS for muscle cramps r/t walking, patient is homeless and c/o leg pain x3 days. C/o nausea and dehydration. Patient ambulatory in triage.   Last VS: 104/82, 80hr, 16resp

## 2015-10-28 NOTE — ED Provider Notes (Signed)
CSN: [id number]     Arrival date & time 10/28/15  0140 History   First MD Initiated Contact with Patient 10/28/15 0455     Chief Complaint  Patient presents with  . Headache  . Leg Pain     (Consider location/radiation/quality/duration/timing/severity/associated sxs/prior Treatment) HPI Blake Chambers is a 25 y.o. male with history of mental disorder, HIV, bipolar disease, anxiety, presents to emergency department complaining of bilateral leg cramping, headache, anorexia. Patient states he has any in 2 days because he has had no appetite. He states that he got kicked out of his roommates house and has been outside walking because he couldn't get into a shelter. He states he has been walking for 3 days and developed cramps in bilateral calves. He states they feel like charley horse. States cramps, and go. He hasn't been able to take any medications for this. He states he has history of the same. He states he feels like he is dehydrated. He also states he couldn't sleep last night. Denies any other complaints. No fever or chills. No injuries.  Past Medical History  Diagnosis Date  . Anxiety   . Mental health disorder   . Immune deficiency disorder (HCC)   . HIV (human immunodeficiency virus infection) (HCC)   . Bipolar affective (HCC)    History reviewed. No pertinent past surgical history. No family history on file. Social History  Substance Use Topics  . Smoking status: Current Every Day Smoker -- 0.30 packs/day    Types: Cigarettes  . Smokeless tobacco: Never Used     Comment: pt. trying to quit  . Alcohol Use: 0.6 oz/week    1 Standard drinks or equivalent per week     Comment: occasional none 12/15/14    Review of Systems  Constitutional: Positive for appetite change. Negative for fever and chills.  Respiratory: Negative for cough, chest tightness and shortness of breath.   Cardiovascular: Negative for chest pain, palpitations and leg swelling.  Gastrointestinal: Negative for  nausea, vomiting, abdominal pain, diarrhea and abdominal distention.  Musculoskeletal: Positive for myalgias and arthralgias. Negative for neck pain and neck stiffness.  Skin: Negative for rash.  Allergic/Immunologic: Negative for immunocompromised state.  Neurological: Positive for headaches. Negative for dizziness, weakness, light-headedness and numbness.  Psychiatric/Behavioral: Positive for sleep disturbance.  All other systems reviewed and are negative.     Allergies  Tenofovir disoproxil fumarate  Home Medications   Prior to Admission medications   Medication Sig Start Date End Date Taking? Authorizing Provider  ARIPiprazole (ABILIFY) 10 MG tablet Take 1 tablet (10 mg total) by mouth daily. 10/18/15   Gardiner Barefoot, MD  Darunavir Ethanolate (PREZISTA) 800 MG tablet Take 1 tablet (800 mg total) by mouth daily. 10/18/15   Gardiner Barefoot, MD  emtricitabine-tenofovir AF (DESCOVY) 200-25 MG tablet Take 1 tablet by mouth daily. 10/18/15   Gardiner Barefoot, MD  ritonavir (NORVIR) 100 MG TABS tablet Take 1 tablet (100 mg total) by mouth daily. 10/18/15   Gardiner Barefoot, MD   BP 107/69 mmHg  Pulse 79  Temp(Src) 97.7 F (36.5 C) (Oral)  Resp 18  SpO2 97% Physical Exam  Constitutional: He is oriented to person, place, and time. He appears well-developed and well-nourished. No distress.  HENT:  Head: Normocephalic and atraumatic.  Eyes: Conjunctivae are normal.  Neck: Neck supple.  Cardiovascular: Normal rate, regular rhythm and normal heart sounds.   Pulmonary/Chest: Effort normal and breath sounds normal. No respiratory distress. He has no wheezes.  He has no rales.  Abdominal: Soft. Bowel sounds are normal. He exhibits no distension. There is no tenderness. There is no rebound.  Musculoskeletal: He exhibits no edema.  Calf tenderness bilaterally. No obvious swelling, bruising, deformity. DP pulses intact and equal. Full rom at knee and ankle joints bilaterally  Neurological: He is alert  and oriented to person, place, and time.  Skin: Skin is warm and dry.  Nursing note and vitals reviewed.   ED Course  Procedures (including critical care time) Labs Review Labs Reviewed  CBC WITH DIFFERENTIAL/PLATELET - Abnormal; Notable for the following:    RDW 15.6 (*)    All other components within normal limits  CK - Abnormal; Notable for the following:    Total CK 613 (*)    All other components within normal limits  BASIC METABOLIC PANEL - Abnormal; Notable for the following:    Glucose, Bld 116 (*)    Creatinine, Ser 1.34 (*)    All other components within normal limits    Imaging Review No results found. I have personally reviewed and evaluated these images and lab results as part of my medical decision-making.   EKG Interpretation None      MDM   Final diagnoses:  Muscle cramps   Patient emergency department with muscle cramps, dehydration, headache. Vital signs are normal. No obvious swelling or exam abnormality noted in his legs. Will get labs, CK level, start IV fluids.  6:30 AM Checked on pt. Was going to treat with a muscle relaxant, but he is asleep. Hard to arouse but he is able to wake up and converse. Labs pending. Will continue to monitor.   pts CK is 613, pt has hx of elevated CK, he also has hx of cocaine use. At this time, his VS are normal. There are no concerning findings on exam. He was hydrated with 2L of NS. Stable for dc home.   Filed Vitals:   10/28/15 0230 10/28/15 0643 10/28/15 0834  BP: 107/69 109/68 118/76  Pulse: 79 73 81  Temp: 97.7 F (36.5 C)    TempSrc: Oral    Resp: 18 16 16   SpO2: 97% 98% 99%     Jaynie Crumbleatyana Chester Romero, PA-C 10/28/15 2136  Devoria AlbeIva Knapp, MD 11/12/15 1902

## 2015-10-28 NOTE — Discharge Instructions (Signed)
Make sure to drink plenty of fluids. Follow up as needed.    Muscle Cramps and Spasms Muscle cramps and spasms occur when a muscle or muscles tighten and you have no control over this tightening (involuntary muscle contraction). They are a common problem and can develop in any muscle. The most common place is in the calf muscles of the leg. Both muscle cramps and muscle spasms are involuntary muscle contractions, but they also have differences:   Muscle cramps are sporadic and painful. They may last a few seconds to a quarter of an hour. Muscle cramps are often more forceful and last longer than muscle spasms.  Muscle spasms may or may not be painful. They may also last just a few seconds or much longer. CAUSES  It is uncommon for cramps or spasms to be due to a serious underlying problem. In many cases, the cause of cramps or spasms is unknown. Some common causes are:   Overexertion.   Overuse from repetitive motions (doing the same thing over and over).   Remaining in a certain position for a long period of time.   Improper preparation, form, or technique while performing a sport or activity.   Dehydration.   Injury.   Side effects of some medicines.   Abnormally low levels of the salts and ions in your blood (electrolytes), especially potassium and calcium. This could happen if you are taking water pills (diuretics) or you are pregnant.  Some underlying medical problems can make it more likely to develop cramps or spasms. These include, but are not limited to:   Diabetes.   Parkinson disease.   Hormone disorders, such as thyroid problems.   Alcohol abuse.   Diseases specific to muscles, joints, and bones.   Blood vessel disease where not enough blood is getting to the muscles.  HOME CARE INSTRUCTIONS   Stay well hydrated. Drink enough water and fluids to keep your urine clear or pale yellow.  It may be helpful to massage, stretch, and relax the affected  muscle.  For tight or tense muscles, use a warm towel, heating pad, or hot shower water directed to the affected area.  If you are sore or have pain after a cramp or spasm, applying ice to the affected area may relieve discomfort.  Put ice in a plastic bag.  Place a towel between your skin and the bag.  Leave the ice on for 15-20 minutes, 03-04 times a day.  Medicines used to treat a known cause of cramps or spasms may help reduce their frequency or severity. Only take over-the-counter or prescription medicines as directed by your caregiver. SEEK MEDICAL CARE IF:  Your cramps or spasms get more severe, more frequent, or do not improve over time.  MAKE SURE YOU:   Understand these instructions.  Will watch your condition.  Will get help right away if you are not doing well or get worse.   This information is not intended to replace advice given to you by your health care provider. Make sure you discuss any questions you have with your health care provider.   Document Released: 11/22/2001 Document Revised: 09/27/2012 Document Reviewed: 05/19/2012 Elsevier Interactive Patient Education [employer]2016 [employer]

## 2015-10-28 NOTE — ED Notes (Signed)
Patient c/o HA, bilateral muscle cramping in legs and dehydration. Reports decreased PO intake, normal UO, last BM yesterday and was normal for patient. Patient is falling asleep during triage.

## 2015-10-28 NOTE — ED Notes (Signed)
Delay in lab draws.  Nurse stated that she will draw blood when she starts the pt's IV.

## 2015-12-12 ENCOUNTER — Ambulatory Visit: Payer: Self-pay | Admitting: Internal Medicine

## 2016-01-28 ENCOUNTER — Encounter (HOSPITAL_COMMUNITY): Payer: Self-pay | Admitting: *Deleted

## 2016-01-28 ENCOUNTER — Emergency Department (HOSPITAL_COMMUNITY)
Admission: EM | Admit: 2016-01-28 | Discharge: 2016-01-28 | Disposition: A | Discharge status: Home / Self Care | Payer: Medicaid Other | Attending: Emergency Medicine | Admitting: Emergency Medicine

## 2016-01-28 DIAGNOSIS — F17210 Nicotine dependence, cigarettes, uncomplicated: Secondary | ICD-10-CM | POA: Insufficient documentation

## 2016-01-28 DIAGNOSIS — Y999 Unspecified external cause status: Secondary | ICD-10-CM | POA: Diagnosis not present

## 2016-01-28 DIAGNOSIS — Y929 Unspecified place or not applicable: Secondary | ICD-10-CM | POA: Diagnosis not present

## 2016-01-28 DIAGNOSIS — Z23 Encounter for immunization: Secondary | ICD-10-CM | POA: Diagnosis not present

## 2016-01-28 DIAGNOSIS — Z21 Asymptomatic human immunodeficiency virus [HIV] infection status: Secondary | ICD-10-CM | POA: Diagnosis not present

## 2016-01-28 DIAGNOSIS — S40811A Abrasion of right upper arm, initial encounter: Secondary | ICD-10-CM

## 2016-01-28 DIAGNOSIS — S50811A Abrasion of right forearm, initial encounter: Secondary | ICD-10-CM | POA: Diagnosis not present

## 2016-01-28 DIAGNOSIS — W268XXA Contact with other sharp object(s), not elsewhere classified, initial encounter: Secondary | ICD-10-CM | POA: Diagnosis not present

## 2016-01-28 DIAGNOSIS — S59911A Unspecified injury of right forearm, initial encounter: Secondary | ICD-10-CM | POA: Diagnosis present

## 2016-01-28 DIAGNOSIS — Y939 Activity, unspecified: Secondary | ICD-10-CM | POA: Insufficient documentation

## 2016-01-28 DIAGNOSIS — K921 Melena: Secondary | ICD-10-CM | POA: Diagnosis not present

## 2016-01-28 LAB — CBC WITH DIFFERENTIAL/PLATELET
BASOS PCT: 0 %
Basophils Absolute: 0 10*3/uL (ref 0.0–0.1)
EOS ABS: 0.2 10*3/uL (ref 0.0–0.7)
EOS PCT: 4 %
HCT: 40.1 % (ref 39.0–52.0)
HEMOGLOBIN: 13 g/dL (ref 13.0–17.0)
LYMPHS ABS: 2.7 10*3/uL (ref 0.7–4.0)
Lymphocytes Relative: 50 %
MCH: 26.6 pg (ref 26.0–34.0)
MCHC: 32.4 g/dL (ref 30.0–36.0)
MCV: 82 fL (ref 78.0–100.0)
MONO ABS: 0.4 10*3/uL (ref 0.1–1.0)
MONOS PCT: 8 %
Neutro Abs: 2 10*3/uL (ref 1.7–7.7)
Neutrophils Relative %: 38 %
PLATELETS: 179 10*3/uL (ref 150–400)
RBC: 4.89 MIL/uL (ref 4.22–5.81)
RDW: 16.3 % — ABNORMAL HIGH (ref 11.5–15.5)
WBC: 5.3 10*3/uL (ref 4.0–10.5)

## 2016-01-28 LAB — COMPREHENSIVE METABOLIC PANEL
ALBUMIN: 3.4 g/dL — AB (ref 3.5–5.0)
ALK PHOS: 79 U/L (ref 38–126)
ALT: 28 U/L (ref 17–63)
ANION GAP: 5 (ref 5–15)
AST: 35 U/L (ref 15–41)
BUN: 11 mg/dL (ref 6–20)
CHLORIDE: 110 mmol/L (ref 101–111)
CO2: 24 mmol/L (ref 22–32)
Calcium: 8.6 mg/dL — ABNORMAL LOW (ref 8.9–10.3)
Creatinine, Ser: 1.15 mg/dL (ref 0.61–1.24)
GFR calc non Af Amer: >60 mL/min (ref >60)
GLUCOSE: 129 mg/dL — AB (ref 65–99)
Potassium: 3.5 mmol/L (ref 3.5–5.1)
SODIUM: 139 mmol/L (ref 135–145)
Total Bilirubin: 0.3 mg/dL (ref 0.3–1.2)
Total Protein: 6.5 g/dL (ref 6.5–8.1)

## 2016-01-28 LAB — POC OCCULT BLOOD, ED: Fecal Occult Bld: NEGATIVE

## 2016-01-28 MED ORDER — BACITRACIN ZINC 500 UNIT/GM EX OINT: 1.0000 "application " | TOPICAL_OINTMENT | Freq: Two times a day (BID) | CUTANEOUS | 0 refills | Status: DC

## 2016-01-28 MED ORDER — TETANUS-DIPHTH-ACELL PERTUSSIS 5-2.5-18.5 LF-MCG/0.5 IM SUSP
0.5000 mL | Freq: Once | INTRAMUSCULAR | Status: AC
  Administered 2016-01-28: 0.5 mL via INTRAMUSCULAR
  Filled 2016-01-28: qty 0.5
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
## 2016-01-28 NOTE — ED Notes (Signed)
MD at bedside. 

## 2016-01-28 NOTE — Discharge Instructions (Signed)
Take tylenol, motrin for pain.   Apply bacitracin to right arm twice daily to prevent infection.  Please take your HIV meds.   See your doctor  Return to ER if you have fever, purulent discharge from the wound, rectal bleeding, abdominal pain
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
## 2016-02-05 ENCOUNTER — Emergency Department (HOSPITAL_COMMUNITY)
Admission: EM | Admit: 2016-02-05 | Discharge: 2016-02-05 | Discharge status: Against Medical Advice | Payer: Medicaid Other

## 2016-03-24 ENCOUNTER — Other Ambulatory Visit: Payer: Medicaid Other

## 2016-03-24 ENCOUNTER — Other Ambulatory Visit (HOSPITAL_COMMUNITY)
Admission: RE | Admit: 2016-03-24 | Discharge: 2016-03-24 | Disposition: A | Discharge status: Home / Self Care | Payer: Medicaid Other | Source: Ambulatory Visit | Attending: Internal Medicine | Admitting: Internal Medicine

## 2016-03-24 DIAGNOSIS — Z79899 Other long term (current) drug therapy: Secondary | ICD-10-CM

## 2016-03-24 DIAGNOSIS — Z113 Encounter for screening for infections with a predominantly sexual mode of transmission: Secondary | ICD-10-CM

## 2016-03-24 DIAGNOSIS — B20 Human immunodeficiency virus [HIV] disease: Secondary | ICD-10-CM

## 2016-03-24 LAB — BASIC METABOLIC PANEL WITH GFR
BUN: 10 mg/dL (ref 7–25)
CALCIUM: 8.8 mg/dL (ref 8.6–10.3)
CO2: 27 mmol/L (ref 20–31)
Chloride: 106 mmol/L (ref 98–110)
Creat: 1.19 mg/dL (ref 0.60–1.35)
GFR, EST NON AFRICAN AMERICAN: 84 mL/min (ref >=60)
Glucose, Bld: 75 mg/dL (ref 65–99)
Potassium: 4.3 mmol/L (ref 3.5–5.3)
SODIUM: 139 mmol/L (ref 135–146)

## 2016-03-24 LAB — LIPID PANEL
CHOLESTEROL: 141 mg/dL (ref 125–200)
HDL: 56 mg/dL (ref >=40)
LDL Cholesterol: 73 mg/dL (ref <130)
TRIGLYCERIDES: 60 mg/dL (ref <150)
Total CHOL/HDL Ratio: 2.5 Ratio (ref <=5.0)
VLDL: 12 mg/dL (ref <30)

## 2016-03-25 LAB — FLUORESCENT TREPONEMAL AB(FTA)-IGG-BLD: FLUORESCENT TREPONEMAL ABS: REACTIVE — AB

## 2016-03-25 LAB — T-HELPER CELL (CD4) - (RCID CLINIC ONLY)
CD4 T CELL ABS: 810 /uL (ref 400–2700)
CD4 T CELL HELPER: 33 % (ref 33–55)

## 2016-03-25 LAB — URINE CYTOLOGY ANCILLARY ONLY
Chlamydia: NEGATIVE
Neisseria Gonorrhea: NEGATIVE

## 2016-03-25 LAB — HIV-1 RNA QUANT-NO REFLEX-BLD
HIV 1 RNA QUANT: 1290 {copies}/mL — AB (ref <20)
HIV-1 RNA Quant, Log: 3.11 Log copies/mL — ABNORMAL HIGH (ref <1.30)

## 2016-03-25 LAB — RPR TITER: RPR Titer: 1:2 {titer}

## 2016-03-25 LAB — RPR: RPR Ser Ql: REACTIVE — AB

## 2016-04-08 ENCOUNTER — Ambulatory Visit (INDEPENDENT_AMBULATORY_CARE_PROVIDER_SITE_OTHER): Payer: Medicaid Other | Admitting: Internal Medicine

## 2016-04-08 ENCOUNTER — Encounter: Payer: Self-pay | Admitting: Internal Medicine

## 2016-04-08 VITALS — BP 111/75 | HR 98 | Temp 98.2°F | Ht 69.0 in | Wt 149.0 lb

## 2016-04-08 DIAGNOSIS — F314 Bipolar disorder, current episode depressed, severe, without psychotic features: Secondary | ICD-10-CM | POA: Diagnosis not present

## 2016-04-08 DIAGNOSIS — Z23 Encounter for immunization: Secondary | ICD-10-CM | POA: Diagnosis not present

## 2016-04-08 DIAGNOSIS — B20 Human immunodeficiency virus [HIV] disease: Secondary | ICD-10-CM | POA: Diagnosis not present

## 2016-04-08 NOTE — Assessment & Plan Note (Signed)
willhold off on starting ARVs while he gets his bipolar disease under control.

## 2016-04-08 NOTE — Progress Notes (Signed)
   Subjective:    Patient ID: Blake Chambers, male    DOB: 09-11-1990, 25 y.o.   MRN: [id number]  HPI  Here for follow up with hiv and bipolar disease. He has not been able to restart his HIV medications.  Is having issues with his bipolar disease and reports a lot of stressors that keep his mood unstable. Has been off of Abilify.  Living with a roommate.  Eating ok.    Review of Systems  Constitutional: Negative for fatigue.  Skin: Negative for rash.  Neurological: Negative for light-headedness and headaches.  Psychiatric/Behavioral: Negative for self-injury, sleep disturbance and suicidal ideas.       Objective:   Physical Exam  Constitutional: He appears well-developed and well-nourished.  Eyes: No scleral icterus.  Skin: No rash noted.  Psychiatric:  Poor eye contact, rapid but not pressured speech; appropriate concern with symptoms          Assessment & Plan:

## 2016-04-08 NOTE — Assessment & Plan Note (Signed)
He was seen by our counselor and wants to get into psychiatric care and will refer.  Also with [facility] in [place].   Will follow up in 4 weeks

## 2016-04-09 ENCOUNTER — Telehealth: Payer: Self-pay | Admitting: Lab

## 2016-04-09 NOTE — Telephone Encounter (Signed)
Thank you :)

## 2016-04-09 NOTE — Telephone Encounter (Signed)
Yesterday, I LM with pt's significant other to give me a call Zollie Beckers([place]).  I didn't get a call back so I reach out to pt again today.  I gave him the phone [facility] to [facility] [phone]) and explained to him that I  could not make the appmt for him because they will ask  some questions that he needs to answer before giving him an appmt.  I also told him that if he has any problems to please let me know so that I could persue other resources.

## 2016-04-15 ENCOUNTER — Ambulatory Visit: Payer: Medicaid Other | Admitting: *Deleted

## 2016-04-15 DIAGNOSIS — F1510 Other stimulant abuse, uncomplicated: Secondary | ICD-10-CM

## 2016-04-15 NOTE — BH Specialist Note (Signed)
Blake Chambers showed for his appointment today but was an hour late.  Patient was oriented times four with good affect and dress.  Patient was alert and

## 2016-04-24 ENCOUNTER — Ambulatory Visit: Payer: Self-pay | Admitting: *Deleted

## 2016-05-01 ENCOUNTER — Ambulatory Visit: Payer: Self-pay | Admitting: Internal Medicine

## 2016-06-21 ENCOUNTER — Encounter (HOSPITAL_COMMUNITY): Payer: Self-pay

## 2016-06-21 ENCOUNTER — Emergency Department (HOSPITAL_COMMUNITY)
Admission: EM | Admit: 2016-06-21 | Discharge: 2016-06-22 | Disposition: A | Discharge status: Home / Self Care | Payer: Medicaid Other | Attending: Dermatology | Admitting: Dermatology

## 2016-06-21 DIAGNOSIS — Z5321 Procedure and treatment not carried out due to patient leaving prior to being seen by health care provider: Secondary | ICD-10-CM | POA: Insufficient documentation

## 2016-06-21 DIAGNOSIS — F17210 Nicotine dependence, cigarettes, uncomplicated: Secondary | ICD-10-CM | POA: Diagnosis not present

## 2016-06-21 DIAGNOSIS — R51 Headache: Secondary | ICD-10-CM | POA: Insufficient documentation

## 2016-06-21 DIAGNOSIS — F419 Anxiety disorder, unspecified: Secondary | ICD-10-CM | POA: Diagnosis not present
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
## 2016-06-22 NOTE — ED Notes (Signed)
Pt called for room x3 using first and last name with no answer. Lead Tech notified of failure to answer.

## 2016-06-22 NOTE — ED Notes (Signed)
Pt called 3 times to go back to a room and no response.

## 2016-07-07 ENCOUNTER — Other Ambulatory Visit: Payer: Self-pay

## 2016-07-15 ENCOUNTER — Ambulatory Visit: Payer: Self-pay | Admitting: *Deleted

## 2016-07-15 DIAGNOSIS — B20 Human immunodeficiency virus [HIV] disease: Secondary | ICD-10-CM

## 2016-07-22 ENCOUNTER — Ambulatory Visit (INDEPENDENT_AMBULATORY_CARE_PROVIDER_SITE_OTHER): Payer: Medicaid Other | Admitting: Internal Medicine

## 2016-07-22 ENCOUNTER — Encounter: Payer: Self-pay | Admitting: Internal Medicine

## 2016-07-22 VITALS — BP 120/81 | HR 65 | Temp 98.6°F | Wt 161.0 lb

## 2016-07-22 DIAGNOSIS — B20 Human immunodeficiency virus [HIV] disease: Secondary | ICD-10-CM | POA: Diagnosis not present

## 2016-07-22 DIAGNOSIS — N182 Chronic kidney disease, stage 2 (mild): Secondary | ICD-10-CM | POA: Diagnosis not present

## 2016-07-22 DIAGNOSIS — F314 Bipolar disorder, current episode depressed, severe, without psychotic features: Secondary | ICD-10-CM

## 2016-07-22 LAB — CBC WITH DIFFERENTIAL/PLATELET
BASOS ABS: 70 {cells}/uL (ref 0–200)
Basophils Relative: 1 %
EOS ABS: 210 {cells}/uL (ref 15–500)
EOS PCT: 3 %
HCT: 44 % (ref 38.5–50.0)
Hemoglobin: 13.8 g/dL (ref 13.2–17.1)
LYMPHS PCT: 38 %
Lymphs Abs: 2660 cells/uL (ref 850–3900)
MCH: 26.4 pg — AB (ref 27.0–33.0)
MCHC: 31.4 g/dL — AB (ref 32.0–36.0)
MCV: 84.3 fL (ref 80.0–100.0)
MONOS PCT: 9 %
MPV: 10.5 fL (ref 7.5–12.5)
Monocytes Absolute: 630 cells/uL (ref 200–950)
NEUTROS PCT: 49 %
Neutro Abs: 3430 cells/uL (ref 1500–7800)
PLATELETS: 239 10*3/uL (ref 140–400)
RBC: 5.22 MIL/uL (ref 4.20–5.80)
RDW: 16.3 % — AB (ref 11.0–15.0)
WBC: 7 10*3/uL (ref 3.8–10.8)

## 2016-07-22 LAB — COMPLETE METABOLIC PANEL WITH GFR
ALBUMIN: 4.1 g/dL (ref 3.6–5.1)
ALK PHOS: 72 U/L (ref 40–115)
ALT: 29 U/L (ref 9–46)
AST: 20 U/L (ref 10–40)
BILIRUBIN TOTAL: 0.3 mg/dL (ref 0.2–1.2)
BUN: 13 mg/dL (ref 7–25)
CO2: 30 mmol/L (ref 20–31)
CREATININE: 1.33 mg/dL (ref 0.60–1.35)
Calcium: 9.2 mg/dL (ref 8.6–10.3)
Chloride: 104 mmol/L (ref 98–110)
GFR, Est African American: 85 mL/min (ref >=60)
GFR, Est Non African American: 73 mL/min (ref >=60)
GLUCOSE: 76 mg/dL (ref 65–99)
Potassium: 4.9 mmol/L (ref 3.5–5.3)
SODIUM: 138 mmol/L (ref 135–146)
TOTAL PROTEIN: 7.2 g/dL (ref 6.1–8.1)

## 2016-07-22 MED ORDER — RITONAVIR 100 MG PO TABS: 100.0000 mg | ORAL_TABLET | Freq: Every day | ORAL | 5 refills | Status: DC

## 2016-07-22 MED ORDER — DARUNAVIR ETHANOLATE 800 MG PO TABS: 800.0000 mg | ORAL_TABLET | Freq: Every day | ORAL | 5 refills | Status: DC

## 2016-07-22 MED ORDER — EMTRICITABINE-TENOFOVIR AF 200-25 MG PO TABS: 1.0000 | ORAL_TABLET | Freq: Every day | ORAL | 5 refills | Status: DC

## 2016-07-22 MED ORDER — ARIPIPRAZOLE 10 MG PO TABS: 10.0000 mg | ORAL_TABLET | Freq: Every day | ORAL | 5 refills | Status: DC

## 2016-07-22 NOTE — Assessment & Plan Note (Signed)
Will recheck creat today 

## 2016-07-22 NOTE — Assessment & Plan Note (Signed)
Really unclear if he is taking his medications or not.  I will check today and if not, rtc about 2 months and recheck then. Otherwise can rtc 3 months.

## 2016-07-22 NOTE — Progress Notes (Signed)
CC: Follow up for HIV  Interval history: Currently is asymptomatic and on darunavir, norvir and Descovy.  He tells me he has been taking his ARVs fine since last visit in August, though the pharmacy does not have a record of him filling it since 2016.  No other new issues.  Has no associated n/v/d.  Denies any missed doses.     Also has bipolar disease and following [facility] and insomnia, now made worse with a puppy he is caring for.    Prior to Admission medications   Medication Sig Start Date End Date Taking? Authorizing Provider  bacitracin ointment Apply 1 application topically 2 (two) times daily. 01/28/16  Yes Charlynne Panderavid Hsienta Yao, MD  Darunavir Ethanolate (PREZISTA) 800 MG tablet Take 1 tablet (800 mg total) by mouth daily. 07/22/16  Yes Gardiner Barefootobert W Shemia Bevel, MD  emtricitabine-tenofovir AF (DESCOVY) 200-25 MG tablet Take 1 tablet by mouth daily. 07/22/16  Yes Gardiner Barefootobert W Asiah Browder, MD  ritonavir (NORVIR) 100 MG TABS tablet Take 1 tablet (100 mg total) by mouth daily. 07/22/16  Yes Gardiner Barefootobert W Sopheap Basic, MD  ARIPiprazole (ABILIFY) 10 MG tablet Take 1 tablet (10 mg total) by mouth daily. 07/22/16   Gardiner Barefootobert W Kayra Crowell, MD    Review of Systems Constitutional: negative for malaise, anorexia and weight loss Respiratory: negative for cough Gastrointestinal: negative for diarrhea Integument/breast: negative for rash All other systems reviewed and are negative    Physical Exam: CONSTITUTIONAL:in no apparent distress  Vitals:   07/22/16 0927  BP: 120/81  Pulse: 65  Temp: 98.6 F (37 C)   Eyes: anicteric HENT: no thrush, no cervical lymphadenopathy Respiratory: Normal respiratory effort; CTA B GI: soft, nt, nd  Lab Results  Component Value Date   HIV1RNAQUANT 1,290 (H) 03/24/2016   HIV1RNAQUANT 1,577 (H) 07/04/2015   HIV1RNAQUANT 21 01/16/2015   No components found for: HIV1GENOTYPRPLUS No components found for: THELPERCELL   SH: no alcohol

## 2016-07-22 NOTE — Assessment & Plan Note (Signed)
I will restart his abilify; [facility]

## 2016-07-23 LAB — T-HELPER CELL (CD4) - (RCID CLINIC ONLY)
CD4 % Helper T Cell: 36 % (ref 33–55)
CD4 T Cell Abs: 1030 /uL (ref 400–2700)

## 2016-07-25 LAB — HIV-1 RNA QUANT-NO REFLEX-BLD
HIV 1 RNA Quant: 122 copies/mL — ABNORMAL HIGH
HIV-1 RNA QUANT, LOG: 2.09 {Log_copies}/mL — AB

## 2016-07-28 ENCOUNTER — Ambulatory Visit: Payer: Medicaid Other

## 2016-07-28 DIAGNOSIS — F3160 Bipolar disorder, current episode mixed, unspecified: Secondary | ICD-10-CM
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
## 2016-07-28 NOTE — Progress Notes (Signed)
RN received a referral from Dr Luciana Axeomer for trouble with medication adherence. RN has struggled to connect with the patient but was able to meet him at [facility]. RN explained to Raji my role at [facility] and how I can offer assistance to him with staying on his medications. Tige agreed to my services and consent,  RN reviewed Engineer, building servicesAgency Services, [place] Notice of Arboriculturistrivacy Policy, Home Safety Management Information Booklet. Home Fire Safety Assessment, Fall Risk Assessment and Suicide Risk Assessment was performed. At this time the patient declined the informative Home Care Providers booklet due to privacy concerns and transit lifestyle. RN also discussed information on a Living Will, Advanced Directives, and Health Care Power of [place]. RN educated/reviewed and had Eh sign Client Agreement and Consent for Service form along with Patient Rights and Responsibilities statement. RN developed patient specific and centered care plan. RN provided contact information and reviewed how to receive emergency help after hours for schedule changes, reporting of safety issues, falls, concerns or any needs/questions. Standard Precaution and Infection control along with interventions to correct or prevent high risk behaviors instructed to the patient. Shondale reports understanding and agreement with the above.   Kylor explained that he has been struggling to stay on his medications and has not been able to get his medications. RN instructed [name] that I will be happy to follow up on his medications. RN instructed [name] that I will bring the medications back to [facility] if he will still be there. [name] stated [facility] and he will be there until later in the day.   RN contacted [facility] who stated the patient has not picked up his medications since 2016. RN requested a refill of the patient's medications and will pick the medications up for the  patient  RN arrived to pick the medications up and delivered the medications to [facility] around 1pm. Once I arrived [name] was no longer at [facility] and he does not have a phone number that I can reach him on.

## 2016-08-05 ENCOUNTER — Ambulatory Visit: Payer: Medicaid Other

## 2016-08-05 DIAGNOSIS — F3160 Bipolar disorder, current episode mixed, unspecified: Secondary | ICD-10-CM

## 2016-08-05 DIAGNOSIS — F4310 Post-traumatic stress disorder, unspecified: Secondary | ICD-10-CM

## 2016-08-05 NOTE — BH Specialist Note (Signed)
Blake Chambers reported that his sister died last week and he is going to the [place] in [place] this Friday. He is working on Diplomatic Services operational officerwriting a poem for the service and is going to read it there. I praised him for doing this. We completed an assessment for Family Service and wrote goals of maintaining stability and lowering anxiety. Plan to meet again in one week. Franne FortsKenny Shaleah Nissley, LCSW

## 2016-08-12 ENCOUNTER — Ambulatory Visit: Payer: Medicaid Other

## 2016-08-26 ENCOUNTER — Other Ambulatory Visit: Payer: Self-pay | Admitting: Pharmacist

## 2016-08-26 ENCOUNTER — Ambulatory Visit (INDEPENDENT_AMBULATORY_CARE_PROVIDER_SITE_OTHER): Payer: Medicaid Other | Admitting: Pharmacist

## 2016-08-26 DIAGNOSIS — F3163 Bipolar disorder, current episode mixed, severe, without psychotic features: Secondary | ICD-10-CM

## 2016-08-26 DIAGNOSIS — Z720 Tobacco use: Secondary | ICD-10-CM

## 2016-08-26 DIAGNOSIS — B20 Human immunodeficiency virus [HIV] disease: Secondary | ICD-10-CM | POA: Diagnosis not present

## 2016-08-26 MED ORDER — EMTRICITABINE-TENOFOVIR AF 200-25 MG PO TABS: 1.0000 | ORAL_TABLET | Freq: Every day | ORAL | 5 refills | Status: DC

## 2016-08-26 MED ORDER — ARIPIPRAZOLE 10 MG PO TABS: 10.0000 mg | ORAL_TABLET | Freq: Every day | ORAL | 5 refills | Status: DC

## 2016-08-26 MED ORDER — NICOTINE 21 MG/24HR TD PT24: 21.0000 mg | MEDICATED_PATCH | Freq: Every day | TRANSDERMAL | 0 refills | Status: DC

## 2016-08-26 MED ORDER — DARUNAVIR-COBICISTAT 800-150 MG PO TABS: 1.0000 | ORAL_TABLET | Freq: Every day | ORAL | 11 refills | Status: DC

## 2016-08-26 MED ORDER — DARUNAVIR-COBICISTAT 800-150 MG PO TABS: 1.0000 | ORAL_TABLET | Freq: Every day | ORAL | 5 refills | Status: DC

## 2016-08-26 MED FILL — ARIPiprazole 10 MG TABS: 10 | 30 days supply | Qty: 30 | Fill #0

## 2016-08-26 MED FILL — PREZCOBIX 800 MG-150 MG TAB: 800-150 | 30 days supply | Qty: 30 | Fill #0

## 2016-08-26 MED FILL — NICOTINE 21 MG/24HR PATCH: 21 | 28 days supply | Qty: 28 | Fill #0

## 2016-08-26 MED FILL — DESCOVY 200-25 MG TABS: 200-25 | 30 days supply | Qty: 30 | Fill #0

## 2016-08-26 NOTE — Progress Notes (Signed)
HPI: Blake Chambers is a 26 y.o. male who presents to the [facility] clinic for follow-up of his HIV infection.   Allergies: Allergies  Allergen Reactions  . Tenofovir Disoproxil Other (See Comments)    Intolerance, increase in creatinine    Past Medical History: Past Medical History:  Diagnosis Date  . Anxiety   . Bipolar affective (HCC)   . HIV (human immunodeficiency virus infection) (HCC)   . Immune deficiency disorder (HCC)   . Mental health disorder     Social History: Social History   Social History  . Marital status: Single    Spouse name: N/A  . Number of children: N/A  . Years of education: N/A   Social History Main Topics  . Smoking status: Current Every Day Smoker    Packs/day: 0.30    Types: Cigarettes  . Smokeless tobacco: Never Used     Comment: pt. trying to quit  . Alcohol use 0.6 oz/week    1 Standard drinks or equivalent per week     Comment: occ  . Drug use: Yes    Frequency: 1.0 time per week    Types: Marijuana, Cocaine     Comment: "once every blue moon"  . Sexual activity: Yes    Partners: Male    Birth control/ protection: Condom     Comment: pt. given condoms   Other Topics Concern  . Not on file   Social History Narrative  . No narrative on file    Current Regimen: Prezista + Norvir + Descovy  Labs: HIV 1 RNA Quant (copies/mL)  Date Value  07/22/2016 122 (H)  03/24/2016 1,290 (H)  07/04/2015 1,577 (H)   CD4 T Cell Abs (/uL)  Date Value  07/22/2016 1,030  03/24/2016 810  07/04/2015 1,090   Hep B S Ab (no units)  Date Value  10/10/2013 POS (A)   Hepatitis B Surface Ag (no units)  Date Value  01/16/2011 NEGATIVE   HCV Ab (no units)  Date Value  01/16/2011 NEGATIVE    CrCl: CrCl cannot be calculated (Patient's most recent lab result is older than the maximum 21 days allowed.).  Lipids:    Component Value Date/Time   CHOL 141 03/24/2016 1558   TRIG 60 03/24/2016 1558   HDL 56 03/24/2016 1558   CHOLHDL  2.5 03/24/2016 1558   VLDL 12 03/24/2016 1558   LDLCALC 73 03/24/2016 1558    Assessment: Blake Chambers is here today to follow-up for his long standing noncompliance with his HIV medications. He has been on and off of his medications since being diagnosed several years ago (he thinks in 2012). He has mostly had trouble taking his medications due to issues with his bipolar diagnosis.  He tells me today that he is doing well.  He has taken his medications consistently for 3 weeks now except for 1 missed dose due to him having a fever and feeling awful.  He wears a mask today as he said he is getting over a cold/virus. He takes his three HIV medications at night around 5-6pm with dinner. He is not having any side effects with his HIV medications - no nausea, vomiting, diarrhea, etc.   He tells me today that his family life is more stable and when it isn't, it can definitely throw him off. He just got a Rushie Chestnut puppy that he is taking care of named Blake Chambers.   He also states he stopped smoking 3 days ago. He states it is hard  for him to not smoke as he is around people that smoke a lot every day. He states his cravings are bad.  He tried the nicotine gum but did not think it worked.  He is interested in the nicotine patch.  I explained the patch to him and offered to help him manage his smoking cessation.  He would like to try it.  He smokes 1/2 PPD x 8 years = 10-12 cigarettes per day.  I will start him on the 21 mg nicotine patch x 6 weeks then see him back and titrate down to the 14 mg. He is agreeable to this.  He has also been drug free x 10 months - I congratulated him on this.  I discussed changing his Prezista/norvir to Prezcobix to eliminate one daily medication he would need to take.  He states he has no problems swallowing pills and larger pills do not bother him, therefore we will switch over to Prezcobix.  He has medicaid, so [facility] and he will come back on  Friday morning to see me and start the Prezcobix and pick up his other medications as well.  I will follow him closely.    Plans: - Stop Prezista/Norvir - Start Prezcobix 1 pill PO daily - Continue Descovy 200-25 mg PO daily - Start Nicotine patch 21 mg/24 hr patch - [facility] and courier here monthly - F/u with me again Friday 3/16 at 10:30am - F/u with Blake RedbirdKenny 3/21 at 11am - F/u with Dr. Luciana Chambers 5/8 at 9:45am  Blake Chambers L. Blake Chambers, PharmD, CPP Infectious Diseases Clinical Pharmacist [facility] 08/26/2016, 11:53 AM

## 2016-08-29 ENCOUNTER — Ambulatory Visit: Payer: Self-pay

## 2016-09-03 ENCOUNTER — Ambulatory Visit: Payer: Medicaid Other

## 2016-09-03 ENCOUNTER — Ambulatory Visit (INDEPENDENT_AMBULATORY_CARE_PROVIDER_SITE_OTHER): Payer: Medicaid Other | Admitting: Pharmacist

## 2016-09-03 DIAGNOSIS — F17200 Nicotine dependence, unspecified, uncomplicated: Secondary | ICD-10-CM

## 2016-09-03 DIAGNOSIS — B20 Human immunodeficiency virus [HIV] disease: Secondary | ICD-10-CM

## 2016-09-03 DIAGNOSIS — F259 Schizoaffective disorder, unspecified: Secondary | ICD-10-CM
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
## 2016-09-03 NOTE — Progress Notes (Signed)
HPI: Blake Chambers is a 26 y.o. male who presents to the [facility] pharmacy clinic for HIV follow-up.  Allergies: Allergies  Allergen Reactions  . Tenofovir Disoproxil Other (See Comments)    Intolerance, increase in creatinine    Past Medical History: Past Medical History:  Diagnosis Date  . Anxiety   . Bipolar affective (HCC)   . HIV (human immunodeficiency virus infection) (HCC)   . Immune deficiency disorder (HCC)   . Mental health disorder     Social History: Social History   Social History  . Marital status: Single    Spouse name: N/A  . Number of children: N/A  . Years of education: N/A   Social History Main Topics  . Smoking status: Current Every Day Smoker    Packs/day: 0.30    Types: Cigarettes  . Smokeless tobacco: Never Used     Comment: pt. trying to quit  . Alcohol use 0.6 oz/week    1 Standard drinks or equivalent per week     Comment: occ  . Drug use: Yes    Frequency: 1.0 time per week    Types: Marijuana, Cocaine     Comment: "once every blue moon"  . Sexual activity: Yes    Partners: Male    Birth control/ protection: Condom     Comment: pt. given condoms   Other Topics Concern  . Not on file   Social History Narrative  . No narrative on file    Current Regimen: Prezista/norvir + Descovy  Labs: HIV 1 RNA Quant (copies/mL)  Date Value  07/22/2016 122 (H)  03/24/2016 1,290 (H)  07/04/2015 1,577 (H)   CD4 T Cell Abs (/uL)  Date Value  07/22/2016 1,030  03/24/2016 810  07/04/2015 1,090   Hep B S Ab (no units)  Date Value  10/10/2013 POS (A)   Hepatitis B Surface Ag (no units)  Date Value  01/16/2011 NEGATIVE   HCV Ab (no units)  Date Value  01/16/2011 NEGATIVE    CrCl: CrCl cannot be calculated (Patient's most recent lab result is older than the maximum 21 days allowed.).  Lipids:    Component Value Date/Time   CHOL 141 03/24/2016 1558   TRIG 60 03/24/2016 1558   HDL 56 03/24/2016 1558   CHOLHDL 2.5 03/24/2016 1558    VLDL 12 03/24/2016 1558   LDLCALC 73 03/24/2016 1558    Assessment: Blake Chambers is here today to see Blake Chambers.  He was supposed to see me last Friday on 3/23 but no showed. I was able to meet with him after his appointment with Blake Chambers today.  I gave him his medications [facility] - Prezcobix, Descovy, Abilify, and his nicotine patches.   I explained to him to stop taking his Prezista/norvir and to start taking the Prezcobix.  I also explained how to use his nicotine patches.  I told him to apply one patch daily - apply it in the morning and change patches each morning. I told him if he starts having vivid dreams or insomnia (common side effects) that he can take it off before he goes to bed.  He states he already has nightmares and vivid dreams, so I told him to take it off before he goes to bed and reapply one each morning.  I also told him he was ok to chew some nicotine gum during the day when he has intense cravings.    I will bring him back in a month to reassess his smoking  cessation and possibly decrease his nicotine patches to 14 mg/day. Blake Chambers is working with him.   Plans: - Stop Prezista/norvir - Start Prezcobix 1 tablet PO daily - Continue Descovy 1 tablet PO daily - Start nicotine patches 21 mg/day - switch patch every morning - F/u with me again 4/25 at 10:30am  Blake Chambers L. Quandarius Nill, PharmD, CPP Infectious Diseases Clinical Pharmacist [facility] 09/03/2016, 1:58 PM

## 2016-09-15 ENCOUNTER — Ambulatory Visit: Payer: Medicaid Other

## 2016-09-15 DIAGNOSIS — F259 Schizoaffective disorder, unspecified: Secondary | ICD-10-CM
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
## 2016-09-16 ENCOUNTER — Telehealth: Payer: Self-pay | Admitting: *Deleted

## 2016-09-16 ENCOUNTER — Ambulatory Visit: Payer: Self-pay | Admitting: *Deleted

## 2016-09-16 VITALS — BP 120/86

## 2016-09-16 DIAGNOSIS — B20 Human immunodeficiency virus [HIV] disease: Secondary | ICD-10-CM

## 2016-09-30 ENCOUNTER — Ambulatory Visit: Payer: Medicaid Other

## 2016-09-30 DIAGNOSIS — F259 Schizoaffective disorder, unspecified: Secondary | ICD-10-CM

## 2016-09-30 MED FILL — ARIPiprazole 10 MG TABS: 10 | 30 days supply | Qty: 30 | Fill #1

## 2016-09-30 MED FILL — NICOTINE 21 MG/24HR PATCH: 21 | 14 days supply | Qty: 14 | Fill #1

## 2016-09-30 MED FILL — DESCOVY 200-25 MG TABS: 200-25 | 30 days supply | Qty: 30 | Fill #1

## 2016-09-30 MED FILL — PREZCOBIX 800 MG-150 MG TAB: 800-150 | 30 days supply | Qty: 30 | Fill #1
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
## 2016-10-02 ENCOUNTER — Ambulatory Visit (INDEPENDENT_AMBULATORY_CARE_PROVIDER_SITE_OTHER): Payer: Medicaid Other | Admitting: Pharmacist

## 2016-10-02 DIAGNOSIS — B20 Human immunodeficiency virus [HIV] disease: Secondary | ICD-10-CM
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
## 2016-10-06 NOTE — Progress Notes (Signed)
RN made a home visit with Blake Chambers today and his partner Blake Chambers of over 81yrs per [employer]. Blake Chambers states he continues to take his medications daily as ordered without any complications. Today we reviewed Blake Chambers's most recent lab results with explanations for what the lab work represents (Viral Load, CD4). Blake Chambers appears to be doing well with further visits plan to assess adherence and provide assistance with removal of barriers to car/adherence as needed

## 2016-10-08 ENCOUNTER — Ambulatory Visit: Payer: Self-pay

## 2016-10-13 ENCOUNTER — Encounter (HOSPITAL_COMMUNITY): Payer: Self-pay | Admitting: Emergency Medicine

## 2016-10-13 ENCOUNTER — Emergency Department (HOSPITAL_COMMUNITY)
Admission: EM | Admit: 2016-10-13 | Discharge: 2016-10-13 | Disposition: A | Discharge status: Home / Self Care | Payer: Medicaid Other | Attending: Emergency Medicine | Admitting: Emergency Medicine

## 2016-10-13 DIAGNOSIS — Y939 Activity, unspecified: Secondary | ICD-10-CM | POA: Insufficient documentation

## 2016-10-13 DIAGNOSIS — Y999 Unspecified external cause status: Secondary | ICD-10-CM | POA: Insufficient documentation

## 2016-10-13 DIAGNOSIS — W500XXA Accidental hit or strike by another person, initial encounter: Secondary | ICD-10-CM | POA: Insufficient documentation

## 2016-10-13 DIAGNOSIS — M79605 Pain in left leg: Secondary | ICD-10-CM | POA: Diagnosis not present

## 2016-10-13 DIAGNOSIS — R51 Headache: Secondary | ICD-10-CM | POA: Diagnosis present

## 2016-10-13 DIAGNOSIS — Y929 Unspecified place or not applicable: Secondary | ICD-10-CM | POA: Insufficient documentation

## 2016-10-13 DIAGNOSIS — Z5321 Procedure and treatment not carried out due to patient leaving prior to being seen by health care provider: Secondary | ICD-10-CM | POA: Diagnosis not present
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
## 2016-10-13 NOTE — ED Notes (Signed)
Called to room but no response.
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
## 2016-10-21 ENCOUNTER — Other Ambulatory Visit (HOSPITAL_COMMUNITY)
Admission: RE | Admit: 2016-10-21 | Discharge: 2016-10-21 | Disposition: A | Discharge status: Home / Self Care | Payer: Medicaid Other | Source: Ambulatory Visit | Attending: Internal Medicine | Admitting: Internal Medicine

## 2016-10-21 ENCOUNTER — Ambulatory Visit (INDEPENDENT_AMBULATORY_CARE_PROVIDER_SITE_OTHER): Payer: Medicaid Other | Admitting: Internal Medicine

## 2016-10-21 ENCOUNTER — Encounter: Payer: Self-pay | Admitting: Internal Medicine

## 2016-10-21 VITALS — BP 110/74 | HR 67 | Temp 98.0°F | Ht 69.0 in | Wt 153.0 lb

## 2016-10-21 DIAGNOSIS — B20 Human immunodeficiency virus [HIV] disease: Secondary | ICD-10-CM | POA: Insufficient documentation

## 2016-10-21 DIAGNOSIS — Z23 Encounter for immunization: Secondary | ICD-10-CM

## 2016-10-21 DIAGNOSIS — F17200 Nicotine dependence, unspecified, uncomplicated: Secondary | ICD-10-CM | POA: Diagnosis not present

## 2016-10-21 DIAGNOSIS — F314 Bipolar disorder, current episode depressed, severe, without psychotic features: Secondary | ICD-10-CM | POA: Diagnosis not present

## 2016-10-21 DIAGNOSIS — Z113 Encounter for screening for infections with a predominantly sexual mode of transmission: Secondary | ICD-10-CM

## 2016-10-21 NOTE — Progress Notes (Signed)
   Subjective:    Patient ID: Blake Chambers, male    DOB: January 13, 1991, 26 y.o.   MRN: [id number]  HPI Here for follow up of HIV. Has a long history of poor compliance and mental illness.  Has been recently on Prezcobix and Descovy and [facility].  Last CD4 was 1,030 and viral load down to 122.  Has been working with PharmD.  Reports continued good compliance with medications and no missed doses he can recall. He continues on the nicotine patch and not smoking and feels better.  No current depression and feels it is helped a lot with Abilify.  Has gained some weight, eating well.    Review of Systems  Constitutional: Negative for fatigue.  Gastrointestinal: Negative for diarrhea.  Psychiatric/Behavioral: Negative for sleep disturbance and suicidal ideas.       Objective:   Physical Exam  Constitutional: He appears well-developed and well-nourished. No distress.  HENT:  Mouth/Throat: No oropharyngeal exudate.  Eyes: No scleral icterus.  Cardiovascular: Normal rate, regular rhythm and normal heart sounds.   Lymphadenopathy:    He has no cervical adenopathy.  Skin: No rash noted.   SH: no current tobacco       Assessment & Plan:

## 2016-10-21 NOTE — Assessment & Plan Note (Signed)
Encouraged continued cessation and congratulated him on success so far.

## 2016-10-21 NOTE — Assessment & Plan Note (Signed)
Labs today. He is follow up with Pharm D next week and can go over the results. He can see me in 4 months.

## 2016-10-21 NOTE — Assessment & Plan Note (Signed)
Will screen today 

## 2016-10-21 NOTE — Addendum Note (Signed)
Addended by: Wendall MolaOCKERHAM, JACQUELINE A on: 10/21/2016 10:25 AM   Modules accepted: Orders

## 2016-10-21 NOTE — Assessment & Plan Note (Signed)
He seems to be doing well now with just the Abilify which I prescribe.  Will continue this and will get him to follow up with our counselor and/or mental health as available.

## 2016-10-22 LAB — URINE CYTOLOGY ANCILLARY ONLY
Chlamydia: NEGATIVE
NEISSERIA GONORRHEA: NEGATIVE

## 2016-10-22 LAB — T-HELPER CELL (CD4) - (RCID CLINIC ONLY)
CD4 % Helper T Cell: 41 % (ref 33–55)
CD4 T CELL ABS: 1030 /uL (ref 400–2700)

## 2016-10-22 LAB — RPR TITER: RPR Titer: 1:1 {titer}

## 2016-10-22 LAB — RPR: RPR Ser Ql: REACTIVE — AB

## 2016-10-22 LAB — FLUORESCENT TREPONEMAL AB(FTA)-IGG-BLD: Fluorescent Treponemal ABS: REACTIVE — AB

## 2016-10-23 LAB — HIV-1 RNA,QN PCR W/REFLEX GENOTYPE
HIV-1 RNA, QN PCR: 2.92 {Log_copies}/mL — AB
HIV-1 RNA, QN PCR: 829 {copies}/mL — AB

## 2016-10-28 ENCOUNTER — Ambulatory Visit: Payer: Self-pay

## 2016-10-30 ENCOUNTER — Ambulatory Visit (INDEPENDENT_AMBULATORY_CARE_PROVIDER_SITE_OTHER): Payer: Medicaid Other | Admitting: Pharmacist

## 2016-10-30 DIAGNOSIS — B20 Human immunodeficiency virus [HIV] disease: Secondary | ICD-10-CM

## 2016-10-30 DIAGNOSIS — Z720 Tobacco use: Secondary | ICD-10-CM | POA: Diagnosis not present

## 2016-10-30 MED ORDER — NICOTINE 21 MG/24HR TD PT24: 21.0000 mg | MEDICATED_PATCH | Freq: Every day | TRANSDERMAL | 4 refills | Status: DC

## 2016-10-30 MED FILL — ARIPiprazole 10 MG TABS: 10 | 30 days supply | Qty: 30 | Fill #2

## 2016-10-30 MED FILL — PREZCOBIX 800 MG-150 MG TAB: 800-150 | 30 days supply | Qty: 30 | Fill #2

## 2016-10-30 MED FILL — DESCOVY 200-25 MG TABS: 200-25 | 30 days supply | Qty: 30 | Fill #2

## 2016-10-30 MED FILL — NICOTINE 21 MG/24HR PATCH: 21 | 28 days supply | Qty: 28 | Fill #0

## 2016-10-30 NOTE — Progress Notes (Signed)
HPI: Blake Chambers is a 26 y.o. male who presents to the [facility] pharmacy clinic to pick up his medications.   Allergies: Allergies  Allergen Reactions  . Tenofovir Disoproxil Other (See Comments)    Intolerance, increase in creatinine    Past Medical History: Past Medical History:  Diagnosis Date  . Anxiety   . Bipolar affective (HCC)   . HIV (human immunodeficiency virus infection) (HCC)   . Immune deficiency disorder (HCC)   . Mental health disorder     Social History: Social History   Social History  . Marital status: Single    Spouse name: N/A  . Number of children: N/A  . Years of education: N/A   Social History Main Topics  . Smoking status: Current Every Day Smoker    Packs/day: 0.30    Types: Cigarettes  . Smokeless tobacco: Never Used     Comment: pt. trying to quit  . Alcohol use 0.6 oz/week    1 Standard drinks or equivalent per week     Comment: occ  . Drug use: No  . Sexual activity: Yes    Partners: Male    Birth control/ protection: Condom     Comment: pt. given condoms   Other Topics Concern  . Not on file   Social History Narrative  . No narrative on file    Current Regimen: Prezcobix + Descovy  Labs: HIV 1 RNA Quant (copies/mL)  Date Value  07/22/2016 122 (H)  03/24/2016 1,290 (H)  07/04/2015 1,577 (H)   CD4 T Cell Abs (/uL)  Date Value  10/21/2016 1,030  07/22/2016 1,030  03/24/2016 810   Hep B S Ab (no units)  Date Value  10/10/2013 POS (A)   Hepatitis B Surface Ag (no units)  Date Value  01/16/2011 NEGATIVE   HCV Ab (no units)  Date Value  01/16/2011 NEGATIVE    CrCl: CrCl cannot be calculated (Patient's most recent lab result is older than the maximum 21 days allowed.).  Lipids:    Component Value Date/Time   CHOL 141 03/24/2016 1558   TRIG 60 03/24/2016 1558   HDL 56 03/24/2016 1558   CHOLHDL 2.5 03/24/2016 1558   VLDL 12 03/24/2016 1558   LDLCALC 73 03/24/2016 1558    Assessment: Blake Chambers is here  today to pick up his medications [facility]. He states he is doing really well lately.  He also tells me that he has not missed any doses in quite some time.  He states none all month.  I told him his recent HIV viral load was 829, so it is likely he missed more than he is telling me.  He states he is taking them every day now and has 5-6 left in his bottles at home.  He hasn't smoked in 2 days and says the nicotine patches work and he needs more.  He will come back Tuesday to pick them up.  I will get labs again next month when he sees me again to pick up his monthly medications.   Plans: - Continue Prezcobix + Descovy - Refill nicotine patches 21 mg daily - F/u with me again 6/18 at 3:30 pm  Netanya Yazdani L. Gatha Mcnulty, PharmD, CPP Infectious Diseases Clinical Pharmacist [facility] 10/30/2016, 3:20 PM

## 2016-11-04 ENCOUNTER — Ambulatory Visit: Payer: Self-pay

## 2016-12-01 ENCOUNTER — Ambulatory Visit: Payer: Self-pay

## 2016-12-01 NOTE — Telephone Encounter (Signed)
RN has received several referrals for Blake Chambers. Contact was able to be made on 09/16/16 with orders received from Dr Luciana Axeomer approving a 1D1 for evaluation of the patient's needs.Patient was evaluated on 09/16/16  for [facility]NS. Patient was consented to care at this time.   Frequency / Duration of CBHCN visits Effective 09/16/16: 432mo3, 611mo1,  4 PRN's for complications with disease process/progression, medication changes or concerns   HIV RN Case Manager will assess for learning needs related to diagnosis and treatment regimen, provide education as needed, fill pill box if needed, address any barriers which may be preventing medication compliance, adherence to treatment plan and offer linkage to [facility]. RN will continuously communicate with the [facility] integrated care team including physician and Community case managers.   Individualized Plan Of Care Certification Period of 09/16/16 to 12/15/2016  a. Type of service(s) and care to be delivered: RN Case Management  b. Frequency and duration of service: Effective 09/16/16: 332mo3, 171mo1, 4 prns for  complications  with disease process/progression, medication changes or  concerns . Visits/Contact may be conducted telephonically or in person to best suit the patient.  c. Activity restrictions: Pt may be up as tolerated and can safely ambulate  without the need for a assistive device   d. Safety Measures: Standard Precautions/Infection Control   e. Service Objectives and Goals: Service Objectives are to assist the pt with HIV medication regimen adherence and staying in care with the Infectious Disease Clinic by identifying barriers to care. RN will address the barriers that are identified by the patient. Patient centered goal is so stay on his medications.   f. Equipment required: No additional equipment needs at this time   g. Functional Limitations: None Noted  h. Rehabilitation potential: Guarded   i. Diet and Nutritional Needs: Regular Diet   j.  Medications and treatments: Medications have been reconciled and reviewed and are a part of EPIC electronic file   k. Specific therapies if needed: RN   l. Pertinent diagnoses: HIV disease,  Hx of medication NonCompliance, Bipolar disorder, Severe Cocaine Use   m. Expected outcome: Guarded

## 2016-12-02 NOTE — Telephone Encounter (Signed)
I agree with the POC as outlined with no changes.

## 2016-12-09 ENCOUNTER — Telehealth: Payer: Self-pay | Admitting: Pharmacist

## 2016-12-09 NOTE — Telephone Encounter (Signed)
Hendryx no showed pharmacy clinic appointment last week to pick up medications.  Called and left message with Kathlene NovemberMike who shares phone with [name] to call me back.

## 2016-12-16 ENCOUNTER — Telehealth: Payer: Self-pay | Admitting: *Deleted

## 2017-01-09 NOTE — Telephone Encounter (Signed)
PATIENT ON HOLD  Plan of Care orders have expired effective 12/16/16 but GOALS have not been completely meet at this time. I would like to connect with the patient and received further orders from MD. If I am unable to get in contact with her within the next 30 days I will have to discharge at that time. RN WILL NOT RESUME CARE UNTIL NEW MD ORDERS OBTAINED AND PATIENT ABLE/WILLING TO RE-ENGAGE IN CARE

## 2017-01-16 ENCOUNTER — Telehealth: Payer: Self-pay | Admitting: *Deleted

## 2017-02-03 ENCOUNTER — Other Ambulatory Visit: Payer: Medicaid Other

## 2017-02-03 ENCOUNTER — Other Ambulatory Visit: Payer: Self-pay | Admitting: *Deleted

## 2017-02-03 DIAGNOSIS — B20 Human immunodeficiency virus [HIV] disease: Secondary | ICD-10-CM

## 2017-02-03 LAB — CBC WITH DIFFERENTIAL/PLATELET
BASOS ABS: 68 {cells}/uL (ref 0–200)
Basophils Relative: 1 %
EOS ABS: 136 {cells}/uL (ref 15–500)
Eosinophils Relative: 2 %
HEMATOCRIT: 45.4 % (ref 38.5–50.0)
Hemoglobin: 14.9 g/dL (ref 13.2–17.1)
LYMPHS PCT: 40 %
Lymphs Abs: 2720 cells/uL (ref 850–3900)
MCH: 26.9 pg — AB (ref 27.0–33.0)
MCHC: 32.8 g/dL (ref 32.0–36.0)
MCV: 81.9 fL (ref 80.0–100.0)
MONO ABS: 612 {cells}/uL (ref 200–950)
MONOS PCT: 9 %
MPV: 10.5 fL (ref 7.5–12.5)
NEUTROS PCT: 48 %
Neutro Abs: 3264 cells/uL (ref 1500–7800)
PLATELETS: 294 10*3/uL (ref 140–400)
RBC: 5.54 MIL/uL (ref 4.20–5.80)
RDW: 16 % — AB (ref 11.0–15.0)
WBC: 6.8 10*3/uL (ref 3.8–10.8)

## 2017-02-04 LAB — COMPLETE METABOLIC PANEL WITH GFR
ALT: 20 U/L (ref 9–46)
AST: 24 U/L (ref 10–40)
Albumin: 4.2 g/dL (ref 3.6–5.1)
Alkaline Phosphatase: 74 U/L (ref 40–115)
BILIRUBIN TOTAL: 0.5 mg/dL (ref 0.2–1.2)
BUN: 8 mg/dL (ref 7–25)
CHLORIDE: 104 mmol/L (ref 98–110)
CO2: 24 mmol/L (ref 20–32)
CREATININE: 1.33 mg/dL (ref 0.60–1.35)
Calcium: 9.5 mg/dL (ref 8.6–10.3)
GFR, EST AFRICAN AMERICAN: 84 mL/min (ref >=60)
GFR, Est Non African American: 73 mL/min (ref >=60)
GLUCOSE: 93 mg/dL (ref 65–99)
Potassium: 4.4 mmol/L (ref 3.5–5.3)
Sodium: 140 mmol/L (ref 135–146)
TOTAL PROTEIN: 7.1 g/dL (ref 6.1–8.1)

## 2017-02-04 LAB — T-HELPER CELL (CD4) - (RCID CLINIC ONLY)
CD4 % Helper T Cell: 36 % (ref 33–55)
CD4 T Cell Abs: 1010 /uL (ref 400–2700)

## 2017-02-05 ENCOUNTER — Ambulatory Visit: Payer: Self-pay

## 2017-02-06 LAB — HIV-1 RNA QUANT-NO REFLEX-BLD
HIV 1 RNA Quant: <20 copies/mL — AB
HIV-1 RNA QUANT, LOG: DETECTED {Log_copies}/mL — AB

## 2017-02-24 ENCOUNTER — Ambulatory Visit (INDEPENDENT_AMBULATORY_CARE_PROVIDER_SITE_OTHER): Payer: Medicaid Other | Admitting: Internal Medicine

## 2017-02-24 ENCOUNTER — Encounter: Payer: Self-pay | Admitting: Internal Medicine

## 2017-02-24 VITALS — BP 107/60 | HR 91 | Temp 98.3°F | Ht 69.0 in | Wt 147.0 lb

## 2017-02-24 DIAGNOSIS — Z113 Encounter for screening for infections with a predominantly sexual mode of transmission: Secondary | ICD-10-CM

## 2017-02-24 DIAGNOSIS — F314 Bipolar disorder, current episode depressed, severe, without psychotic features: Secondary | ICD-10-CM

## 2017-02-24 DIAGNOSIS — B20 Human immunodeficiency virus [HIV] disease: Secondary | ICD-10-CM

## 2017-02-24 DIAGNOSIS — N182 Chronic kidney disease, stage 2 (mild): Secondary | ICD-10-CM | POA: Diagnosis not present

## 2017-02-24 DIAGNOSIS — Z79899 Other long term (current) drug therapy: Secondary | ICD-10-CM | POA: Diagnosis not present

## 2017-02-24 DIAGNOSIS — F17200 Nicotine dependence, unspecified, uncomplicated: Secondary | ICD-10-CM | POA: Diagnosis not present

## 2017-02-24 NOTE — Assessment & Plan Note (Signed)
Doing well.  Encouraged continued cessation

## 2017-02-24 NOTE — Assessment & Plan Note (Signed)
He continues to do well, compliant.  rtc 3 months

## 2017-02-24 NOTE — Assessment & Plan Note (Signed)
I have him continued on Abilify.

## 2017-02-24 NOTE — Assessment & Plan Note (Signed)
Creat remains wnl

## 2017-02-24 NOTE — Progress Notes (Signed)
   Subjective:    Patient ID: Blake Chambers, male    DOB: 1991-02-02, 26 y.o.   MRN: [id number]  HPI Here for follow up of HIV. Has a long history of poor compliance and mental illness.  Has been on Prezcobix and Descovy and [facility].  Last CD4 was 1,010 and viral load <20.  He reports continued good compliance with medications and no missed doses he can recall. He continues on the nicotine patch and not smoking and feels better.  No current depression and feels it is helped a lot with Abilify.     Review of Systems  Constitutional: Negative for fatigue.  Gastrointestinal: Negative for diarrhea.  Psychiatric/Behavioral: Negative for sleep disturbance and suicidal ideas.       Objective:   Physical Exam  Constitutional: He appears well-developed and well-nourished. No distress.  HENT:  Mouth/Throat: No oropharyngeal exudate.  Eyes: No scleral icterus.  Cardiovascular: Normal rate, regular rhythm and normal heart sounds.   Lymphadenopathy:    He has no cervical adenopathy.  Skin: No rash noted.   SH: no current tobacco       Assessment & Plan:

## 2017-03-04 ENCOUNTER — Ambulatory Visit (INDEPENDENT_AMBULATORY_CARE_PROVIDER_SITE_OTHER): Payer: Medicaid Other | Admitting: *Deleted

## 2017-03-04 DIAGNOSIS — Z23 Encounter for immunization: Secondary | ICD-10-CM | POA: Diagnosis not present

## 2017-05-12 ENCOUNTER — Other Ambulatory Visit: Payer: Self-pay

## 2017-05-26 ENCOUNTER — Ambulatory Visit: Payer: Self-pay | Admitting: Internal Medicine

## 2017-06-18 ENCOUNTER — Other Ambulatory Visit (HOSPITAL_COMMUNITY)
Admission: RE | Admit: 2017-06-18 | Discharge: 2017-06-18 | Disposition: A | Discharge status: Home / Self Care | Payer: Medicaid Other | Source: Ambulatory Visit | Attending: Internal Medicine | Admitting: Internal Medicine

## 2017-06-18 ENCOUNTER — Other Ambulatory Visit: Payer: Medicaid Other

## 2017-06-18 DIAGNOSIS — Z113 Encounter for screening for infections with a predominantly sexual mode of transmission: Secondary | ICD-10-CM | POA: Insufficient documentation

## 2017-06-18 DIAGNOSIS — B20 Human immunodeficiency virus [HIV] disease: Secondary | ICD-10-CM

## 2017-06-18 DIAGNOSIS — Z79899 Other long term (current) drug therapy: Secondary | ICD-10-CM

## 2017-06-18 LAB — LIPID PANEL
Cholesterol: 155 mg/dL (ref <200)
HDL: 79 mg/dL (ref >40)
LDL Cholesterol (Calc): 65 mg/dL (calc)
Non-HDL Cholesterol (Calc): 76 mg/dL (calc) (ref <130)
Total CHOL/HDL Ratio: 2 (calc) (ref <5.0)
Triglycerides: 37 mg/dL (ref <150)

## 2017-06-19 LAB — COMPLETE METABOLIC PANEL WITH GFR
AG Ratio: 1.7 (calc) (ref 1.0–2.5)
ALKALINE PHOSPHATASE (APISO): 71 U/L (ref 40–115)
ALT: 19 U/L (ref 9–46)
AST: 25 U/L (ref 10–40)
Albumin: 4.3 g/dL (ref 3.6–5.1)
BUN: 12 mg/dL (ref 7–25)
CO2: 25 mmol/L (ref 20–32)
CREATININE: 1.05 mg/dL (ref 0.60–1.35)
Calcium: 9 mg/dL (ref 8.6–10.3)
Chloride: 105 mmol/L (ref 98–110)
GFR, EST NON AFRICAN AMERICAN: 97 mL/min/{1.73_m2} (ref >=60)
GFR, Est African American: 112 mL/min/{1.73_m2} (ref >=60)
GLOBULIN: 2.6 g/dL (ref 1.9–3.7)
Glucose, Bld: 62 mg/dL — ABNORMAL LOW (ref 65–99)
Potassium: 4.4 mmol/L (ref 3.5–5.3)
SODIUM: 138 mmol/L (ref 135–146)
Total Bilirubin: 0.5 mg/dL (ref 0.2–1.2)
Total Protein: 6.9 g/dL (ref 6.1–8.1)

## 2017-06-19 LAB — URINE CYTOLOGY ANCILLARY ONLY
Chlamydia: NEGATIVE
Neisseria Gonorrhea: NEGATIVE

## 2017-06-19 LAB — T-HELPER CELL (CD4) - (RCID CLINIC ONLY)
CD4 T CELL HELPER: 32 % — AB (ref 33–55)
CD4 T Cell Abs: 1040 /uL (ref 400–2700)

## 2017-06-19 LAB — CBC WITH DIFFERENTIAL/PLATELET
Basophils Absolute: 19 cells/uL (ref 0–200)
Basophils Relative: 0.3 %
EOS ABS: 63 {cells}/uL (ref 15–500)
Eosinophils Relative: 1 %
HCT: 41.7 % (ref 38.5–50.0)
Hemoglobin: 13.4 g/dL (ref 13.2–17.1)
Lymphs Abs: 3081 cells/uL (ref 850–3900)
MCH: 26.2 pg — ABNORMAL LOW (ref 27.0–33.0)
MCHC: 32.1 g/dL (ref 32.0–36.0)
MCV: 81.4 fL (ref 80.0–100.0)
MONOS PCT: 10 %
MPV: 11.7 fL (ref 7.5–12.5)
NEUTROS PCT: 39.8 %
Neutro Abs: 2507 cells/uL (ref 1500–7800)
PLATELETS: 250 10*3/uL (ref 140–400)
RBC: 5.12 10*6/uL (ref 4.20–5.80)
RDW: 14.5 % (ref 11.0–15.0)
TOTAL LYMPHOCYTE: 48.9 %
WBC: 6.3 10*3/uL (ref 3.8–10.8)
WBCMIX: 630 {cells}/uL (ref 200–950)

## 2017-06-19 LAB — RPR TITER: RPR Titer: 1:2 {titer} — ABNORMAL HIGH

## 2017-06-19 LAB — FLUORESCENT TREPONEMAL AB(FTA)-IGG-BLD: Fluorescent Treponemal ABS: REACTIVE — AB

## 2017-06-19 LAB — RPR: RPR: REACTIVE — AB

## 2017-06-19 NOTE — Telephone Encounter (Signed)
The intent of this communication is to inform the Health Care Team that this patient will be discharged from [place] with GOALS MET Moving forward, the [facility] will be willing to reopen the patient to services if and when the patient is ready to discuss medication adherence and HIV disease  management. Effective 01/16/17  patient will be discharged and removed from [facility] active patient listing.

## 2017-06-22 LAB — HIV-1 RNA QUANT-NO REFLEX-BLD
HIV 1 RNA QUANT: 5010 {copies}/mL — AB
HIV-1 RNA QUANT, LOG: 3.7 {Log_copies}/mL — AB

## 2017-06-30 ENCOUNTER — Ambulatory Visit: Payer: Self-pay | Admitting: Internal Medicine

## 2017-08-20 ENCOUNTER — Emergency Department (HOSPITAL_COMMUNITY): Payer: Medicaid Other | Admitting: Anesthesiology

## 2017-08-20 ENCOUNTER — Emergency Department (HOSPITAL_COMMUNITY): Payer: Medicaid Other

## 2017-08-20 ENCOUNTER — Encounter (HOSPITAL_COMMUNITY): Payer: Self-pay

## 2017-08-20 ENCOUNTER — Inpatient Hospital Stay (HOSPITAL_COMMUNITY)
Admission: EM | Admit: 2017-08-20 | Discharge: 2017-08-22 | DRG: 580 | Discharge status: Against Medical Advice | Payer: Medicaid Other | Attending: Family Medicine | Admitting: Family Medicine

## 2017-08-20 ENCOUNTER — Encounter (HOSPITAL_COMMUNITY)
Admission: EM | Discharge status: Against Medical Advice | Payer: Self-pay | Source: Home / Self Care | Attending: Family Medicine

## 2017-08-20 DIAGNOSIS — K612 Anorectal abscess: Secondary | ICD-10-CM | POA: Diagnosis present

## 2017-08-20 DIAGNOSIS — A539 Syphilis, unspecified: Secondary | ICD-10-CM | POA: Diagnosis present

## 2017-08-20 DIAGNOSIS — K611 Rectal abscess: Secondary | ICD-10-CM | POA: Diagnosis present

## 2017-08-20 DIAGNOSIS — F17200 Nicotine dependence, unspecified, uncomplicated: Secondary | ICD-10-CM | POA: Diagnosis not present

## 2017-08-20 DIAGNOSIS — N182 Chronic kidney disease, stage 2 (mild): Secondary | ICD-10-CM | POA: Diagnosis present

## 2017-08-20 DIAGNOSIS — L02415 Cutaneous abscess of right lower limb: Principal | ICD-10-CM | POA: Diagnosis present

## 2017-08-20 DIAGNOSIS — Z9114 Patient's other noncompliance with medication regimen: Secondary | ICD-10-CM

## 2017-08-20 DIAGNOSIS — F1410 Cocaine abuse, uncomplicated: Secondary | ICD-10-CM | POA: Diagnosis present

## 2017-08-20 DIAGNOSIS — F319 Bipolar disorder, unspecified: Secondary | ICD-10-CM

## 2017-08-20 DIAGNOSIS — F17210 Nicotine dependence, cigarettes, uncomplicated: Secondary | ICD-10-CM | POA: Diagnosis present

## 2017-08-20 DIAGNOSIS — B20 Human immunodeficiency virus [HIV] disease: Secondary | ICD-10-CM | POA: Diagnosis present

## 2017-08-20 DIAGNOSIS — F1910 Other psychoactive substance abuse, uncomplicated: Secondary | ICD-10-CM | POA: Diagnosis not present

## 2017-08-20 DIAGNOSIS — L0291 Cutaneous abscess, unspecified: Secondary | ICD-10-CM | POA: Diagnosis not present

## 2017-08-20 DIAGNOSIS — Z79899 Other long term (current) drug therapy: Secondary | ICD-10-CM | POA: Diagnosis not present

## 2017-08-20 DIAGNOSIS — F1210 Cannabis abuse, uncomplicated: Secondary | ICD-10-CM | POA: Diagnosis present

## 2017-08-20 HISTORY — PX: IRRIGATION AND DEBRIDEMENT ABSCESS: SHX5252

## 2017-08-20 LAB — COMPREHENSIVE METABOLIC PANEL
ALT: 26 U/L (ref 17–63)
AST: 31 U/L (ref 15–41)
Albumin: 3.2 g/dL — ABNORMAL LOW (ref 3.5–5.0)
Alkaline Phosphatase: 81 U/L (ref 38–126)
Anion gap: 9 (ref 5–15)
BUN: 9 mg/dL (ref 6–20)
CO2: 23 mmol/L (ref 22–32)
Calcium: 8.7 mg/dL — ABNORMAL LOW (ref 8.9–10.3)
Chloride: 105 mmol/L (ref 101–111)
Creatinine, Ser: 0.92 mg/dL (ref 0.61–1.24)
GFR calc Af Amer: >60 mL/min (ref >60)
GFR calc non Af Amer: >60 mL/min (ref >60)
Glucose, Bld: 88 mg/dL (ref 65–99)
Potassium: 4.1 mmol/L (ref 3.5–5.1)
Sodium: 137 mmol/L (ref 135–145)
Total Bilirubin: 0.4 mg/dL (ref 0.3–1.2)
Total Protein: 6.8 g/dL (ref 6.5–8.1)

## 2017-08-20 LAB — CBC WITH DIFFERENTIAL/PLATELET
Basophils Absolute: 0 10*3/uL (ref 0.0–0.1)
Basophils Relative: 0 %
Eosinophils Absolute: 0.2 10*3/uL (ref 0.0–0.7)
Eosinophils Relative: 2 %
HCT: 41.4 % (ref 39.0–52.0)
Hemoglobin: 13.2 g/dL (ref 13.0–17.0)
Lymphocytes Relative: 25 %
Lymphs Abs: 2.8 10*3/uL (ref 0.7–4.0)
MCH: 26 pg (ref 26.0–34.0)
MCHC: 31.9 g/dL (ref 30.0–36.0)
MCV: 81.7 fL (ref 78.0–100.0)
Monocytes Absolute: 0.9 10*3/uL (ref 0.1–1.0)
Monocytes Relative: 8 %
Neutro Abs: 7.2 10*3/uL (ref 1.7–7.7)
Neutrophils Relative %: 65 %
Platelets: 236 10*3/uL (ref 150–400)
RBC: 5.07 MIL/uL (ref 4.22–5.81)
RDW: 15.8 % — ABNORMAL HIGH (ref 11.5–15.5)
WBC: 11.2 10*3/uL — ABNORMAL HIGH (ref 4.0–10.5)

## 2017-08-20 LAB — URINALYSIS, ROUTINE W REFLEX MICROSCOPIC
Bilirubin Urine: NEGATIVE
Glucose, UA: NEGATIVE mg/dL
Hgb urine dipstick: NEGATIVE
Ketones, ur: NEGATIVE mg/dL
Leukocytes, UA: NEGATIVE
Nitrite: NEGATIVE
Protein, ur: NEGATIVE mg/dL
Specific Gravity, Urine: 1.03 (ref 1.005–1.030)
pH: 5 (ref 5.0–8.0)

## 2017-08-20 SURGERY — IRRIGATION AND DEBRIDEMENT ABSCESS
Anesthesia: General | Site: Buttocks

## 2017-08-20 MED ORDER — FENTANYL CITRATE (PF) 100 MCG/2ML IJ SOLN: 25.0000 ug | INTRAMUSCULAR | Status: DC | PRN

## 2017-08-20 MED ORDER — OXYCODONE HCL 5 MG PO TABS: 5.0000 mg | ORAL_TABLET | Freq: Once | ORAL | Status: DC | PRN

## 2017-08-20 MED ORDER — MORPHINE SULFATE (PF) 4 MG/ML IV SOLN: 2.0000 mg | INTRAVENOUS | Status: DC | PRN

## 2017-08-20 MED ORDER — POLYETHYLENE GLYCOL 3350 17 G PO PACK: 17.0000 g | PACK | Freq: Every day | ORAL | Status: DC | PRN

## 2017-08-20 MED ORDER — ACETAMINOPHEN 325 MG PO TABS: 325.0000 mg | ORAL_TABLET | ORAL | Status: DC | PRN

## 2017-08-20 MED ORDER — ONDANSETRON HCL 4 MG/2ML IJ SOLN
INTRAMUSCULAR | Status: DC | PRN
  Administered 2017-08-20: 4 mg via INTRAVENOUS

## 2017-08-20 MED ORDER — ACETAMINOPHEN 160 MG/5ML PO SOLN: 325.0000 mg | ORAL | Status: DC | PRN

## 2017-08-20 MED ORDER — IOPAMIDOL (ISOVUE-300) INJECTION 61%
INTRAVENOUS | Status: AC
  Administered 2017-08-20: 75 mL via INTRAVENOUS
  Filled 2017-08-20: qty 75

## 2017-08-20 MED ORDER — ACETAMINOPHEN 325 MG PO TABS
650.0000 mg | ORAL_TABLET | Freq: Four times a day (QID) | ORAL | Status: DC | PRN
  Administered 2017-08-21: 650 mg via ORAL
  Filled 2017-08-20: qty 2

## 2017-08-20 MED ORDER — ROCURONIUM BROMIDE 100 MG/10ML IV SOLN
INTRAVENOUS | Status: DC | PRN
Start: 2017-08-20 — End: 2017-08-20
  Administered 2017-08-20: 50 mg via INTRAVENOUS

## 2017-08-20 MED ORDER — MIDAZOLAM HCL 2 MG/2ML IJ SOLN
INTRAMUSCULAR | Status: AC
  Filled 2017-08-20: qty 2

## 2017-08-20 MED ORDER — DOCUSATE SODIUM 100 MG PO CAPS: 100.0000 mg | ORAL_CAPSULE | Freq: Every day | ORAL | Status: DC | PRN

## 2017-08-20 MED ORDER — ONDANSETRON HCL 4 MG PO TABS: 4.0000 mg | ORAL_TABLET | Freq: Four times a day (QID) | ORAL | Status: DC | PRN

## 2017-08-20 MED ORDER — HYDROMORPHONE HCL 1 MG/ML IJ SOLN
1.0000 mg | Freq: Once | INTRAMUSCULAR | Status: AC
  Administered 2017-08-20: 1 mg via INTRAVENOUS
  Filled 2017-08-20: qty 1

## 2017-08-20 MED ORDER — DEXAMETHASONE SODIUM PHOSPHATE 10 MG/ML IJ SOLN
INTRAMUSCULAR | Status: DC | PRN
Start: 2017-08-20 — End: 2017-08-20
  Administered 2017-08-20: 10 mg via INTRAVENOUS

## 2017-08-20 MED ORDER — ONDANSETRON HCL 4 MG/2ML IJ SOLN: 4.0000 mg | Freq: Four times a day (QID) | INTRAMUSCULAR | Status: DC | PRN

## 2017-08-20 MED ORDER — SUGAMMADEX SODIUM 500 MG/5ML IV SOLN
INTRAVENOUS | Status: DC | PRN
  Administered 2017-08-20: 200 mg via INTRAVENOUS

## 2017-08-20 MED ORDER — MORPHINE SULFATE (PF) 4 MG/ML IV SOLN
4.0000 mg | Freq: Once | INTRAVENOUS | Status: AC
  Administered 2017-08-20: 4 mg via INTRAVENOUS
  Filled 2017-08-20: qty 1

## 2017-08-20 MED ORDER — PROPOFOL 10 MG/ML IV BOLUS
INTRAVENOUS | Status: DC | PRN
  Administered 2017-08-20: 120 mg via INTRAVENOUS
  Administered 2017-08-20: 30 mg via INTRAVENOUS

## 2017-08-20 MED ORDER — DEXTROSE-NACL 5-0.45 % IV SOLN
INTRAVENOUS | Status: AC
  Administered 2017-08-20: 19:00:00 via INTRAVENOUS

## 2017-08-20 MED ORDER — LACTATED RINGERS IV SOLN
INTRAVENOUS | Status: DC
  Administered 2017-08-20 – 2017-08-21 (×2): via INTRAVENOUS

## 2017-08-20 MED ORDER — LIDOCAINE HCL (CARDIAC) 20 MG/ML IV SOLN
INTRAVENOUS | Status: DC | PRN
Start: 2017-08-20 — End: 2017-08-20
  Administered 2017-08-20: 80 mg via INTRATRACHEAL

## 2017-08-20 MED ORDER — DEXMEDETOMIDINE HCL 200 MCG/2ML IV SOLN
INTRAVENOUS | Status: DC | PRN
  Administered 2017-08-20 (×2): 8 ug via INTRAVENOUS
  Administered 2017-08-20: 12 ug via INTRAVENOUS

## 2017-08-20 MED ORDER — FENTANYL CITRATE (PF) 250 MCG/5ML IJ SOLN
INTRAMUSCULAR | Status: DC | PRN
  Administered 2017-08-20 (×3): 50 ug via INTRAVENOUS

## 2017-08-20 MED ORDER — EMTRICITABINE-TENOFOVIR AF 200-25 MG PO TABS
1.0000 | ORAL_TABLET | Freq: Every day | ORAL | Status: DC
  Administered 2017-08-20 – 2017-08-22 (×3): 1 via ORAL
  Filled 2017-08-20 (×3): qty 1

## 2017-08-20 MED ORDER — 0.9 % SODIUM CHLORIDE (POUR BTL) OPTIME
TOPICAL | Status: DC | PRN
  Administered 2017-08-20: 1000 mL

## 2017-08-20 MED ORDER — PROPOFOL 10 MG/ML IV BOLUS
INTRAVENOUS | Status: AC
  Filled 2017-08-20: qty 20

## 2017-08-20 MED ORDER — DARUNAVIR-COBICISTAT 800-150 MG PO TABS
1.0000 | ORAL_TABLET | Freq: Every day | ORAL | Status: DC
  Administered 2017-08-21 – 2017-08-22 (×2): 1 via ORAL
  Filled 2017-08-20 (×2): qty 1

## 2017-08-20 MED ORDER — OXYCODONE HCL 5 MG/5ML PO SOLN: 5.0000 mg | Freq: Once | ORAL | Status: DC | PRN

## 2017-08-20 MED ORDER — FENTANYL CITRATE (PF) 250 MCG/5ML IJ SOLN
INTRAMUSCULAR | Status: AC
  Filled 2017-08-20: qty 5

## 2017-08-20 MED ORDER — MIDAZOLAM HCL 5 MG/5ML IJ SOLN
INTRAMUSCULAR | Status: DC | PRN
  Administered 2017-08-20: 2 mg via INTRAVENOUS

## 2017-08-20 MED ORDER — ACETAMINOPHEN 650 MG RE SUPP: 650.0000 mg | Freq: Four times a day (QID) | RECTAL | Status: DC | PRN

## 2017-08-20 MED ORDER — PIPERACILLIN-TAZOBACTAM 3.375 G IVPB
3.3750 g | Freq: Three times a day (TID) | INTRAVENOUS | Status: DC
  Administered 2017-08-20 – 2017-08-22 (×6): 3.375 g via INTRAVENOUS
  Filled 2017-08-20 (×8): qty 50

## 2017-08-20 MED ORDER — PHENYLEPHRINE HCL 10 MG/ML IJ SOLN
INTRAMUSCULAR | Status: DC | PRN
  Administered 2017-08-20: 40 ug via INTRAVENOUS

## 2017-08-20 SURGICAL SUPPLY — 36 items
BLADE CLIPPER SURG (BLADE) IMPLANT
BNDG GAUZE ELAST 4 BULKY (GAUZE/BANDAGES/DRESSINGS) IMPLANT
CANISTER SUCT 3000ML PPV (MISCELLANEOUS) ×2 IMPLANT
COVER SURGICAL LIGHT HANDLE (MISCELLANEOUS) ×2 IMPLANT
DRAPE LAPAROSCOPIC ABDOMINAL (DRAPES) IMPLANT
DRAPE LAPAROTOMY 100X72 PEDS (DRAPES) IMPLANT
DRAPE UTILITY XL STRL (DRAPES) ×2 IMPLANT
DRSG PAD ABDOMINAL 8X10 ST (GAUZE/BANDAGES/DRESSINGS) IMPLANT
ELECT CAUTERY BLADE 6.4 (BLADE) ×2 IMPLANT
ELECT REM PT RETURN 9FT ADLT (ELECTROSURGICAL) ×2
ELECTRODE REM PT RTRN 9FT ADLT (ELECTROSURGICAL) ×1 IMPLANT
GAUZE PACKING IODOFORM 1/2 (PACKING) IMPLANT
GAUZE PACKING IODOFORM 1/4X15 (GAUZE/BANDAGES/DRESSINGS) ×2 IMPLANT
GAUZE SPONGE 4X4 12PLY STRL (GAUZE/BANDAGES/DRESSINGS) IMPLANT
GLOVE BIO SURGEON STRL SZ7.5 (GLOVE) ×2 IMPLANT
GLOVE INDICATOR 8.0 STRL GRN (GLOVE) ×2 IMPLANT
GOWN STRL REUS W/ TWL LRG LVL3 (GOWN DISPOSABLE) ×1 IMPLANT
GOWN STRL REUS W/ TWL XL LVL3 (GOWN DISPOSABLE) ×1 IMPLANT
GOWN STRL REUS W/TWL LRG LVL3 (GOWN DISPOSABLE) ×1
GOWN STRL REUS W/TWL XL LVL3 (GOWN DISPOSABLE) ×1
KIT BASIN OR (CUSTOM PROCEDURE TRAY) ×2 IMPLANT
KIT ROOM TURNOVER OR (KITS) ×2 IMPLANT
NS IRRIG 1000ML POUR BTL (IV SOLUTION) ×2 IMPLANT
PACK LITHOTOMY IV (CUSTOM PROCEDURE TRAY) IMPLANT
PACK SURGICAL SETUP 50X90 (CUSTOM PROCEDURE TRAY) IMPLANT
PAD ARMBOARD 7.5X6 YLW CONV (MISCELLANEOUS) ×2 IMPLANT
PENCIL BUTTON HOLSTER BLD 10FT (ELECTRODE) ×2 IMPLANT
SPONGE LAP 18X18 X RAY DECT (DISPOSABLE) ×2 IMPLANT
SWAB COLLECTION DEVICE MRSA (MISCELLANEOUS) IMPLANT
SWAB CULTURE ESWAB REG 1ML (MISCELLANEOUS) IMPLANT
TOWEL OR 17X24 6PK STRL BLUE (TOWEL DISPOSABLE) ×2 IMPLANT
TOWEL OR 17X26 10 PK STRL BLUE (TOWEL DISPOSABLE) ×2 IMPLANT
TUBE ANAEROBIC SPECIMEN COL (MISCELLANEOUS) IMPLANT
TUBE CONNECTING 12X1/4 (SUCTIONS) ×2 IMPLANT
UNDERPAD 30X30 INCONTINENT (UNDERPADS AND DIAPERS) ×2 IMPLANT
YANKAUER SUCT BULB TIP NO VENT (SUCTIONS) ×2 IMPLANT

## 2017-08-20 NOTE — Consult Note (Addendum)
Blake Chambers 03/10/1991  [id number].    Requesting MD: Dr. Quintella Reichert Chief Complaint/Reason for Consult: perirectal abscess  HPI:  This is a 27 yo BM with HIV, bipolar disorder who presents to [facility] with a 2 day history of perirectal and buttock pain.  He denies any fevers or chills.  He states he has not had a BM since this started as it hurts to attempt this.  Due to persistent pain he presented to the ED for evaluation.  He had a pelvic CT which was relatively unremarkable, but was noted to have drainage from a perirectal abscess.  We were consulted for further evaluation of this problem.  Of note, he sees Dr. Linus Salmons in the ID clinic for his HIV.  He has not been taking his antiretrovirals but does not elaborate as to why he hasn't been taking them.  He last saw Dr. Linus Salmons in January.  His CD4 count at the time was just over 1,000.  His WBC today is 11.2.  He denies any other complaints.   ROS: Please see HPI, otherwise all other systems are negative.  History reviewed. No pertinent family history.  Past Medical History:  Diagnosis Date  . Anxiety   . Bipolar affective ([place])   . HIV (human immunodeficiency virus infection) ([place])   . Immune deficiency disorder ([place])   . Mental health disorder     History reviewed. No pertinent surgical history.  Social History:  reports that he has been smoking cigarettes.  He has been smoking about 0.30 packs per day. he has never used smokeless tobacco. He reports that he does not drink alcohol or use drugs.  Allergies:  Allergies  Allergen Reactions  . Tenofovir Disoproxil Other (See Comments)    Intolerance, increase in creatinine     (Not in a hospital admission)   Physical Exam: Blood pressure 108/79, pulse 82, temperature 97.6 F (36.4 C), temperature source Oral, resp. rate (!) 21, height '5\' 9"'  (1.753 m), SpO2 100 %. General: pleasant, anxious, black male who is laying in bed in pain HEENT: head is normocephalic,  atraumatic.  Sclera are noninjected.  PERRL.  Ears and nose without any masses or lesions.  Mouth is pink and moist Heart: regular, rate, and rhythm.  Normal s1,s2. No obvious murmurs, gallops, or rubs noted.  Palpable radial and pedal pulses bilaterally Lungs: CTAB, no wheezes, rhonchi, or rales noted.  Respiratory effort nonlabored Abd: soft, NT, ND, +BS, no masses, hernias, or organomegaly MS: all 4 extremities are symmetrical with no cyanosis, clubbing, or edema. Skin: warm and dry with no masses, lesions, or rashes.  He does however have a spontaneously draining perirectal abscess.  This is at the 3 oclock position to his rectum.  He also has a 1.5x1.5cm right gluteal abscess as well.  He also has several little smaller areas on his upper posterior thigh just distal to his gluteal cleft. Psych: A&Ox3 but anxious and in mild distress secondary to pain.   Results for orders placed or performed during the hospital encounter of 08/20/17 (from the past 48 hour(s))  CBC with Differential     Status: Abnormal   Collection Time: 08/20/17  7:52 AM  Result Value Ref Range   WBC 11.2 (H) 4.0 - 10.5 K/uL   RBC 5.07 4.22 - 5.81 MIL/uL   Hemoglobin 13.2 13.0 - 17.0 g/dL   HCT 41.4 39.0 - 52.0 %   MCV 81.7 78.0 - 100.0 fL   MCH 26.0  26.0 - 34.0 pg   MCHC 31.9 30.0 - 36.0 g/dL   RDW 15.8 (H) 11.5 - 15.5 %   Platelets 236 150 - 400 K/uL   Neutrophils Relative % 65 %   Neutro Abs 7.2 1.7 - 7.7 K/uL   Lymphocytes Relative 25 %   Lymphs Abs 2.8 0.7 - 4.0 K/uL   Monocytes Relative 8 %   Monocytes Absolute 0.9 0.1 - 1.0 K/uL   Eosinophils Relative 2 %   Eosinophils Absolute 0.2 0.0 - 0.7 K/uL   Basophils Relative 0 %   Basophils Absolute 0.0 0.0 - 0.1 K/uL    Comment: Performed at [facility] [address] [place], [place] [address]  Comprehensive metabolic panel     Status: Abnormal   Collection Time: 08/20/17  7:52 AM  Result Value Ref Range   Sodium 137 135 - 145 mmol/L   Potassium  4.1 3.5 - 5.1 mmol/L   Chloride 105 101 - 111 mmol/L   CO2 23 22 - 32 mmol/L   Glucose, Bld 88 65 - 99 mg/dL   BUN 9 6 - 20 mg/dL   Creatinine, Ser 0.92 0.61 - 1.24 mg/dL   Calcium 8.7 (L) 8.9 - 10.3 mg/dL   Total Protein 6.8 6.5 - 8.1 g/dL   Albumin 3.2 (L) 3.5 - 5.0 g/dL   AST 31 15 - 41 U/L   ALT 26 17 - 63 U/L   Alkaline Phosphatase 81 38 - 126 U/L   Total Bilirubin 0.4 0.3 - 1.2 mg/dL   GFR calc non Af Amer >60 >60 mL/min   GFR calc Af Amer >60 >60 mL/min    Comment: (NOTE) The eGFR has been calculated using the CKD EPI equation. This calculation has not been validated in all clinical situations. eGFR's persistently <60 mL/min signify possible Chronic Kidney Disease.    Anion gap 9 5 - 15    Comment: Performed at [facility] [address], [place], [place] [address]  Urinalysis, Routine w reflex microscopic     Status: None   Collection Time: 08/20/17  9:01 AM  Result Value Ref Range   Color, Urine YELLOW YELLOW   APPearance CLEAR CLEAR   Specific Gravity, Urine 1.030 1.005 - 1.030   pH 5.0 5.0 - 8.0   Glucose, UA NEGATIVE NEGATIVE mg/dL   Hgb urine dipstick NEGATIVE NEGATIVE   Bilirubin Urine NEGATIVE NEGATIVE   Ketones, ur NEGATIVE NEGATIVE mg/dL   Protein, ur NEGATIVE NEGATIVE mg/dL   Nitrite NEGATIVE NEGATIVE   Leukocytes, UA NEGATIVE NEGATIVE    Comment: Performed at [facility] [address], [place], [place] [address]   Ct Pelvis W Contrast  Result Date: 08/20/2017 CLINICAL DATA:  Buttock pain for the past week with possible perirectal abscess. EXAM: CT PELVIS WITH CONTRAST TECHNIQUE: Multidetector CT imaging of the pelvis was performed using the standard protocol following the bolus administration of intravenous contrast. CONTRAST:  75 cc ISOVUE-300 IOPAMIDOL (ISOVUE-300) INJECTION 61% COMPARISON:  None. FINDINGS: Urinary Tract:  No abnormality visualized. Bowel:  Unremarkable visualized pelvic bowel loops. Vascular/Lymphatic: Borderline enlarged  bilateral inguinal lymph nodes measuring up to 1.1 cm in short axis, favored reactive. No significant vascular abnormality seen. Reproductive:  Prostate is unremarkable. Other: Mild perianal fat stranding without discrete drainable fluid collection. Musculoskeletal: Partially visualized skin thickening and soft tissue induration along the right buttock at the inferior margin of the field of view. No acute osseous abnormality. IMPRESSION: 1. Mild perianal  fat stranding without discrete drainable fluid collection, although evaluation is slightly limited due to delayed phase of contrast. 2. Partially visualized skin thickening and soft tissue induration along the right buttock at the inferior margin of the field of view. This may reflect the reported history of right buttock abscess. Correlate with physical exam. Electronically Signed   By: Titus Dubin M.D.   On: 08/20/2017 08:57      Assessment/Plan 1. Perirectal/gluteal/posterior thigh abscesses Plan to proceed to the OR for I&D of all of these areas.  This has been discussed with the patient and his partner.  They both understand the risk for bleeding, infection, possible fistula or fistulous development and how that is treated.  They understand the risk of cardiopulmonary complications with GETA.  The patient is agreeable to proceed with the procedure and consent has been obtained.  Plan to give zosyn for abx coverage at this time. We have asked for medicine to admit this patient given his HIV status and need to restart and manage these medications.    HIV-per medicine Bipolar disorder - per medicine  FEN - NPO VTE - SCDs in OR ID - [place], [facility] Surgery 08/20/2017, 12:03 PM Pager: [phone] Consults: [phone] Mon-Fri 7:00 am-4:30 pm Sat-Sun 7:00 am-11:30 am

## 2017-08-20 NOTE — Anesthesia Procedure Notes (Signed)
Procedure Name: Intubation Date/Time: 08/20/2017 1:10 PM Performed by: Marena ChancyBeckner, Mathea Frieling S, CRNA Pre-anesthesia Checklist: Patient identified, Emergency Drugs available, Suction available and Patient being monitored Patient Re-evaluated:Patient Re-evaluated prior to induction Oxygen Delivery Method: Circle System Utilized Preoxygenation: Pre-oxygenation with 100% oxygen Induction Type: IV induction Ventilation: Mask ventilation without difficulty Laryngoscope Size: Miller and 2 Grade View: Grade III Tube type: Oral Tube size: 7.5 mm Number of attempts: 2 Airway Equipment and Method: Stylet and Oral airway Placement Confirmation: ETT inserted through vocal cords under direct vision,  positive ETCO2 and breath sounds checked- equal and bilateral Tube secured with: Tape Dental Injury: Teeth and Oropharynx as per pre-operative assessment

## 2017-08-20 NOTE — Progress Notes (Signed)
[facility] RN from the ED.

## 2017-08-20 NOTE — Progress Notes (Signed)
Patient arrived to 6n26, alert and oriented mildly drowsy, IV fluids infusing. Report from PACU given via telephone. Patient noted to have an incision on the right buttocks with abd pain and mesh panties that is clean/dry/intact.  Pain moderate, will medicate per orders. Family at bedside, will continue to monitor.

## 2017-08-20 NOTE — Progress Notes (Signed)
All abscess cavities were opened. He had multiple posterior thigh abscesses as well as 2 perianal abscesses. He will need to restart his HAART therapy; would empirically continue IV abx for the next 24-48hrs (or longer as deemed appropriate by medicine/infectious disease). All purulent fluid was drained and there was no significant cellulitis seen  Stephanie Couphristopher M. Cliffton AstersWhite, M.D. General and Colorectal Surgery [facility]
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
## 2017-08-20 NOTE — Transfer of Care (Signed)
Immediate Anesthesia Transfer of Care Note  Patient: Blake Chambers  Procedure(s) Performed: IRRIGATION AND DEBRIDEMENT PERI RECTAL ABSCESS (N/A Buttocks)  Patient Location: PACU  Anesthesia Type:General  Level of Consciousness: awake, alert  and oriented  Airway & Oxygen Therapy: Patient Spontanous Breathing and Patient connected to nasal cannula oxygen  Post-op Assessment: Report given to RN, Post -op Vital signs reviewed and stable and Patient moving all extremities X 4  Post vital signs: Reviewed and stable  Last Vitals:  Vitals:   08/20/17 1015 08/20/17 1157  BP:  108/79  Pulse: 69 82  Resp:  (!) 21  Temp:    SpO2: 100% 100%    Last Pain:  Vitals:   08/20/17 1204  TempSrc:   PainSc: 7          Complications: No apparent anesthesia complications

## 2017-08-20 NOTE — Progress Notes (Signed)
Paaged by ED for admission to family med service for medical management of this patient as surgery will be addressing abscess in the OR.  Patient's vitals were WNL and ED had no immediate concerns requiring medical intervention.  Patient taken to surgery already when we went to evaluate, temp admit orders placed, will exam patient and produce full H/P once out of surgery  -Dr. Parke SimmersBland

## 2017-08-20 NOTE — Anesthesia Preprocedure Evaluation (Signed)
Anesthesia Evaluation  Patient identified by MRN, date of birth, ID band Patient awake    Reviewed: Allergy & Precautions, NPO status , Patient's Chart, lab work & pertinent test results  History of Anesthesia Complications Negative for: history of anesthetic complications  Airway Mallampati: III  TM Distance: >3 FB Neck ROM: Full    Dental  (+) Teeth Intact   Pulmonary neg shortness of breath, neg COPD, neg recent URI, Current Smoker,    breath sounds clear to auscultation       Cardiovascular negative cardio ROS   Rhythm:Regular     Neuro/Psych negative neurological ROS     GI/Hepatic negative GI ROS, Neg liver ROS,   Endo/Other  negative endocrine ROS  Renal/GU CRFRenal disease     Musculoskeletal   Abdominal   Peds  Hematology  (+) HIV,   Anesthesia Other Findings   Reproductive/Obstetrics                             Anesthesia Physical Anesthesia Plan  ASA: III  Anesthesia Plan: General   Post-op Pain Management:    Induction: Intravenous  PONV Risk Score and Plan: 1 and Ondansetron and Dexamethasone  Airway Management Planned: Oral ETT  Additional Equipment: None  Intra-op Plan:   Post-operative Plan: Extubation in OR  Informed Consent: I have reviewed the patients History and Physical, chart, labs and discussed the procedure including the risks, benefits and alternatives for the proposed anesthesia with the patient or authorized representative who has indicated his/her understanding and acceptance.   Dental advisory given  Plan Discussed with: CRNA and Surgeon  Anesthesia Plan Comments:         Anesthesia Quick Evaluation

## 2017-08-20 NOTE — ED Triage Notes (Signed)
[facility] for a pain in his buttocks that has been going on for a week. Pt states that there  is three abscess one on back of R leg, R buttock and one near his rectum. States they are draining fluid and blood.

## 2017-08-20 NOTE — Op Note (Signed)
08/20/2017  1:51 PM  PATIENT:  Blake Chambers  27 y.o. male  Patient Care Team: Inc, [facility] as PCP - General Comer, Belia Heman, MD as PCP - Infectious Diseases (Infectious Diseases)  PRE-OPERATIVE DIAGNOSIS:  Perianal abscesses 2. Posterior thigh/buttock abscesses  POST-OPERATIVE DIAGNOSIS:  Same  PROCEDURE:   1.  Anal exam under anesthesia 2.  Incision and drainage of perianal abscess x2 3.  Incision and drainage of right posterior thigh abscesses x4  SURGEON:  Surgeon(s): Andria Meuse, MD  ASSISTANT: none   ANESTHESIA:   general  SPECIMEN:  Abscess culture/Gram stain of right upper posterior thigh/gluteal  DISPOSITION OF SPECIMEN:  Lab  COUNTS: Sponge, needle, and instrument counts were reported correct x2 at conclusion of the procedure  PLAN OF CARE: Admission to medicine service  PATIENT DISPOSITION:  PACU - hemodynamically stable.  INDICATION: Blake Chambers is a 27 year old male with a 2-day history of perirectal/buttock/posterior thigh pain.  He has never had this kind of pain before.  Perirectal area has had some purulent drainage over the last day.  He denies history of fever/chills.  He does have a history of HIV and has previously been on HAART therapy but has not taken any of this since January of this year.  His CD4 count previously in January was noted to be greater than 1000.  In the emergency room here his Blake Chambers blood cell count was 11.  On exam he had perianal abscesses that were decompressing through the perianal skin.  There are additionally multiple right posterior thigh abscesses that were exquisitely tender to palpation.  He underwent CT scan of the pelvis which demonstrated mild perianal fat stranding without discrete drainable fluid collection; partially visualized skin thickening and soft tissue induration along the right buttock.  Given these findings the decision was made to proceed with incision and drainage of all these  abscesses. The anatomy & physiology of anorectal region was discussed.  The pathophysiology of abscesses was discussed.  Natural history risks without surgery was discussed. I recommended incision and drainage of posterior thigh abscesses and perianal/buttock abscesses. Possible seton placement. Anal exam under anesthesia  The planned procedure, material risks (including but not limited to pain, bleeding, infection, abscess, recurrence, damage to sphincter, need for additional procedures, heart attack, stroke, death).  Goals of post-operative recovery were discussed as well. He and his partner's questions were answered to their satisfaction. They both expressed understanding and elected to proceed with surgery.  OR FINDINGS: Right posterior thigh with 4 separate abscess/fluid collections.  These were individually incised and drained.  2 of them were fairly deep and to facilitate drainage the wound was landscaped by trimming the corners.  Cultures were obtained from the largest fluid collection.  2 discrete perianal abscesses largest being right posterior and the other being left anterior.  No demonstratable fistula was found on exam today.  No other anal canal abnormalities were seen during examination.  DESCRIPTION: The patient was identified in the preoperative holding area and taken to the OR where they were placed on the operating room table. SCDs were placed.  General anesthesia was induced without difficulty. The patient was then positioned in prone jackknife position with buttocks gently taped apart.  The patient was then prepped and draped in usual sterile fashion.  A surgical timeout was performed indicating the correct patient, procedure, positioning and need for preoperative antibiotics.   Attention was directed to the posterior thigh abscesses.  These were each individually opened.  The largest being the more superior one was opened and purulent fluid expressed.  This was sent for Gram stain  and culture.  Attention was then turned to performing a digital rectal exam.  A well-lubricated finger was inserted into the rectum and no palpable abnormalities were noted.  There were 2 discrete abscesses one in the right posterior position and the other in the left anterior position.  These were both opened and drained purulent fluid.  A fistula probe was used to probe the cavity and no demonstratable fistulous found inside the anal canal.  Hemostasis was then verified at each of the drainage sites.  All of the wounds were irrigated.  The larger abscess cavities were packed with quarter-inch iodoform gauze.  Clean 4 x 4 gauze was placed over the I&D sites and secured with tape.  The patient was then carefully rolled off the operating room table onto a stretcher.  He was extubated, and transferred to PACU in satisfactory condition.  **The above dictation was created using Office managerDragon Dictation. Any errors in transcription are unintentional

## 2017-08-20 NOTE — ED Notes (Signed)
Patient transported to CT 

## 2017-08-21 ENCOUNTER — Encounter (HOSPITAL_COMMUNITY): Payer: Self-pay | Admitting: Surgery

## 2017-08-21 DIAGNOSIS — F1910 Other psychoactive substance abuse, uncomplicated: Secondary | ICD-10-CM

## 2017-08-21 LAB — BASIC METABOLIC PANEL
Anion gap: 10 (ref 5–15)
BUN: 9 mg/dL (ref 6–20)
CHLORIDE: 102 mmol/L (ref 101–111)
CO2: 25 mmol/L (ref 22–32)
CREATININE: 1.09 mg/dL (ref 0.61–1.24)
Calcium: 8.8 mg/dL — ABNORMAL LOW (ref 8.9–10.3)
GFR calc Af Amer: >60 mL/min (ref >60)
GFR calc non Af Amer: >60 mL/min (ref >60)
Glucose, Bld: 118 mg/dL — ABNORMAL HIGH (ref 65–99)
POTASSIUM: 4 mmol/L (ref 3.5–5.1)
Sodium: 137 mmol/L (ref 135–145)

## 2017-08-21 LAB — RPR, QUANT+TP ABS (REFLEX): T Pallidum Abs: POSITIVE — AB

## 2017-08-21 LAB — CBC
HEMATOCRIT: 39.3 % (ref 39.0–52.0)
Hemoglobin: 13.1 g/dL (ref 13.0–17.0)
MCH: 27 pg (ref 26.0–34.0)
MCHC: 33.3 g/dL (ref 30.0–36.0)
MCV: 81 fL (ref 78.0–100.0)
PLATELETS: 272 10*3/uL (ref 150–400)
RBC: 4.85 MIL/uL (ref 4.22–5.81)
RDW: 15.5 % (ref 11.5–15.5)
WBC: 14.1 10*3/uL — ABNORMAL HIGH (ref 4.0–10.5)

## 2017-08-21 LAB — FLUORESCENT TREPONEMAL AB(FTA)-IGG-BLD: FLUORESCENT TREPONEMAL AB, IGG: REACTIVE — AB

## 2017-08-21 LAB — T-HELPER CELLS (CD4) COUNT (NOT AT ARMC)
CD4 T CELL ABS: 130 /uL — AB (ref 400–2700)
CD4 T CELL HELPER: 13 % — AB (ref 33–55)

## 2017-08-21 LAB — HIV-1 RNA QUANT-NO REFLEX-BLD
HIV 1 RNA Quant: 2990 copies/mL
LOG10 HIV-1 RNA: 3.476 {Log_copies}/mL

## 2017-08-21 LAB — RPR: RPR: REACTIVE — AB

## 2017-08-21 MED ORDER — SULFAMETHOXAZOLE-TRIMETHOPRIM 800-160 MG PO TABS
1.0000 | ORAL_TABLET | Freq: Every day | ORAL | Status: DC
  Administered 2017-08-21 – 2017-08-22 (×2): 1 via ORAL
  Filled 2017-08-21 (×2): qty 1

## 2017-08-21 MED ORDER — ARIPIPRAZOLE 10 MG PO TABS
10.0000 mg | ORAL_TABLET | Freq: Every day | ORAL | Status: DC
  Administered 2017-08-21 – 2017-08-22 (×2): 10 mg via ORAL
  Filled 2017-08-21 (×2): qty 1

## 2017-08-21 NOTE — Progress Notes (Signed)
INTERIM PROGRESS:  CD4 130, starting TMPSMX as prophylaxis for opportunistic infection  -Dr. Parke SimmersBland

## 2017-08-21 NOTE — Discharge Summary (Addendum)
Family [facility] AMA Summary  **I was able to reach the patient on the phone the day after he left AMA and describe to him that some of his meds were at the pharmacy.  We went over the importance of surgical followup and medication compliance-Dr. Parke SimmersBland**   Patient name: Blake Chambers Medical record number: [id number] Date of birth: [birth year] Age: 27 y.o. Gender: male Date of Admission: 08/20/2017  Date of AMA: 08/22/17 Admitting Physician: Nestor RampSara L Neal, MD  Primary Care Provider: Inc, [facility] Consultants: ID  Indication for Hospitalization: surgical I/D of perianal/thigh abscesses  AMA Diagnoses/Problem List:  HIV with CD4 130 Perianal/thigh abscess s/p I/D syphillis Bipolar disorder Polysubstance use (marijuana/cocaine/tobacco)  Disposition: left AMA  AMA Condition: [facility]  AMA Exam: unable to evaluate due [facility]  Brief Hospital Course:  Patient admitted for surgical I/D of multiple perianal/ractal abscesses.  Procedure was without incident and there no significant surrounding cellulitis charted.   Plan was 2 days of IV zosyn with transition to 3 days outpatient doxy.   Patient has been non-compliant with HIV meds so CD4 was drawn at 130.   Patient was also without TOC from prior syphillis infection so they were going to be treated with TMP-SMX and penicillin.  Patient's vitals were stable throughout admission  Patient left AMA prior to   Issues for Follow Up:  1. Wound check in 2-3 post procedure with surgery 2. Was expected to be discharged with 3 days augmentin as an outpatient after at least 48hrs zosyn inpatient but left AMA before second day of zosyn. 3. With CD4 count of 130, patient started on prophylactic TMP-SMX DS.  We discussed importance of consistency with HIV meds to avoid this need. 4. Plan was to give penicillin for syphillis, patient left AMA before administration.  Significant Procedures:  abscess incision and drainage via surgery  Significant Labs and Imaging:  Recent Labs  Lab 08/20/17 0752 08/21/17 0616  WBC 11.2* 14.1*  HGB 13.2 13.1  HCT 41.4 39.3  PLT 236 272   Recent Labs  Lab 08/20/17 0752 08/21/17 0616  NA 137 137  K 4.1 4.0  CL 105 102  CO2 23 25  GLUCOSE 88 118*  BUN 9 9  CREATININE 0.92 1.09  CALCIUM 8.7* 8.8*  ALKPHOS 81  --   AST 31  --   ALT 26  --   ALBUMIN 3.2*  --     Ct Pelvis W Contrast  Result Date: 08/20/2017 CLINICAL DATA:  Buttock pain for the past week with possible perirectal abscess. EXAM: CT PELVIS WITH CONTRAST TECHNIQUE: Multidetector CT imaging of the pelvis was performed using the standard protocol following the bolus administration of intravenous contrast. CONTRAST:  75 cc ISOVUE-300 IOPAMIDOL (ISOVUE-300) INJECTION 61% COMPARISON:  None. FINDINGS: Urinary Tract:  No abnormality visualized. Bowel:  Unremarkable visualized pelvic bowel loops. Vascular/Lymphatic: Borderline enlarged bilateral inguinal lymph nodes measuring up to 1.1 cm in short axis, favored reactive. No significant vascular abnormality seen. Reproductive:  Prostate is unremarkable. Other: Mild perianal fat stranding without discrete drainable fluid collection. Musculoskeletal: Partially visualized skin thickening and soft tissue induration along the right buttock at the inferior margin of the field of view. No acute osseous abnormality. IMPRESSION: 1. Mild perianal fat stranding without discrete drainable fluid collection, although evaluation is slightly limited due to delayed phase of contrast. 2. Partially visualized skin thickening and soft tissue induration along the right buttock at the  inferior margin of the field of view. This may reflect the reported history of right buttock abscess. Correlate with physical exam. Electronically Signed   By: Obie Dredge M.D.   On: 08/20/2017 08:57    Results/Tests Pending at Time of AMA:   AMA Medications: *sent to pharmacy  as we had anticipated discharge but patient left AMA before he could be educated about full discharge meds Allergies as of 08/22/2017      Reactions   Tenofovir Disoproxil Other (See Comments)   Intolerance, increase in creatinine      Medication List    TAKE these medications   amoxicillin-clavulanate 875-125 MG tablet Commonly known as:  AUGMENTIN Take 1 tablet by mouth 2 (two) times daily for 3 days.   ARIPiprazole 10 MG tablet Commonly known as:  ABILIFY Take 1 tablet (10 mg total) by mouth daily.   darunavir-cobicistat 800-150 MG tablet Commonly known as:  PREZCOBIX Take 1 tablet by mouth daily with supper.   docusate sodium 100 MG capsule Commonly known as:  COLACE Take 1 capsule (100 mg total) by mouth daily as needed for mild constipation.   emtricitabine-tenofovir AF 200-25 MG tablet Commonly known as:  DESCOVY Take 1 tablet by mouth daily.   nicotine 21 mg/24hr patch Commonly known as:  NICODERM CQ - dosed in mg/24 hours Place 1 patch (21 mg total) onto the skin daily.   polyethylene glycol packet Commonly known as:  MIRALAX / GLYCOLAX Take 17 g by mouth daily as needed for mild constipation.   sulfamethoxazole-trimethoprim 800-160 MG tablet Commonly known as:  BACTRIM DS,SEPTRA DS Take 1 tablet by mouth daily.       AMA Instructions: Please refer to Patient Instructions section of EMR for full details.  Patient was counseled important signs and symptoms that should prompt return to medical care, changes in medications, dietary instructions, activity restrictions, and follow up appointments.   Follow-Up Appointments: Follow-up Information    Sur[facility], [facility] [place] Follow up on 09/03/2017.   Specialty:  General Surgery Why:  3:00pm, arrive by 2:30 for check in and paperwork.  please bring insurance card and photo ID Contact information: [address] [place] [address] [phone]        [facility], [facility]. Call  in 1 week(s).   Contact information: [address] [place] [address] [phone]        Gardiner Barefoot, MD .   Specialty:  Infectious Diseases Contact information: [address] [place] [address] [phone]           Marthenia Rolling, DO 08/23/2017, 6:58 AM PGY-1, [facility]

## 2017-08-21 NOTE — Progress Notes (Signed)
Family Medicine Teaching Service Daily Progress Note Intern Pager: [phone]  Patient name: Blake Chambers Medical record number: [id number] Date of birth: 1991/03/14 Age: 27 y.o. Gender: male  Primary Care Provider: [facility], [facility] Consultants: surgery/ID Code Status: full  Pt Overview and Major Events to Date:  Blake Dienererrance Rings is a 27 y.o. male presenting with perirectal abscess and abcesses of thigh . PMH is significant for HIV, syphilis, bipolar 1, tobacco/cocaine/marijuana use.  Now s/p incision/drainage in OR.  Assessment and Plan: Perianal/right thigh abscess: s/p I/D via surgery.  No significant cellulitis, not SIRS criteria -admit to med surg, Dr. Jennette KettleNeal -surgery following, appreciate rcs -zosyn for 48hrs (3/7- ), vanc if febrile -wound checks -encourage PO intake -tylenol/oxy IR for pain -miralax/colace -vitals per unit  HIV-follows with Dr. Luciana Axeomer.  Noncompliant with meds.  Last CD4 was 1040 in 2010. -restart prezcobix/descovy -ID consulted, appreciate recs -CD4/viral load ordered  Syphillis: pos RPR and FTA but unconfirmed treatment -will reorder RPR/FTA  Bipolar disorder- prescribed abilify 10mg , but stopped in january -will discuss restarting  Polysubstance use: marijuana/cocaine/tobacco.  Last used 1-2 months ago -declined nicotine patch -sw consult  FEN/GI: regular diet, protonix PPx: scds  Disposition: to homes/p 48hrs of zosyn, (send w/ 3 days augmentin)  Subjective:  Patient is well pain controlled.  Consents to discussion about medical issues in front of partner, knows how important his HIV meds are but occasionally doesn't want to take them.  States he doesn't use any drugs enough to withdraw.  Would like to be d/c'd as soon as possible.  Objective: Temp:  [97.7 F (36.5 C)-98.7 F (37.1 C)] 98.2 F (36.8 C) (03/08 0431) Pulse Rate:  [62-90] 70 (03/08 0431) Resp:  [0-21] 17 (03/08 0431) BP: (108-138)/(70-104) 123/76 (03/08  0431) SpO2:  [94 %-100 %] 100 % (03/08 0431) Weight:  [142 lb (64.4 kg)] 142 lb (64.4 kg) (03/07 1243) Physical Exam: General: pleasant, alert, conversational ENTM: Nose normal, mildly dry mucous membranes Neck: Supple, full ROM Cardiovascular: RRR, no murmurs Respiratory: CTAB, normal work of breathing Gastrointestinal: +BS, soft, non-tender, non-distended Wounds: clean/dry dressings with no visible erythema/drainage, minimally tender  MSK: Warm and well-perfused, no edema Derm: No rashes or lesions on exposed skin Neuro: Sleepy but easily awakens, answers questions appropriately, moves all extremities, no focal deficits Psych: Appropriate affect, normal behavior    Laboratory: Recent Labs  Lab 08/20/17 0752  WBC 11.2*  HGB 13.2  HCT 41.4  PLT 236   Recent Labs  Lab 08/20/17 0752  NA 137  K 4.1  CL 105  CO2 23  BUN 9  CREATININE 0.92  CALCIUM 8.7*  PROT 6.8  BILITOT 0.4  ALKPHOS 81  ALT 26  AST 31  GLUCOSE 88    Imaging/Diagnostic Tests: Ct Pelvis W Contrast  Result Date: 08/20/2017 CLINICAL DATA:  Buttock pain for the past week with possible perirectal abscess. EXAM: CT PELVIS WITH CONTRAST TECHNIQUE: Multidetector CT imaging of the pelvis was performed using the standard protocol following the bolus administration of intravenous contrast. CONTRAST:  75 cc ISOVUE-300 IOPAMIDOL (ISOVUE-300) INJECTION 61% COMPARISON:  None. FINDINGS: Urinary Tract:  No abnormality visualized. Bowel:  Unremarkable visualized pelvic bowel loops. Vascular/Lymphatic: Borderline enlarged bilateral inguinal lymph nodes measuring up to 1.1 cm in short axis, favored reactive. No significant vascular abnormality seen. Reproductive:  Prostate is unremarkable. Other: Mild perianal fat stranding without discrete drainable fluid collection. Musculoskeletal: Partially visualized skin thickening and soft tissue induration along the right buttock at the  inferior margin of the field of view. No acute  osseous abnormality. IMPRESSION: 1. Mild perianal fat stranding without discrete drainable fluid collection, although evaluation is slightly limited due to delayed phase of contrast. 2. Partially visualized skin thickening and soft tissue induration along the right buttock at the inferior margin of the field of view. This may reflect the reported history of right buttock abscess. Correlate with physical exam. Electronically Signed   By: Obie Dredge M.D.   On: 08/20/2017 08:57     Marthenia Rolling, DO 08/21/2017, 7:12 AM PGY-1, [facility] Family [facility] FPTS Intern pager: [phone], text pages welcome

## 2017-08-21 NOTE — Progress Notes (Signed)
1 Day Post-Op  Subjective: Tired today, but feels somewhat better  Objective: Vital signs in last 24 hours: Temp:  [97.7 F (36.5 C)-98.7 F (37.1 C)] 98.2 F (36.8 C) (03/08 0431) Pulse Rate:  [62-90] 70 (03/08 0431) Resp:  [0-21] 17 (03/08 0431) BP: (108-138)/(70-104) 123/76 (03/08 0431) SpO2:  [94 %-100 %] 100 % (03/08 0431) Weight:  [142 lb (64.4 kg)] 142 lb (64.4 kg) (03/07 1243)    Intake/Output from previous day: 03/07 0701 - 03/08 0700 In: 1120 [P.O.:120; I.V.:900; IV Piggyback:100] Out: 305 [Urine:300; Blood:5] Intake/Output this shift: No intake/output data recorded.  PE: Skin: perirectal wound with minimal purulent drainage.  Right gluteal and thigh wounds are clean and open.  No drainage or further induration.  Packing removed and dry dressing applied.  Lab Results:  Recent Labs    08/20/17 0752 08/21/17 0616  WBC 11.2* 14.1*  HGB 13.2 13.1  HCT 41.4 39.3  PLT 236 272   BMET Recent Labs    08/20/17 0752 08/21/17 0616  NA 137 137  K 4.1 4.0  CL 105 102  CO2 23 25  GLUCOSE 88 118*  BUN 9 9  CREATININE 0.92 1.09  CALCIUM 8.7* 8.8*   PT/INR No results for input(s): LABPROT, INR in the last 72 hours. CMP     Component Value Date/Time   NA 137 08/21/2017 0616   K 4.0 08/21/2017 0616   CL 102 08/21/2017 0616   CO2 25 08/21/2017 0616   GLUCOSE 118 (H) 08/21/2017 0616   BUN 9 08/21/2017 0616   CREATININE 1.09 08/21/2017 0616   CREATININE 1.05 06/18/2017 1223   CALCIUM 8.8 (L) 08/21/2017 0616   PROT 6.8 08/20/2017 0752   ALBUMIN 3.2 (L) 08/20/2017 0752   AST 31 08/20/2017 0752   ALT 26 08/20/2017 0752   ALKPHOS 81 08/20/2017 0752   BILITOT 0.4 08/20/2017 0752   GFRNONAA >60 08/21/2017 0616   GFRNONAA 97 06/18/2017 1223   GFRAA >60 08/21/2017 0616   GFRAA 112 06/18/2017 1223   Lipase  No results found for: LIPASE     Studies/Results: Ct Pelvis W Contrast  Result Date: 08/20/2017 CLINICAL DATA:  Buttock pain for the past week  with possible perirectal abscess. EXAM: CT PELVIS WITH CONTRAST TECHNIQUE: Multidetector CT imaging of the pelvis was performed using the standard protocol following the bolus administration of intravenous contrast. CONTRAST:  75 cc ISOVUE-300 IOPAMIDOL (ISOVUE-300) INJECTION 61% COMPARISON:  None. FINDINGS: Urinary Tract:  No abnormality visualized. Bowel:  Unremarkable visualized pelvic bowel loops. Vascular/Lymphatic: Borderline enlarged bilateral inguinal lymph nodes measuring up to 1.1 cm in short axis, favored reactive. No significant vascular abnormality seen. Reproductive:  Prostate is unremarkable. Other: Mild perianal fat stranding without discrete drainable fluid collection. Musculoskeletal: Partially visualized skin thickening and soft tissue induration along the right buttock at the inferior margin of the field of view. No acute osseous abnormality. IMPRESSION: 1. Mild perianal fat stranding without discrete drainable fluid collection, although evaluation is slightly limited due to delayed phase of contrast. 2. Partially visualized skin thickening and soft tissue induration along the right buttock at the inferior margin of the field of view. This may reflect the reported history of right buttock abscess. Correlate with physical exam. Electronically Signed   By: Obie DredgeWilliam T Derry M.D.   On: 08/20/2017 08:57    Anti-infectives: Anti-infectives (From admission, onward)   Start     Dose/Rate Route Frequency Ordered Stop   08/21/17 0800  darunavir-cobicistat (PREZCOBIX) 800-150 MG per  tablet 1 tablet     1 tablet Oral Daily with breakfast 08/20/17 1413     08/20/17 1600  emtricitabine-tenofovir AF (DESCOVY) 200-25 MG per tablet 1 tablet     1 tablet Oral Daily 08/20/17 1420     08/20/17 1400  piperacillin-tazobactam (ZOSYN) IVPB 3.375 g     3.375 g 12.5 mL/hr over 240 Minutes Intravenous Every 8 hours 08/20/17 1201         Assessment/Plan  1. Perirectal, gluteal, and thigh abscesses, POD 1  s/p I&D of all of these areas  -dressing removed.  No further packing needed.  Just dry dressing over each site.   -sitz bathes BID (discussed need to do this at home for keeping his wounds clean, or showering.  Whichever is easiest for him) -he is stable for DC home from our standpoint when ok with medicine -CX is gram + cocci.  Wounds are adequately drained, but given immunocompromised status, at least a 5 day course would likely be beneficial. -recommend stool softeners to assist with BMs -he will follow up in [facility] clinic for a wound check in 2-3 weeks.  This will be set up by our office.  FEN - regular VTE - SCDs ID - Zosyn  HIV - defer to medicine service Bipolar -  Defer to medicine   LOS: 1 day    Letha Cape , [facility] Surgery 08/21/2017, 8:28 AM Pager: [phone] Consults: [phone] Mon-Fri 7:00 am-4:30 pm Sat-Sun 7:00 am-11:30 am

## 2017-08-21 NOTE — Anesthesia Postprocedure Evaluation (Signed)
Anesthesia Post Note  Patient: Blake Chambers  Procedure(s) Performed: IRRIGATION AND DEBRIDEMENT PERI RECTAL ABSCESS (N/A Buttocks)     Patient location during evaluation: PACU Anesthesia Type: General Level of consciousness: awake and alert Pain management: pain level controlled Vital Signs Assessment: post-procedure vital signs reviewed and stable Respiratory status: spontaneous breathing, nonlabored ventilation, respiratory function stable and patient connected to nasal cannula oxygen Cardiovascular status: blood pressure returned to baseline and stable Postop Assessment: no apparent nausea or vomiting Anesthetic complications: no    Last Vitals:  Vitals:   08/20/17 2101 08/21/17 0431  BP: 119/70 123/76  Pulse: 90 70  Resp: 16 17  Temp: 37.1 C 36.8 C  SpO2: 95% 100%    Last Pain:  Vitals:   08/21/17 0431  TempSrc: Oral  PainSc:                  Blake Chambers

## 2017-08-22 MED ORDER — DOCUSATE SODIUM 100 MG PO CAPS: 100.0000 mg | ORAL_CAPSULE | Freq: Every day | ORAL | 0 refills | Status: DC | PRN

## 2017-08-22 MED ORDER — SULFAMETHOXAZOLE-TRIMETHOPRIM 800-160 MG PO TABS: 1.0000 | ORAL_TABLET | Freq: Every day | ORAL | 0 refills | Status: DC

## 2017-08-22 MED ORDER — AMOXICILLIN-POT CLAVULANATE 875-125 MG PO TABS: 1.0000 | ORAL_TABLET | Freq: Two times a day (BID) | ORAL | 0 refills | Status: AC

## 2017-08-22 MED ORDER — POLYETHYLENE GLYCOL 3350 17 G PO PACK: 17.0000 g | PACK | Freq: Every day | ORAL | 0 refills | Status: DC | PRN

## 2017-08-22 NOTE — Progress Notes (Signed)
Pt left, had taken out IV in the room as evidenced by the catheter left in the room.

## 2017-08-22 NOTE — Progress Notes (Signed)
Patient walked out of the room with a friend. He did not sign his AMA form. Security notified with the assumption that he still have his IV access but Teachers Insurance and Annuity AssociationJamie Zochol, NT found an intact IV cath hooked up to IV fluids in the room. Dr. Talbert ForestShirley notified.

## 2017-08-22 NOTE — Progress Notes (Signed)
Family Medicine Teaching Service Daily Progress Note Intern Pager: [phone]  Patient name: Blake Chambers Medical record number: [id number] Date of birth: August 14, 1990 Age: 27 y.o. Gender: male  Primary Care Provider: [facility]: surgery/ID Code Status: FULL   Pt Overview and Major Events to Date:  Blake Chambers is a 27 y.o. male presenting with perirectal abscess and abcesses of thigh . PMH is significant for HIV, syphilis, bipolar 1, tobacco/cocaine/marijuana use.  S/p incision/drainage by general surgery.    Assessment and Plan: Perianal/right thigh abscess: POD#2 s/p I/D by gen surgery.  Has remained afebrile with white count mildly elevated to 14.1. Wound site appears clean, dry and intact.  Pt states pain is well controlled, has not required Oxy IR. Per notes, ok to discharge from surgical standpoint.  -Plan to continue Zosyn for 48 hours (3/7>3/9) with last dose today 1PM.   -home wound care addressed with patient this AM  -Continue Tylenol prn at home for pain  -Miralax/colace for bowel regimen  -vitals per unit  HIV-follows with Dr. Luciana Axeomer, is noncompliant with meds.  He reports he is aware of the importance of taking these however occasionally does not want to take them.  Last CD4 was 1040 in 2010.  Current viral load of 2990, CD4 130.  -Restarted prezcobix/descovy -ID consulted, appreciate recs -In setting of low CD4 count, TMPSMX started for PCP prophylaxis  -Pt to follow up with Dr. Luciana Axeomer as outpatient -Inquire if partner on PrEP  Syphillis: pos RPR and FTA but unconfirmed treatment -Repeat RPR/FTA + -plan to treat with IM Penicillin G-2.4 million units  Bipolar disorder- prescribed abilify 10mg , but stopped in January  -will discuss restarting  Polysubstance use: marijuana/cocaine/tobacco.  Last used 1-2 months ago -declined nicotine patch -sw consult  FEN/GI: regular diet, protonix PPx: scds  Disposition: discharge home  today   Subjective:  Patient feels his pain is well controlled. He states he would like to be discharged home as soon as possible.  Partner at bedside.  Objective: Temp:  [97.8 F (36.6 C)-98.6 F (37 C)] 98.2 F (36.8 C) (03/09 1325) Pulse Rate:  [63-82] 82 (03/09 1325) Resp:  [16-18] 17 (03/09 1325) BP: (92-126)/(60-99) 117/99 (03/09 1325) SpO2:  [100 %] 100 % (03/09 1325)   Physical Exam: General:  27 year old male, NAD ENTM:  EOMI, MMM, oropharynx clear Neck: Supple, nontender Cardiovascular: RRR, no MRG Respiratory: CTAB, normal effort Gastrointestinal:  soft, non-tender, non-distended, normal bowel sounds Wounds: Mild tenderness to palpation over wound site.  Clean/dry dressings with no visible erythema/drainage MSK: Warm and well-perfused, no edema Derm: No rashes or lesions Neuro:  Alert, no focal deficits   Laboratory: Recent Labs  Lab 08/20/17 0752 08/21/17 0616  WBC 11.2* 14.1*  HGB 13.2 13.1  HCT 41.4 39.3  PLT 236 272   Recent Labs  Lab 08/20/17 0752 08/21/17 0616  NA 137 137  K 4.1 4.0  CL 105 102  CO2 23 25  BUN 9 9  CREATININE 0.92 1.09  CALCIUM 8.7* 8.8*  PROT 6.8  --   BILITOT 0.4  --   ALKPHOS 81  --   ALT 26  --   AST 31  --   GLUCOSE 88 118*    Imaging/Diagnostic Tests: Ct Pelvis W Contrast  Result Date: 08/20/2017 CLINICAL DATA:  Buttock pain for the past week with possible perirectal abscess. EXAM: CT PELVIS WITH CONTRAST TECHNIQUE: Multidetector CT imaging of the pelvis was performed using the standard protocol following  the bolus administration of intravenous contrast. CONTRAST:  75 cc ISOVUE-300 IOPAMIDOL (ISOVUE-300) INJECTION 61% COMPARISON:  None. FINDINGS: Urinary Tract:  No abnormality visualized. Bowel:  Unremarkable visualized pelvic bowel loops. Vascular/Lymphatic: Borderline enlarged bilateral inguinal lymph nodes measuring up to 1.1 cm in short axis, favored reactive. No significant vascular abnormality seen. Reproductive:   Prostate is unremarkable. Other: Mild perianal fat stranding without discrete drainable fluid collection. Musculoskeletal: Partially visualized skin thickening and soft tissue induration along the right buttock at the inferior margin of the field of view. No acute osseous abnormality. IMPRESSION: 1. Mild perianal fat stranding without discrete drainable fluid collection, although evaluation is slightly limited due to delayed phase of contrast. 2. Partially visualized skin thickening and soft tissue induration along the right buttock at the inferior margin of the field of view. This may reflect the reported history of right buttock abscess. Correlate with physical exam. Electronically Signed   By: Obie Dredge M.D.   On: 08/20/2017 08:57    Freddrick March, MD 08/22/2017, 1:43 PM PGY-2, [place] [facility] FPTS Intern pager: [phone], text pages welcome

## 2017-08-22 NOTE — Discharge Instructions (Signed)
You were admitted to the hospital and had an incision and drainage of perirectal abscesses.  You were discharged with 3 days of an antibiotic to be taken twice daily.  Please make sure you complete the entire course as directed.  You were scheduled for follow-up with the surgery office for wound check.  Below are some instructions for wound care.  Cover wounds with dry gauze and tape.  May shower or bathe to clean wounds.  Disposable Sitz Bath A disposable sitz bath is a plastic basin that fits over the toilet. A bag is hung above the toilet, and the bag is connected to a tube that opens into the basin. The bag is filled with warm water that flows into the basin through the tube. A sitz bath can be used to help relieve symptoms, clean, and promote healing in the genital and anal areas, as well as in the lower abdomen and buttocks. What are the risks? Sitz baths are generally very safe. It is possible for the skin between the genitals and the anus (perineum) to become infected, but this is rare. You can avoid this by cleaning your sitz bath supplies thoroughly. How to use a disposable sitz bath 1. Close the clamp on the tube. Make sure the clamp is closed tightly to prevent leakage. 2. Fill the sitz bath basin and the plastic bag with warm water. The water should be warm enough to be comfortable, but not hot. 3. Raise the toilet seat and place the filled basin on the toilet. Make sure the overflow opening is facing toward the back of the toilet. ? If you prefer, you may place the empty basin on the toilet first, and then use the plastic bag to fill the basin with warm water. 4. Hang the filled plastic bag overhead on a hook or towel rack close to the toilet. The bag should be higher than the toilet so that the water will flow down through the tube. 5. Attach the tube to the opening on the basin. Make sure that the tube is attached to the basin tightly to prevent leakage. 6. Sit on the basin and release  the clamp. This will allow warm water to flow into the basin and flush the area around your genitals and anus. 7. Remain sitting on the basin for about 15-20 minutes, or as long as told by your health care provider. 8. Stand up and gently pat your skin dry. If directed, apply clean bandages (dressings) to the affected area as told by your health care provider. 9. Carefully remove the basin from the toilet seat and tip the basin into the toilet to empty any remaining water. Empty any remaining water from the plastic bag into the toilet. Then, flush the toilet. 10. Wash the basin with warm water and soap. Let the basin air dry in the sink. You should also let the plastic bag and the tubing air dry. 11. Store the basin, tubing, and plastic bag in a clean, dry area. 12. Wash your hands with soap and water. If soap and water are not available, use hand sanitizer. Contact a health care provider if:  You have symptoms that get worse instead of better.  You develop new skin irritation, redness, or swelling around your genitals or anus. This information is not intended to replace advice given to you by your health care provider. Make sure you discuss any questions you have with your health care provider. Document Released: 12/02/2011 Document Revised: 11/08/2015 Document Reviewed: 04/22/2015  Chartered certified accountant Patient Education  [employer].

## 2017-08-22 NOTE — Progress Notes (Signed)
Patient walked out of the unit with a friend. AMA form not signed.  Security made aware with the assumption that he still got his IV access .  Blake Chambers NT found an intact IV cath hooked up to the IV fluid in the room.  Dr. Talbert ForestShirley notified.

## 2017-08-22 NOTE — Progress Notes (Signed)
  Received page from RN saying pt had pulled his IV and left the floor AMA.  Nursing staff and security attempted to stop him as he was leaving however unable to.  Per our conversation this morning, I had strongly advised pt to stay for last dose of IV antibiotics at 1PM in addition to 3 days of outpatient antibiotics and he was agreeable at the time.  Pt has left without discharge paperwork and therefore will not have completed recommended course of antibiotics.  Additionally, tested positive for syphilis and was to be treated with IM Penicillin G prior to discharge.   He is scheduled to follow up with surgery as outpatient in 2-3 weeks.     Freddrick MarchAmin, Tanvir Hipple, MD PGY-2, [facility]
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
## 2017-08-27 LAB — AEROBIC/ANAEROBIC CULTURE W GRAM STAIN (SURGICAL/DEEP WOUND)

## 2017-10-17 ENCOUNTER — Emergency Department (HOSPITAL_COMMUNITY)
Admission: EM | Admit: 2017-10-17 | Discharge: 2017-10-17 | Disposition: A | Discharge status: Home / Self Care | Payer: Medicaid Other | Attending: Emergency Medicine | Admitting: Emergency Medicine

## 2017-10-17 ENCOUNTER — Other Ambulatory Visit: Payer: Self-pay

## 2017-10-17 ENCOUNTER — Encounter (HOSPITAL_COMMUNITY): Payer: Self-pay | Admitting: Emergency Medicine

## 2017-10-17 DIAGNOSIS — Z5321 Procedure and treatment not carried out due to patient leaving prior to being seen by health care provider: Secondary | ICD-10-CM | POA: Insufficient documentation

## 2017-10-17 DIAGNOSIS — F329 Major depressive disorder, single episode, unspecified: Secondary | ICD-10-CM | POA: Insufficient documentation

## 2017-10-17 DIAGNOSIS — R4586 Emotional lability: Secondary | ICD-10-CM
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
## 2017-10-17 NOTE — ED Notes (Signed)
Pt denies any pain except for hunger and thirst.  Pt states he has no thoughts of harming himself or anyone else. Has not taken meds in 3 months.  Pt states "my feelings hurt".

## 2017-10-17 NOTE — ED Notes (Signed)
Patient removed his blood pressure cuff and pulse oximetry /unable to locate pt. at the department and waiting area by staff.

## 2017-10-31 ENCOUNTER — Inpatient Hospital Stay (HOSPITAL_COMMUNITY)
Admission: EM | Admit: 2017-10-31 | Discharge: 2017-11-01 | DRG: 394 | Discharge status: Against Medical Advice | Payer: Medicaid Other | Attending: Emergency Medicine | Admitting: Emergency Medicine

## 2017-10-31 ENCOUNTER — Encounter (HOSPITAL_COMMUNITY): Payer: Self-pay | Admitting: Emergency Medicine

## 2017-10-31 ENCOUNTER — Other Ambulatory Visit: Payer: Self-pay

## 2017-10-31 DIAGNOSIS — F1220 Cannabis dependence, uncomplicated: Secondary | ICD-10-CM | POA: Diagnosis present

## 2017-10-31 DIAGNOSIS — N182 Chronic kidney disease, stage 2 (mild): Secondary | ICD-10-CM | POA: Diagnosis present

## 2017-10-31 DIAGNOSIS — F17210 Nicotine dependence, cigarettes, uncomplicated: Secondary | ICD-10-CM | POA: Diagnosis present

## 2017-10-31 DIAGNOSIS — F3130 Bipolar disorder, current episode depressed, mild or moderate severity, unspecified: Secondary | ICD-10-CM | POA: Diagnosis present

## 2017-10-31 DIAGNOSIS — Z888 Allergy status to other drugs, medicaments and biological substances status: Secondary | ICD-10-CM

## 2017-10-31 DIAGNOSIS — Z9114 Patient's other noncompliance with medication regimen: Secondary | ICD-10-CM

## 2017-10-31 DIAGNOSIS — F1420 Cocaine dependence, uncomplicated: Secondary | ICD-10-CM | POA: Diagnosis present

## 2017-10-31 DIAGNOSIS — X501XXA Overexertion from prolonged static or awkward postures, initial encounter: Secondary | ICD-10-CM

## 2017-10-31 DIAGNOSIS — B20 Human immunodeficiency virus [HIV] disease: Secondary | ICD-10-CM | POA: Diagnosis present

## 2017-10-31 DIAGNOSIS — K611 Rectal abscess: Principal | ICD-10-CM | POA: Diagnosis present

## 2017-10-31 DIAGNOSIS — S63501A Unspecified sprain of right wrist, initial encounter: Secondary | ICD-10-CM
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
## 2017-11-01 ENCOUNTER — Encounter (HOSPITAL_COMMUNITY): Payer: Self-pay | Admitting: Radiology

## 2017-11-01 ENCOUNTER — Emergency Department (HOSPITAL_COMMUNITY): Payer: Medicaid Other

## 2017-11-01 ENCOUNTER — Other Ambulatory Visit: Payer: Self-pay

## 2017-11-01 DIAGNOSIS — F3130 Bipolar disorder, current episode depressed, mild or moderate severity, unspecified: Secondary | ICD-10-CM | POA: Diagnosis present

## 2017-11-01 DIAGNOSIS — K611 Rectal abscess: Secondary | ICD-10-CM | POA: Diagnosis present

## 2017-11-01 DIAGNOSIS — F17210 Nicotine dependence, cigarettes, uncomplicated: Secondary | ICD-10-CM | POA: Diagnosis present

## 2017-11-01 DIAGNOSIS — F1420 Cocaine dependence, uncomplicated: Secondary | ICD-10-CM | POA: Diagnosis present

## 2017-11-01 DIAGNOSIS — F1220 Cannabis dependence, uncomplicated: Secondary | ICD-10-CM | POA: Diagnosis present

## 2017-11-01 DIAGNOSIS — S63501A Unspecified sprain of right wrist, initial encounter: Secondary | ICD-10-CM | POA: Diagnosis present

## 2017-11-01 DIAGNOSIS — K6289 Other specified diseases of anus and rectum: Secondary | ICD-10-CM | POA: Diagnosis not present

## 2017-11-01 DIAGNOSIS — B20 Human immunodeficiency virus [HIV] disease: Secondary | ICD-10-CM | POA: Diagnosis present

## 2017-11-01 DIAGNOSIS — Z9114 Patient's other noncompliance with medication regimen: Secondary | ICD-10-CM | POA: Diagnosis not present

## 2017-11-01 DIAGNOSIS — N182 Chronic kidney disease, stage 2 (mild): Secondary | ICD-10-CM | POA: Diagnosis present

## 2017-11-01 DIAGNOSIS — X501XXA Overexertion from prolonged static or awkward postures, initial encounter: Secondary | ICD-10-CM | POA: Diagnosis not present

## 2017-11-01 DIAGNOSIS — Z888 Allergy status to other drugs, medicaments and biological substances status: Secondary | ICD-10-CM | POA: Diagnosis not present

## 2017-11-01 LAB — CBC WITH DIFFERENTIAL/PLATELET
Abs Immature Granulocytes: 0 10*3/uL (ref 0.0–0.1)
BASOS ABS: 0.1 10*3/uL (ref 0.0–0.1)
Basophils Relative: 1 %
EOS PCT: 2 %
Eosinophils Absolute: 0.2 10*3/uL (ref 0.0–0.7)
HEMATOCRIT: 46.3 % (ref 39.0–52.0)
Hemoglobin: 14.8 g/dL (ref 13.0–17.0)
Immature Granulocytes: 0 %
LYMPHS ABS: 2.7 10*3/uL (ref 0.7–4.0)
Lymphocytes Relative: 31 %
MCH: 26 pg (ref 26.0–34.0)
MCHC: 32 g/dL (ref 30.0–36.0)
MCV: 81.2 fL (ref 78.0–100.0)
MONO ABS: 0.7 10*3/uL (ref 0.1–1.0)
Monocytes Relative: 8 %
Neutro Abs: 4.9 10*3/uL (ref 1.7–7.7)
Neutrophils Relative %: 58 %
Platelets: 301 10*3/uL (ref 150–400)
RBC: 5.7 MIL/uL (ref 4.22–5.81)
RDW: 15.8 % — ABNORMAL HIGH (ref 11.5–15.5)
WBC: 8.6 10*3/uL (ref 4.0–10.5)

## 2017-11-01 LAB — URINALYSIS, ROUTINE W REFLEX MICROSCOPIC
BACTERIA UA: NONE SEEN
Glucose, UA: NEGATIVE mg/dL
Hgb urine dipstick: NEGATIVE
Ketones, ur: 5 mg/dL — AB
Leukocytes, UA: NEGATIVE
Nitrite: NEGATIVE
Protein, ur: 30 mg/dL — AB
SPECIFIC GRAVITY, URINE: 1.038 — AB (ref 1.005–1.030)
pH: 5 (ref 5.0–8.0)

## 2017-11-01 LAB — COMPREHENSIVE METABOLIC PANEL
ALBUMIN: 3.6 g/dL (ref 3.5–5.0)
ALK PHOS: 85 U/L (ref 38–126)
ALT: 22 U/L (ref 17–63)
AST: 28 U/L (ref 15–41)
Anion gap: 9 (ref 5–15)
BILIRUBIN TOTAL: 0.7 mg/dL (ref 0.3–1.2)
BUN: 13 mg/dL (ref 6–20)
CALCIUM: 8.7 mg/dL — AB (ref 8.9–10.3)
CO2: 28 mmol/L (ref 22–32)
CREATININE: 1.23 mg/dL (ref 0.61–1.24)
Chloride: 103 mmol/L (ref 101–111)
GFR calc Af Amer: >60 mL/min (ref >60)
GLUCOSE: 109 mg/dL — AB (ref 65–99)
Potassium: 4.1 mmol/L (ref 3.5–5.1)
Sodium: 140 mmol/L (ref 135–145)
TOTAL PROTEIN: 7.1 g/dL (ref 6.5–8.1)

## 2017-11-01 LAB — RAPID URINE DRUG SCREEN, HOSP PERFORMED
Amphetamines: POSITIVE — AB
BARBITURATES: NOT DETECTED
BENZODIAZEPINES: NOT DETECTED
COCAINE: NOT DETECTED
OPIATES: NOT DETECTED
Tetrahydrocannabinol: NOT DETECTED

## 2017-11-01 LAB — I-STAT CG4 LACTIC ACID, ED: LACTIC ACID, VENOUS: 1.47 mmol/L (ref 0.5–1.9)

## 2017-11-01 MED ORDER — ACETAMINOPHEN 650 MG RE SUPP: 650.0000 mg | Freq: Four times a day (QID) | RECTAL | Status: DC | PRN

## 2017-11-01 MED ORDER — SODIUM CHLORIDE 0.9 % IV BOLUS
1000.0000 mL | Freq: Once | INTRAVENOUS | Status: AC
  Administered 2017-11-01: 1000 mL via INTRAVENOUS

## 2017-11-01 MED ORDER — PIPERACILLIN-TAZOBACTAM 3.375 G IVPB 30 MIN
3.3750 g | Freq: Once | INTRAVENOUS | Status: AC
  Administered 2017-11-01: 3.375 g via INTRAVENOUS
  Filled 2017-11-01: qty 50

## 2017-11-01 MED ORDER — ACETAMINOPHEN 325 MG PO TABS: 650.0000 mg | ORAL_TABLET | Freq: Four times a day (QID) | ORAL | Status: DC | PRN

## 2017-11-01 MED ORDER — IOHEXOL 300 MG/ML  SOLN
100.0000 mL | Freq: Once | INTRAMUSCULAR | Status: AC | PRN
  Administered 2017-11-01: 100 mL via INTRAVENOUS

## 2017-11-01 MED ORDER — POLYETHYLENE GLYCOL 3350 17 G PO PACK: 17.0000 g | PACK | Freq: Every day | ORAL | Status: DC | PRN

## 2017-11-01 NOTE — ED Notes (Signed)
Resting quietly on stretcher in rgt lateral recumbent position; interm snoring; arouses with tactile stimulation - no complaints at this time ; IV fluids and abx infusing without difficulty

## 2017-11-01 NOTE — ED Notes (Signed)
Pt does not remember any details after being in the ED.  Pt reports he does not remember talking to Admitting MD or being told he was being admitted.
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
## 2017-11-01 NOTE — ED Notes (Signed)
Nurse starting IV and will draw labs. 

## 2017-11-01 NOTE — ED Notes (Signed)
Care assumed at this time; resting on stretcher in supine position with eyes closed, snoring; easily arousable - no complaints at this time; states wrist/forearm feels "better" since application of splint

## 2017-11-01 NOTE — ED Notes (Signed)
Admitting MD at bedside.

## 2017-11-01 NOTE — ED Notes (Signed)
Admitting MD requesting IVC papers.  Papers obtained from ED and given to admitting MD with instructions.
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
## 2017-11-01 NOTE — ED Notes (Signed)
Dr. Talbert Forest returned page, updated that patient has left.  Orders to discharge as elopement.

## 2017-11-01 NOTE — ED Notes (Signed)
Report called to Albin Felling, RN on [facility]

## 2017-11-01 NOTE — ED Notes (Signed)
Rounded on patient.  Pt reports he was not aware he was being admitted.  He needs to leave today d/t commitments.

## 2017-11-01 NOTE — ED Notes (Signed)
Paged admitting MD.  MD will come and talk with patient.

## 2017-11-01 NOTE — Progress Notes (Signed)
Orthopedic Tech Progress Note Patient Details:  Blake Chambers 12-30-1990 [id number]  Ortho Devices Type of Ortho Device: Velcro wrist splint Ortho Device/Splint Location: rue Ortho Device/Splint Interventions: Ordered, Application, Adjustment   Post Interventions Patient Tolerated: Well Instructions Provided: Care of device, Adjustment of device   Trinna Post 11/01/2017, 5:04 AM
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
## 2017-11-01 NOTE — ED Notes (Signed)
IVC papers completed by Dr Elveria Rising - faxed to [facility]. Verified receipt and Magistrate aware of need for Law Enforcement to serve papers to pt at home d/t pt eloped from hospital.
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
## 2017-11-01 NOTE — ED Notes (Signed)
Dr. Ottie Glazier at bedside.

## 2017-11-01 NOTE — ED Notes (Signed)
Care assumed at this time; pt resting uncomfortably - reports pain to LUE, left shoulder, pain between shoulder blades and thoracic back pain; pt currently on 2L O2/[place] for comfort measures; however, pt's RA sat 98%; pt interm hyperventilating - pt with multitude of complaints at this time

## 2017-11-01 NOTE — ED Notes (Signed)
This nurse walked into patient room.  IV and gown laying on bed. MD paged.

## 2017-11-01 NOTE — ED Notes (Signed)
Attempted report 

## 2017-11-01 NOTE — ED Notes (Signed)
Admitting team in to assess 

## 2017-11-02 LAB — GC/CHLAMYDIA PROBE AMP (~~LOC~~) NOT AT ARMC
CHLAMYDIA, DNA PROBE: POSITIVE — AB
NEISSERIA GONORRHEA: NEGATIVE

## 2017-11-16 ENCOUNTER — Telehealth: Payer: Self-pay | Admitting: *Deleted

## 2017-11-16 NOTE — Telephone Encounter (Signed)
[name] called from [employer] on [employer] behalf. He reports difficulty hearing, "thinks his ear is full of wax" and needs advice on what to do. RN advised patient should present to the urgent care for evaluation/treatment.  Patient is overdue for labs/follow up here. RN made appointments via [name]. Andree CossHowell, Murel Wigle M, RN

## 2017-11-17 ENCOUNTER — Other Ambulatory Visit: Payer: Self-pay

## 2017-11-17 ENCOUNTER — Other Ambulatory Visit: Payer: Self-pay | Admitting: *Deleted

## 2017-11-17 DIAGNOSIS — Z113 Encounter for screening for infections with a predominantly sexual mode of transmission: Secondary | ICD-10-CM

## 2017-11-17 DIAGNOSIS — B20 Human immunodeficiency virus [HIV] disease: Secondary | ICD-10-CM

## 2017-11-25 ENCOUNTER — Encounter (HOSPITAL_COMMUNITY): Payer: Self-pay | Admitting: Emergency Medicine

## 2017-11-25 ENCOUNTER — Emergency Department (HOSPITAL_COMMUNITY)
Admission: EM | Admit: 2017-11-25 | Discharge: 2017-11-26 | Disposition: A | Discharge status: Home / Self Care | Payer: Medicaid Other | Attending: Emergency Medicine | Admitting: Emergency Medicine

## 2017-11-25 DIAGNOSIS — F1210 Cannabis abuse, uncomplicated: Secondary | ICD-10-CM | POA: Insufficient documentation

## 2017-11-25 DIAGNOSIS — H66003 Acute suppurative otitis media without spontaneous rupture of ear drum, bilateral: Secondary | ICD-10-CM | POA: Diagnosis not present

## 2017-11-25 DIAGNOSIS — B20 Human immunodeficiency virus [HIV] disease: Secondary | ICD-10-CM | POA: Diagnosis not present

## 2017-11-25 DIAGNOSIS — R252 Cramp and spasm: Secondary | ICD-10-CM | POA: Diagnosis not present

## 2017-11-25 DIAGNOSIS — F17210 Nicotine dependence, cigarettes, uncomplicated: Secondary | ICD-10-CM | POA: Insufficient documentation

## 2017-11-25 DIAGNOSIS — F1410 Cocaine abuse, uncomplicated: Secondary | ICD-10-CM | POA: Insufficient documentation

## 2017-11-25 DIAGNOSIS — H9203 Otalgia, bilateral: Secondary | ICD-10-CM | POA: Diagnosis present

## 2017-11-25 MED ORDER — AMOXICILLIN-POT CLAVULANATE 875-125 MG PO TABS
1.0000 | ORAL_TABLET | Freq: Once | ORAL | Status: AC
  Administered 2017-11-26: 1 via ORAL
  Filled 2017-11-25: qty 1
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
## 2017-11-25 NOTE — ED Triage Notes (Signed)
Pt presents with multiple complaints, he states his legs gave out from walking so much, pt is homeless. Pt also reports bilateral ear pain.

## 2017-11-26 ENCOUNTER — Other Ambulatory Visit: Payer: Self-pay

## 2017-11-26 MED ORDER — AMOXICILLIN-POT CLAVULANATE 875-125 MG PO TABS: 1.0000 | ORAL_TABLET | Freq: Two times a day (BID) | ORAL | 0 refills | Status: AC

## 2017-11-26 NOTE — Discharge Instructions (Addendum)

## 2017-11-27 ENCOUNTER — Telehealth: Payer: Self-pay | Admitting: Pharmacist Clinician (PhC)/ Clinical Pharmacy Specialist

## 2017-11-27 MED FILL — AMOX-CLAV 875-125 MG TABLET: 875-125 | 10 days supply | Qty: 20 | Fill #0

## 2017-11-27 NOTE — Telephone Encounter (Signed)
Blake Chambers has a terrible hx of adherence. He has missed a ton of appts. [name] called from [employer] to see if we can see him to put him back on meds again. Last seen by Dr. Luciana Axeomer in Sept 2018. Checked today and he does have active Medicaid. Told him that we will see him next Tues for labs and possibly restarting him on ART. CD4 was 130 in March. Prob will restart him on Symtuza before the appt with Dr. Luciana Axeomer in July. All genotypes showed wild type.

## 2017-12-01 ENCOUNTER — Ambulatory Visit: Payer: Self-pay

## 2017-12-02 ENCOUNTER — Ambulatory Visit: Payer: Self-pay | Admitting: Internal Medicine

## 2017-12-14 ENCOUNTER — Encounter: Payer: Self-pay | Admitting: Internal Medicine

## 2017-12-17 ENCOUNTER — Other Ambulatory Visit: Payer: Self-pay

## 2017-12-17 ENCOUNTER — Encounter (HOSPITAL_COMMUNITY): Payer: Self-pay | Admitting: Emergency Medicine

## 2017-12-17 ENCOUNTER — Emergency Department (HOSPITAL_COMMUNITY): Payer: Medicaid Other

## 2017-12-17 ENCOUNTER — Emergency Department (HOSPITAL_COMMUNITY)
Admission: EM | Admit: 2017-12-17 | Discharge: 2017-12-17 | Disposition: A | Discharge status: Home / Self Care | Payer: Medicaid Other | Attending: Emergency Medicine | Admitting: Emergency Medicine

## 2017-12-17 DIAGNOSIS — Y929 Unspecified place or not applicable: Secondary | ICD-10-CM | POA: Insufficient documentation

## 2017-12-17 DIAGNOSIS — Y999 Unspecified external cause status: Secondary | ICD-10-CM | POA: Insufficient documentation

## 2017-12-17 DIAGNOSIS — Z23 Encounter for immunization: Secondary | ICD-10-CM | POA: Insufficient documentation

## 2017-12-17 DIAGNOSIS — S0990XA Unspecified injury of head, initial encounter: Secondary | ICD-10-CM | POA: Diagnosis not present

## 2017-12-17 DIAGNOSIS — Y939 Activity, unspecified: Secondary | ICD-10-CM | POA: Insufficient documentation

## 2017-12-17 DIAGNOSIS — F17210 Nicotine dependence, cigarettes, uncomplicated: Secondary | ICD-10-CM | POA: Insufficient documentation

## 2017-12-17 DIAGNOSIS — S51012A Laceration without foreign body of left elbow, initial encounter: Secondary | ICD-10-CM | POA: Diagnosis present

## 2017-12-17 HISTORY — DX: Syphilis, unspecified: A53.9

## 2017-12-17 LAB — CBC WITH DIFFERENTIAL/PLATELET
Abs Immature Granulocytes: 0.1 10*3/uL (ref 0.0–0.1)
Basophils Absolute: 0 10*3/uL (ref 0.0–0.1)
Basophils Relative: 0 %
EOS ABS: 0 10*3/uL (ref 0.0–0.7)
EOS PCT: 0 %
HEMATOCRIT: 42 % (ref 39.0–52.0)
HEMOGLOBIN: 13.2 g/dL (ref 13.0–17.0)
Immature Granulocytes: 0 %
LYMPHS ABS: 1.9 10*3/uL (ref 0.7–4.0)
Lymphocytes Relative: 17 %
MCH: 25.7 pg — ABNORMAL LOW (ref 26.0–34.0)
MCHC: 31.4 g/dL (ref 30.0–36.0)
MCV: 81.9 fL (ref 78.0–100.0)
MONO ABS: 1 10*3/uL (ref 0.1–1.0)
Monocytes Relative: 8 %
Neutro Abs: 8.3 10*3/uL — ABNORMAL HIGH (ref 1.7–7.7)
Neutrophils Relative %: 75 %
Platelets: 256 10*3/uL (ref 150–400)
RBC: 5.13 MIL/uL (ref 4.22–5.81)
RDW: 15.7 % — AB (ref 11.5–15.5)
WBC: 11.3 10*3/uL — AB (ref 4.0–10.5)

## 2017-12-17 LAB — COMPREHENSIVE METABOLIC PANEL
ALBUMIN: 3.8 g/dL (ref 3.5–5.0)
ALK PHOS: 60 U/L (ref 38–126)
ALT: 27 U/L (ref 0–44)
ANION GAP: 11 (ref 5–15)
AST: 42 U/L — ABNORMAL HIGH (ref 15–41)
BILIRUBIN TOTAL: 0.7 mg/dL (ref 0.3–1.2)
BUN: 14 mg/dL (ref 6–20)
CALCIUM: 8.9 mg/dL (ref 8.9–10.3)
CO2: 23 mmol/L (ref 22–32)
Chloride: 107 mmol/L (ref 98–111)
Creatinine, Ser: 1.64 mg/dL — ABNORMAL HIGH (ref 0.61–1.24)
GFR calc non Af Amer: 56 mL/min — ABNORMAL LOW (ref >60)
GLUCOSE: 89 mg/dL (ref 70–99)
POTASSIUM: 4.3 mmol/L (ref 3.5–5.1)
Sodium: 141 mmol/L (ref 135–145)
TOTAL PROTEIN: 7.1 g/dL (ref 6.5–8.1)

## 2017-12-17 LAB — CBG MONITORING, ED: Glucose-Capillary: 84 mg/dL (ref 70–99)

## 2017-12-17 LAB — ETHANOL: Alcohol, Ethyl (B): <10 mg/dL (ref <10)

## 2017-12-17 MED ORDER — LIDOCAINE-EPINEPHRINE (PF) 2 %-1:200000 IJ SOLN
10.0000 mL | Freq: Once | INTRAMUSCULAR | Status: DC
  Filled 2017-12-17: qty 20

## 2017-12-17 MED ORDER — PENICILLIN G BENZATHINE 1200000 UNIT/2ML IM SUSP
2.4000 10*6.[IU] | Freq: Once | INTRAMUSCULAR | Status: AC
  Administered 2017-12-17: 1.2 10*6.[IU] via INTRAMUSCULAR
  Filled 2017-12-17: qty 4

## 2017-12-17 MED ORDER — TETANUS-DIPHTH-ACELL PERTUSSIS 5-2.5-18.5 LF-MCG/0.5 IM SUSP
0.5000 mL | Freq: Once | INTRAMUSCULAR | Status: AC
  Administered 2017-12-17: 0.5 mL via INTRAMUSCULAR
  Filled 2017-12-17: qty 0.5
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
## 2017-12-17 NOTE — ED Notes (Signed)
Pt refusing penicillin injections-- states he is calling a friend to come and be support.
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
## 2017-12-27 ENCOUNTER — Other Ambulatory Visit: Payer: Self-pay

## 2017-12-27 ENCOUNTER — Encounter (HOSPITAL_COMMUNITY): Payer: Self-pay | Admitting: Emergency Medicine

## 2017-12-27 ENCOUNTER — Emergency Department (HOSPITAL_COMMUNITY)
Admission: EM | Admit: 2017-12-27 | Discharge: 2017-12-27 | Disposition: A | Discharge status: Home / Self Care | Payer: Medicaid Other | Attending: Emergency Medicine | Admitting: Emergency Medicine

## 2017-12-27 DIAGNOSIS — S41112D Laceration without foreign body of left upper arm, subsequent encounter: Secondary | ICD-10-CM | POA: Insufficient documentation

## 2017-12-27 DIAGNOSIS — Z79899 Other long term (current) drug therapy: Secondary | ICD-10-CM | POA: Diagnosis not present

## 2017-12-27 DIAGNOSIS — Z21 Asymptomatic human immunodeficiency virus [HIV] infection status: Secondary | ICD-10-CM | POA: Insufficient documentation

## 2017-12-27 DIAGNOSIS — F17210 Nicotine dependence, cigarettes, uncomplicated: Secondary | ICD-10-CM | POA: Diagnosis not present
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
## 2017-12-27 NOTE — Discharge Instructions (Addendum)
Return here as needed.  Keep the area clean and dry.  Follow-up with your primary doctor.  If there is any signs of infection such as redness around the wound increasing pain or drainage from the wound you will need to be rechecked.

## 2018-03-22 ENCOUNTER — Encounter (HOSPITAL_COMMUNITY): Payer: Self-pay | Admitting: Emergency Medicine

## 2018-03-22 ENCOUNTER — Emergency Department (HOSPITAL_COMMUNITY)
Admission: EM | Admit: 2018-03-22 | Discharge: 2018-03-22 | Disposition: A | Discharge status: Home / Self Care | Payer: Medicaid Other | Attending: Emergency Medicine | Admitting: Emergency Medicine

## 2018-03-22 ENCOUNTER — Other Ambulatory Visit: Payer: Self-pay

## 2018-03-22 DIAGNOSIS — Z79899 Other long term (current) drug therapy: Secondary | ICD-10-CM | POA: Insufficient documentation

## 2018-03-22 DIAGNOSIS — F17210 Nicotine dependence, cigarettes, uncomplicated: Secondary | ICD-10-CM | POA: Insufficient documentation

## 2018-03-22 DIAGNOSIS — N182 Chronic kidney disease, stage 2 (mild): Secondary | ICD-10-CM | POA: Insufficient documentation

## 2018-03-22 DIAGNOSIS — R519 Headache, unspecified: Secondary | ICD-10-CM

## 2018-03-22 DIAGNOSIS — B20 Human immunodeficiency virus [HIV] disease: Secondary | ICD-10-CM | POA: Insufficient documentation

## 2018-03-22 DIAGNOSIS — R51 Headache: Secondary | ICD-10-CM | POA: Insufficient documentation

## 2018-03-22 MED ORDER — PROCHLORPERAZINE MALEATE 5 MG PO TABS
10.0000 mg | ORAL_TABLET | Freq: Once | ORAL | Status: AC
  Administered 2018-03-22: 10 mg via ORAL
  Filled 2018-03-22: qty 2

## 2018-03-22 MED ORDER — HYDROCODONE-ACETAMINOPHEN 5-325 MG PO TABS
1.0000 | ORAL_TABLET | Freq: Once | ORAL | Status: AC
  Administered 2018-03-22: 1 via ORAL
  Filled 2018-03-22: qty 1

## 2018-03-22 NOTE — ED Notes (Signed)
Called patient X2.  °

## 2018-03-22 NOTE — ED Notes (Signed)
Pt refused pupillary assessment, all other neuro WDL

## 2018-03-22 NOTE — Discharge Instructions (Addendum)
Use Tylenol or ibuprofen as needed for pain.
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
## 2018-03-23 ENCOUNTER — Other Ambulatory Visit: Payer: Self-pay

## 2018-03-23 ENCOUNTER — Encounter (HOSPITAL_COMMUNITY): Payer: Self-pay | Admitting: Emergency Medicine

## 2018-03-23 ENCOUNTER — Emergency Department (HOSPITAL_COMMUNITY)
Admission: EM | Admit: 2018-03-23 | Discharge: 2018-03-23 | Disposition: A | Discharge status: Home / Self Care | Payer: Medicaid Other | Attending: Emergency Medicine | Admitting: Emergency Medicine

## 2018-03-23 ENCOUNTER — Emergency Department (HOSPITAL_COMMUNITY): Payer: Medicaid Other

## 2018-03-23 DIAGNOSIS — Z79899 Other long term (current) drug therapy: Secondary | ICD-10-CM | POA: Diagnosis not present

## 2018-03-23 DIAGNOSIS — F17210 Nicotine dependence, cigarettes, uncomplicated: Secondary | ICD-10-CM | POA: Insufficient documentation

## 2018-03-23 DIAGNOSIS — R51 Headache: Secondary | ICD-10-CM | POA: Diagnosis not present

## 2018-03-23 DIAGNOSIS — R519 Headache, unspecified: Secondary | ICD-10-CM

## 2018-03-23 LAB — COMPREHENSIVE METABOLIC PANEL
ALK PHOS: 79 U/L (ref 38–126)
ALT: 27 U/L (ref 0–44)
ANION GAP: 9 (ref 5–15)
AST: 25 U/L (ref 15–41)
Albumin: 3.6 g/dL (ref 3.5–5.0)
BILIRUBIN TOTAL: 0.7 mg/dL (ref 0.3–1.2)
BUN: 7 mg/dL (ref 6–20)
CALCIUM: 9.9 mg/dL (ref 8.9–10.3)
CO2: 29 mmol/L (ref 22–32)
Chloride: 98 mmol/L (ref 98–111)
Creatinine, Ser: 1.11 mg/dL (ref 0.61–1.24)
GFR calc non Af Amer: >60 mL/min (ref >60)
Glucose, Bld: 95 mg/dL (ref 70–99)
POTASSIUM: 4.5 mmol/L (ref 3.5–5.1)
Sodium: 136 mmol/L (ref 135–145)
TOTAL PROTEIN: 8.2 g/dL — AB (ref 6.5–8.1)

## 2018-03-23 LAB — CBC WITH DIFFERENTIAL/PLATELET
Abs Immature Granulocytes: 0.02 10*3/uL (ref 0.00–0.07)
BASOS ABS: 0 10*3/uL (ref 0.0–0.1)
Basophils Relative: 0 %
EOS ABS: 0 10*3/uL (ref 0.0–0.5)
EOS PCT: 0 %
HEMATOCRIT: 48.4 % (ref 39.0–52.0)
HEMOGLOBIN: 15.2 g/dL (ref 13.0–17.0)
Immature Granulocytes: 0 %
LYMPHS ABS: 2 10*3/uL (ref 0.7–4.0)
LYMPHS PCT: 28 %
MCH: 25.9 pg — AB (ref 26.0–34.0)
MCHC: 31.4 g/dL (ref 30.0–36.0)
MCV: 82.5 fL (ref 80.0–100.0)
MONO ABS: 0.5 10*3/uL (ref 0.1–1.0)
Monocytes Relative: 7 %
NRBC: 0 % (ref 0.0–0.2)
Neutro Abs: 4.6 10*3/uL (ref 1.7–7.7)
Neutrophils Relative %: 65 %
Platelets: 266 10*3/uL (ref 150–400)
RBC: 5.87 MIL/uL — AB (ref 4.22–5.81)
RDW: 15.6 % — AB (ref 11.5–15.5)
WBC: 7.3 10*3/uL (ref 4.0–10.5)

## 2018-03-23 LAB — I-STAT CHEM 8, ED
BUN: 8 mg/dL (ref 6–20)
CHLORIDE: 99 mmol/L (ref 98–111)
Calcium, Ion: 1.14 mmol/L — ABNORMAL LOW (ref 1.15–1.40)
Creatinine, Ser: 1.2 mg/dL (ref 0.61–1.24)
Glucose, Bld: 94 mg/dL (ref 70–99)
HCT: 49 % (ref 39.0–52.0)
HEMOGLOBIN: 16.7 g/dL (ref 13.0–17.0)
POTASSIUM: 4.5 mmol/L (ref 3.5–5.1)
Sodium: 135 mmol/L (ref 135–145)
TCO2: 27 mmol/L (ref 22–32)

## 2018-03-23 MED ORDER — SODIUM CHLORIDE 0.9 % IV SOLN
INTRAVENOUS | Status: DC
  Administered 2018-03-23: 10:00:00 via INTRAVENOUS

## 2018-03-23 MED ORDER — LIDOCAINE-EPINEPHRINE 1 %-1:100000 IJ SOLN
10.0000 mL | Freq: Once | INTRAMUSCULAR | Status: DC
  Filled 2018-03-23 (×2): qty 10

## 2018-03-23 MED ORDER — PROCHLORPERAZINE EDISYLATE 10 MG/2ML IJ SOLN
10.0000 mg | Freq: Once | INTRAMUSCULAR | Status: AC
  Administered 2018-03-23: 10 mg via INTRAVENOUS
  Filled 2018-03-23: qty 2

## 2018-03-23 MED ORDER — DIPHENHYDRAMINE HCL 50 MG/ML IJ SOLN
25.0000 mg | Freq: Once | INTRAMUSCULAR | Status: AC
  Administered 2018-03-23: 25 mg via INTRAVENOUS
  Filled 2018-03-23: qty 1

## 2018-03-23 MED ORDER — LIDOCAINE HCL (PF) 1 % IJ SOLN
INTRAMUSCULAR | Status: AC
  Filled 2018-03-23: qty 30

## 2018-03-23 NOTE — ED Notes (Signed)
ED Provider at bedside. 

## 2018-03-23 NOTE — ED Notes (Signed)
Patient transported to CT 

## 2018-03-23 NOTE — ED Notes (Signed)
This tech went to go get pt something to eat and drink while the other tech, Kory, went to ambulate the pt per EDP, Dr. Hyacinth Meeker, request. Pt was not found in his room or in any of the bathrooms.

## 2018-03-23 NOTE — ED Notes (Signed)
RN went to give discharge teaching to Pt.  Pt was not in the room and could not be found.
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
## 2018-03-23 NOTE — ED Notes (Signed)
Pt's partner stated to this tech that Pt expressed that his headache was gone and Pt wanted to see the doctor and leave. Pt's partner also stated that Pt was considering leaving relatively soon.

## 2018-03-23 NOTE — ED Triage Notes (Addendum)
Pt presents to the ED by [facility] with c/o headache since yesterday morning. Denies vision changes but has photosensitivity. Pt is alert and oriented but is not cooperative per EMS.   VSS 116/62 60 P 18 resp

## 2018-03-23 NOTE — ED Notes (Signed)
GOT PATIENT ON THE MONITOR PATIENT IS RESTING WITH CALL BELL IN REACH AND FAMILY AT BEDSIDE 

## 2018-08-24 ENCOUNTER — Other Ambulatory Visit: Payer: Medicaid Other

## 2018-08-24 DIAGNOSIS — Z113 Encounter for screening for infections with a predominantly sexual mode of transmission: Secondary | ICD-10-CM

## 2018-08-24 DIAGNOSIS — B20 Human immunodeficiency virus [HIV] disease: Secondary | ICD-10-CM

## 2018-08-25 LAB — T-HELPER CELL (CD4) - (RCID CLINIC ONLY)
CD4 % Helper T Cell: 30 % — ABNORMAL LOW (ref 33–55)
CD4 T Cell Abs: 730 /uL (ref 400–2700)

## 2018-08-27 LAB — FLUORESCENT TREPONEMAL AB(FTA)-IGG-BLD: Fluorescent Treponemal ABS: REACTIVE — AB

## 2018-08-27 LAB — HIV-1 RNA QUANT-NO REFLEX-BLD
HIV 1 RNA Quant: 3000 copies/mL — ABNORMAL HIGH
HIV-1 RNA Quant, Log: 3.48 Log copies/mL — ABNORMAL HIGH

## 2018-08-27 LAB — RPR TITER: RPR Titer: 1:8 {titer} — ABNORMAL HIGH

## 2018-08-27 LAB — RPR: RPR: REACTIVE — AB

## 2018-09-07 ENCOUNTER — Encounter: Payer: Medicaid Other | Admitting: Internal Medicine

## 2018-09-15 ENCOUNTER — Encounter: Payer: Medicaid Other | Admitting: Internal Medicine

## 2018-09-27 ENCOUNTER — Ambulatory Visit: Payer: Medicaid Other | Admitting: Internal Medicine

## 2018-10-13 ENCOUNTER — Ambulatory Visit: Payer: Medicaid Other | Admitting: Pharmacist

## 2018-10-26 ENCOUNTER — Other Ambulatory Visit: Payer: Self-pay | Admitting: Internal Medicine

## 2018-10-26 DIAGNOSIS — B20 Human immunodeficiency virus [HIV] disease: Secondary | ICD-10-CM

## 2018-11-11 ENCOUNTER — Ambulatory Visit: Payer: Medicaid Other | Admitting: Pharmacist

## 2018-11-11 ENCOUNTER — Telehealth: Payer: Self-pay | Admitting: Pharmacy Technician

## 2018-11-11 NOTE — Telephone Encounter (Signed)
[facility] Patient Advocate Encounter    Findings of the benefits investigation conducted this morning via test claims for the patient's upcoming appointment on 11/11/18 are as follows:   Insurance: [employer], active Test run with drug historically used, will run another test claim if new drug prescribed Estimated copay amount: $3.00 Prior Authorization: not required at this time  [facility] Patient Advocate will follow up once patient arrives for their appointment.  Beulah Gandy, CPhT Specialty Pharmacy Patient [facility] for Infectious Disease Phone: [phone] Fax: [phone] 11/11/2018 8:48 AM

## 2018-12-02 ENCOUNTER — Ambulatory Visit: Payer: Medicaid Other | Admitting: Pharmacist

## 2018-12-02 NOTE — Telephone Encounter (Signed)
[facility] Patient Teacher, English as a foreign language completed.    The patient is still insured through [employer] and has a $3 copay.   Blake Chambers. [name] Patient [facility] for Infectious Disease Phone: [phone] Fax:  [phone]

## 2018-12-06 ENCOUNTER — Ambulatory Visit: Payer: Medicaid Other | Admitting: Pharmacist

## 2018-12-08 ENCOUNTER — Emergency Department (HOSPITAL_COMMUNITY): Payer: Medicaid Other

## 2018-12-08 ENCOUNTER — Other Ambulatory Visit: Payer: Self-pay

## 2018-12-08 ENCOUNTER — Emergency Department (HOSPITAL_COMMUNITY)
Admission: EM | Admit: 2018-12-08 | Discharge: 2018-12-08 | Disposition: A | Discharge status: Home / Self Care | Payer: Medicaid Other | Attending: Emergency Medicine | Admitting: Emergency Medicine

## 2018-12-08 DIAGNOSIS — B20 Human immunodeficiency virus [HIV] disease: Secondary | ICD-10-CM | POA: Diagnosis not present

## 2018-12-08 DIAGNOSIS — Y929 Unspecified place or not applicable: Secondary | ICD-10-CM | POA: Diagnosis not present

## 2018-12-08 DIAGNOSIS — S0990XA Unspecified injury of head, initial encounter: Secondary | ICD-10-CM

## 2018-12-08 DIAGNOSIS — Z79899 Other long term (current) drug therapy: Secondary | ICD-10-CM | POA: Insufficient documentation

## 2018-12-08 DIAGNOSIS — F17210 Nicotine dependence, cigarettes, uncomplicated: Secondary | ICD-10-CM | POA: Diagnosis not present

## 2018-12-08 DIAGNOSIS — S02832A Fracture of medial orbital wall, left side, initial encounter for closed fracture: Secondary | ICD-10-CM | POA: Insufficient documentation

## 2018-12-08 DIAGNOSIS — Y999 Unspecified external cause status: Secondary | ICD-10-CM | POA: Diagnosis not present

## 2018-12-08 DIAGNOSIS — S0285XA Fracture of orbit, unspecified, initial encounter for closed fracture: Secondary | ICD-10-CM

## 2018-12-08 DIAGNOSIS — Y939 Activity, unspecified: Secondary | ICD-10-CM | POA: Insufficient documentation

## 2018-12-08 LAB — COMPREHENSIVE METABOLIC PANEL
ALT: 22 U/L (ref 0–44)
AST: 38 U/L (ref 15–41)
Albumin: 3.4 g/dL — ABNORMAL LOW (ref 3.5–5.0)
Alkaline Phosphatase: 71 U/L (ref 38–126)
Anion gap: 11 (ref 5–15)
BUN: 11 mg/dL (ref 6–20)
CO2: 21 mmol/L — ABNORMAL LOW (ref 22–32)
Calcium: 8.6 mg/dL — ABNORMAL LOW (ref 8.9–10.3)
Chloride: 104 mmol/L (ref 98–111)
Creatinine, Ser: 1.17 mg/dL (ref 0.61–1.24)
GFR calc Af Amer: >60 mL/min (ref >60)
GFR calc non Af Amer: >60 mL/min (ref >60)
Glucose, Bld: 107 mg/dL — ABNORMAL HIGH (ref 70–99)
Potassium: 4.2 mmol/L (ref 3.5–5.1)
Sodium: 136 mmol/L (ref 135–145)
Total Bilirubin: 0.6 mg/dL (ref 0.3–1.2)
Total Protein: 8.1 g/dL (ref 6.5–8.1)

## 2018-12-08 LAB — CBC WITH DIFFERENTIAL/PLATELET
Abs Immature Granulocytes: 0.07 10*3/uL (ref 0.00–0.07)
Basophils Absolute: 0 10*3/uL (ref 0.0–0.1)
Basophils Relative: 0 %
Eosinophils Absolute: 0.1 10*3/uL (ref 0.0–0.5)
Eosinophils Relative: 1 %
HCT: 44.2 % (ref 39.0–52.0)
Hemoglobin: 13.7 g/dL (ref 13.0–17.0)
Immature Granulocytes: 1 %
Lymphocytes Relative: 23 %
Lymphs Abs: 2.3 10*3/uL (ref 0.7–4.0)
MCH: 26.3 pg (ref 26.0–34.0)
MCHC: 31 g/dL (ref 30.0–36.0)
MCV: 84.8 fL (ref 80.0–100.0)
Monocytes Absolute: 0.9 10*3/uL (ref 0.1–1.0)
Monocytes Relative: 9 %
Neutro Abs: 6.8 10*3/uL (ref 1.7–7.7)
Neutrophils Relative %: 66 %
Platelets: 319 10*3/uL (ref 150–400)
RBC: 5.21 MIL/uL (ref 4.22–5.81)
RDW: 15.8 % — ABNORMAL HIGH (ref 11.5–15.5)
WBC: 10.1 10*3/uL (ref 4.0–10.5)
nRBC: 0 % (ref 0.0–0.2)

## 2018-12-08 LAB — ACETAMINOPHEN LEVEL: Acetaminophen (Tylenol), Serum: <10 ug/mL — ABNORMAL LOW (ref 10–30)

## 2018-12-08 LAB — SALICYLATE LEVEL: Salicylate Lvl: <7 mg/dL (ref 2.8–30.0)

## 2018-12-08 LAB — ETHANOL: Alcohol, Ethyl (B): <10 mg/dL (ref <10)

## 2018-12-08 MED ORDER — ERYTHROMYCIN 5 MG/GM OP OINT: TOPICAL_OINTMENT | OPHTHALMIC | 0 refills | Status: DC

## 2018-12-08 MED ORDER — SODIUM CHLORIDE 0.9 % IV BOLUS
1000.0000 mL | Freq: Once | INTRAVENOUS | Status: AC
  Administered 2018-12-08: 1000 mL via INTRAVENOUS

## 2018-12-08 NOTE — ED Notes (Signed)
RN attempted IV start x2 without success. Bloodwork obtained

## 2018-12-08 NOTE — Discharge Instructions (Signed)
Please go immediately to Dr. Talbert Forest with ophthalmology's office, he would like to see you before his office closes this evening so that he can do a complete eye exam.  [address] [place] [address] [phone]  You were examined today for a head injury and possible concussion.  Sometimes serious problems can develop after a head injury. Please return to the emergency department if you experience any of the following symptoms: Repeated vomiting Headache that gets worse and does not go away Loss of consciousness or inability to stay awake at times when you normally would be able to Getting more confused, restless or agitated Convulsions or seizures Difficulty walking or feeling off balance Weakness or numbness Vision changes
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
## 2019-01-26 ENCOUNTER — Ambulatory Visit: Payer: Medicaid Other | Admitting: Pharmacist

## 2019-05-05 ENCOUNTER — Ambulatory Visit: Payer: Medicaid Other | Admitting: Pharmacist

## 2019-05-13 ENCOUNTER — Encounter (HOSPITAL_COMMUNITY): Payer: Self-pay | Admitting: Emergency Medicine

## 2019-05-13 ENCOUNTER — Other Ambulatory Visit: Payer: Self-pay

## 2019-05-13 ENCOUNTER — Emergency Department (HOSPITAL_COMMUNITY)
Admission: EM | Admit: 2019-05-13 | Discharge: 2019-05-13 | Disposition: A | Discharge status: Home / Self Care | Payer: Medicaid Other | Attending: Emergency Medicine | Admitting: Emergency Medicine

## 2019-05-13 DIAGNOSIS — M79671 Pain in right foot: Secondary | ICD-10-CM | POA: Diagnosis present

## 2019-05-13 DIAGNOSIS — F1410 Cocaine abuse, uncomplicated: Secondary | ICD-10-CM | POA: Diagnosis not present

## 2019-05-13 DIAGNOSIS — B353 Tinea pedis: Secondary | ICD-10-CM | POA: Diagnosis not present

## 2019-05-13 DIAGNOSIS — F1210 Cannabis abuse, uncomplicated: Secondary | ICD-10-CM | POA: Insufficient documentation

## 2019-05-13 DIAGNOSIS — B20 Human immunodeficiency virus [HIV] disease: Secondary | ICD-10-CM | POA: Diagnosis not present

## 2019-05-13 DIAGNOSIS — F17210 Nicotine dependence, cigarettes, uncomplicated: Secondary | ICD-10-CM | POA: Diagnosis not present

## 2019-05-13 NOTE — Discharge Instructions (Signed)
Soak your feet twice a day for 20 minutes, Dry wll and apply athletes foot cream.  Wear dry soaks.  You need to be seen in the Infectious disease clinic to get on appropriate medications and to have lab testing.

## 2019-05-13 NOTE — Progress Notes (Signed)
Consult request has been received. CSW attempting to follow up at present time  Jalal Rauch M. Scout Gumbs LCSWA Transitions of Care  Clinical Social Worker  Ph: [phone] 

## 2019-05-13 NOTE — ED Provider Notes (Signed)
[name] EMERGENCY DEPARTMENT Provider Note   CSN: [id number] Arrival date & time: 05/13/19  1528     History   Chief Complaint Chief Complaint  Patient presents with  . Groin Pain  . Foot Pain    HPI Parthiv Mucci is a 28 y.o. male.     Patient complains of a cut on his foot between his toes patient states that he thinks it is he is syphilis coming back.  Patient has a history of HIV syphilis and bipolar disorder.  It appears patient has not been keeping his follow-up appointments with the infectious disease clinic and he is not currently on any medications.  The history is provided by the patient and a friend.  Foot Pain    Past Medical History:  Diagnosis Date  . Anxiety   . Bipolar affective ([facility])   . HIV (human immunodeficiency virus infection) ([facility])   . Immune deficiency disorder ([facility])   . Mental health disorder   . Syphilis     Patient Active Problem List   Diagnosis Date Noted  . Perirectal cellulitis 11/01/2017  . Abscess 08/20/2017  . Perirectal abscess 08/20/2017  . Encounter for long-term (current) use of high-risk medication 02/24/2017  . Screening examination for venereal disease 10/21/2016  . Cocaine use disorder, moderate, dependence ([facility]CC) 10/23/2014  . Cannabis use disorder, severe, dependence ([facility]) 10/23/2014  . Bipolar I disorder, severe, current or most recent episode depressed, with mixed features ([facility]) 10/21/2014  . Frostbite of both hands 08/01/2014  . Tobacco use disorder 07/18/2013  . Chronic renal insufficiency, stage II (mild) 11/30/2012  . HIV disease ([facility]) 02/13/2011  . Syphilis 02/13/2011  . Bipolar I disorder ([facility]) 02/10/2011    Past Surgical History:  Procedure Laterality Date  . IRRIGATION AND DEBRIDEMENT ABSCESS N/A 08/20/2017   Procedure: IRRIGATION AND DEBRIDEMENT PERI RECTAL ABSCESS;  Surgeon: Andria Meuse, MD;  Location: [facility] OR;  Service: General;  Laterality: N/A;        Home  Medications    Prior to Admission medications   Medication Sig Start Date End Date Taking? Authorizing Provider  darunavir-cobicistat (PREZCOBIX) 800-150 MG tablet Take 1 tablet by mouth daily with supper. 08/26/16   Gardiner Barefoot, MD  emtricitabine-tenofovir AF (DESCOVY) 200-25 MG tablet Take 1 tablet by mouth daily. 08/26/16   ComerBelia Heman, MD  erythromycin ophthalmic ointment Place a 1/2 inch ribbon of ointment into the lower eyelid. 12/08/18   Dartha Lodge, PA-C  ibuprofen (ADVIL,MOTRIN) 200 MG tablet Take 400 mg by mouth every 6 (six) hours as needed for headache or mild pain.    [provider]    Family History History reviewed. No pertinent family history.  Social History Social History   Tobacco Use  . Smoking status: Current Every Day Smoker    Packs/day: 0.30    Types: Cigarettes  . Smokeless tobacco: Never Used  . Tobacco comment: pt. trying to quit  Substance Use Topics  . Alcohol use: No    Alcohol/week: 1.0 standard drinks    Types: 1 Standard drinks or equivalent per week    Comment: occ  . Drug use: No    Frequency: 1.0 times per week    Types: Marijuana, Cocaine     Allergies   Tenofovir disoproxil   Review of Systems Review of Systems  All other systems reviewed and are negative.    Physical Exam Updated Vital Signs BP 110/79 (BP Location: Right Arm)   Pulse Marland Kitchen)  106   Temp 98.8 F (37.1 C) (Oral)   Resp 16   SpO2 99%   Physical Exam Vitals signs and nursing note reviewed.  Constitutional:      Appearance: He is well-developed.  HENT:     Head: Normocephalic.  Neck:     Musculoskeletal: Normal range of motion.  Cardiovascular:     Rate and Rhythm: Normal rate.  Pulmonary:     Effort: Pulmonary effort is normal.  Abdominal:     General: There is no distension.  Musculoskeletal: Normal range of motion.  Skin:    General: Skin is warm.     Comments: Cracked skin between toes looks like athlete's foot  Neurological:      Mental Status: He is alert and oriented to person, place, and time.  Psychiatric:        Mood and Affect: Mood normal.      ED Treatments / Results  Labs (all labs ordered are listed, but only abnormal results are displayed) Labs Reviewed - No data to display  EKG None  Radiology No results found.  Procedures Procedures (including critical care time)  Medications Ordered in ED Medications - No data to display   Initial Impression / Assessment and Plan / ED Course  I have reviewed the triage vital signs and the nursing notes.  Pertinent labs & imaging results that were available during my care of the patient were reviewed by me and considered in my medical decision making (see chart for details).        Patient advised he needs to schedule an appointment in the infectious disease clinic for evaluation.  I will consult social work to see if there are any community resources to help patient as his psychiatric illness seems to inhibit his ability to get appropriate medical treatment. I will order labs  Final Clinical Impressions(s) / ED Diagnoses   Final diagnoses:  Athlete's foot, right  HIV infection, unspecified symptom status [facility])    ED Discharge Orders    None    An After Visit Summary was printed and given to the patient.   Fransico Meadow, [place] 05/13/19 1731    Blanchie Dessert, MD 05/13/19 1956

## 2019-05-13 NOTE — ED Triage Notes (Signed)
Per [facility] pt called for finger lac- but asked pt and he says he called for groin pain that started 2-3 weeks ago. He thinks its syphilis from previous experience. Pt also says R foot pain with no known injury.

## 2019-05-16 LAB — HIV-1 RNA QUANT-NO REFLEX-BLD: HIV 1 RNA Quant: UNDETERMINED copies/mL

## 2019-05-19 ENCOUNTER — Other Ambulatory Visit: Payer: Self-pay

## 2019-05-19 ENCOUNTER — Encounter (HOSPITAL_COMMUNITY): Payer: Self-pay | Admitting: Student

## 2019-05-19 ENCOUNTER — Emergency Department (HOSPITAL_COMMUNITY): Payer: Medicaid Other

## 2019-05-19 ENCOUNTER — Emergency Department (HOSPITAL_COMMUNITY)
Admission: EM | Admit: 2019-05-19 | Discharge: 2019-05-19 | Disposition: A | Discharge status: Home / Self Care | Payer: Medicaid Other | Attending: Emergency Medicine | Admitting: Emergency Medicine

## 2019-05-19 DIAGNOSIS — R1031 Right lower quadrant pain: Secondary | ICD-10-CM | POA: Diagnosis not present

## 2019-05-19 DIAGNOSIS — F17210 Nicotine dependence, cigarettes, uncomplicated: Secondary | ICD-10-CM | POA: Diagnosis not present

## 2019-05-19 DIAGNOSIS — R1030 Lower abdominal pain, unspecified: Secondary | ICD-10-CM

## 2019-05-19 DIAGNOSIS — Z21 Asymptomatic human immunodeficiency virus [HIV] infection status: Secondary | ICD-10-CM | POA: Insufficient documentation

## 2019-05-19 DIAGNOSIS — Z79899 Other long term (current) drug therapy: Secondary | ICD-10-CM | POA: Diagnosis not present

## 2019-05-19 DIAGNOSIS — R1032 Left lower quadrant pain: Secondary | ICD-10-CM | POA: Insufficient documentation

## 2019-05-19 LAB — CBC WITH DIFFERENTIAL/PLATELET
Abs Immature Granulocytes: 0.07 10*3/uL (ref 0.00–0.07)
Basophils Absolute: 0 10*3/uL (ref 0.0–0.1)
Basophils Relative: 0 %
Eosinophils Absolute: 0.1 10*3/uL (ref 0.0–0.5)
Eosinophils Relative: 1 %
HCT: 45.4 % (ref 39.0–52.0)
Hemoglobin: 14.2 g/dL (ref 13.0–17.0)
Immature Granulocytes: 1 %
Lymphocytes Relative: 21 %
Lymphs Abs: 1.8 10*3/uL (ref 0.7–4.0)
MCH: 25.9 pg — ABNORMAL LOW (ref 26.0–34.0)
MCHC: 31.3 g/dL (ref 30.0–36.0)
MCV: 82.8 fL (ref 80.0–100.0)
Monocytes Absolute: 1 10*3/uL (ref 0.1–1.0)
Monocytes Relative: 11 %
Neutro Abs: 5.7 10*3/uL (ref 1.7–7.7)
Neutrophils Relative %: 66 %
Platelets: 221 10*3/uL (ref 150–400)
RBC: 5.48 MIL/uL (ref 4.22–5.81)
RDW: 15.8 % — ABNORMAL HIGH (ref 11.5–15.5)
WBC: 8.7 10*3/uL (ref 4.0–10.5)
nRBC: 0 % (ref 0.0–0.2)

## 2019-05-19 LAB — COMPREHENSIVE METABOLIC PANEL
ALT: 21 U/L (ref 0–44)
AST: 24 U/L (ref 15–41)
Albumin: 3.1 g/dL — ABNORMAL LOW (ref 3.5–5.0)
Alkaline Phosphatase: 69 U/L (ref 38–126)
Anion gap: 10 (ref 5–15)
BUN: 11 mg/dL (ref 6–20)
CO2: 21 mmol/L — ABNORMAL LOW (ref 22–32)
Calcium: 8.6 mg/dL — ABNORMAL LOW (ref 8.9–10.3)
Chloride: 102 mmol/L (ref 98–111)
Creatinine, Ser: 1.14 mg/dL (ref 0.61–1.24)
GFR calc Af Amer: >60 mL/min (ref >60)
GFR calc non Af Amer: >60 mL/min (ref >60)
Glucose, Bld: 93 mg/dL (ref 70–99)
Potassium: 3.4 mmol/L — ABNORMAL LOW (ref 3.5–5.1)
Sodium: 133 mmol/L — ABNORMAL LOW (ref 135–145)
Total Bilirubin: 0.6 mg/dL (ref 0.3–1.2)
Total Protein: 7.6 g/dL (ref 6.5–8.1)

## 2019-05-19 LAB — URINALYSIS, ROUTINE W REFLEX MICROSCOPIC
Bilirubin Urine: NEGATIVE
Glucose, UA: NEGATIVE mg/dL
Hgb urine dipstick: NEGATIVE
Ketones, ur: NEGATIVE mg/dL
Leukocytes,Ua: NEGATIVE
Nitrite: NEGATIVE
Protein, ur: NEGATIVE mg/dL
Specific Gravity, Urine: 1.033 — ABNORMAL HIGH (ref 1.005–1.030)
pH: 5 (ref 5.0–8.0)

## 2019-05-19 LAB — LIPASE, BLOOD: Lipase: 18 U/L (ref 11–51)

## 2019-05-19 MED ORDER — DICYCLOMINE HCL 20 MG PO TABS: 20.0000 mg | ORAL_TABLET | Freq: Three times a day (TID) | ORAL | 0 refills | Status: DC | PRN

## 2019-05-19 MED ORDER — ONDANSETRON HCL 4 MG/2ML IJ SOLN
4.0000 mg | Freq: Once | INTRAMUSCULAR | Status: AC
  Administered 2019-05-19: 4 mg via INTRAVENOUS
  Filled 2019-05-19: qty 2

## 2019-05-19 MED ORDER — POTASSIUM CHLORIDE CRYS ER 20 MEQ PO TBCR
20.0000 meq | EXTENDED_RELEASE_TABLET | Freq: Once | ORAL | Status: AC
  Administered 2019-05-19: 20 meq via ORAL
  Filled 2019-05-19: qty 1

## 2019-05-19 MED ORDER — MORPHINE SULFATE (PF) 4 MG/ML IV SOLN
4.0000 mg | Freq: Once | INTRAVENOUS | Status: AC
  Administered 2019-05-19: 4 mg via INTRAVENOUS
  Filled 2019-05-19: qty 1

## 2019-05-19 MED ORDER — DICYCLOMINE HCL 10 MG PO CAPS
10.0000 mg | ORAL_CAPSULE | Freq: Once | ORAL | Status: AC
  Administered 2019-05-19: 10 mg via ORAL
  Filled 2019-05-19: qty 1

## 2019-05-19 MED ORDER — SODIUM CHLORIDE 0.9 % IV BOLUS
1000.0000 mL | Freq: Once | INTRAVENOUS | Status: AC
  Administered 2019-05-19: 10:00:00 1000 mL via INTRAVENOUS

## 2019-05-19 MED ORDER — IOHEXOL 300 MG/ML  SOLN
100.0000 mL | Freq: Once | INTRAMUSCULAR | Status: AC | PRN
  Administered 2019-05-19: 100 mL via INTRAVENOUS

## 2019-05-19 MED ORDER — ONDANSETRON 4 MG PO TBDP: 4.0000 mg | ORAL_TABLET | Freq: Three times a day (TID) | ORAL | 0 refills | Status: DC | PRN

## 2019-05-19 NOTE — Discharge Instructions (Addendum)
You were seen in the emergency department today for abdominal pain and diarrhea.  Your work-up was overall reassuring.  Your CT scan did not show any significant abnormalities.  Your labs show that you had some mild electrolyte abnormalities including low potassium, please see attached diet guidelines.  We have also provided diet guidelines to help with diarrhea.  We are sending you home with Bentyl to take every 8 hours as needed for abdominal cramping as well as Zofran to take every 8 hours as needed for nausea and vomiting.  Please follow-up with your primary care provider as well as your infectious disease doctor within 3 days.  Return to the ER for new or worsening symptoms including but not limited to worsening pain, fever, inability to keep fluids down, blood in your stool, blood in vomit, mucousy stools, or any other concerns.

## 2019-05-19 NOTE — ED Provider Notes (Signed)
[name] EMERGENCY DEPARTMENT Provider Note   CSN: [id number] Arrival date & time: 05/19/19  7672     History   Chief Complaint Chief Complaint  Patient presents with   Abdominal Pain    HPI Blake Chambers is a 28 y.o. male with a history of HIV with last quant undetectable 05/13/19 & last CD4 count 730 08/24/18, prior perirectal abscess, syphilis, anxiety, bipolar disorder, CKD stage II, & polysubstance abuse who presents to the ED via [facility] with complaints of abdominal pain x 2 days. Patient states pain is a cramping discomfort located in the lower abdomen that is currently an 8/10 in severity, alleviated some with BM, no specific aggravating factors. He has had a total of 4 associated episodes of diarrhea as well as nausea, no melena/hematochezia/pale stools, mostly loose/brown stools. No intervention PTA. Denies fever, URI sxs, chest pain, dyspnea, dysuria, testicular pain/swelling, or vomiting. Denies recent foreign travel, abx use, hospitalizations, or history of suspicious PO intake.      HPI  Past Medical History:  Diagnosis Date   Anxiety    Bipolar affective (HCC)    HIV (human immunodeficiency virus infection) (HCC)    Immune deficiency disorder (HCC)    Mental health disorder    Syphilis     Patient Active Problem List   Diagnosis Date Noted   Perirectal cellulitis 11/01/2017   Abscess 08/20/2017   Perirectal abscess 08/20/2017   Encounter for long-term (current) use of high-risk medication 02/24/2017   Screening examination for venereal disease 10/21/2016   Cocaine use disorder, moderate, dependence (HCC) 10/23/2014   Cannabis use disorder, severe, dependence (HCC) 10/23/2014   Bipolar I disorder, severe, current or most recent episode depressed, with mixed features (HCC) 10/21/2014   Frostbite of both hands 08/01/2014   Tobacco use disorder 07/18/2013   Chronic renal insufficiency, stage II (mild) 11/30/2012   HIV  disease (HCC) 02/13/2011   Syphilis 02/13/2011   Bipolar I disorder (HCC) 02/10/2011    Past Surgical History:  Procedure Laterality Date   IRRIGATION AND DEBRIDEMENT ABSCESS N/A 08/20/2017   Procedure: IRRIGATION AND DEBRIDEMENT PERI RECTAL ABSCESS;  Surgeon: Andria Meuse, MD;  Location: [facility] OR;  Service: General;  Laterality: N/A;        Home Medications    Prior to Admission medications   Medication Sig Start Date End Date Taking? Authorizing Provider  darunavir-cobicistat (PREZCOBIX) 800-150 MG tablet Take 1 tablet by mouth daily with supper. 08/26/16   Gardiner Barefoot, MD  emtricitabine-tenofovir AF (DESCOVY) 200-25 MG tablet Take 1 tablet by mouth daily. 08/26/16   ComerBelia Heman, MD  erythromycin ophthalmic ointment Place a 1/2 inch ribbon of ointment into the lower eyelid. 12/08/18   Dartha Lodge, PA-C  ibuprofen (ADVIL,MOTRIN) 200 MG tablet Take 400 mg by mouth every 6 (six) hours as needed for headache or mild pain.    [provider]    Family History No family history on file.  Social History Social History   Tobacco Use   Smoking status: Current Every Day Smoker    Packs/day: 0.30    Types: Cigarettes   Smokeless tobacco: Never Used   Tobacco comment: pt. trying to quit  Substance Use Topics   Alcohol use: No    Alcohol/week: 1.0 standard drinks    Types: 1 Standard drinks or equivalent per week    Comment: occ   Drug use: No    Frequency: 1.0 times per week    Types: Marijuana,  Cocaine     Allergies   Tenofovir disoproxil   Review of Systems Review of Systems  Constitutional: Negative for fever.  HENT: Negative for congestion, ear pain and sore throat.   Respiratory: Negative for shortness of breath.   Cardiovascular: Negative for chest pain.  Gastrointestinal: Positive for abdominal pain, diarrhea and nausea. Negative for anal bleeding, blood in stool, constipation and vomiting.  Genitourinary: Negative for dysuria,  scrotal swelling and testicular pain.  All other systems reviewed and are negative.    Physical Exam Updated Vital Signs BP 111/78 (BP Location: Left Arm)    Pulse 87    Temp 98.3 F (36.8 C) (Oral)    Resp 16    SpO2 100%   Physical Exam Vitals signs and nursing note reviewed.  Constitutional:      General: He is not in acute distress.    Appearance: He is well-developed. He is not toxic-appearing.  HENT:     Head: Normocephalic and atraumatic.  Eyes:     General:        Right eye: No discharge.        Left eye: No discharge.     Conjunctiva/sclera: Conjunctivae normal.  Neck:     Musculoskeletal: Neck supple.  Cardiovascular:     Rate and Rhythm: Normal rate and regular rhythm.  Pulmonary:     Effort: Pulmonary effort is normal. No respiratory distress.     Breath sounds: Normal breath sounds. No wheezing, rhonchi or rales.  Abdominal:     General: There is no distension.     Palpations: Abdomen is soft.     Tenderness: There is generalized abdominal tenderness (worse in lower quadrants. ). There is no guarding or rebound.  Skin:    General: Skin is warm and dry.     Findings: No rash.  Neurological:     Mental Status: He is alert.     Comments: Clear speech.   Psychiatric:        Behavior: Behavior normal.     ED Treatments / Results  Labs (all labs ordered are listed, but only abnormal results are displayed) Labs Reviewed  URINALYSIS, ROUTINE W REFLEX MICROSCOPIC - Abnormal; Notable for the following components:      Result Value   Specific Gravity, Urine 1.033 (*)    All other components within normal limits  COMPREHENSIVE METABOLIC PANEL - Abnormal; Notable for the following components:   Sodium 133 (*)    Potassium 3.4 (*)    CO2 21 (*)    Calcium 8.6 (*)    Albumin 3.1 (*)    All other components within normal limits  CBC WITH DIFFERENTIAL/PLATELET - Abnormal; Notable for the following components:   MCH 25.9 (*)    RDW 15.8 (*)    All other  components within normal limits  GASTROINTESTINAL PANEL BY PCR, STOOL (REPLACES STOOL CULTURE)  LIPASE, BLOOD    EKG None  Radiology Ct Abdomen Pelvis W Contrast  Result Date: 05/19/2019 CLINICAL DATA:  Lower abdominal pain and diarrhea for 2 days. EXAM: CT ABDOMEN AND PELVIS WITH CONTRAST TECHNIQUE: Multidetector CT imaging of the abdomen and pelvis was performed using the standard protocol following bolus administration of intravenous contrast. CONTRAST:  100mL OMNIPAQUE IOHEXOL 300 MG/ML  SOLN COMPARISON:  CT scan 11/01/2017 FINDINGS: Lower chest: The lung bases are clear of acute process. No pleural effusion or pulmonary lesions. The heart is normal in size. No pericardial effusion. The distal esophagus and aorta are unremarkable. Hepatobiliary:  No focal hepatic lesions or intrahepatic biliary dilatation. The gallbladder is normal. No common bile duct dilatation. Pancreas: No mass, inflammation or ductal dilatation. Spleen: Normal size.  No focal lesions. Adrenals/Urinary Tract: The adrenal glands and kidneys are unremarkable. No renal, ureteral or bladder calculi or mass. No findings to suggest pyelonephritis. Stomach/Bowel: The stomach, duodenum, small bowel and colon are grossly normal without oral contrast. No acute inflammatory changes, mass lesions or obstructive findings. The terminal ileum and appendix appear normal. Vascular/Lymphatic: The aorta is normal in caliber. No dissection. The branch vessels are patent. The major venous structures are patent. No mesenteric or retroperitoneal mass or adenopathy. Small scattered lymph nodes are noted. Reproductive: The prostate gland and seminal vesicles are unremarkable. Other: No pelvic mass or adenopathy. No free pelvic fluid collections. No inguinal mass or adenopathy. No abdominal wall hernia or subcutaneous lesions. Musculoskeletal: No significant bony findings. IMPRESSION: No acute abdominal/pelvic findings, mass lesions or adenopathy.  Electronically Signed   By: Marijo Sanes M.D.   On: 05/19/2019 12:26    Procedures Procedures (including critical care time)  Medications Ordered in ED Medications - No data to display   Initial Impression / Assessment and Plan / ED Course  I have reviewed the triage vital signs and the nursing notes.  Pertinent labs & imaging results that were available during my care of the patient were reviewed by me and considered in my medical decision making (see chart for details).    Patient presents to the ED with complaints of abdominal pain. Patient nontoxic appearing, in no apparent distress, vitals WNL.. On exam patient tender to the generalized abdomen more so in the lower quadrants, no peritoneal signs. Will evaluate with labs and CT A/P. Analgesics, anti-emetics, and fluids administered.   ER work-up reviewed:  CBC: No leukocytosis or significant anemia.  CMP: Mild electrolyte abnormalities as above, PO K & NS fluids ordered. No LFT or renal function derangement.  Lipase: WNL UA: No UTI Imaging: CT/AP: No acute abdominal/pelvic findings, mass lesions or adenopathy  On repeat abdominal exam patient remains without peritoneal signs, doubt cholecystitis, pancreatitis, diverticulitis, appendicitis, bowel obstruction/perforation, or intra-abdominal abscess. Given unable to provide stool sample in the ED feel significant GI pathogen requiring abx is less likely, he also has only had a few episodes of diarrhea and has not had recent abx, travel, or hospitalizations. tient tolerating PO in the emergency department, feeling improved. Encouraged infectious disease follow up. Will discharge home with supportive measures. I discussed results, treatment plan, need for PCP follow-up, and return precautions with the patient. Provided opportunity for questions, patient confirmed understanding and is in agreement with plan.    Final Clinical Impressions(s) / ED Diagnoses   Final diagnoses:  Lower  abdominal pain    ED Discharge Orders         Ordered    dicyclomine (BENTYL) 20 MG tablet  Every 8 hours PRN     05/19/19 1327    ondansetron (ZOFRAN ODT) 4 MG disintegrating tablet  Every 8 hours PRN     05/19/19 1327           Lylla Eifler, [place], PA-C 05/19/19 1328    Carmin Muskrat, MD 05/20/19 1015

## 2019-05-19 NOTE — ED Notes (Signed)
Patient transported to CT 

## 2019-05-19 NOTE — ED Triage Notes (Signed)
Pt from home via PTAR; c/o lower abd pain 8/10, no radiation, and diarrhea x 2 days; denies vomiting; pt seen at [facility] 2 days ago for finger lac and groin pain; hx HIV and syphillis  98/50 Hr 64 RR 18

## 2019-05-19 NOTE — ED Notes (Signed)
Beverage at bedside, pt encouraged to drink

## 2019-05-19 NOTE — ED Notes (Signed)
Patient verbalizes understanding of discharge instructions. Opportunity for questioning and answers were provided. Pt discharged from ED. 

## 2019-05-25 ENCOUNTER — Encounter: Payer: Self-pay | Admitting: Internal Medicine

## 2019-05-25 NOTE — Progress Notes (Signed)
Patient ID: Blake Chambers, male   DOB: [birth year], 28 y.o.   MRN: [id number] Called patient from Viral Load List call could not be completed at this time

## 2019-07-07 ENCOUNTER — Telehealth: Payer: Self-pay | Admitting: Pharmacy Technician

## 2019-07-07 NOTE — Telephone Encounter (Signed)
[facility] Patient Advocate Encounter  Insurance verification completed.    The patient is insured through [place] [employer] and has a $3 copay.  We will continue to follow to see if copay assistance is needed.  Dorothey Oetken E. Albion Weatherholtz, CPhT [facility] Patient Advocate [facility] Phone: [phone] Fax:  [phone]   

## 2019-07-10 ENCOUNTER — Other Ambulatory Visit: Payer: Self-pay

## 2019-07-10 ENCOUNTER — Encounter (HOSPITAL_COMMUNITY): Payer: Self-pay | Admitting: Emergency Medicine

## 2019-07-10 ENCOUNTER — Emergency Department (HOSPITAL_COMMUNITY)
Admission: EM | Admit: 2019-07-10 | Discharge: 2019-07-10 | Disposition: A | Discharge status: Home / Self Care | Payer: Medicaid Other | Attending: Emergency Medicine | Admitting: Emergency Medicine

## 2019-07-10 DIAGNOSIS — A539 Syphilis, unspecified: Secondary | ICD-10-CM | POA: Diagnosis not present

## 2019-07-10 DIAGNOSIS — R220 Localized swelling, mass and lump, head: Secondary | ICD-10-CM | POA: Diagnosis present

## 2019-07-10 DIAGNOSIS — Z79899 Other long term (current) drug therapy: Secondary | ICD-10-CM | POA: Insufficient documentation

## 2019-07-10 DIAGNOSIS — R05 Cough: Secondary | ICD-10-CM | POA: Insufficient documentation

## 2019-07-10 DIAGNOSIS — L0201 Cutaneous abscess of face: Secondary | ICD-10-CM | POA: Diagnosis not present

## 2019-07-10 DIAGNOSIS — Z9114 Patient's other noncompliance with medication regimen: Secondary | ICD-10-CM | POA: Diagnosis not present

## 2019-07-10 DIAGNOSIS — F17210 Nicotine dependence, cigarettes, uncomplicated: Secondary | ICD-10-CM | POA: Diagnosis not present

## 2019-07-10 DIAGNOSIS — Z21 Asymptomatic human immunodeficiency virus [HIV] infection status: Secondary | ICD-10-CM | POA: Diagnosis not present

## 2019-07-10 MED ORDER — LIDOCAINE HCL (PF) 1 % IJ SOLN
5.0000 mL | Freq: Once | INTRAMUSCULAR | Status: AC
  Administered 2019-07-10: 5 mL
  Filled 2019-07-10: qty 5

## 2019-07-10 MED ORDER — OXYCODONE-ACETAMINOPHEN 5-325 MG PO TABS: 1.0000 | ORAL_TABLET | Freq: Once | ORAL | Status: DC

## 2019-07-10 MED ORDER — PENICILLIN G BENZATHINE 1200000 UNIT/2ML IM SUSP
2.4000 10*6.[IU] | Freq: Once | INTRAMUSCULAR | Status: AC
  Administered 2019-07-10: 2.4 10*6.[IU] via INTRAMUSCULAR
  Filled 2019-07-10: qty 4

## 2019-07-10 NOTE — ED Notes (Signed)
Pt called out for pain in jaw, notified EDP who immediately placed orders for medication (see mar), when returned to room, pt and belongings gone and v/s supplies scattered on floor. Bathrooms in pods empty, will look into other restrooms. Pt alert and oriented prior to this event.

## 2019-07-10 NOTE — ED Triage Notes (Signed)
Pt to triage via [facility] with partner that tested + for COVID yesterday and was treated for STD yesterday.  C/o cough for 2-3 weeks, abscess to R side of face x 1 month.  Pt requesting STD testing and COVID testing.  Reports itchy rash to groin x 3 weeks.

## 2019-07-10 NOTE — ED Notes (Signed)
Searched department bathrooms, pt not returned to room. Pt to be assumed left AMA. Will be removed from active pt list
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
## 2019-07-11 ENCOUNTER — Ambulatory Visit: Payer: Medicaid Other | Admitting: Pharmacist

## 2019-07-11 LAB — RPR
RPR Ser Ql: REACTIVE — AB
RPR Titer: 1:32 {titer}

## 2019-07-11 LAB — T-HELPER CELLS (CD4) COUNT (NOT AT ARMC)
CD4 % Helper T Cell: 26 % — ABNORMAL LOW (ref 33–65)
CD4 T Cell Abs: 472 /uL (ref 400–1790)

## 2019-07-12 LAB — T.PALLIDUM AB, TOTAL: T Pallidum Abs: REACTIVE — AB

## 2019-11-07 ENCOUNTER — Ambulatory Visit: Payer: Medicaid Other | Admitting: Pharmacist

## 2020-03-05 ENCOUNTER — Other Ambulatory Visit: Payer: Self-pay

## 2020-03-05 ENCOUNTER — Emergency Department (HOSPITAL_COMMUNITY)
Admission: EM | Admit: 2020-03-05 | Discharge: 2020-03-05 | Disposition: A | Discharge status: Home / Self Care | Payer: Medicaid Other | Attending: Emergency Medicine | Admitting: Emergency Medicine

## 2020-03-05 ENCOUNTER — Emergency Department (HOSPITAL_COMMUNITY): Payer: Medicaid Other

## 2020-03-05 ENCOUNTER — Encounter (HOSPITAL_COMMUNITY): Payer: Self-pay

## 2020-03-05 DIAGNOSIS — L03012 Cellulitis of left finger: Secondary | ICD-10-CM | POA: Insufficient documentation

## 2020-03-05 DIAGNOSIS — S0990XA Unspecified injury of head, initial encounter: Secondary | ICD-10-CM | POA: Insufficient documentation

## 2020-03-05 DIAGNOSIS — S0101XA Laceration without foreign body of scalp, initial encounter: Secondary | ICD-10-CM | POA: Diagnosis present

## 2020-03-05 DIAGNOSIS — F17210 Nicotine dependence, cigarettes, uncomplicated: Secondary | ICD-10-CM | POA: Diagnosis not present

## 2020-03-05 DIAGNOSIS — F1290 Cannabis use, unspecified, uncomplicated: Secondary | ICD-10-CM | POA: Insufficient documentation

## 2020-03-05 MED ORDER — DOXYCYCLINE HYCLATE 100 MG PO CAPS: 100.0000 mg | ORAL_CAPSULE | Freq: Two times a day (BID) | ORAL | 0 refills | Status: AC
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
## 2020-03-05 NOTE — Discharge Instructions (Addendum)
I have prescribed you an antibiotic called doxycycline. You are going to take this twice a day for the next 10 days. Please do not stop taking this medication early.  Please follow-up with Dr. Roney Mans. His phone number is below. He is a Hydrographic surveyor who needs to evaluate your hand as soon as possible.  Please return to the ER if you develop worsening symptoms in the hand.  It was a pleasure to meet you.

## 2020-04-11 ENCOUNTER — Emergency Department (HOSPITAL_COMMUNITY): Payer: Medicaid Other

## 2020-04-11 ENCOUNTER — Other Ambulatory Visit: Payer: Self-pay

## 2020-04-11 ENCOUNTER — Emergency Department (HOSPITAL_COMMUNITY)
Admission: EM | Admit: 2020-04-11 | Discharge: 2020-04-11 | Discharge status: Against Medical Advice | Payer: Medicaid Other | Attending: Emergency Medicine | Admitting: Emergency Medicine

## 2020-04-11 ENCOUNTER — Encounter (HOSPITAL_COMMUNITY): Payer: Self-pay

## 2020-04-11 DIAGNOSIS — M25532 Pain in left wrist: Secondary | ICD-10-CM | POA: Diagnosis not present

## 2020-04-11 DIAGNOSIS — Z5321 Procedure and treatment not carried out due to patient leaving prior to being seen by health care provider: Secondary | ICD-10-CM | POA: Insufficient documentation

## 2020-04-11 DIAGNOSIS — M25531 Pain in right wrist: Secondary | ICD-10-CM

## 2020-04-11 DIAGNOSIS — B20 Human immunodeficiency virus [HIV] disease: Secondary | ICD-10-CM | POA: Diagnosis not present

## 2020-04-11 LAB — CBC WITH DIFFERENTIAL/PLATELET
Abs Immature Granulocytes: 0.07 10*3/uL (ref 0.00–0.07)
Basophils Absolute: 0.1 10*3/uL (ref 0.0–0.1)
Basophils Relative: 1 %
Eosinophils Absolute: 0.1 10*3/uL (ref 0.0–0.5)
Eosinophils Relative: 1 %
HCT: 48.5 % (ref 39.0–52.0)
Hemoglobin: 14.9 g/dL (ref 13.0–17.0)
Immature Granulocytes: 1 %
Lymphocytes Relative: 20 %
Lymphs Abs: 1.9 10*3/uL (ref 0.7–4.0)
MCH: 26 pg (ref 26.0–34.0)
MCHC: 30.7 g/dL (ref 30.0–36.0)
MCV: 84.6 fL (ref 80.0–100.0)
Monocytes Absolute: 1.4 10*3/uL — ABNORMAL HIGH (ref 0.1–1.0)
Monocytes Relative: 15 %
Neutro Abs: 5.9 10*3/uL (ref 1.7–7.7)
Neutrophils Relative %: 62 %
Platelets: 222 10*3/uL (ref 150–400)
RBC: 5.73 MIL/uL (ref 4.22–5.81)
RDW: 16.1 % — ABNORMAL HIGH (ref 11.5–15.5)
WBC: 9.4 10*3/uL (ref 4.0–10.5)
nRBC: 0 % (ref 0.0–0.2)

## 2020-04-11 LAB — COMPREHENSIVE METABOLIC PANEL
ALT: 28 U/L (ref 0–44)
AST: 38 U/L (ref 15–41)
Albumin: 3.5 g/dL (ref 3.5–5.0)
Alkaline Phosphatase: 73 U/L (ref 38–126)
Anion gap: 12 (ref 5–15)
BUN: 11 mg/dL (ref 6–20)
CO2: 21 mmol/L — ABNORMAL LOW (ref 22–32)
Calcium: 9 mg/dL (ref 8.9–10.3)
Chloride: 104 mmol/L (ref 98–111)
Creatinine, Ser: 1.11 mg/dL (ref 0.61–1.24)
GFR, Estimated: >60 mL/min (ref >60)
Glucose, Bld: 96 mg/dL (ref 70–99)
Potassium: 5.1 mmol/L (ref 3.5–5.1)
Sodium: 137 mmol/L (ref 135–145)
Total Bilirubin: 0.5 mg/dL (ref 0.3–1.2)
Total Protein: 8 g/dL (ref 6.5–8.1)

## 2020-04-11 LAB — ETHANOL: Alcohol, Ethyl (B): <10 mg/dL (ref <10)

## 2020-04-11 MED ORDER — SODIUM CHLORIDE 0.9 % IV BOLUS
1000.0000 mL | Freq: Once | INTRAVENOUS | Status: AC
  Administered 2020-04-11: 1000 mL via INTRAVENOUS

## 2020-04-11 MED ORDER — CLINDAMYCIN PHOSPHATE 600 MG/50ML IV SOLN: 600.0000 mg | Freq: Once | INTRAVENOUS | Status: DC

## 2020-04-11 MED ORDER — VANCOMYCIN HCL IN DEXTROSE 1-5 GM/200ML-% IV SOLN: 1000.0000 mg | Freq: Once | INTRAVENOUS | Status: DC

## 2020-04-11 NOTE — Care Management (Addendum)
ED CM went to [facility] to meet with patient, CM was made aware that patient left AMA.
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
## 2020-04-11 NOTE — ED Notes (Signed)
Pt pulled out IV & left AMA.

## 2020-05-20 ENCOUNTER — Encounter (HOSPITAL_COMMUNITY): Payer: Self-pay | Admitting: Emergency Medicine

## 2020-05-20 ENCOUNTER — Emergency Department (HOSPITAL_COMMUNITY): Payer: Medicaid Other

## 2020-05-20 ENCOUNTER — Emergency Department (HOSPITAL_COMMUNITY)
Admission: EM | Admit: 2020-05-20 | Discharge: 2020-05-20 | Disposition: A | Discharge status: Home / Self Care | Payer: Medicaid Other | Attending: Emergency Medicine | Admitting: Emergency Medicine

## 2020-05-20 ENCOUNTER — Other Ambulatory Visit: Payer: Self-pay

## 2020-05-20 DIAGNOSIS — M86141 Other acute osteomyelitis, right hand: Secondary | ICD-10-CM | POA: Diagnosis not present

## 2020-05-20 DIAGNOSIS — Z21 Asymptomatic human immunodeficiency virus [HIV] infection status: Secondary | ICD-10-CM | POA: Diagnosis not present

## 2020-05-20 DIAGNOSIS — M869 Osteomyelitis, unspecified: Secondary | ICD-10-CM

## 2020-05-20 DIAGNOSIS — R2232 Localized swelling, mass and lump, left upper limb: Secondary | ICD-10-CM | POA: Diagnosis present

## 2020-05-20 DIAGNOSIS — F1590 Other stimulant use, unspecified, uncomplicated: Secondary | ICD-10-CM | POA: Diagnosis not present

## 2020-05-20 DIAGNOSIS — F17210 Nicotine dependence, cigarettes, uncomplicated: Secondary | ICD-10-CM | POA: Insufficient documentation

## 2020-05-20 LAB — CBC WITH DIFFERENTIAL/PLATELET
Abs Immature Granulocytes: 0.02 10*3/uL (ref 0.00–0.07)
Basophils Absolute: 0 10*3/uL (ref 0.0–0.1)
Basophils Relative: 0 %
Eosinophils Absolute: 0.1 10*3/uL (ref 0.0–0.5)
Eosinophils Relative: 1 %
HCT: 39.6 % (ref 39.0–52.0)
Hemoglobin: 13 g/dL (ref 13.0–17.0)
Immature Granulocytes: 0 %
Lymphocytes Relative: 32 %
Lymphs Abs: 1.9 10*3/uL (ref 0.7–4.0)
MCH: 26.4 pg (ref 26.0–34.0)
MCHC: 32.8 g/dL (ref 30.0–36.0)
MCV: 80.5 fL (ref 80.0–100.0)
Monocytes Absolute: 0.6 10*3/uL (ref 0.1–1.0)
Monocytes Relative: 10 %
Neutro Abs: 3.3 10*3/uL (ref 1.7–7.7)
Neutrophils Relative %: 57 %
Platelets: 272 10*3/uL (ref 150–400)
RBC: 4.92 MIL/uL (ref 4.22–5.81)
RDW: 16.1 % — ABNORMAL HIGH (ref 11.5–15.5)
WBC: 5.9 10*3/uL (ref 4.0–10.5)
nRBC: 0 % (ref 0.0–0.2)

## 2020-05-20 LAB — BASIC METABOLIC PANEL
Anion gap: 9 (ref 5–15)
BUN: 11 mg/dL (ref 6–20)
CO2: 24 mmol/L (ref 22–32)
Calcium: 8.6 mg/dL — ABNORMAL LOW (ref 8.9–10.3)
Chloride: 104 mmol/L (ref 98–111)
Creatinine, Ser: 1.17 mg/dL (ref 0.61–1.24)
GFR, Estimated: >60 mL/min (ref >60)
Glucose, Bld: 84 mg/dL (ref 70–99)
Potassium: 3.8 mmol/L (ref 3.5–5.1)
Sodium: 137 mmol/L (ref 135–145)

## 2020-05-20 NOTE — ED Notes (Signed)
Pt seen leaving without being discharged. Dr.Miller informed.

## 2020-05-20 NOTE — ED Triage Notes (Signed)
C/o L pinky swelling since February.  Denies pain.
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
## 2020-05-21 LAB — CD4/CD8 (T-HELPER/T-SUPPRESSOR CELL)
CD4 absolute: 660 /uL (ref 400–1790)
CD4%: 34 % (ref 33–65)
CD8 T Cell Abs: 729 /uL (ref 190–1000)
CD8tox: 37 % (ref 12–40)
Ratio: 0.91 — ABNORMAL LOW (ref 1.0–3.0)
Total lymphocyte count: 1948 /uL (ref 1000–4000)

## 2020-05-22 LAB — HIV-1 RNA QUANT-NO REFLEX-BLD
HIV 1 RNA Quant: 6190 copies/mL
LOG10 HIV-1 RNA: 3.792 log10copy/mL

## 2020-07-05 ENCOUNTER — Other Ambulatory Visit: Payer: Self-pay

## 2020-07-05 ENCOUNTER — Encounter (HOSPITAL_COMMUNITY): Payer: Self-pay

## 2020-07-05 ENCOUNTER — Emergency Department (HOSPITAL_COMMUNITY)
Admission: EM | Admit: 2020-07-05 | Discharge: 2020-07-05 | Disposition: A | Discharge status: Home / Self Care | Payer: Medicaid Other | Attending: Emergency Medicine | Admitting: Emergency Medicine

## 2020-07-05 DIAGNOSIS — J00 Acute nasopharyngitis [common cold]: Secondary | ICD-10-CM | POA: Diagnosis not present

## 2020-07-05 DIAGNOSIS — Z5900 Homelessness unspecified: Secondary | ICD-10-CM | POA: Insufficient documentation

## 2020-07-05 DIAGNOSIS — Z5321 Procedure and treatment not carried out due to patient leaving prior to being seen by health care provider: Secondary | ICD-10-CM | POA: Diagnosis not present

## 2020-07-05 LAB — BASIC METABOLIC PANEL
Anion gap: 10 (ref 5–15)
BUN: 16 mg/dL (ref 6–20)
CO2: 23 mmol/L (ref 22–32)
Calcium: 8.8 mg/dL — ABNORMAL LOW (ref 8.9–10.3)
Chloride: 107 mmol/L (ref 98–111)
Creatinine, Ser: 1.02 mg/dL (ref 0.61–1.24)
GFR, Estimated: >60 mL/min (ref >60)
Glucose, Bld: 62 mg/dL — ABNORMAL LOW (ref 70–99)
Potassium: 3.7 mmol/L (ref 3.5–5.1)
Sodium: 140 mmol/L (ref 135–145)

## 2020-07-05 LAB — CBC
HCT: 46.4 % (ref 39.0–52.0)
Hemoglobin: 14.7 g/dL (ref 13.0–17.0)
MCH: 26.4 pg (ref 26.0–34.0)
MCHC: 31.7 g/dL (ref 30.0–36.0)
MCV: 83.3 fL (ref 80.0–100.0)
Platelets: 233 10*3/uL (ref 150–400)
RBC: 5.57 MIL/uL (ref 4.22–5.81)
RDW: 15.9 % — ABNORMAL HIGH (ref 11.5–15.5)
WBC: 4.9 10*3/uL (ref 4.0–10.5)
nRBC: 0 % (ref 0.0–0.2)
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
## 2020-07-05 NOTE — ED Notes (Signed)
Called x 3 for treatment room, no answer 

## 2020-07-20 ENCOUNTER — Emergency Department (HOSPITAL_COMMUNITY)
Admission: EM | Admit: 2020-07-20 | Discharge: 2020-07-20 | Disposition: A | Discharge status: Home / Self Care | Payer: Medicaid Other | Attending: Emergency Medicine | Admitting: Emergency Medicine

## 2020-07-20 ENCOUNTER — Encounter (HOSPITAL_COMMUNITY): Payer: Self-pay

## 2020-07-20 DIAGNOSIS — B20 Human immunodeficiency virus [HIV] disease: Secondary | ICD-10-CM | POA: Diagnosis not present

## 2020-07-20 DIAGNOSIS — F17210 Nicotine dependence, cigarettes, uncomplicated: Secondary | ICD-10-CM | POA: Insufficient documentation

## 2020-07-20 DIAGNOSIS — Z5900 Homelessness unspecified: Secondary | ICD-10-CM | POA: Diagnosis not present

## 2020-07-20 DIAGNOSIS — Z609 Problem related to social environment, unspecified: Secondary | ICD-10-CM | POA: Insufficient documentation

## 2020-07-20 DIAGNOSIS — Z599 Problem related to housing and economic circumstances, unspecified: Secondary | ICD-10-CM

## 2020-07-20 NOTE — Discharge Instructions (Addendum)
You can follow-up using the resource guide with the listed shelters. Return to the ER if you start to experience chest pain, shortness of breath, vomiting, diarrhea or fever.
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
## 2020-08-01 ENCOUNTER — Other Ambulatory Visit: Payer: Self-pay

## 2020-08-01 ENCOUNTER — Emergency Department (HOSPITAL_COMMUNITY)
Admission: EM | Admit: 2020-08-01 | Discharge: 2020-08-01 | Disposition: A | Discharge status: Home / Self Care | Payer: Medicaid Other | Attending: Emergency Medicine | Admitting: Emergency Medicine

## 2020-08-01 DIAGNOSIS — M79604 Pain in right leg: Secondary | ICD-10-CM | POA: Diagnosis not present

## 2020-08-01 DIAGNOSIS — M79605 Pain in left leg: Secondary | ICD-10-CM | POA: Insufficient documentation

## 2020-08-01 DIAGNOSIS — Z79899 Other long term (current) drug therapy: Secondary | ICD-10-CM | POA: Diagnosis not present

## 2020-08-01 DIAGNOSIS — M79669 Pain in unspecified lower leg: Secondary | ICD-10-CM | POA: Diagnosis not present

## 2020-08-01 DIAGNOSIS — Z5900 Homelessness unspecified: Secondary | ICD-10-CM | POA: Diagnosis not present

## 2020-08-01 DIAGNOSIS — M79606 Pain in leg, unspecified: Secondary | ICD-10-CM | POA: Diagnosis present

## 2020-08-01 DIAGNOSIS — F17210 Nicotine dependence, cigarettes, uncomplicated: Secondary | ICD-10-CM | POA: Insufficient documentation

## 2020-08-01 DIAGNOSIS — Z21 Asymptomatic human immunodeficiency virus [HIV] infection status: Secondary | ICD-10-CM | POA: Insufficient documentation

## 2020-08-01 LAB — BASIC METABOLIC PANEL
Anion gap: 10 (ref 5–15)
BUN: 12 mg/dL (ref 6–20)
CO2: 24 mmol/L (ref 22–32)
Calcium: 9 mg/dL (ref 8.9–10.3)
Chloride: 106 mmol/L (ref 98–111)
Creatinine, Ser: 0.97 mg/dL (ref 0.61–1.24)
GFR, Estimated: >60 mL/min (ref >60)
Glucose, Bld: 92 mg/dL (ref 70–99)
Potassium: 3.6 mmol/L (ref 3.5–5.1)
Sodium: 140 mmol/L (ref 135–145)

## 2020-08-01 LAB — CBC WITH DIFFERENTIAL/PLATELET
Abs Immature Granulocytes: 0.01 10*3/uL (ref 0.00–0.07)
Basophils Absolute: 0 10*3/uL (ref 0.0–0.1)
Basophils Relative: 0 %
Eosinophils Absolute: 0.1 10*3/uL (ref 0.0–0.5)
Eosinophils Relative: 1 %
HCT: 42.9 % (ref 39.0–52.0)
Hemoglobin: 13.7 g/dL (ref 13.0–17.0)
Immature Granulocytes: 0 %
Lymphocytes Relative: 33 %
Lymphs Abs: 1.6 10*3/uL (ref 0.7–4.0)
MCH: 26.1 pg (ref 26.0–34.0)
MCHC: 31.9 g/dL (ref 30.0–36.0)
MCV: 81.7 fL (ref 80.0–100.0)
Monocytes Absolute: 0.5 10*3/uL (ref 0.1–1.0)
Monocytes Relative: 10 %
Neutro Abs: 2.7 10*3/uL (ref 1.7–7.7)
Neutrophils Relative %: 56 %
Platelets: 266 10*3/uL (ref 150–400)
RBC: 5.25 MIL/uL (ref 4.22–5.81)
RDW: 15.5 % (ref 11.5–15.5)
WBC: 4.9 10*3/uL (ref 4.0–10.5)
nRBC: 0 % (ref 0.0–0.2)

## 2020-08-01 LAB — CK: Total CK: 365 U/L (ref 49–397)

## 2020-08-01 NOTE — ED Triage Notes (Signed)
Pt presents via [facility], called from the parking lot of [facility]. C/o leg cramps from walking over the last week. States he is homeless and this happened before

## 2020-08-01 NOTE — ED Provider Notes (Signed)
[facility]-EMERGENCY DEPT Provider Note: Lowella Dell, MD, FACEP  CSN: [id number] MRN: [id number] ARRIVAL: 08/01/20 at 0420 ROOM: [facility]07/[facility]07   CHIEF COMPLAINT  Leg Pain   HISTORY OF PRESENT ILLNESS  08/01/20 5:03 AM Blake Chambers is a 30 y.o. male with a history of mental illness and HIV infection.  He called EMS from a parking lot just prior to arrival.  He states he is having leg cramps from walking too much over the past week.  He is homeless and has had this happen before.  He rates his pain as a 3 out of 10, worse with movement.  He states his leg are getting better now that he is lying down and covering them with a blanket.   Past Medical History:  Diagnosis Date  . Anxiety   . Bipolar affective (HCC)   . HIV (human immunodeficiency virus infection) (HCC)   . Immune deficiency disorder (HCC)   . Mental health disorder   . Syphilis     Past Surgical History:  Procedure Laterality Date  . IRRIGATION AND DEBRIDEMENT ABSCESS N/A 08/20/2017   Procedure: IRRIGATION AND DEBRIDEMENT PERI RECTAL ABSCESS;  Surgeon: Andria Meuse, MD;  Location: [facility] OR;  Service: General;  Laterality: N/A;    Family History  Family history unknown: Yes    Social History   Tobacco Use  . Smoking status: Current Every Day Smoker    Packs/day: 0.30    Types: Cigarettes  . Smokeless tobacco: Never Used  . Tobacco comment: pt. trying to quit  Vaping Use  . Vaping Use: Never used  Substance Use Topics  . Alcohol use: No    Alcohol/week: 1.0 standard drink    Types: 1 Standard drinks or equivalent per week  . Drug use: Not Currently    Frequency: 1.0 times per week    Types: Marijuana, Cocaine    Prior to Admission medications   Medication Sig Start Date End Date Taking? Authorizing Provider  ibuprofen (ADVIL,MOTRIN) 200 MG tablet Take 400 mg by mouth every 6 (six) hours as needed for headache or mild pain.   Yes [provider]  darunavir-cobicistat (PREZCOBIX) 800-150 MG  tablet Take 1 tablet by mouth daily with supper. 08/26/16   Gardiner Barefoot, MD  emtricitabine-tenofovir AF (DESCOVY) 200-25 MG tablet Take 1 tablet by mouth daily. 08/26/16   Gardiner Barefoot, MD    Allergies Tenofovir disoproxil   REVIEW OF SYSTEMS  Negative except as noted here or in the History of Present Illness.   PHYSICAL EXAMINATION  Initial Vital Signs Blood pressure 121/77, pulse 68, temperature 98.1 F (36.7 C), resp. rate 16, SpO2 100 %.  Examination General: Well-developed, well-nourished male in no acute distress; appearance consistent with age of record HENT: normocephalic; atraumatic Eyes: pupils equal, round and reactive to light; extraocular muscles intact Neck: supple Heart: regular rate and rhythm Lungs: clear to auscultation bilaterally Abdomen: soft; nondistended; nontender; bowel sounds present Extremities: No deformity; full range of motion; pulses normal; calf muscle compartments soft Neurologic: Awake, alert and oriented; motor function intact in all extremities and symmetric; no facial droop Skin: Warm and dry Psychiatric: Flat affect   RESULTS  Summary of this visit's results, reviewed and interpreted by myself:   EKG Interpretation  Date/Time:    Ventricular Rate:    PR Interval:    QRS Duration:   QT Interval:    QTC Calculation:   R Axis:     Text Interpretation:  Laboratory Studies: Results for orders placed or performed during the hospital encounter of 08/01/20 (from the past 24 hour(s))  CBC with Differential/Platelet     Status: None   Collection Time: 08/01/20  5:50 AM  Result Value Ref Range   WBC 4.9 4.0 - 10.5 K/uL   RBC 5.25 4.22 - 5.81 MIL/uL   Hemoglobin 13.7 13.0 - 17.0 g/dL   HCT 76.7 34.1 - 93.7 %   MCV 81.7 80.0 - 100.0 fL   MCH 26.1 26.0 - 34.0 pg   MCHC 31.9 30.0 - 36.0 g/dL   RDW 90.2 40.9 - 73.5 %   Platelets 266 150 - 400 K/uL   nRBC 0.0 0.0 - 0.2 %   Neutrophils Relative % 56 %   Neutro Abs 2.7 1.7 -  7.7 K/uL   Lymphocytes Relative 33 %   Lymphs Abs 1.6 0.7 - 4.0 K/uL   Monocytes Relative 10 %   Monocytes Absolute 0.5 0.1 - 1.0 K/uL   Eosinophils Relative 1 %   Eosinophils Absolute 0.1 0.0 - 0.5 K/uL   Basophils Relative 0 %   Basophils Absolute 0.0 0.0 - 0.1 K/uL   Immature Granulocytes 0 %   Abs Immature Granulocytes 0.01 0.00 - 0.07 K/uL  Basic metabolic panel     Status: None   Collection Time: 08/01/20  5:50 AM  Result Value Ref Range   Sodium 140 135 - 145 mmol/L   Potassium 3.6 3.5 - 5.1 mmol/L   Chloride 106 98 - 111 mmol/L   CO2 24 22 - 32 mmol/L   Glucose, Bld 92 70 - 99 mg/dL   BUN 12 6 - 20 mg/dL   Creatinine, Ser 3.29 0.61 - 1.24 mg/dL   Calcium 9.0 8.9 - 92.4 mg/dL   GFR, Estimated >26 >83 mL/min   Anion gap 10 5 - 15  CK     Status: None   Collection Time: 08/01/20  5:50 AM  Result Value Ref Range   Total CK 365 49 - 397 U/L   Imaging Studies: No results found.  ED COURSE and MDM  Nursing notes, initial and subsequent vitals signs, including pulse oximetry, reviewed and interpreted by myself.  Vitals:   08/01/20 0503  BP: 121/77  Pulse: 68  Resp: 16  Temp: 98.1 F (36.7 C)  SpO2: 100%  Weight: 70.8 kg  Height: 5\' 9"  (1.753 m)   Medications - No data to display  No evidence of rhabdomyolysis or electrolyte abnormality.  Patient declined any analgesics.  I do not believe further work-up is indicated.  PROCEDURES  Procedures   ED DIAGNOSES     ICD-10-CM   1. Pain in both lower extremities  M79.604    M79.605        Adelyn Roscher, , MD 08/01/20 [phone]

## 2020-08-13 ENCOUNTER — Encounter (HOSPITAL_COMMUNITY): Payer: Self-pay | Admitting: Registered Nurse

## 2020-08-13 ENCOUNTER — Other Ambulatory Visit: Payer: Self-pay

## 2020-08-13 ENCOUNTER — Ambulatory Visit (HOSPITAL_COMMUNITY)
Admission: EM | Admit: 2020-08-13 | Discharge: 2020-08-13 | Disposition: A | Discharge status: Home / Self Care | Payer: Medicaid Other

## 2020-08-13 DIAGNOSIS — F1220 Cannabis dependence, uncomplicated: Secondary | ICD-10-CM | POA: Diagnosis present

## 2020-08-13 DIAGNOSIS — Z638 Other specified problems related to primary support group: Secondary | ICD-10-CM

## 2020-08-13 DIAGNOSIS — F1420 Cocaine dependence, uncomplicated: Secondary | ICD-10-CM

## 2020-08-13 DIAGNOSIS — F319 Bipolar disorder, unspecified: Secondary | ICD-10-CM | POA: Diagnosis present
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
## 2020-08-13 NOTE — ED Notes (Signed)
Bus pass given per pt request to get to sister's house. Stable at time of d/c

## 2020-09-11 ENCOUNTER — Emergency Department (HOSPITAL_COMMUNITY)
Admission: EM | Admit: 2020-09-11 | Discharge: 2020-09-11 | Disposition: A | Discharge status: Home / Self Care | Payer: Medicaid Other | Source: Home / Self Care | Attending: Emergency Medicine | Admitting: Emergency Medicine

## 2020-09-11 ENCOUNTER — Encounter (HOSPITAL_BASED_OUTPATIENT_CLINIC_OR_DEPARTMENT_OTHER): Payer: Self-pay

## 2020-09-11 ENCOUNTER — Other Ambulatory Visit: Payer: Self-pay

## 2020-09-11 ENCOUNTER — Emergency Department (HOSPITAL_COMMUNITY): Payer: Medicaid Other

## 2020-09-11 ENCOUNTER — Encounter (HOSPITAL_COMMUNITY): Payer: Self-pay

## 2020-09-11 ENCOUNTER — Emergency Department (HOSPITAL_BASED_OUTPATIENT_CLINIC_OR_DEPARTMENT_OTHER): Payer: Medicaid Other

## 2020-09-11 ENCOUNTER — Inpatient Hospital Stay (HOSPITAL_BASED_OUTPATIENT_CLINIC_OR_DEPARTMENT_OTHER)
Admission: EM | Admit: 2020-09-11 | Discharge: 2020-09-13 | DRG: 540 | Discharge status: Against Medical Advice | Payer: Medicaid Other | Attending: Family Medicine | Admitting: Family Medicine

## 2020-09-11 DIAGNOSIS — S59912A Unspecified injury of left forearm, initial encounter: Secondary | ICD-10-CM

## 2020-09-11 DIAGNOSIS — M869 Osteomyelitis, unspecified: Secondary | ICD-10-CM | POA: Diagnosis present

## 2020-09-11 DIAGNOSIS — F17210 Nicotine dependence, cigarettes, uncomplicated: Secondary | ICD-10-CM | POA: Diagnosis present

## 2020-09-11 DIAGNOSIS — Z21 Asymptomatic human immunodeficiency virus [HIV] infection status: Secondary | ICD-10-CM | POA: Insufficient documentation

## 2020-09-11 DIAGNOSIS — S50812A Abrasion of left forearm, initial encounter: Secondary | ICD-10-CM | POA: Insufficient documentation

## 2020-09-11 DIAGNOSIS — Z888 Allergy status to other drugs, medicaments and biological substances status: Secondary | ICD-10-CM

## 2020-09-11 DIAGNOSIS — M86142 Other acute osteomyelitis, left hand: Principal | ICD-10-CM | POA: Diagnosis present

## 2020-09-11 DIAGNOSIS — M79631 Pain in right forearm: Secondary | ICD-10-CM | POA: Diagnosis present

## 2020-09-11 DIAGNOSIS — B20 Human immunodeficiency virus [HIV] disease: Secondary | ICD-10-CM | POA: Diagnosis present

## 2020-09-11 DIAGNOSIS — M79632 Pain in left forearm: Secondary | ICD-10-CM

## 2020-09-11 DIAGNOSIS — Z20822 Contact with and (suspected) exposure to covid-19: Secondary | ICD-10-CM | POA: Diagnosis present

## 2020-09-11 DIAGNOSIS — F319 Bipolar disorder, unspecified: Secondary | ICD-10-CM | POA: Diagnosis present

## 2020-09-11 DIAGNOSIS — Z79899 Other long term (current) drug therapy: Secondary | ICD-10-CM

## 2020-09-11 DIAGNOSIS — A539 Syphilis, unspecified: Secondary | ICD-10-CM | POA: Diagnosis present

## 2020-09-11 DIAGNOSIS — M84445A Pathological fracture, left finger(s), initial encounter for fracture: Secondary | ICD-10-CM | POA: Diagnosis present

## 2020-09-11 DIAGNOSIS — N182 Chronic kidney disease, stage 2 (mild): Secondary | ICD-10-CM | POA: Diagnosis present

## 2020-09-11 HISTORY — DX: Osteomyelitis, unspecified: M86.9

## 2020-09-11 LAB — RESP PANEL BY RT-PCR (FLU A&B, COVID) ARPGX2
Influenza A by PCR: NEGATIVE
Influenza B by PCR: NEGATIVE
SARS Coronavirus 2 by RT PCR: NEGATIVE

## 2020-09-11 LAB — BASIC METABOLIC PANEL
Anion gap: 8 (ref 5–15)
BUN: 18 mg/dL (ref 6–20)
CO2: 24 mmol/L (ref 22–32)
Calcium: 8.6 mg/dL — ABNORMAL LOW (ref 8.9–10.3)
Chloride: 105 mmol/L (ref 98–111)
Creatinine, Ser: 1.09 mg/dL (ref 0.61–1.24)
GFR, Estimated: >60 mL/min (ref >60)
Glucose, Bld: 129 mg/dL — ABNORMAL HIGH (ref 70–99)
Potassium: 3.8 mmol/L (ref 3.5–5.1)
Sodium: 137 mmol/L (ref 135–145)

## 2020-09-11 LAB — CBC WITH DIFFERENTIAL/PLATELET
Abs Immature Granulocytes: 0.02 10*3/uL (ref 0.00–0.07)
Basophils Absolute: 0 10*3/uL (ref 0.0–0.1)
Basophils Relative: 0 %
Eosinophils Absolute: 0.1 10*3/uL (ref 0.0–0.5)
Eosinophils Relative: 1 %
HCT: 40.3 % (ref 39.0–52.0)
Hemoglobin: 13.2 g/dL (ref 13.0–17.0)
Immature Granulocytes: 0 %
Lymphocytes Relative: 26 %
Lymphs Abs: 1.5 10*3/uL (ref 0.7–4.0)
MCH: 26.9 pg (ref 26.0–34.0)
MCHC: 32.8 g/dL (ref 30.0–36.0)
MCV: 82.1 fL (ref 80.0–100.0)
Monocytes Absolute: 0.4 10*3/uL (ref 0.1–1.0)
Monocytes Relative: 7 %
Neutro Abs: 3.8 10*3/uL (ref 1.7–7.7)
Neutrophils Relative %: 66 %
Platelets: 299 10*3/uL (ref 150–400)
RBC: 4.91 MIL/uL (ref 4.22–5.81)
RDW: 16.1 % — ABNORMAL HIGH (ref 11.5–15.5)
WBC: 5.8 10*3/uL (ref 4.0–10.5)
nRBC: 0 % (ref 0.0–0.2)

## 2020-09-11 LAB — LACTIC ACID, PLASMA: Lactic Acid, Venous: 2 mmol/L (ref 0.5–1.9)

## 2020-09-11 LAB — SEDIMENTATION RATE: Sed Rate: 10 mm/hr (ref 0–16)

## 2020-09-11 MED ORDER — SODIUM CHLORIDE 0.9 % IV BOLUS
1000.0000 mL | Freq: Once | INTRAVENOUS | Status: AC
  Administered 2020-09-11: 1000 mL via INTRAVENOUS

## 2020-09-11 MED ORDER — VANCOMYCIN HCL 500 MG IV SOLR
INTRAVENOUS | Status: AC
  Administered 2020-09-11: 500 mg
  Filled 2020-09-11: qty 500

## 2020-09-11 MED ORDER — ACETAMINOPHEN 325 MG PO TABS
650.0000 mg | ORAL_TABLET | Freq: Once | ORAL | Status: AC
  Administered 2020-09-11: 650 mg via ORAL
  Filled 2020-09-11: qty 2

## 2020-09-11 MED ORDER — VANCOMYCIN HCL 10 G IV SOLR
1500.0000 mg | Freq: Two times a day (BID) | INTRAVENOUS | Status: DC
  Filled 2020-09-11: qty 1500

## 2020-09-11 MED ORDER — VANCOMYCIN HCL 10 G IV SOLR
1500.0000 mg | Freq: Once | INTRAVENOUS | Status: DC
  Filled 2020-09-11: qty 1500

## 2020-09-11 MED ORDER — VANCOMYCIN HCL 1500 MG/300ML IV SOLN
1500.0000 mg | Freq: Once | INTRAVENOUS | Status: DC
  Filled 2020-09-11: qty 300

## 2020-09-11 MED ORDER — VANCOMYCIN HCL IN DEXTROSE 1-5 GM/200ML-% IV SOLN
1000.0000 mg | Freq: Two times a day (BID) | INTRAVENOUS | Status: DC
  Administered 2020-09-11 – 2020-09-13 (×4): 1000 mg via INTRAVENOUS
  Filled 2020-09-11 (×4): qty 200
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
## 2020-09-11 NOTE — Progress Notes (Signed)
Pharmacy Antibiotic Note  Blake Chambers is a 30 y.o. male admitted on 09/11/2020 with infection of the hand, concern for osteomyleitis.  Pharmacy has been consulted for vancomycin dosing.  Plan: Vancomycin 1500 mg IV x 1, then 1000 mg IV every 12 hours (eAUC 543, Goal AUC 400-550, SCr 1.09) Monitor renal function, clinical progression and surgery recs Vancomycin levels as needed   Height: 5\' 9"  (175.3 cm) Weight: 64.4 kg (141 lb 15.6 oz) IBW/kg (Calculated) : 70.7  Temp (24hrs), Avg:98.1 F (36.7 C), Min:98 F (36.7 C), Max:98.1 F (36.7 C)  Recent Labs  Lab 09/11/20 1929 09/11/20 2121  WBC 5.8  --   CREATININE 1.09  --   LATICACIDVEN  --  2.0*    Estimated Creatinine Clearance: 90.3 mL/min (by C-G formula based on SCr of 1.09 mg/dL).    Allergies  Allergen Reactions  . Tenofovir Disoproxil Other (See Comments)    Intolerance, increase in creatinine   (TRUVADA)     2122, PharmD Clinical Pharmacist ED Pharmacist Phone # [phone] 09/11/2020 10:09 PM
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
## 2020-09-11 NOTE — Discharge Instructions (Addendum)
Please read and follow all provided instructions.  You have been seen today for an injury to your left forearm.   Tests performed today include: An x-ray of the affected area - does NOT show any broken bones or dislocations.  Vital signs. See below for your results today.   Home care instructions: -- *PRICE in the first 24-48 hours after injury: Protect (with brace, splint, sling), if given by your provider Rest Ice- Do not apply ice pack directly to your skin, place towel or similar between your skin and ice/ice pack. Apply ice for 20 min, then remove for 40 min while awake Compression- Wear brace, elastic bandage, splint as directed by your provider Elevate affected extremity above the level of your heart when not walking around for the first 24-48 hours   Follow-up instructions: Please follow-up with your primary care provider or the provided orthopedic physician (bone specialist) if you continue to have significant pain in 1 week. In this case you may have a more severe injury that requires further care.   Return instructions:  Please return if your digits or extremity are numb or tingling, appear gray or blue, or you have severe pain (also elevate the extremity and loosen splint or wrap if you were given one) Please return if you have redness or fevers.  Please return to the Emergency Department if you experience worsening symptoms.  Please return if you have any other emergent concerns. Additional Information:  Your vital signs today were: BP 117/79 (BP Location: Right Arm)   Pulse 88   Temp 98.1 F (36.7 C) (Oral)   Resp 18   Ht 5\' 9"  (1.753 m)   Wt 64.4 kg   SpO2 98%   BMI 20.97 kg/m  If your blood pressure (BP) was elevated above 135/85 this visit, please have this repeated by your doctor within one month. ---------------

## 2020-09-11 NOTE — ED Triage Notes (Addendum)
Pt arrives c/o shortness of breath after walking all day from [place] to [facility]. Was seen at [facility] today for his left wrist. Pt tearful at arrival. Denies chest pain. States hx of anxiety. NAD during triage

## 2020-09-12 ENCOUNTER — Inpatient Hospital Stay (HOSPITAL_COMMUNITY): Payer: Medicaid Other

## 2020-09-12 DIAGNOSIS — F319 Bipolar disorder, unspecified: Secondary | ICD-10-CM | POA: Diagnosis present

## 2020-09-12 DIAGNOSIS — M79631 Pain in right forearm: Secondary | ICD-10-CM | POA: Diagnosis present

## 2020-09-12 DIAGNOSIS — M79632 Pain in left forearm: Secondary | ICD-10-CM | POA: Diagnosis present

## 2020-09-12 DIAGNOSIS — M86142 Other acute osteomyelitis, left hand: Secondary | ICD-10-CM | POA: Diagnosis present

## 2020-09-12 DIAGNOSIS — N182 Chronic kidney disease, stage 2 (mild): Secondary | ICD-10-CM | POA: Diagnosis present

## 2020-09-12 DIAGNOSIS — M86642 Other chronic osteomyelitis, left hand: Secondary | ICD-10-CM | POA: Diagnosis not present

## 2020-09-12 DIAGNOSIS — F17210 Nicotine dependence, cigarettes, uncomplicated: Secondary | ICD-10-CM | POA: Diagnosis present

## 2020-09-12 DIAGNOSIS — M19042 Primary osteoarthritis, left hand: Secondary | ICD-10-CM | POA: Insufficient documentation

## 2020-09-12 DIAGNOSIS — Z20822 Contact with and (suspected) exposure to covid-19: Secondary | ICD-10-CM | POA: Diagnosis present

## 2020-09-12 DIAGNOSIS — M869 Osteomyelitis, unspecified: Secondary | ICD-10-CM | POA: Diagnosis present

## 2020-09-12 DIAGNOSIS — B20 Human immunodeficiency virus [HIV] disease: Secondary | ICD-10-CM | POA: Diagnosis present

## 2020-09-12 DIAGNOSIS — M84445A Pathological fracture, left finger(s), initial encounter for fracture: Secondary | ICD-10-CM | POA: Diagnosis present

## 2020-09-12 DIAGNOSIS — Z888 Allergy status to other drugs, medicaments and biological substances status: Secondary | ICD-10-CM | POA: Diagnosis not present

## 2020-09-12 DIAGNOSIS — Z79899 Other long term (current) drug therapy: Secondary | ICD-10-CM | POA: Diagnosis not present

## 2020-09-12 DIAGNOSIS — A539 Syphilis, unspecified: Secondary | ICD-10-CM | POA: Diagnosis present

## 2020-09-12 DIAGNOSIS — Z21 Asymptomatic human immunodeficiency virus [HIV] infection status: Secondary | ICD-10-CM

## 2020-09-12 LAB — HEPATITIS B SURFACE ANTIGEN: Hepatitis B Surface Ag: NONREACTIVE

## 2020-09-12 LAB — LACTIC ACID, PLASMA: Lactic Acid, Venous: 1.2 mmol/L (ref 0.5–1.9)

## 2020-09-12 LAB — C-REACTIVE PROTEIN: CRP: 0.7 mg/dL (ref <1.0)

## 2020-09-12 MED ORDER — GADOBUTROL 1 MMOL/ML IV SOLN
7.0000 mL | Freq: Once | INTRAVENOUS | Status: AC | PRN
  Administered 2020-09-12: 7 mL via INTRAVENOUS

## 2020-09-12 MED ORDER — ACETAMINOPHEN 325 MG PO TABS: 650.0000 mg | ORAL_TABLET | Freq: Four times a day (QID) | ORAL | Status: DC | PRN

## 2020-09-12 MED ORDER — ZOLPIDEM TARTRATE 5 MG PO TABS: 5.0000 mg | ORAL_TABLET | Freq: Every evening | ORAL | Status: DC | PRN

## 2020-09-12 MED ORDER — SODIUM CHLORIDE 0.9% FLUSH
3.0000 mL | Freq: Two times a day (BID) | INTRAVENOUS | Status: DC
  Administered 2020-09-12 (×2): 3 mL via INTRAVENOUS

## 2020-09-12 MED ORDER — KETOROLAC TROMETHAMINE 30 MG/ML IJ SOLN: 30.0000 mg | Freq: Four times a day (QID) | INTRAMUSCULAR | Status: DC | PRN

## 2020-09-12 MED ORDER — SODIUM CHLORIDE 0.9% FLUSH: 3.0000 mL | INTRAVENOUS | Status: DC | PRN

## 2020-09-12 MED ORDER — ACETAMINOPHEN 650 MG RE SUPP: 650.0000 mg | Freq: Four times a day (QID) | RECTAL | Status: DC | PRN

## 2020-09-12 MED ORDER — SODIUM CHLORIDE 0.9 % IV SOLN
250.0000 mL | INTRAVENOUS | Status: DC | PRN
  Administered 2020-09-12 (×2): 250 mL via INTRAVENOUS

## 2020-09-12 MED ORDER — ENOXAPARIN SODIUM 40 MG/0.4ML ~~LOC~~ SOLN
40.0000 mg | SUBCUTANEOUS | Status: DC
  Administered 2020-09-12 – 2020-09-13 (×2): 40 mg via SUBCUTANEOUS
  Filled 2020-09-12 (×2): qty 0.4
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
## 2020-09-12 NOTE — Progress Notes (Signed)
Spoke with EDP last night regarding this patient, but was unaware he was ultimately admitted until contacted this AM by Dr. Irene Limbo.  This patient has multiple complicating medical problems and social factors.  He also has chronic osteomyelitis of his finger.  Treatment strategies include chronic medical management with antibiotic suppression, etc with goal to keep digit and co-exist with the infection as it is not curable versus amputation of the digit.  At any point should amputation be sought as the preferred option acceptable to the patient, I'm available to provide such treatment.  Neil Crouch, MD Hand Surgery

## 2020-09-12 NOTE — Hospital Course (Signed)
30 year old man PMH HIV not on treatment, chronic left fifth finger pain for at least a year, presented with worsening pain.  Admitted for osteomyelitis left fifth finger.  A & P  Osteomyelitis chronic left fifth finger --Patient does not want amputation at this point.  Per ID plan oral therapy with doxycycline Ancef Tylenol, initiate treatment for HIV --Follow-up with infectious disease as an outpatient  HIV --Follow-up CD4 count, viral load, close follow-up with infectious disease as an outpatient  PMH syphilis --Follow-up RPR, hepatitis serology, Cortaren gold, GC chlamydia as per infectious disease  Right forearm pain.  ID considering disseminated gonorrhea.

## 2020-09-12 NOTE — Progress Notes (Signed)
PROGRESS NOTE  Blake Chambers QQI:[id number] DOB: 19-Nov-1990 DOA: 09/11/2020 PCP: [facility]  Brief History   30 year old man PMH HIV not on treatment, chronic left fifth finger pain for at least a year, presented with worsening pain.  Admitted for osteomyelitis left fifth finger.  A & P  Osteomyelitis chronic left fifth finger --Patient does not want amputation at this point.  Per ID plan oral therapy with doxycycline Ancef Tylenol, initiate treatment for HIV --Follow-up with infectious disease as an outpatient  HIV --Follow-up CD4 count, viral load, close follow-up with infectious disease as an outpatient  PMH syphilis --Follow-up RPR, hepatitis serology, Cortaren gold, GC chlamydia as per infectious disease  Right forearm pain.  ID considering disseminated gonorrhea.   Disposition Plan:  Discussion:   Status is: Inpatient  Remains inpatient appropriate because:Inpatient level of care appropriate due to severity of illness   Dispo: The patient is from: Home              Anticipated d/c is to: Home              Patient currently is not medically stable to d/c.   Difficult to place patient No       DVT prophylaxis: enoxaparin (LOVENOX) injection 40 mg Start: 09/12/20 1000     Code Status: Full Code Level of care: Med-Surg Family Communication: none  Brendia Sacks, MD  [facility] Direct contact: see [email or web address] (further directions at bottom of note if needed) 7PM-7AM contact night coverage as at bottom of note 09/12/2020, 5:20 PM  LOS: 0 days   Consults:  . ID . Hand (informal)   Procedures:  .   Significant Diagnostic Tests:  Marland Kitchen    Micro Data:  .    Antimicrobials:  .   Interval History/Subjective  CC: f/u infection  Left 5th finger feels a little better.  Objective   Vitals:  Vitals:   09/12/20 0915 09/12/20 1255  BP: 123/83 (!) 130/93  Pulse: 74 76  Resp: 20 18  Temp: 97.8 F (36.6 C) 98.8 F (37.1  C)  SpO2: 100% 99%    Exam:  Constitutional:   . Appears calm and comfortable ENMT:  . grossly normal hearing  Respiratory:  . CTA bilaterally, no w/r/r.  . Respiratory effort normal. Cardiovascular:  . RRR, no m/r/g Musculoskeletal:  . L 5th finger edematous, flexed Psychiatric:  . Mental status o Mood, affect appropriate  I have personally reviewed the following:   Today's Data  . BMP unremarkable . CBC unremarkable . X-rays noted.  MRI pending.  Scheduled Meds: . enoxaparin (LOVENOX) injection  40 mg Subcutaneous Q24H  . sodium chloride flush  3 mL Intravenous Q12H   Continuous Infusions: . sodium chloride 250 mL (09/12/20 0950)  . vancomycin (VANCOCIN) 1500 mg/571mL IVPB (compounded)    . vancomycin 1,000 mg (09/12/20 0953)    Principal Problem:   Finger osteomyelitis, left (HCC) Active Problems:   Bipolar I disorder (HCC)   HIV disease (HCC)   Pain of left forearm   LOS: 0 days   How to contact the [facility] Attending or Consulting provider 7A - 7P or covering provider during after hours 7P -7A, for this patient?  1. Check the care team in [facility] and look for a) attending/consulting [facility] provider listed and b) the [facility] team listed 2. Log into [email or web address] and use [place]'s universal password to access. If you do not have the password, please contact the hospital operator. 3.  Locate the [facility] provider you are looking for under [facility] and page to a number that you can be directly reached. 4. If you still have difficulty reaching the provider, please page the [facility] (Director on Call) for the Hospitalists listed on amion for assistance.
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
## 2020-09-13 ENCOUNTER — Telehealth: Payer: Self-pay | Admitting: Internal Medicine

## 2020-09-13 LAB — HEPATITIS B CORE ANTIBODY, TOTAL: Hep B Core Total Ab: NONREACTIVE

## 2020-09-13 LAB — HCV INTERPRETATION

## 2020-09-13 LAB — CREATININE, SERUM
Creatinine, Ser: 0.87 mg/dL (ref 0.61–1.24)
GFR, Estimated: >60 mL/min (ref >60)

## 2020-09-13 LAB — HCV AB W REFLEX TO QUANT PCR: HCV Ab: 0.1 s/co ratio (ref 0.0–0.9)

## 2020-09-13 LAB — RPR
RPR Ser Ql: REACTIVE — AB
RPR Titer: 1:32 {titer}

## 2020-09-13 LAB — T-HELPER CELLS (CD4) COUNT (NOT AT ARMC)
CD4 % Helper T Cell: 33 % (ref 33–65)
CD4 T Cell Abs: 555 /uL (ref 400–1790)

## 2020-09-13 LAB — HEPATITIS B SURFACE ANTIBODY, QUANTITATIVE: Hep B S AB Quant (Post): 114.2 m[IU]/mL (ref >9.9)

## 2020-09-13 NOTE — Telephone Encounter (Signed)
Attempted to call patient to schedule with Dr. Luciana Axe, no answer. Will send MyChart message.   Sandie Ano, RN

## 2020-09-14 ENCOUNTER — Encounter (HOSPITAL_COMMUNITY): Payer: Self-pay | Admitting: Internal Medicine

## 2020-09-14 LAB — T.PALLIDUM AB, TOTAL: T Pallidum Abs: REACTIVE — AB
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
## 2020-09-16 LAB — CULTURE, BLOOD (ROUTINE X 2)
Culture: NO GROWTH
Culture: NO GROWTH
Special Requests: ADEQUATE
Special Requests: ADEQUATE

## 2020-09-18 NOTE — Telephone Encounter (Signed)
Received call from Mathis Fare at [facility] requesting information on patient's RPR titer and treatment history. RN relayed to Mathis Fare that our office has been unsuccessful in reaching patient for follow-up. Requested that Mathis Fare refer patient back to our office when he makes contact with patient.   Sandie Ano, RN

## 2020-09-18 NOTE — Telephone Encounter (Signed)
Attempted to call patient again, no answer. Patient does not have secure voicemail set up.   Sandie Ano, RN

## 2020-09-26 ENCOUNTER — Emergency Department (HOSPITAL_COMMUNITY)
Admission: EM | Admit: 2020-09-26 | Discharge: 2020-09-26 | Disposition: A | Discharge status: Home / Self Care | Payer: Medicaid Other | Attending: Emergency Medicine | Admitting: Emergency Medicine

## 2020-09-26 ENCOUNTER — Encounter (HOSPITAL_COMMUNITY): Payer: Self-pay

## 2020-09-26 ENCOUNTER — Other Ambulatory Visit: Payer: Self-pay

## 2020-09-26 ENCOUNTER — Emergency Department (HOSPITAL_COMMUNITY): Payer: Medicaid Other

## 2020-09-26 DIAGNOSIS — Z21 Asymptomatic human immunodeficiency virus [HIV] infection status: Secondary | ICD-10-CM | POA: Diagnosis not present

## 2020-09-26 DIAGNOSIS — M79672 Pain in left foot: Secondary | ICD-10-CM | POA: Diagnosis not present

## 2020-09-26 DIAGNOSIS — F17210 Nicotine dependence, cigarettes, uncomplicated: Secondary | ICD-10-CM | POA: Diagnosis not present

## 2020-09-26 NOTE — ED Provider Notes (Signed)
[name] EMERGENCY DEPARTMENT Provider Note   CSN: [id number] Arrival date & time: 09/26/20  0014     History Chief Complaint  Patient presents with  . Foot Pain    Blake Chambers is a 30 y.o. male.  Patient is a 30 year old male with past medical history of HIV disease, bipolar, osteomyelitis of the finger of the hand.  Patient presenting today for evaluation of left foot pain.  This began yesterday in the absence of any injury or trauma.  He denies any fevers or chills.  The history is provided by the patient.  Foot Pain This is a new problem. The current episode started yesterday. The problem occurs constantly. The problem has been gradually worsening. The symptoms are aggravated by walking. Nothing relieves the symptoms.       Past Medical History:  Diagnosis Date  . Anxiety   . Bipolar affective (HCC)   . HIV (human immunodeficiency virus infection) (HCC)   . Immune deficiency disorder (HCC)   . Mental health disorder   . 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 Syphilis     Patient Active Problem List   Diagnosis Date Noted  . 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 Pain of left forearm   . Finger osteomyelitis, left (HCC) 09/11/2020  . Family discord 08/13/2020  . Perirectal cellulitis 11/01/2017  . Abscess 08/20/2017  . Perirectal abscess 08/20/2017  . Encounter for long-term (current) use of high-risk medication 02/24/2017  . Screening examination for venereal disease 10/21/2016  . Cocaine use disorder, moderate, dependence (HCC) 10/23/2014  . Cannabis use disorder, severe, dependence (HCC) 10/23/2014  . Bipolar I disorder, severe, current or most recent episode depressed, with mixed features (HCC) 10/21/2014  . Frostbite of both hands 08/01/2014  . Tobacco use disorder 07/18/2013  . Chronic renal insufficiency, stage II (mild) 11/30/2012  . HIV disease (HCC) 02/13/2011  . Syphilis 02/13/2011  . Bipolar I disorder  (HCC) 02/10/2011    Past Surgical History:  Procedure Laterality Date  . IRRIGATION AND DEBRIDEMENT ABSCESS N/A 08/20/2017   Procedure: IRRIGATION AND DEBRIDEMENT PERI RECTAL ABSCESS;  Surgeon: Andria Meuse, MD;  Location: [facility] OR;  Service: General;  Laterality: N/A;       Family History  Family history unknown: Yes    Social History   Tobacco Use  . Smoking status: Current Every Day Smoker    Packs/day: 0.30    Types: Cigarettes  . Smokeless tobacco: Never Used  . Tobacco comment: pt. trying to quit  Vaping Use  . Vaping Use: Never used  Substance Use Topics  . Alcohol use: No    Alcohol/week: 1.0 standard drink    Types: 1 Standard drinks or equivalent per week  . Drug use: Not Currently    Frequency: 1.0 times per week    Types: Marijuana, Cocaine    Home Medications Prior to Admission medications   Medication Sig Start Date End Date Taking? Authorizing Provider  darunavir-cobicistat (PREZCOBIX) 800-150 MG tablet Take 1 tablet by mouth daily with supper. Patient not taking: Reported on 09/12/2020 08/26/16   Gardiner Barefoot, MD  emtricitabine-tenofovir AF (DESCOVY) 200-25 MG tablet Take 1 tablet by mouth daily. Patient not taking: No sig reported 08/26/16   Gardiner Barefoot, MD    Allergies    Tenofovir disoproxil  Review of Systems   Review of Systems  All other systems reviewed and are negative.   Physical Exam Updated Vital Signs BP  110/82 (BP Location: Right Arm)   Pulse 81   Temp 98.1 F (36.7 C) (Oral)   Resp 18   Ht 5\' 9"  (1.753 m)   Wt 64.4 kg   SpO2 98%   BMI 20.97 kg/m   Physical Exam Vitals and nursing note reviewed.  Constitutional:      General: He is not in acute distress.    Appearance: Normal appearance. He is not ill-appearing, toxic-appearing or diaphoretic.  HENT:     Head: Normocephalic and atraumatic.  Pulmonary:     Effort: Pulmonary effort is normal.  Musculoskeletal:     Comments: The left foot is grossly normal in  appearance.  There is no redness, warmth, or erythema.  Exam is somewhat limited secondary to the patient's somnolence.  Skin:    General: Skin is warm and dry.  Neurological:     Mental Status: He is alert.     ED Results / Procedures / Treatments   Labs (all labs ordered are listed, but only abnormal results are displayed) Labs Reviewed - No data to display  EKG None  Radiology DG Foot Complete Left  Result Date: 09/26/2020 CLINICAL DATA:  Left foot pain and cramping. EXAM: LEFT FOOT - COMPLETE 3+ VIEW COMPARISON:  None. FINDINGS: There is no evidence of fracture or dislocation. There is no evidence of arthropathy or other focal bone abnormality. Soft tissues are unremarkable. IMPRESSION: Negative. Electronically Signed   By: 09/28/2020 M.D.   On: 09/26/2020 02:06    Procedures Procedures   Medications Ordered in ED Medications - No data to display  ED Course  I have reviewed the triage vital signs and the nursing notes.  Pertinent labs & imaging results that were available during my care of the patient were reviewed by me and considered in my medical decision making (see chart for details).    MDM Rules/Calculators/A&P  X-rays are negative for fracture or other abnormality.  Patient seems appropriate for discharge with as needed return.  Final Clinical Impression(s) / ED Diagnoses Final diagnoses:  None    Rx / DC Orders ED Discharge Orders    None       09/28/2020, MD 09/26/20 [id number]

## 2020-09-26 NOTE — Discharge Instructions (Addendum)
Take ibuprofen 600 mg every 6 hours as needed for pain.  Rest.  Follow-up with primary doctor if not improving in the next week. 

## 2020-09-26 NOTE — ED Notes (Signed)
Discharge instructions discussed with pt. Pt verbalized understanding. Pt stable and ambulatory. Nos signature pad available 

## 2020-09-26 NOTE — ED Triage Notes (Signed)
Emergency Medicine Provider Triage Evaluation Note  Blake Chambers , a 30 y.o. male  was evaluated in triage.  Pt complains of left foot pain.  The patient is endorsing left foot pain and cramping, onset last night.  He denies numbness or weakness.  No fevers or chills.  No new trauma or injuries.  Patient had recent admission for osteomyelitis of the hand and left AMA.  Review of Systems  Positive: Myalgias, arthralgias Negative: Numbness, weakness, fever, chills  Physical Exam  BP 110/82 (BP Location: Right Arm)   Pulse 81   Temp 98.1 F (36.7 C) (Oral)   Resp 18   Ht 5\' 9"  (1.753 m)   Wt 64.4 kg   SpO2 98%   BMI 20.97 kg/m  Gen:   Awake, no distress   HEENT:  Atraumatic  Resp:  Normal effort  Cardiac:  Normal rate Abd:   Nondistended, nontender  MSK:   Moves extremities without difficulty.  Peripheral pulses are 2+ and intact.  Good capillary refill.  Full active and passive range of motion of the bilateral ankles.  No erythema, warmth, or edema noted to the bilateral lower extremities. Neuro:  Speech clear.  Sensation is intact and equal to the bilateral lower extremities.  Independently moves all digits of the bilateral feet.  Ambulates independently.  Medical Decision Making  Medically screening exam initiated at 1:21 AM.  Appropriate orders placed.  Ronney Honeywell was informed that the remainder of the evaluation will be completed by another provider, this initial triage assessment does not replace that evaluation, and the importance of remaining in the ED until their evaluation is complete.  Clinical Impression  Patient presenting with left foot pain and cramping.  He is neurovascularly intact on exam.  He has a history of HIV and did have recent admission for osteomyelitis of the hands, but left AMA.  Given his history of osteomyelitis with no previous imaging, will order imaging of the left foot.  Patient will require further evaluation in the emergency department.    Naoma Diener A, PA-C 09/26/20 0121

## 2020-09-26 NOTE — ED Triage Notes (Signed)
Pt bib [facility]. Pt c/o left foot cramping that started last night. Pt denies injury.

## 2020-10-02 ENCOUNTER — Other Ambulatory Visit: Payer: Self-pay

## 2020-10-02 ENCOUNTER — Encounter (HOSPITAL_COMMUNITY): Payer: Self-pay

## 2020-10-02 ENCOUNTER — Emergency Department (HOSPITAL_COMMUNITY)
Admission: EM | Admit: 2020-10-02 | Discharge: 2020-10-03 | Disposition: A | Discharge status: Home / Self Care | Payer: Medicaid Other | Attending: Emergency Medicine | Admitting: Emergency Medicine

## 2020-10-02 DIAGNOSIS — M79672 Pain in left foot: Secondary | ICD-10-CM

## 2020-10-02 DIAGNOSIS — F17210 Nicotine dependence, cigarettes, uncomplicated: Secondary | ICD-10-CM | POA: Diagnosis not present

## 2020-10-02 DIAGNOSIS — G8929 Other chronic pain: Secondary | ICD-10-CM | POA: Insufficient documentation

## 2020-10-02 DIAGNOSIS — B353 Tinea pedis: Secondary | ICD-10-CM | POA: Diagnosis not present

## 2020-10-02 DIAGNOSIS — Z21 Asymptomatic human immunodeficiency virus [HIV] infection status: Secondary | ICD-10-CM | POA: Diagnosis not present

## 2020-10-02 DIAGNOSIS — Y9301 Activity, walking, marching and hiking: Secondary | ICD-10-CM | POA: Insufficient documentation

## 2020-10-02 DIAGNOSIS — M79671 Pain in right foot: Secondary | ICD-10-CM | POA: Diagnosis present

## 2020-10-02 LAB — HIV-1 RNA, PCR (GRAPH) RFX/GENO EDI
HIV-1 RNA BY PCR: 14300 copies/mL
HIV-1 RNA Quant, Log: 4.155 log10copy/mL

## 2020-10-02 LAB — REFLEX TO GENOSURE(R) MG EDI

## 2020-10-02 NOTE — ED Triage Notes (Signed)
Emergency Medicine Provider Triage Evaluation Note  Deondrick Searls , a 30 y.o. male  was evaluated in triage.  Pt complains of foot pain, atraumatic. Was seen here 4 days for same, plain films negative. Homeless. Ambulatory.   Review of Systems  Positive: L foot pain Negative: atraumatic   Physical Exam  There were no vitals taken for this visit. Gen:   Awake, no distress   HEENT:  Atraumatic  Resp:  Normal effort  Cardiac:  Normal rate  Abd:   Nondistended, nontender  MSK:   L foot with TTP, no erythema, warmth. PT 2+ Neuro:  Speech clear   Medical Decision Making  Medically screening exam initiated at 7:26 PM.  Appropriate orders placed.  Basim Bartnik was informed that the remainder of the evaluation will be completed by another provider, this initial triage assessment does not replace that evaluation, and the importance of remaining in the ED until their evaluation is complete.  Clinical Impression  MSE was initiated and I personally evaluated the patient and placed orders (if any) at  7:27 PM on October 02, 2020.  The patient appears stable so that the remainder of the MSE may be completed by another provider.    Farrel Gordon, PA-C 10/02/20 1930

## 2020-10-02 NOTE — ED Triage Notes (Signed)
Brought in by [facility] c/o bilateral foot pain (non traumatic) from increased walking. Pt is homeless. Able to ambulate by himself.

## 2020-10-03 MED ORDER — CLOTRIMAZOLE 1 % EX CREA: TOPICAL_CREAM | CUTANEOUS | 0 refills | Status: DC

## 2020-10-03 NOTE — ED Provider Notes (Signed)
[facility] EMERGENCY DEPARTMENT Provider Note  CSN: [id number] Arrival date & time: 10/02/20 1928  Chief Complaint(s) Foot Pain  HPI Blake Chambers is a 30 y.o. male here for chronic bilateral foot aching pain worse after walking long distance. No trauma. No swelling.   HPI  Past Medical History Past Medical History:  Diagnosis Date  . Anxiety   . Bipolar affective (HCC)   . HIV (human immunodeficiency virus infection) (HCC)   . Immune deficiency disorder (HCC)   . Mental health disorder   . 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 Syphilis    Patient Active Problem List   Diagnosis Date Noted  . 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 Pain of left forearm   . Finger osteomyelitis, left (HCC) 09/11/2020  . Family discord 08/13/2020  . Perirectal cellulitis 11/01/2017  . Abscess 08/20/2017  . Perirectal abscess 08/20/2017  . Encounter for long-term (current) use of high-risk medication 02/24/2017  . Screening examination for venereal disease 10/21/2016  . Cocaine use disorder, moderate, dependence (HCC) 10/23/2014  . Cannabis use disorder, severe, dependence (HCC) 10/23/2014  . Bipolar I disorder, severe, current or most recent episode depressed, with mixed features (HCC) 10/21/2014  . Frostbite of both hands 08/01/2014  . Tobacco use disorder 07/18/2013  . Chronic renal insufficiency, stage II (mild) 11/30/2012  . HIV disease (HCC) 02/13/2011  . Syphilis 02/13/2011  . Bipolar I disorder (HCC) 02/10/2011   Home Medication(s) Prior to Admission medications   Medication Sig Start Date End Date Taking? Authorizing Provider  clotrimazole (LOTRIMIN) 1 % cream Apply to affected area 2 times daily 10/03/20  Yes Edmundo Tedesco, Amadeo Garnet, MD  darunavir-cobicistat (PREZCOBIX) 800-150 MG tablet Take 1 tablet by mouth daily with supper. Patient not taking: Reported on 09/12/2020 08/26/16   Gardiner Barefoot, MD  emtricitabine-tenofovir AF (DESCOVY)  200-25 MG tablet Take 1 tablet by mouth daily. Patient not taking: No sig reported 08/26/16   Gardiner Barefoot, MD                                                                                                                                    Past Surgical History Past Surgical History:  Procedure Laterality Date  . IRRIGATION AND DEBRIDEMENT ABSCESS N/A 08/20/2017   Procedure: IRRIGATION AND DEBRIDEMENT PERI RECTAL ABSCESS;  Surgeon: Andria Meuse, MD;  Location: [facility] OR;  Service: General;  Laterality: N/A;   Family History Family History  Family history unknown: Yes    Social History Social History   Tobacco Use  . Smoking status: Current Every Day Smoker    Packs/day: 0.30    Types: Cigarettes  . Smokeless tobacco: Never Used  . Tobacco comment: pt. trying to quit  Vaping Use  . Vaping Use: Never used  Substance Use Topics  . Alcohol use: No    Alcohol/week: 1.0 standard drink  Types: 1 Standard drinks or equivalent per week  . Drug use: Not Currently    Frequency: 1.0 times per week    Types: Marijuana, Cocaine   Allergies Tenofovir disoproxil  Review of Systems Review of Systems All other systems are reviewed and are negative for acute change except as noted in the HPI  Physical Exam Vital Signs  I have reviewed the triage vital signs BP 114/79 (BP Location: Left Arm)   Pulse 75   Temp 97.8 F (36.6 C) (Oral)   Resp 18   Ht 5\' 9"  (1.753 m)   Wt 64.4 kg   SpO2 99%   BMI 20.97 kg/m   Physical Exam Vitals reviewed.  Constitutional:      General: He is not in acute distress.    Appearance: He is well-developed. He is not diaphoretic.  HENT:     Head: Normocephalic and atraumatic.     Jaw: No trismus.     Right Ear: External ear normal.     Left Ear: External ear normal.     Nose: Nose normal.  Eyes:     General: No scleral icterus.    Conjunctiva/sclera: Conjunctivae normal.  Neck:     Trachea: Phonation normal.  Cardiovascular:     Rate  and Rhythm: Normal rate and regular rhythm.  Pulmonary:     Effort: Pulmonary effort is normal. No respiratory distress.     Breath sounds: No stridor.  Abdominal:     General: There is no distension.  Musculoskeletal:        General: Normal range of motion.     Cervical back: Normal range of motion.  Feet:     Right foot:     Skin integrity: Dry skin present. No ulcer, blister, erythema, warmth or fissure.     Toenail Condition: Fungal disease present.    Left foot:     Skin integrity: Dry skin present. No ulcer, blister, erythema, warmth or fissure.     Toenail Condition: Fungal disease present. Neurological:     Mental Status: He is alert and oriented to person, place, and time.  Psychiatric:        Behavior: Behavior normal.     ED Results and Treatments Labs (all labs ordered are listed, but only abnormal results are displayed) Labs Reviewed - No data to display                                                                                                                       EKG  EKG Interpretation  Date/Time:    Ventricular Rate:    PR Interval:    QRS Duration:   QT Interval:    QTC Calculation:   R Axis:     Text Interpretation:        Radiology No results found.  Pertinent labs & imaging results that were available during my care of the patient were reviewed by me and considered in my medical decision making (see chart for  details).  Medications Ordered in ED Medications - No data to display                                                                                                                                  Procedures Procedures  (including critical care time)  Medical Decision Making / ED Course I have reviewed the nursing notes for this encounter and the patient's prior records (if available in EHR or on provided paperwork).   Blake Chambers was evaluated in Emergency Department on 10/04/2020 for the symptoms described in the history of  present illness. He was evaluated in the context of the global COVID-19 pandemic, which necessitated consideration that the patient might be at risk for infection with the SARS-CoV-2 virus that causes COVID-19. Institutional protocols and algorithms that pertain to the evaluation of patients at risk for COVID-19 are in a state of rapid change based on information released by regulatory bodies including the CDC and federal and state organizations. These policies and algorithms were followed during the patient's care in the ED.  Chronic bilateral foot pain. No trauma or evidence of injury on exam No imaging needed. Patient has tinea pedis. Antifungal cream recommended.      Final Clinical Impression(s) / ED Diagnoses Final diagnoses:  Tinea pedis of both feet  Foot pain, right  Foot pain, left   The patient appears reasonably screened and/or stabilized for discharge and I doubt any other medical condition or other [facility] requiring further screening, evaluation, or treatment in the ED at this time prior to discharge. Safe for discharge with strict return precautions.  Disposition: Discharge  Condition: Good  I have discussed the results, Dx and Tx plan with the patient/family who expressed understanding and agree(s) with the plan. Discharge instructions discussed at length. The patient/family was given strict return precautions who verbalized understanding of the instructions. No further questions at time of discharge.    ED Discharge Orders         Ordered    clotrimazole (LOTRIMIN) 1 % cream        10/03/20 0341            Follow Up: [facility] [address] [place] [address] [phone]  Call  as needed      This chart was dictated using voice recognition software.  Despite best efforts to proofread,  errors can occur which can change the documentation meaning.   Nira Conn, MD 10/04/20 [phone]

## 2020-10-11 ENCOUNTER — Other Ambulatory Visit: Payer: Self-pay

## 2020-10-11 ENCOUNTER — Emergency Department (HOSPITAL_COMMUNITY)
Admission: EM | Admit: 2020-10-11 | Discharge: 2020-10-11 | Disposition: A | Discharge status: Home / Self Care | Payer: Medicaid Other | Attending: Emergency Medicine | Admitting: Emergency Medicine

## 2020-10-11 DIAGNOSIS — R45851 Suicidal ideations: Secondary | ICD-10-CM | POA: Diagnosis not present

## 2020-10-11 DIAGNOSIS — M79604 Pain in right leg: Secondary | ICD-10-CM | POA: Insufficient documentation

## 2020-10-11 DIAGNOSIS — Z20822 Contact with and (suspected) exposure to covid-19: Secondary | ICD-10-CM | POA: Diagnosis not present

## 2020-10-11 DIAGNOSIS — F29 Unspecified psychosis not due to a substance or known physiological condition: Secondary | ICD-10-CM | POA: Insufficient documentation

## 2020-10-11 DIAGNOSIS — M79605 Pain in left leg: Secondary | ICD-10-CM | POA: Insufficient documentation

## 2020-10-11 DIAGNOSIS — F17210 Nicotine dependence, cigarettes, uncomplicated: Secondary | ICD-10-CM | POA: Insufficient documentation

## 2020-10-11 DIAGNOSIS — Z21 Asymptomatic human immunodeficiency virus [HIV] infection status: Secondary | ICD-10-CM | POA: Insufficient documentation

## 2020-10-11 DIAGNOSIS — Y9301 Activity, walking, marching and hiking: Secondary | ICD-10-CM | POA: Diagnosis not present

## 2020-10-11 LAB — CBC WITH DIFFERENTIAL/PLATELET
Abs Immature Granulocytes: 0.01 K/uL (ref 0.00–0.07)
Basophils Absolute: 0 K/uL (ref 0.0–0.1)
Basophils Relative: 1 %
Eosinophils Absolute: 0.1 K/uL (ref 0.0–0.5)
Eosinophils Relative: 2 %
HCT: 41.1 % (ref 39.0–52.0)
Hemoglobin: 12.9 g/dL — ABNORMAL LOW (ref 13.0–17.0)
Immature Granulocytes: 0 %
Lymphocytes Relative: 29 %
Lymphs Abs: 1.4 K/uL (ref 0.7–4.0)
MCH: 26.3 pg (ref 26.0–34.0)
MCHC: 31.4 g/dL (ref 30.0–36.0)
MCV: 83.9 fL (ref 80.0–100.0)
Monocytes Absolute: 0.6 K/uL (ref 0.1–1.0)
Monocytes Relative: 12 %
Neutro Abs: 2.8 K/uL (ref 1.7–7.7)
Neutrophils Relative %: 56 %
Platelets: 269 K/uL (ref 150–400)
RBC: 4.9 MIL/uL (ref 4.22–5.81)
RDW: 16 % — ABNORMAL HIGH (ref 11.5–15.5)
WBC: 4.9 K/uL (ref 4.0–10.5)
nRBC: 0 % (ref 0.0–0.2)

## 2020-10-11 LAB — COMPREHENSIVE METABOLIC PANEL
ALT: 29 U/L (ref 0–44)
AST: 32 U/L (ref 15–41)
Albumin: 3.3 g/dL — ABNORMAL LOW (ref 3.5–5.0)
Alkaline Phosphatase: 68 U/L (ref 38–126)
Anion gap: 8 (ref 5–15)
BUN: 12 mg/dL (ref 6–20)
CO2: 23 mmol/L (ref 22–32)
Calcium: 8.7 mg/dL — ABNORMAL LOW (ref 8.9–10.3)
Chloride: 105 mmol/L (ref 98–111)
Creatinine, Ser: 1.07 mg/dL (ref 0.61–1.24)
GFR, Estimated: >60 mL/min (ref >60)
Glucose, Bld: 82 mg/dL (ref 70–99)
Potassium: 4 mmol/L (ref 3.5–5.1)
Sodium: 136 mmol/L (ref 135–145)
Total Bilirubin: 0.6 mg/dL (ref 0.3–1.2)
Total Protein: 7.5 g/dL (ref 6.5–8.1)

## 2020-10-11 LAB — ETHANOL: Alcohol, Ethyl (B): <10 mg/dL

## 2020-10-11 LAB — RESP PANEL BY RT-PCR (FLU A&B, COVID) ARPGX2
Influenza A by PCR: NEGATIVE
Influenza B by PCR: NEGATIVE
SARS Coronavirus 2 by RT PCR: NEGATIVE

## 2020-10-11 NOTE — ED Notes (Signed)
Pt reporting wanting to leave, pt educated for need of TTS, Continues to insist to leave. Friend at bedside. Pt denies si/hi, Dr Hyacinth Meeker notified

## 2020-10-11 NOTE — ED Triage Notes (Signed)
Pt BIB [facility] accompanied by his friend after being found sitting in the street after walking around [place] for about 4 days. Pt is complaining of bilateral leg pain rating pain 8 out of 10. Patient is just feeling generally tired from walking with is friend with heavy bags over the course of 4 days. Pt's friend verbalizes they were walking to find a new place to settle down because both of them are coming from unsafe living situations. The pt does have a hx of bipolar disease and depression along with meth use 2 days ago. Pt does not verbalize much when asking questions, only nodding head yes or no to questions.

## 2020-10-11 NOTE — BH Assessment (Signed)
13:32 pm TTS attempted to complete consult per Nurse patient in hallway and machine is in use right now . Will try later to complete.
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
## 2020-11-06 ENCOUNTER — Emergency Department (HOSPITAL_COMMUNITY)
Admission: EM | Admit: 2020-11-06 | Discharge: 2020-11-06 | Disposition: A | Discharge status: Home / Self Care | Payer: Medicaid Other | Attending: Emergency Medicine | Admitting: Emergency Medicine

## 2020-11-06 ENCOUNTER — Emergency Department (HOSPITAL_COMMUNITY): Payer: Medicaid Other

## 2020-11-06 ENCOUNTER — Encounter (HOSPITAL_COMMUNITY): Payer: Self-pay

## 2020-11-06 DIAGNOSIS — M79645 Pain in left finger(s): Secondary | ICD-10-CM | POA: Diagnosis present

## 2020-11-06 DIAGNOSIS — M79646 Pain in unspecified finger(s): Secondary | ICD-10-CM

## 2020-11-06 DIAGNOSIS — F17210 Nicotine dependence, cigarettes, uncomplicated: Secondary | ICD-10-CM | POA: Diagnosis not present

## 2020-11-06 DIAGNOSIS — Z21 Asymptomatic human immunodeficiency virus [HIV] infection status: Secondary | ICD-10-CM | POA: Insufficient documentation

## 2020-11-06 DIAGNOSIS — M129 Arthropathy, unspecified: Secondary | ICD-10-CM

## 2020-11-06 DIAGNOSIS — N182 Chronic kidney disease, stage 2 (mild): Secondary | ICD-10-CM | POA: Insufficient documentation

## 2020-11-06 NOTE — ED Triage Notes (Signed)
Pt states he is having trouble moving his left pinky finger. States he injured it several weeks ago and feels like it has not gotten better.

## 2020-11-06 NOTE — Discharge Instructions (Addendum)
Your x-rays are similar in appearance to x-rays obtained during your last visit.  Follow-up with Dr. Orlan Leavens in the surgery clinic in the next week.  His contact information has been provided in this discharge summary for you to call and make these arrangements.
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
## 2020-11-08 ENCOUNTER — Emergency Department (HOSPITAL_COMMUNITY)
Admission: EM | Admit: 2020-11-08 | Discharge: 2020-11-08 | Disposition: A | Discharge status: Home / Self Care | Payer: Medicaid Other | Attending: Emergency Medicine | Admitting: Emergency Medicine

## 2020-11-08 ENCOUNTER — Encounter (HOSPITAL_COMMUNITY): Payer: Self-pay | Admitting: *Deleted

## 2020-11-08 ENCOUNTER — Other Ambulatory Visit: Payer: Self-pay

## 2020-11-08 DIAGNOSIS — R220 Localized swelling, mass and lump, head: Secondary | ICD-10-CM | POA: Diagnosis present

## 2020-11-08 DIAGNOSIS — Z21 Asymptomatic human immunodeficiency virus [HIV] infection status: Secondary | ICD-10-CM | POA: Insufficient documentation

## 2020-11-08 DIAGNOSIS — F17210 Nicotine dependence, cigarettes, uncomplicated: Secondary | ICD-10-CM | POA: Diagnosis not present

## 2020-11-08 DIAGNOSIS — R590 Localized enlarged lymph nodes: Secondary | ICD-10-CM | POA: Insufficient documentation

## 2020-11-08 DIAGNOSIS — L0201 Cutaneous abscess of face: Secondary | ICD-10-CM

## 2020-11-08 DIAGNOSIS — R234 Changes in skin texture: Secondary | ICD-10-CM | POA: Insufficient documentation

## 2020-11-08 MED ORDER — SULFAMETHOXAZOLE-TRIMETHOPRIM 800-160 MG PO TABS: 1.0000 | ORAL_TABLET | Freq: Two times a day (BID) | ORAL | 0 refills | Status: AC

## 2020-11-08 MED ORDER — SULFAMETHOXAZOLE-TRIMETHOPRIM 800-160 MG PO TABS
1.0000 | ORAL_TABLET | Freq: Once | ORAL | Status: AC
  Administered 2020-11-08: 1 via ORAL
  Filled 2020-11-08: qty 1

## 2020-11-08 MED ORDER — IBUPROFEN 600 MG PO TABS: 600.0000 mg | ORAL_TABLET | Freq: Four times a day (QID) | ORAL | 0 refills | Status: DC | PRN

## 2020-11-08 MED ORDER — IBUPROFEN 400 MG PO TABS
600.0000 mg | ORAL_TABLET | Freq: Once | ORAL | Status: AC
  Administered 2020-11-08: 600 mg via ORAL
  Filled 2020-11-08: qty 1

## 2020-11-08 MED ORDER — LIDOCAINE-EPINEPHRINE (PF) 2 %-1:200000 IJ SOLN
10.0000 mL | Freq: Once | INTRAMUSCULAR | Status: AC
  Administered 2020-11-08: 10 mL
  Filled 2020-11-08: qty 20

## 2020-11-08 NOTE — ED Notes (Signed)
Provider at bedside at this time
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
## 2020-11-08 NOTE — ED Triage Notes (Signed)
Pt reports injury to right eye that is causing pain. Pt feels like something is in his eye. States that vision is blurred and he is unable to sleep at night.

## 2020-11-08 NOTE — ED Provider Notes (Signed)
Emergency Medicine Provider Triage Evaluation Note  Blake Chambers , a 31 y.o. male  was evaluated in triage.  Pt complains of pain and swelling to the right cheek ongoing for the last several days.  Review of Systems  Positive: Right cheek swelling Negative: fever  Physical Exam  BP 119/76   Pulse 94   Temp 99.4 F (37.4 C)   Resp 18   SpO2 100%  Gen:   Awake, no distress   Resp:  Normal effort  MSK:   Moves extremities without difficulty  Other:  Wound and induration noted to the right cheek  Medical Decision Making  Medically screening exam initiated at 6:51 PM.  Appropriate orders placed.  Blake Chambers was informed that the remainder of the evaluation will be completed by another provider, this initial triage assessment does not replace that evaluation, and the importance of remaining in the ED until their evaluation is complete.    Rayne Du 11/08/20 1853    Margarita Grizzle, MD 11/08/20 [phone]

## 2020-11-08 NOTE — ED Notes (Signed)
Provider at bedside

## 2020-11-09 ENCOUNTER — Emergency Department (HOSPITAL_COMMUNITY)
Admission: EM | Admit: 2020-11-09 | Discharge: 2020-11-09 | Disposition: A | Discharge status: Home / Self Care | Payer: Medicaid Other | Attending: Emergency Medicine | Admitting: Emergency Medicine

## 2020-11-09 ENCOUNTER — Encounter (HOSPITAL_COMMUNITY): Payer: Self-pay | Admitting: Emergency Medicine

## 2020-11-09 ENCOUNTER — Emergency Department (HOSPITAL_COMMUNITY): Payer: Medicaid Other

## 2020-11-09 DIAGNOSIS — F17210 Nicotine dependence, cigarettes, uncomplicated: Secondary | ICD-10-CM | POA: Diagnosis not present

## 2020-11-09 DIAGNOSIS — N189 Chronic kidney disease, unspecified: Secondary | ICD-10-CM | POA: Diagnosis not present

## 2020-11-09 DIAGNOSIS — W541XXA Struck by dog, initial encounter: Secondary | ICD-10-CM | POA: Insufficient documentation

## 2020-11-09 DIAGNOSIS — S8992XA Unspecified injury of left lower leg, initial encounter: Secondary | ICD-10-CM | POA: Diagnosis present

## 2020-11-09 DIAGNOSIS — B20 Human immunodeficiency virus [HIV] disease: Secondary | ICD-10-CM | POA: Insufficient documentation

## 2020-11-09 DIAGNOSIS — S86912A Strain of unspecified muscle(s) and tendon(s) at lower leg level, left leg, initial encounter: Secondary | ICD-10-CM | POA: Diagnosis not present

## 2020-11-09 DIAGNOSIS — S76912A Strain of unspecified muscles, fascia and tendons at thigh level, left thigh, initial encounter: Secondary | ICD-10-CM | POA: Diagnosis not present

## 2020-11-09 MED ORDER — IBUPROFEN 400 MG PO TABS
400.0000 mg | ORAL_TABLET | Freq: Once | ORAL | Status: AC
  Administered 2020-11-09: 400 mg via ORAL
  Filled 2020-11-09: qty 1

## 2020-11-09 NOTE — ED Triage Notes (Signed)
Per EMS, pt from laundry mat.  Reported to EMS that after he was released from ED a dog chased him and while it did not bite him it took everything out of his backpack.  Upon clarification pt stated that dog tackled him hurting his left leg.  Pt ambulatory in triage eating a candy bar.

## 2020-11-09 NOTE — ED Provider Notes (Signed)
[name] EMERGENCY DEPARTMENT Provider Note   CSN: [id number] Arrival date & time: 11/09/20  0046     History Chief Complaint  Patient presents with  . Leg Pain    Blake Chambers is a 30 y.o. male.  The history is provided by the patient.  Leg Pain He has history of HIV disease, chronic kidney disease and comes in having injured his left leg.  He states he was tackled by a dog.  He denies other injury.  He rates pain at 6/10.  He states he was not able to walk on his leg because it was too painful.   Past Medical History:  Diagnosis Date  . Anxiety   . Bipolar affective (HCC)   . HIV (human immunodeficiency virus infection) (HCC)   . Immune deficiency disorder (HCC)   . Mental health disorder   . 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 Syphilis     Patient Active Problem List   Diagnosis Date Noted  . 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 Pain of left forearm   . Finger osteomyelitis, left (HCC) 09/11/2020  . Family discord 08/13/2020  . Perirectal cellulitis 11/01/2017  . Abscess 08/20/2017  . Perirectal abscess 08/20/2017  . Encounter for long-term (current) use of high-risk medication 02/24/2017  . Screening examination for venereal disease 10/21/2016  . Cocaine use disorder, moderate, dependence (HCC) 10/23/2014  . Cannabis use disorder, severe, dependence (HCC) 10/23/2014  . Bipolar I disorder, severe, current or most recent episode depressed, with mixed features (HCC) 10/21/2014  . Frostbite of both hands 08/01/2014  . Tobacco use disorder 07/18/2013  . Chronic renal insufficiency, stage II (mild) 11/30/2012  . HIV disease (HCC) 02/13/2011  . Syphilis 02/13/2011  . Bipolar I disorder (HCC) 02/10/2011    Past Surgical History:  Procedure Laterality Date  . IRRIGATION AND DEBRIDEMENT ABSCESS N/A 08/20/2017   Procedure: IRRIGATION AND DEBRIDEMENT PERI RECTAL ABSCESS;  Surgeon: Andria Meuse, MD;  Location: [facility] OR;   Service: General;  Laterality: N/A;       Family History  Family history unknown: Yes    Social History   Tobacco Use  . Smoking status: Current Every Day Smoker    Packs/day: 0.30    Types: Cigarettes  . Smokeless tobacco: Never Used  . Tobacco comment: pt. trying to quit  Vaping Use  . Vaping Use: Never used  Substance Use Topics  . Alcohol use: No    Alcohol/week: 1.0 standard drink    Types: 1 Standard drinks or equivalent per week  . Drug use: Not Currently    Frequency: 1.0 times per week    Types: Marijuana, Cocaine    Home Medications Prior to Admission medications   Medication Sig Start Date End Date Taking? Authorizing Provider  ibuprofen (ADVIL) 600 MG tablet Take 1 tablet (600 mg total) by mouth every 6 (six) hours as needed. 11/08/20   Jacalyn Lefevre, MD  sulfamethoxazole-trimethoprim (BACTRIM DS) 800-160 MG tablet Take 1 tablet by mouth 2 (two) times daily for 7 days. 11/08/20 11/15/20  Jacalyn Lefevre, MD    Allergies    Tenofovir disoproxil  Review of Systems   Review of Systems  All other systems reviewed and are negative.   Physical Exam Updated Vital Signs BP 117/73 (BP Location: Left Arm)   Pulse 77   Temp 98.3 F (36.8 C) (Oral)   Resp 14   SpO2 99%   Physical Exam Vitals and nursing  note reviewed.   30 year old male, resting comfortably and in no acute distress. Vital signs are normal. Oxygen saturation is 99%, which is normal. Head is normocephalic and atraumatic. PERRLA, EOMI. Oropharynx is clear. Neck is nontender and supple without adenopathy or JVD. Back is nontender and there is no CVA tenderness. Lungs are clear without rales, wheezes, or rhonchi. Chest is nontender. Heart has regular rate and rhythm without murmur. Abdomen is soft, flat, nontender without masses or hepatosplenomegaly and peristalsis is normoactive. Extremities: No swelling or deformity noted of the left leg.  There is tenderness to palpation in the left calf  and left thigh rather diffusely.  Full passive range of motion is present in all joints, but he does complain of pain with passive range of motion in his left leg. Skin is warm and dry without rash. Neurologic: Mental status is normal, cranial nerves are intact, there are no motor or sensory deficits.  ED Results / Procedures / Treatments    Radiology DG Tibia/Fibula Left  Result Date: 11/09/2020 CLINICAL DATA:  Fall, left leg injury EXAM: LEFT TIBIA AND FIBULA - 2 VIEW COMPARISON:  None. FINDINGS: There is no evidence of fracture or other focal bone lesions. Soft tissues are unremarkable. IMPRESSION: Negative. Electronically Signed   By: Helyn Numbers MD   On: 11/09/2020 03:15   DG Femur Min 2 Views Left  Result Date: 11/09/2020 CLINICAL DATA:  Fall, left leg injury EXAM: LEFT FEMUR 2 VIEWS COMPARISON:  None. FINDINGS: There is no evidence of fracture or other focal bone lesions. Oblique lucency within the a loop distal left femur is artifactual, likely related to intramuscular fat planes superimposed upon osseous structures. Soft tissues are unremarkable. IMPRESSION: Negative. Electronically Signed   By: Helyn Numbers MD   On: 11/09/2020 03:15    Procedures Procedures   Medications Ordered in ED Medications - No data to display  ED Course  I have reviewed the triage vital signs and the nursing notes.  Pertinent imaging results that were available during my care of the patient were reviewed by me and considered in my medical decision making (see chart for details).    MDM Rules/Calculators/A&P                         Left leg injury which appears relatively minor.  Will send for x-rays.  Old records are reviewed, and it is noted that he actually was in the ED earlier this evening for I&D of a facial abscess.  X-rays are negative for acute injury.  He is given crutches and advised to use ice, take over-the-counter analgesics as needed for pain.  Final Clinical Impression(s) / ED  Diagnoses Final diagnoses:  Muscle strain of left lower leg, initial encounter  Muscle strain of left thigh, initial encounter    Rx / DC Orders ED Discharge Orders    None       Dione Booze, MD 11/09/20 [phone]

## 2020-11-09 NOTE — ED Notes (Signed)
Patient transported to X-ray 

## 2020-11-09 NOTE — Discharge Instructions (Addendum)
Apply ice for 30 minutes at a time, 4 times a day.  Use crutches as needed.  Take ibuprofen or naproxen as needed for pain.  If you need additional pain relief, add acetaminophen.

## 2020-11-14 ENCOUNTER — Other Ambulatory Visit: Payer: Self-pay

## 2020-11-14 ENCOUNTER — Emergency Department (HOSPITAL_COMMUNITY)
Admission: EM | Admit: 2020-11-14 | Discharge: 2020-11-15 | Disposition: A | Discharge status: Home / Self Care | Payer: Medicaid Other | Attending: Emergency Medicine | Admitting: Emergency Medicine

## 2020-11-14 DIAGNOSIS — G8929 Other chronic pain: Secondary | ICD-10-CM | POA: Insufficient documentation

## 2020-11-14 DIAGNOSIS — R059 Cough, unspecified: Secondary | ICD-10-CM | POA: Insufficient documentation

## 2020-11-14 DIAGNOSIS — F17210 Nicotine dependence, cigarettes, uncomplicated: Secondary | ICD-10-CM | POA: Insufficient documentation

## 2020-11-14 DIAGNOSIS — R509 Fever, unspecified: Secondary | ICD-10-CM | POA: Insufficient documentation

## 2020-11-14 DIAGNOSIS — M79605 Pain in left leg: Secondary | ICD-10-CM | POA: Diagnosis not present

## 2020-11-14 DIAGNOSIS — Z21 Asymptomatic human immunodeficiency virus [HIV] infection status: Secondary | ICD-10-CM | POA: Diagnosis not present

## 2020-11-14 DIAGNOSIS — R0981 Nasal congestion: Secondary | ICD-10-CM | POA: Insufficient documentation

## 2020-11-14 NOTE — ED Triage Notes (Signed)
Pt came from street via EMS. C/C: nasal congestion that feels better today, left lower leg pain, bilateral feet pain.

## 2020-11-15 MED ORDER — FLUTICASONE PROPIONATE 50 MCG/ACT NA SUSP
1.0000 | Freq: Every day | NASAL | Status: DC
  Filled 2020-11-15: qty 16

## 2020-11-15 NOTE — Discharge Instructions (Addendum)
Use the nasal spray to help with your congestion. Continue your regular medications. Follow up with your doctor for routine health concerns.

## 2020-11-15 NOTE — ED Provider Notes (Signed)
[facility]-EMERGENCY DEPT Provider Note   CSN: [id number] Arrival date & time: 11/14/20  2330     History Chief Complaint  Patient presents with  . Leg Pain    Blake Chambers is a 30 y.o. male.  Patient with history of HIV, bipolar, anxiety presents for evaluation of nasal congestion. He reports "walking about 9 miles today" that he feels contributed to his symptoms. He reports persistent/chronic left leg pain after walking. He has a mild cough. He reports fever but has not taken his temperature. No sore throat, nausea, vomiting. He has not taken any medications to alleviate symptoms.   The history is provided by the patient. No language interpreter was used.       Past Medical History:  Diagnosis Date  . Anxiety   . Bipolar affective (HCC)   . HIV (human immunodeficiency virus infection) (HCC)   . Immune deficiency disorder (HCC)   . Mental health disorder   . 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 Syphilis     Patient Active Problem List   Diagnosis Date Noted  . 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 Pain of left forearm   . Finger osteomyelitis, left (HCC) 09/11/2020  . Family discord 08/13/2020  . Perirectal cellulitis 11/01/2017  . Abscess 08/20/2017  . Perirectal abscess 08/20/2017  . Encounter for long-term (current) use of high-risk medication 02/24/2017  . Screening examination for venereal disease 10/21/2016  . Cocaine use disorder, moderate, dependence (HCC) 10/23/2014  . Cannabis use disorder, severe, dependence (HCC) 10/23/2014  . Bipolar I disorder, severe, current or most recent episode depressed, with mixed features (HCC) 10/21/2014  . Frostbite of both hands 08/01/2014  . Tobacco use disorder 07/18/2013  . Chronic renal insufficiency, stage II (mild) 11/30/2012  . HIV disease (HCC) 02/13/2011  . Syphilis 02/13/2011  . Bipolar I disorder (HCC) 02/10/2011    Past Surgical History:  Procedure Laterality  Date  . IRRIGATION AND DEBRIDEMENT ABSCESS N/A 08/20/2017   Procedure: IRRIGATION AND DEBRIDEMENT PERI RECTAL ABSCESS;  Surgeon: Andria Meuse, MD;  Location: [facility] OR;  Service: General;  Laterality: N/A;       Family History  Family history unknown: Yes    Social History   Tobacco Use  . Smoking status: Current Every Day Smoker    Packs/day: 0.30    Types: Cigarettes  . Smokeless tobacco: Never Used  . Tobacco comment: pt. trying to quit  Vaping Use  . Vaping Use: Never used  Substance Use Topics  . Alcohol use: No    Alcohol/week: 1.0 standard drink    Types: 1 Standard drinks or equivalent per week  . Drug use: Not Currently    Frequency: 1.0 times per week    Types: Marijuana, Cocaine    Home Medications Prior to Admission medications   Medication Sig Start Date End Date Taking? Authorizing Provider  ibuprofen (ADVIL) 600 MG tablet Take 1 tablet (600 mg total) by mouth every 6 (six) hours as needed. 11/08/20   Jacalyn Lefevre, MD  sulfamethoxazole-trimethoprim (BACTRIM DS) 800-160 MG tablet Take 1 tablet by mouth 2 (two) times daily for 7 days. 11/08/20 11/15/20  Jacalyn Lefevre, MD    Allergies    Tenofovir disoproxil  Review of Systems   Review of Systems  Constitutional: Positive for fever (Subjective). Negative for activity change.  HENT: Positive for congestion. Negative for sore throat.   Respiratory: Positive for cough.   Cardiovascular: Negative for chest pain.  Gastrointestinal: Negative for abdominal pain and nausea.  Musculoskeletal: Negative for myalgias.  Neurological: Negative for weakness.    Physical Exam Updated Vital Signs BP 106/71   Pulse 96   Temp 98.7 F (37.1 C) (Oral)   Resp 20   Ht 5\' 9"  (1.753 m)   Wt 63.5 kg   SpO2 97%   BMI 20.67 kg/m   Physical Exam Vitals and nursing note reviewed.  Constitutional:      General: He is not in acute distress.    Appearance: He is well-developed. He is not ill-appearing.  HENT:      Head: Normocephalic.     Comments: Facial swelling over right zygomatic arch without redness or induration c/w recent history of facial abscess.     Nose: Nose normal. No congestion.     Comments: No mucosal swelling    Mouth/Throat:     Mouth: Mucous membranes are moist.  Cardiovascular:     Rate and Rhythm: Normal rate and regular rhythm.     Heart sounds: No murmur heard.   Pulmonary:     Effort: Pulmonary effort is normal.     Breath sounds: Normal breath sounds. No wheezing, rhonchi or rales.  Abdominal:     General: Bowel sounds are normal.     Palpations: Abdomen is soft.     Tenderness: There is no abdominal tenderness. There is no guarding or rebound.  Musculoskeletal:        General: Normal range of motion.     Cervical back: Normal range of motion and neck supple.     Comments: Ambulatory and fully weight bearing.   Skin:    General: Skin is warm and dry.     Findings: No rash.  Neurological:     Mental Status: He is alert and oriented to person, place, and time.     ED Results / Procedures / Treatments   Labs (all labs ordered are listed, but only abnormal results are displayed) Labs Reviewed - No data to display  EKG None  Radiology No results found.  Procedures Procedures   Medications Ordered in ED Medications - No data to display  ED Course  I have reviewed the triage vital signs and the nursing notes.  Pertinent labs & imaging results that were available during my care of the patient were reviewed by me and considered in my medical decision making (see chart for details).    MDM Rules/Calculators/A&P                          Patient to ED for evaluation of congestion. He is afebrile, VSS. Exam is unremarkable. Doubt PNA, acute bacterial infection or sinusitis.   He has left leg pain c/w chronic pain. Is weight bearing at this time. No further evaluation indicated.   Will provide flonase nasal inhaler. Encouraged f/u with primary care.    Final Clinical Impression(s) / ED Diagnoses Final diagnoses:  None   1. Nasal congestion 2. Chronic leg pain  Rx / DC Orders ED Discharge Orders    None       , PA-C 11/15/20 0008    01/15/21, MD 11/15/20 [phone]

## 2020-11-20 DIAGNOSIS — Z5900 Homelessness unspecified: Secondary | ICD-10-CM | POA: Insufficient documentation

## 2020-11-20 DIAGNOSIS — Z5321 Procedure and treatment not carried out due to patient leaving prior to being seen by health care provider: Secondary | ICD-10-CM | POA: Diagnosis not present

## 2020-11-20 DIAGNOSIS — L0201 Cutaneous abscess of face: Secondary | ICD-10-CM | POA: Insufficient documentation

## 2020-11-20 NOTE — ED Triage Notes (Signed)
Patient arrives with [facility], homeless, facial abscess on R face.  EMS  122/86 100 HR 16 RR 100% RA 110 CBG 97.7 temp

## 2020-11-21 ENCOUNTER — Emergency Department (HOSPITAL_COMMUNITY)
Admission: EM | Admit: 2020-11-21 | Discharge: 2020-11-21 | Disposition: A | Discharge status: Home / Self Care | Payer: Medicaid Other | Attending: Emergency Medicine | Admitting: Emergency Medicine

## 2020-11-21 NOTE — ED Notes (Signed)
Pt called to be triaged, no response x1 

## 2020-11-28 ENCOUNTER — Encounter (HOSPITAL_COMMUNITY): Payer: Self-pay

## 2020-11-28 ENCOUNTER — Emergency Department (HOSPITAL_COMMUNITY)
Admission: EM | Admit: 2020-11-28 | Discharge: 2020-11-29 | Disposition: A | Discharge status: Home / Self Care | Payer: Medicaid Other | Attending: Emergency Medicine | Admitting: Emergency Medicine

## 2020-11-28 ENCOUNTER — Other Ambulatory Visit: Payer: Self-pay

## 2020-11-28 ENCOUNTER — Ambulatory Visit: Payer: Medicaid Other | Admitting: Pharmacist

## 2020-11-28 DIAGNOSIS — Z21 Asymptomatic human immunodeficiency virus [HIV] infection status: Secondary | ICD-10-CM | POA: Diagnosis not present

## 2020-11-28 DIAGNOSIS — R1084 Generalized abdominal pain: Secondary | ICD-10-CM

## 2020-11-28 DIAGNOSIS — F17210 Nicotine dependence, cigarettes, uncomplicated: Secondary | ICD-10-CM | POA: Insufficient documentation

## 2020-11-28 DIAGNOSIS — R109 Unspecified abdominal pain: Secondary | ICD-10-CM | POA: Insufficient documentation

## 2020-11-28 DIAGNOSIS — R252 Cramp and spasm: Secondary | ICD-10-CM | POA: Diagnosis not present

## 2020-11-28 LAB — CBC WITH DIFFERENTIAL/PLATELET
Abs Immature Granulocytes: 0.02 10*3/uL (ref 0.00–0.07)
Basophils Absolute: 0 10*3/uL (ref 0.0–0.1)
Basophils Relative: 1 %
Eosinophils Absolute: 0.1 10*3/uL (ref 0.0–0.5)
Eosinophils Relative: 2 %
HCT: 39.9 % (ref 39.0–52.0)
Hemoglobin: 12.6 g/dL — ABNORMAL LOW (ref 13.0–17.0)
Immature Granulocytes: 0 %
Lymphocytes Relative: 36 %
Lymphs Abs: 2.4 10*3/uL (ref 0.7–4.0)
MCH: 26.5 pg (ref 26.0–34.0)
MCHC: 31.6 g/dL (ref 30.0–36.0)
MCV: 83.8 fL (ref 80.0–100.0)
Monocytes Absolute: 0.6 10*3/uL (ref 0.1–1.0)
Monocytes Relative: 8 %
Neutro Abs: 3.6 10*3/uL (ref 1.7–7.7)
Neutrophils Relative %: 53 %
Platelets: 455 10*3/uL — ABNORMAL HIGH (ref 150–400)
RBC: 4.76 MIL/uL (ref 4.22–5.81)
RDW: 15.3 % (ref 11.5–15.5)
WBC: 6.8 10*3/uL (ref 4.0–10.5)
nRBC: 0 % (ref 0.0–0.2)

## 2020-11-28 LAB — LIPASE, BLOOD: Lipase: 57 U/L — ABNORMAL HIGH (ref 11–51)

## 2020-11-28 LAB — COMPREHENSIVE METABOLIC PANEL
ALT: 29 U/L (ref 0–44)
AST: 34 U/L (ref 15–41)
Albumin: 3.5 g/dL (ref 3.5–5.0)
Alkaline Phosphatase: 88 U/L (ref 38–126)
Anion gap: 5 (ref 5–15)
BUN: 8 mg/dL (ref 6–20)
CO2: 31 mmol/L (ref 22–32)
Calcium: 8.7 mg/dL — ABNORMAL LOW (ref 8.9–10.3)
Chloride: 103 mmol/L (ref 98–111)
Creatinine, Ser: 1.32 mg/dL — ABNORMAL HIGH (ref 0.61–1.24)
GFR, Estimated: >60 mL/min (ref >60)
Glucose, Bld: 117 mg/dL — ABNORMAL HIGH (ref 70–99)
Potassium: 4.1 mmol/L (ref 3.5–5.1)
Sodium: 139 mmol/L (ref 135–145)
Total Bilirubin: 0.3 mg/dL (ref 0.3–1.2)
Total Protein: 8.2 g/dL — ABNORMAL HIGH (ref 6.5–8.1)

## 2020-11-28 LAB — CK: Total CK: 278 U/L (ref 49–397)

## 2020-11-28 LAB — MAGNESIUM: Magnesium: 2 mg/dL (ref 1.7–2.4)

## 2020-11-28 LAB — ETHANOL: Alcohol, Ethyl (B): <10 mg/dL (ref <10)

## 2020-11-28 NOTE — ED Triage Notes (Signed)
[place] [facility] [place], states he is homeless and c/o generalized pain but specifically leg and abd pain

## 2020-11-28 NOTE — ED Provider Notes (Signed)
Emergency Medicine Provider Triage Evaluation Note  Brockton Mckesson , a 30 y.o. male  was evaluated in triage.  Pt complains of "pain all over."  States he just feels achy.  States he has been outside walking for a long time.  He will not give further details on any of this.  Review of Systems  Positive: Body aches, fatigue Negative: Fever, specific chest pain, specific abdominal pain, N/V/D, confusion, dizziness  Physical Exam  BP 115/81   Pulse 80   Temp (!) 97.3 F (36.3 C) (Oral)   Resp 16   SpO2 100%  Gen:   Awake, no distress   Resp:  Normal effort  MSK:   Moves extremities without difficulty  Other:  Follows commands.  Oriented x4.  Medical Decision Making  Medically screening exam initiated at 10:15 PM.  Appropriate orders placed.  Tyce Delcid was informed that the remainder of the evaluation will be completed by another provider, this initial triage assessment does not replace that evaluation, and the importance of remaining in the ED until their evaluation is complete.     Concepcion Living 11/28/20 2218    Cathren Laine, MD 11/29/20 1650

## 2020-11-29 NOTE — Discharge Instructions (Addendum)
Drink plenty of fluids.  Follow-up with your primary doctor if symptoms are not improving in the next few days.

## 2020-11-29 NOTE — ED Notes (Signed)
Security assisted with patient discharge

## 2020-11-29 NOTE — ED Provider Notes (Signed)
[facility]-EMERGENCY DEPT Provider Note   CSN: [id number] Arrival date & time: 11/28/20  2151     History Chief Complaint  Patient presents with   Abdominal Pain    Blake Chambers is a 30 y.o. male.  Patient is a 30 year old male with past medical history of HIV disease, bipolar, anxiety, and homelessness.  Patient presenting today for evaluation of "cramping all over".  He tells me he has been walking all day, but will not wake up to offer much more additional history.  Patient with history of frequent ED visits.  I have seen him on 3 occasions in the past few months, each time he comes here and is soundly asleep.  The history is provided by the patient.      Past Medical History:  Diagnosis Date   Anxiety    Bipolar affective (HCC)    HIV (human immunodeficiency virus infection) (HCC)    Immune deficiency disorder (HCC)    Mental health disorder    Osteomyelitis of finger of left hand (HCC)    Syphilis     Patient Active Problem List   Diagnosis Date Noted   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   Pain of left forearm    Finger osteomyelitis, left (HCC) 09/11/2020   Family discord 08/13/2020   Perirectal cellulitis 11/01/2017   Abscess 08/20/2017   Perirectal abscess 08/20/2017   Encounter for long-term (current) use of high-risk medication 02/24/2017   Screening examination for venereal disease 10/21/2016   Cocaine use disorder, moderate, dependence (HCC) 10/23/2014   Cannabis use disorder, severe, dependence (HCC) 10/23/2014   Bipolar I disorder, severe, current or most recent episode depressed, with mixed features (HCC) 10/21/2014   Frostbite of both hands 08/01/2014   Tobacco use disorder 07/18/2013   Chronic renal insufficiency, stage II (mild) 11/30/2012   HIV disease (HCC) 02/13/2011   Syphilis 02/13/2011   Bipolar I disorder (HCC) 02/10/2011    Past Surgical History:  Procedure Laterality Date   IRRIGATION AND  DEBRIDEMENT ABSCESS N/A 08/20/2017   Procedure: IRRIGATION AND DEBRIDEMENT PERI RECTAL ABSCESS;  Surgeon: Andria Meuse, MD;  Location: [facility] OR;  Service: General;  Laterality: N/A;       Family History  Family history unknown: Yes    Social History   Tobacco Use   Smoking status: Every Day    Packs/day: 0.30    Pack years: 0.00    Types: Cigarettes   Smokeless tobacco: Never   Tobacco comments:    pt. trying to quit  Vaping Use   Vaping Use: Never used  Substance Use Topics   Alcohol use: No    Alcohol/week: 1.0 standard drink    Types: 1 Standard drinks or equivalent per week   Drug use: Not Currently    Frequency: 1.0 times per week    Types: Marijuana, Cocaine    Home Medications Prior to Admission medications   Medication Sig Start Date End Date Taking? Authorizing Provider  ibuprofen (ADVIL) 600 MG tablet Take 1 tablet (600 mg total) by mouth every 6 (six) hours as needed. 11/08/20   Jacalyn Lefevre, MD    Allergies    Tenofovir disoproxil  Review of Systems   Review of Systems  All other systems reviewed and are negative.  Physical Exam Updated Vital Signs BP 109/67   Pulse 81   Temp (!) 97.3 F (36.3 C) (Oral)   Resp 17   SpO2 99%   Physical Exam Vitals and nursing  note reviewed.  Constitutional:      General: He is not in acute distress.    Appearance: He is well-developed. He is not diaphoretic.  HENT:     Head: Normocephalic and atraumatic.  Cardiovascular:     Rate and Rhythm: Normal rate and regular rhythm.     Heart sounds: No murmur heard.   No friction rub.  Pulmonary:     Effort: Pulmonary effort is normal. No respiratory distress.     Breath sounds: Normal breath sounds. No wheezing or rales.  Abdominal:     General: Bowel sounds are normal. There is no distension.     Palpations: Abdomen is soft.     Tenderness: There is no abdominal tenderness. There is no right CVA tenderness, left CVA tenderness, guarding or rebound.   Musculoskeletal:        General: Normal range of motion.     Cervical back: Normal range of motion and neck supple.  Skin:    General: Skin is warm and dry.  Neurological:     Mental Status: He is alert and oriented to person, place, and time.     Coordination: Coordination normal.    ED Results / Procedures / Treatments   Labs (all labs ordered are listed, but only abnormal results are displayed) Labs Reviewed  COMPREHENSIVE METABOLIC PANEL - Abnormal; Notable for the following components:      Result Value   Glucose, Bld 117 (*)    Creatinine, Ser 1.32 (*)    Calcium 8.7 (*)    Total Protein 8.2 (*)    All other components within normal limits  CBC WITH DIFFERENTIAL/PLATELET - Abnormal; Notable for the following components:   Hemoglobin 12.6 (*)    Platelets 455 (*)    All other components within normal limits  LIPASE, BLOOD - Abnormal; Notable for the following components:   Lipase 57 (*)    All other components within normal limits  ETHANOL  CK  MAGNESIUM  URINALYSIS, ROUTINE W REFLEX MICROSCOPIC  RAPID URINE DRUG SCREEN, HOSP PERFORMED    EKG None  Radiology No results found.  Procedures Procedures   Medications Ordered in ED Medications - No data to display  ED Course  I have reviewed the triage vital signs and the nursing notes.  Pertinent labs & imaging results that were available during my care of the patient were reviewed by me and considered in my medical decision making (see chart for details).    MDM Rules/Calculators/A&P  Patient is a 30 year old male with past medical history as per HPI.  He presents here complaining of aching all over.  He states he has been walking all day and came here from a train station.  Patient's laboratory studies are all unremarkable.  His vitals are stable and he appears well-hydrated.  He has been very somnolent again this evening just as he was the last 2 times I have seen him.  I suspect some degree of  malingering as patient is apparently homeless.  Nothing today appears emergent.  His abdomen is benign and laboratory studies are reassuring.  Final Clinical Impression(s) / ED Diagnoses Final diagnoses:  None    Rx / DC Orders ED Discharge Orders     None        Geoffery Leblond, MD 11/29/20 [phone]

## 2021-01-10 ENCOUNTER — Other Ambulatory Visit: Payer: Medicaid Other

## 2021-01-10 ENCOUNTER — Encounter (HOSPITAL_COMMUNITY): Payer: Self-pay | Admitting: Emergency Medicine

## 2021-01-10 ENCOUNTER — Other Ambulatory Visit: Payer: Self-pay

## 2021-01-10 ENCOUNTER — Emergency Department (HOSPITAL_COMMUNITY)
Admission: EM | Admit: 2021-01-10 | Discharge: 2021-01-10 | Disposition: A | Discharge status: Home / Self Care | Payer: Medicaid Other | Attending: Emergency Medicine | Admitting: Emergency Medicine

## 2021-01-10 ENCOUNTER — Other Ambulatory Visit (HOSPITAL_COMMUNITY)
Admission: RE | Admit: 2021-01-10 | Discharge: 2021-01-10 | Disposition: A | Discharge status: Home / Self Care | Payer: Medicaid Other | Source: Ambulatory Visit | Attending: Internal Medicine | Admitting: Internal Medicine

## 2021-01-10 DIAGNOSIS — F17210 Nicotine dependence, cigarettes, uncomplicated: Secondary | ICD-10-CM | POA: Insufficient documentation

## 2021-01-10 DIAGNOSIS — Z79899 Other long term (current) drug therapy: Secondary | ICD-10-CM

## 2021-01-10 DIAGNOSIS — Z113 Encounter for screening for infections with a predominantly sexual mode of transmission: Secondary | ICD-10-CM

## 2021-01-10 DIAGNOSIS — B20 Human immunodeficiency virus [HIV] disease: Secondary | ICD-10-CM

## 2021-01-10 DIAGNOSIS — Z21 Asymptomatic human immunodeficiency virus [HIV] infection status: Secondary | ICD-10-CM | POA: Insufficient documentation

## 2021-01-10 DIAGNOSIS — M79671 Pain in right foot: Secondary | ICD-10-CM | POA: Diagnosis not present

## 2021-01-10 DIAGNOSIS — N182 Chronic kidney disease, stage 2 (mild): Secondary | ICD-10-CM | POA: Diagnosis not present

## 2021-01-10 DIAGNOSIS — M79672 Pain in left foot: Secondary | ICD-10-CM | POA: Diagnosis not present

## 2021-01-10 NOTE — ED Triage Notes (Signed)
Patient states his feet hurt do to him walking. Patient is sleeping while trying to do triage.

## 2021-01-10 NOTE — ED Provider Notes (Signed)
[facility]-EMERGENCY DEPT Provider Note   CSN: [id number] Arrival date & time: 01/10/21  0048     History Chief Complaint  Patient presents with   Foot Pain    Blake Chambers is a 30 y.o. male.  Patient presents to the emergency department for evaluation of bilateral foot pain.  Patient reports that he has been walking a lot and this has caused him to have foot pain.  This is a common complaint for the patient.      Past Medical History:  Diagnosis Date   Anxiety    Bipolar affective (HCC)    HIV (human immunodeficiency virus infection) (HCC)    Immune deficiency disorder (HCC)    Mental health disorder    Osteomyelitis of finger of left hand (HCC)    Syphilis     Patient Active Problem List   Diagnosis Date Noted   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   Pain of left forearm    Finger osteomyelitis, left (HCC) 09/11/2020   Family discord 08/13/2020   Perirectal cellulitis 11/01/2017   Abscess 08/20/2017   Perirectal abscess 08/20/2017   Encounter for long-term (current) use of high-risk medication 02/24/2017   Screening examination for venereal disease 10/21/2016   Cocaine use disorder, moderate, dependence (HCC) 10/23/2014   Cannabis use disorder, severe, dependence (HCC) 10/23/2014   Bipolar I disorder, severe, current or most recent episode depressed, with mixed features (HCC) 10/21/2014   Frostbite of both hands 08/01/2014   Tobacco use disorder 07/18/2013   Chronic renal insufficiency, stage II (mild) 11/30/2012   HIV disease (HCC) 02/13/2011   Syphilis 02/13/2011   Bipolar I disorder (HCC) 02/10/2011    Past Surgical History:  Procedure Laterality Date   IRRIGATION AND DEBRIDEMENT ABSCESS N/A 08/20/2017   Procedure: IRRIGATION AND DEBRIDEMENT PERI RECTAL ABSCESS;  Surgeon: Andria Meuse, MD;  Location: [facility] OR;  Service: General;  Laterality: N/A;       Family History  Family history unknown: Yes    Social  History   Tobacco Use   Smoking status: Every Day    Packs/day: 0.30    Types: Cigarettes   Smokeless tobacco: Never   Tobacco comments:    pt. trying to quit  Vaping Use   Vaping Use: Never used  Substance Use Topics   Alcohol use: No    Alcohol/week: 1.0 standard drink    Types: 1 Standard drinks or equivalent per week   Drug use: Not Currently    Frequency: 1.0 times per week    Types: Marijuana, Cocaine    Home Medications Prior to Admission medications   Medication Sig Start Date End Date Taking? Authorizing Provider  ibuprofen (ADVIL) 600 MG tablet Take 1 tablet (600 mg total) by mouth every 6 (six) hours as needed. 11/08/20   Jacalyn Lefevre, MD    Allergies    Tenofovir disoproxil  Review of Systems   Review of Systems  Musculoskeletal:  Positive for arthralgias.  Skin: Negative.    Physical Exam Updated Vital Signs BP 121/80 (BP Location: Left Arm)   Pulse 79   Temp (!) 97.5 F (36.4 C) (Oral)   Resp 16   Ht 5\' 9"  (1.753 m)   Wt 64.4 kg   SpO2 97%   BMI 20.97 kg/m   Physical Exam Vitals and nursing note reviewed.  Constitutional:      General: He is not in acute distress.    Appearance: Normal appearance. He is well-developed.  HENT:  Head: Normocephalic and atraumatic.     Right Ear: Hearing normal.     Left Ear: Hearing normal.     Nose: Nose normal.  Eyes:     Conjunctiva/sclera: Conjunctivae normal.     Pupils: Pupils are equal, round, and reactive to light.  Cardiovascular:     Rate and Rhythm: Regular rhythm.     Heart sounds: S1 normal and S2 normal. No murmur heard.   No friction rub. No gallop.  Pulmonary:     Effort: Pulmonary effort is normal. No respiratory distress.     Breath sounds: Normal breath sounds.  Chest:     Chest wall: No tenderness.  Abdominal:     General: Bowel sounds are normal.     Palpations: Abdomen is soft.     Tenderness: There is no abdominal tenderness. There is no guarding or rebound. Negative signs  include Murphy's sign and McBurney's sign.     Hernia: No hernia is present.  Musculoskeletal:        General: Normal range of motion.     Cervical back: Normal range of motion and neck supple.  Skin:    General: Skin is warm and dry.     Findings: No rash.  Neurological:     Mental Status: He is alert and oriented to person, place, and time.     GCS: GCS eye subscore is 4. GCS verbal subscore is 5. GCS motor subscore is 6.     Cranial Nerves: No cranial nerve deficit.     Sensory: No sensory deficit.     Coordination: Coordination normal.  Psychiatric:        Speech: Speech normal.        Behavior: Behavior normal.        Thought Content: Thought content normal.    ED Results / Procedures / Treatments   Labs (all labs ordered are listed, but only abnormal results are displayed) Labs Reviewed - No data to display  EKG None  Radiology No results found.  Procedures Procedures   Medications Ordered in ED Medications - No data to display  ED Course  I have reviewed the triage vital signs and the nursing notes.  Pertinent labs & imaging results that were available during my care of the patient were reviewed by me and considered in my medical decision making (see chart for details).    MDM Rules/Calculators/A&P                           Patient presents to the emergency department with complaints of bilateral foot pain.  He has been seen previous with similar.  Examination does not show any concern for infection.  Pulses palpable, no concern for vascular compromise.  Patient somnolent, likely is looking for a place to sleep tonight.  Allowed to sleep, will discharge in morning.  Final Clinical Impression(s) / ED Diagnoses Final diagnoses:  Pain in both feet    Rx / DC Orders ED Discharge Orders     None        Chessica Audia, Canary Brim, MD 01/10/21 [id number]

## 2021-01-11 LAB — URINE CYTOLOGY ANCILLARY ONLY
Chlamydia: NEGATIVE
Comment: NEGATIVE
Comment: NORMAL
Neisseria Gonorrhea: NEGATIVE

## 2021-01-13 LAB — CBC WITH DIFFERENTIAL/PLATELET
Absolute Monocytes: 583 cells/uL (ref 200–950)
Basophils Absolute: 22 cells/uL (ref 0–200)
Basophils Relative: 0.4 %
Eosinophils Absolute: 200 cells/uL (ref 15–500)
Eosinophils Relative: 3.7 %
HCT: 39.7 % (ref 38.5–50.0)
Hemoglobin: 12.5 g/dL — ABNORMAL LOW (ref 13.2–17.1)
Lymphs Abs: 1895 cells/uL (ref 850–3900)
MCH: 25.8 pg — ABNORMAL LOW (ref 27.0–33.0)
MCHC: 31.5 g/dL — ABNORMAL LOW (ref 32.0–36.0)
MCV: 82 fL (ref 80.0–100.0)
MPV: 10.6 fL (ref 7.5–12.5)
Monocytes Relative: 10.8 %
Neutro Abs: 2700 cells/uL (ref 1500–7800)
Neutrophils Relative %: 50 %
Platelets: 331 10*3/uL (ref 140–400)
RBC: 4.84 10*6/uL (ref 4.20–5.80)
RDW: 14.7 % (ref 11.0–15.0)
Total Lymphocyte: 35.1 %
WBC: 5.4 10*3/uL (ref 3.8–10.8)

## 2021-01-13 LAB — COMPLETE METABOLIC PANEL WITH GFR
AG Ratio: 0.9 (calc) — ABNORMAL LOW (ref 1.0–2.5)
ALT: 18 U/L (ref 9–46)
AST: 21 U/L (ref 10–40)
Albumin: 3.6 g/dL (ref 3.6–5.1)
Alkaline phosphatase (APISO): 83 U/L (ref 36–130)
BUN: 20 mg/dL (ref 7–25)
CO2: 27 mmol/L (ref 20–32)
Calcium: 8.7 mg/dL (ref 8.6–10.3)
Chloride: 103 mmol/L (ref 98–110)
Creat: 1.13 mg/dL (ref 0.60–1.26)
Globulin: 3.9 g/dL (calc) — ABNORMAL HIGH (ref 1.9–3.7)
Glucose, Bld: 95 mg/dL (ref 65–99)
Potassium: 4.2 mmol/L (ref 3.5–5.3)
Sodium: 137 mmol/L (ref 135–146)
Total Bilirubin: 0.3 mg/dL (ref 0.2–1.2)
Total Protein: 7.5 g/dL (ref 6.1–8.1)
eGFR: 89 mL/min/{1.73_m2} (ref >=60)

## 2021-01-13 LAB — LIPID PANEL
Cholesterol: 152 mg/dL (ref <200)
HDL: 70 mg/dL (ref >=40)
LDL Cholesterol (Calc): 60 mg/dL (calc)
Non-HDL Cholesterol (Calc): 82 mg/dL (calc) (ref <130)
Total CHOL/HDL Ratio: 2.2 (calc) (ref <5.0)
Triglycerides: 138 mg/dL (ref <150)

## 2021-01-13 LAB — HIV-1 RNA QUANT-NO REFLEX-BLD
HIV 1 RNA Quant: 9590 Copies/mL — ABNORMAL HIGH
HIV-1 RNA Quant, Log: 3.98 Log cps/mL — ABNORMAL HIGH

## 2021-01-15 LAB — T-HELPER CELL (CD4) - (RCID CLINIC ONLY)
CD4 % Helper T Cell: 33 % (ref 33–65)
CD4 T Cell Abs: 620 /uL (ref 400–1790)

## 2021-01-18 ENCOUNTER — Emergency Department (HOSPITAL_COMMUNITY): Payer: Medicaid Other

## 2021-01-18 ENCOUNTER — Encounter (HOSPITAL_COMMUNITY): Payer: Self-pay

## 2021-01-18 ENCOUNTER — Other Ambulatory Visit: Payer: Self-pay

## 2021-01-18 ENCOUNTER — Emergency Department (HOSPITAL_COMMUNITY)
Admission: EM | Admit: 2021-01-18 | Discharge: 2021-01-19 | Disposition: A | Discharge status: Home / Self Care | Payer: Medicaid Other | Attending: Emergency Medicine | Admitting: Emergency Medicine

## 2021-01-18 DIAGNOSIS — Z5321 Procedure and treatment not carried out due to patient leaving prior to being seen by health care provider: Secondary | ICD-10-CM | POA: Insufficient documentation

## 2021-01-18 DIAGNOSIS — Z182 Retained plastic fragments: Secondary | ICD-10-CM | POA: Diagnosis not present

## 2021-01-18 DIAGNOSIS — Z21 Asymptomatic human immunodeficiency virus [HIV] infection status: Secondary | ICD-10-CM | POA: Diagnosis not present

## 2021-01-18 DIAGNOSIS — F17210 Nicotine dependence, cigarettes, uncomplicated: Secondary | ICD-10-CM | POA: Insufficient documentation

## 2021-01-18 DIAGNOSIS — R079 Chest pain, unspecified: Secondary | ICD-10-CM | POA: Diagnosis present

## 2021-01-18 DIAGNOSIS — U071 COVID-19: Secondary | ICD-10-CM | POA: Insufficient documentation

## 2021-01-18 DIAGNOSIS — Z79899 Other long term (current) drug therapy: Secondary | ICD-10-CM | POA: Diagnosis not present

## 2021-01-18 LAB — CBC
HCT: 43.6 % (ref 39.0–52.0)
Hemoglobin: 13.9 g/dL (ref 13.0–17.0)
MCH: 26.5 pg (ref 26.0–34.0)
MCHC: 31.9 g/dL (ref 30.0–36.0)
MCV: 83 fL (ref 80.0–100.0)
Platelets: 290 10*3/uL (ref 150–400)
RBC: 5.25 MIL/uL (ref 4.22–5.81)
RDW: 16.2 % — ABNORMAL HIGH (ref 11.5–15.5)
WBC: 7.5 10*3/uL (ref 4.0–10.5)
nRBC: 0 % (ref 0.0–0.2)

## 2021-01-18 LAB — TROPONIN I (HIGH SENSITIVITY): Troponin I (High Sensitivity): 3 ng/L (ref <18)

## 2021-01-18 LAB — BASIC METABOLIC PANEL
Anion gap: 7 (ref 5–15)
BUN: 19 mg/dL (ref 6–20)
CO2: 28 mmol/L (ref 22–32)
Calcium: 8.9 mg/dL (ref 8.9–10.3)
Chloride: 103 mmol/L (ref 98–111)
Creatinine, Ser: 1.06 mg/dL (ref 0.61–1.24)
GFR, Estimated: >60 mL/min (ref >60)
Glucose, Bld: 84 mg/dL (ref 70–99)
Potassium: 3.7 mmol/L (ref 3.5–5.1)
Sodium: 138 mmol/L (ref 135–145)

## 2021-01-18 NOTE — ED Triage Notes (Signed)
Patient states he has been walking for 8 hours today and has been having cp all day. Denies shob and radiation. Pt reports poor intake x2 days

## 2021-01-18 NOTE — ED Provider Notes (Signed)
Emergency Medicine Provider Triage Evaluation Note  Amr Sturtevant , a 30 y.o. male  was evaluated in triage.  Pt complains of CP that woke him up this morning. No radiation. Not worse with exertion. NO cardiac history. No SOB. No fevers. NO URI symptoms. Feels as if heaviness in chest, constant. No drug use.   Review of Systems  Positive: CP Negative: SOB  Physical Exam  BP 113/80 (BP Location: Left Arm)   Pulse (!) 101   Temp 98.6 F (37 C) (Oral)   Resp 16   Ht 5\' 9"  (1.753 m)   Wt 64 kg   SpO2 100%   BMI 20.84 kg/m  Gen:   Awake, no distress, appears uncomfortable  Resp:  Normal effort  MSK:   Moves extremities without difficulty  Other:    Medical Decision Making  Medically screening exam initiated at 5:11 PM.  Appropriate orders placed.  Salaam Battershell was informed that the remainder of the evaluation will be completed by another provider, this initial triage assessment does not replace that evaluation, and the importance of remaining in the ED until their evaluation is complete.     Naoma Diener, PA-C 01/18/21 1713    03/20/21, MD 01/20/21 [phone]

## 2021-01-18 NOTE — ED Notes (Signed)
Pt reported to have left the lobby Pt stickers and BP left in wheelchair

## 2021-01-19 ENCOUNTER — Emergency Department (HOSPITAL_BASED_OUTPATIENT_CLINIC_OR_DEPARTMENT_OTHER)
Admission: EM | Admit: 2021-01-19 | Discharge: 2021-01-20 | Disposition: A | Discharge status: Home / Self Care | Payer: Medicaid Other | Source: Home / Self Care | Attending: Emergency Medicine | Admitting: Emergency Medicine

## 2021-01-19 ENCOUNTER — Encounter (HOSPITAL_BASED_OUTPATIENT_CLINIC_OR_DEPARTMENT_OTHER): Payer: Self-pay

## 2021-01-19 DIAGNOSIS — F17210 Nicotine dependence, cigarettes, uncomplicated: Secondary | ICD-10-CM | POA: Insufficient documentation

## 2021-01-19 DIAGNOSIS — N182 Chronic kidney disease, stage 2 (mild): Secondary | ICD-10-CM | POA: Insufficient documentation

## 2021-01-19 DIAGNOSIS — Z79899 Other long term (current) drug therapy: Secondary | ICD-10-CM | POA: Insufficient documentation

## 2021-01-19 DIAGNOSIS — U071 COVID-19: Secondary | ICD-10-CM | POA: Insufficient documentation

## 2021-01-19 DIAGNOSIS — Z21 Asymptomatic human immunodeficiency virus [HIV] infection status: Secondary | ICD-10-CM | POA: Insufficient documentation

## 2021-01-19 LAB — HEPATIC FUNCTION PANEL
ALT: 24 U/L (ref 0–44)
AST: 28 U/L (ref 15–41)
Albumin: 3.6 g/dL (ref 3.5–5.0)
Alkaline Phosphatase: 90 U/L (ref 38–126)
Bilirubin, Direct: 0.1 mg/dL (ref 0.0–0.2)
Indirect Bilirubin: 0 mg/dL — ABNORMAL LOW (ref 0.3–0.9)
Total Bilirubin: 0.1 mg/dL — ABNORMAL LOW (ref 0.3–1.2)
Total Protein: 8.3 g/dL — ABNORMAL HIGH (ref 6.5–8.1)

## 2021-01-19 LAB — TROPONIN I (HIGH SENSITIVITY): Troponin I (High Sensitivity): 3 ng/L (ref <18)

## 2021-01-19 LAB — URINALYSIS, ROUTINE W REFLEX MICROSCOPIC
Bilirubin Urine: NEGATIVE
Glucose, UA: NEGATIVE mg/dL
Hgb urine dipstick: NEGATIVE
Ketones, ur: NEGATIVE mg/dL
Leukocytes,Ua: NEGATIVE
Nitrite: NEGATIVE
Protein, ur: NEGATIVE mg/dL
Specific Gravity, Urine: >1.03 — ABNORMAL HIGH (ref 1.005–1.030)
pH: 6 (ref 5.0–8.0)

## 2021-01-19 LAB — RAPID URINE DRUG SCREEN, HOSP PERFORMED
Amphetamines: POSITIVE — AB
Barbiturates: NOT DETECTED
Benzodiazepines: NOT DETECTED
Cocaine: NOT DETECTED
Opiates: NOT DETECTED
Tetrahydrocannabinol: NOT DETECTED

## 2021-01-19 LAB — RESP PANEL BY RT-PCR (FLU A&B, COVID) ARPGX2
Influenza A by PCR: NEGATIVE
Influenza B by PCR: NEGATIVE
SARS Coronavirus 2 by RT PCR: POSITIVE — AB

## 2021-01-19 MED ORDER — FAMOTIDINE IN NACL 20-0.9 MG/50ML-% IV SOLN
20.0000 mg | Freq: Once | INTRAVENOUS | Status: DC | PRN
Start: 2021-01-19 — End: 2021-01-20

## 2021-01-19 MED ORDER — ONDANSETRON HCL 4 MG/2ML IJ SOLN
4.0000 mg | Freq: Once | INTRAMUSCULAR | Status: AC
  Administered 2021-01-19: 4 mg via INTRAVENOUS
  Filled 2021-01-19: qty 2

## 2021-01-19 MED ORDER — DIPHENHYDRAMINE HCL 50 MG/ML IJ SOLN: 50.0000 mg | Freq: Once | INTRAMUSCULAR | Status: DC | PRN

## 2021-01-19 MED ORDER — EPINEPHRINE 0.3 MG/0.3ML IJ SOAJ: 0.3000 mg | Freq: Once | INTRAMUSCULAR | Status: DC | PRN

## 2021-01-19 MED ORDER — SODIUM CHLORIDE 0.9 % IV SOLN
INTRAVENOUS | Status: DC | PRN
Start: 2021-01-19 — End: 2021-01-20

## 2021-01-19 MED ORDER — BEBTELOVIMAB 175 MG/2 ML IV (EUA)
175.0000 mg | Freq: Once | INTRAMUSCULAR | Status: AC
  Administered 2021-01-19: 175 mg via INTRAVENOUS
  Filled 2021-01-19: qty 2

## 2021-01-19 MED ORDER — METHYLPREDNISOLONE SODIUM SUCC 125 MG IJ SOLR
125.0000 mg | Freq: Once | INTRAMUSCULAR | Status: DC | PRN
Start: 2021-01-19 — End: 2021-01-20

## 2021-01-19 MED ORDER — SODIUM CHLORIDE 0.9 % IV BOLUS
1000.0000 mL | Freq: Once | INTRAVENOUS | Status: AC
Start: 2021-01-19 — End: 2021-01-19
  Administered 2021-01-19: 1000 mL via INTRAVENOUS

## 2021-01-19 MED ORDER — ALBUTEROL SULFATE HFA 108 (90 BASE) MCG/ACT IN AERS: 2.0000 | INHALATION_SPRAY | Freq: Once | RESPIRATORY_TRACT | Status: DC | PRN

## 2021-01-19 NOTE — ED Notes (Signed)
Piv attempt L AC unsuccessful. Pt requested not to use right arm "b/c it hurts"

## 2021-01-19 NOTE — ED Notes (Signed)
Pt given a urinal and made aware of need for sample. Pt opens eyes then falls back to sleep

## 2021-01-19 NOTE — ED Notes (Signed)
ED Provider at bedside. 

## 2021-01-19 NOTE — ED Provider Notes (Addendum)
[facility] EMERGENCY DEPARTMENT Provider Note   CSN: [id number] Arrival date & time: 01/19/21  1439     History Chief Complaint  Patient presents with   Fatigue    Blake Chambers is a 30 y.o. male with past medical history of bipolar affective, HIV, homelessness that presents to the emerge department today for fatigue and chest pain for the past 2 days.  Patient was seen in the emergency department last night, however left without being seen.  At that time I did MSE him, lab work was done including normal CBC, BMP and troponin.  At that time chest x-ray was also obtained which did not show any acute cardiopulmonary disease.  Per chart review patient's last CD4 620 done 9 days ago.  Patient is extremely sleepy on my exam and this is exactly how he was doing when I am a seen him.  Per chart review patient has been very sleepy in the past while he is been examined as well.  Patient originally states that he did not do any drugs prior to coming in, however after questioning multiple times he did state that he did meth prior to coming in.  States that this is crystal meth, does not, not other drug use.  Patient states that he is still having chest pain, is constant.  Will not provide more information.  Denies any shortness of breath.  Patient also states that he feels extremely dehydrated, states that he has been walking around today, has not had anything to drink.  Patient states that he feels extremely tired as well, states that he has not had any rest in couple days due to the fact that he is homeless.  States that he has been able to find anywhere to sleep.  Patient also states that he has not taken his HIV antivirals in the past year, states that he has a prescription, however has not taken these.  Patient requesting IV fluids, states that he does feel slightly nauseous as well.  Denies abdominal pain, vomiting, diarrhea.  Denies any fevers.  Patient is a poor historian, appears as if this is  been similar in past presentations.  HPI     Past Medical History:  Diagnosis Date   Anxiety    Bipolar affective (HCC)    HIV (human immunodeficiency virus infection) (HCC)    Immune deficiency disorder (HCC)    Mental health disorder    Osteomyelitis of finger of left hand (HCC)    Syphilis     Patient Active Problem List   Diagnosis Date Noted   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   Pain of left forearm    Finger osteomyelitis, left (HCC) 09/11/2020   Family discord 08/13/2020   Perirectal cellulitis 11/01/2017   Abscess 08/20/2017   Perirectal abscess 08/20/2017   Encounter for long-term (current) use of high-risk medication 02/24/2017   Screening examination for venereal disease 10/21/2016   Cocaine use disorder, moderate, dependence (HCC) 10/23/2014   Cannabis use disorder, severe, dependence (HCC) 10/23/2014   Bipolar I disorder, severe, current or most recent episode depressed, with mixed features (HCC) 10/21/2014   Frostbite of both hands 08/01/2014   Tobacco use disorder 07/18/2013   Chronic renal insufficiency, stage II (mild) 11/30/2012   HIV disease (HCC) 02/13/2011   Syphilis 02/13/2011   Bipolar I disorder (HCC) 02/10/2011    Past Surgical History:  Procedure Laterality Date   IRRIGATION AND DEBRIDEMENT ABSCESS N/A 08/20/2017   Procedure: IRRIGATION AND DEBRIDEMENT PERI  RECTAL ABSCESS;  Surgeon: Andria Meuse, MD;  Location: [facility] OR;  Service: General;  Laterality: N/A;       Family History  Family history unknown: Yes    Social History   Tobacco Use   Smoking status: Every Day    Packs/day: 0.30    Types: Cigarettes   Smokeless tobacco: Never   Tobacco comments:    pt. trying to quit  Vaping Use   Vaping Use: Never used  Substance Use Topics   Alcohol use: No    Alcohol/week: 1.0 standard drink    Types: 1 Standard drinks or equivalent per week   Drug use: Not Currently    Frequency: 1.0 times per week    Types:  Marijuana, Cocaine, Methamphetamines    Home Medications Prior to Admission medications   Medication Sig Start Date End Date Taking? Authorizing Provider  ibuprofen (ADVIL) 600 MG tablet Take 1 tablet (600 mg total) by mouth every 6 (six) hours as needed. 11/08/20   Jacalyn Lefevre, MD    Allergies    Tenofovir disoproxil  Review of Systems   Review of Systems  Constitutional:  Positive for fatigue. Negative for chills, diaphoresis and fever.  HENT:  Negative for congestion, sore throat and trouble swallowing.   Eyes:  Negative for pain and visual disturbance.  Respiratory:  Negative for cough, shortness of breath and wheezing.   Cardiovascular:  Positive for chest pain. Negative for palpitations and leg swelling.  Gastrointestinal:  Positive for nausea. Negative for abdominal distention, abdominal pain, diarrhea and vomiting.  Genitourinary:  Negative for difficulty urinating.  Musculoskeletal:  Negative for back pain, neck pain and neck stiffness.  Skin:  Negative for pallor.  Neurological:  Negative for dizziness, speech difficulty, weakness and headaches.  Psychiatric/Behavioral:  Negative for confusion.    Physical Exam Updated Vital Signs BP 117/81   Pulse 71   Temp 98.9 F (37.2 C) (Oral)   Resp 13   Ht 5\' 9"  (1.753 m)   Wt 64 kg   SpO2 100%   BMI 20.84 kg/m   Physical Exam Constitutional:      General: He is not in acute distress.    Appearance: Normal appearance. He is not ill-appearing, toxic-appearing or diaphoretic.     Comments: Patient is extremely sleepy, however will wake up for exam and respond appropriately.  HENT:     Mouth/Throat:     Mouth: Mucous membranes are moist.     Pharynx: Oropharynx is clear.  Eyes:     General: No scleral icterus.    Extraocular Movements: Extraocular movements intact.     Pupils: Pupils are equal, round, and reactive to light.     Comments: Pupils are constricted.  Cardiovascular:     Rate and Rhythm: Normal rate  and regular rhythm.     Pulses: Normal pulses.     Heart sounds: Normal heart sounds.  Pulmonary:     Effort: Pulmonary effort is normal. No respiratory distress.     Breath sounds: Normal breath sounds. No stridor. No wheezing, rhonchi or rales.  Chest:     Chest wall: No tenderness.  Abdominal:     General: Abdomen is flat. There is no distension.     Palpations: Abdomen is soft.     Tenderness: There is no abdominal tenderness. There is no guarding or rebound.     Comments: No TTP to abdomen   Musculoskeletal:        General: No swelling or tenderness.  Normal range of motion.     Cervical back: Normal range of motion and neck supple. No rigidity.     Right lower leg: No edema.     Left lower leg: No edema.  Skin:    General: Skin is warm and dry.     Capillary Refill: Capillary refill takes less than 2 seconds.     Coloration: Skin is not pale.  Neurological:     General: No focal deficit present.     Mental Status: He is alert and oriented to person, place, and time.  Psychiatric:        Mood and Affect: Mood normal.        Behavior: Behavior normal.    ED Results / Procedures / Treatments   Labs (all labs ordered are listed, but only abnormal results are displayed) Labs Reviewed  RESP PANEL BY RT-PCR (FLU A&B, COVID) ARPGX2 - Abnormal; Notable for the following components:      Result Value   SARS Coronavirus 2 by RT PCR POSITIVE (*)    All other components within normal limits  URINALYSIS, ROUTINE W REFLEX MICROSCOPIC - Abnormal; Notable for the following components:   Specific Gravity, Urine >1.030 (*)    All other components within normal limits  HEPATIC FUNCTION PANEL - Abnormal; Notable for the following components:   Total Protein 8.3 (*)    Total Bilirubin 0.1 (*)    Indirect Bilirubin 0.0 (*)    All other components within normal limits  RAPID URINE DRUG SCREEN, HOSP PERFORMED  TROPONIN I (HIGH SENSITIVITY)    EKG EKG  Interpretation  Date/Time:  Saturday January 19 2021 15:03:42 EDT Ventricular Rate:  81 PR Interval:  130 QRS Duration: 90 QT Interval:  393 QTC Calculation: 415 R Axis:   58 Text Interpretation: Sinus rhythm Atrial premature complexes Confirmed by Benjiman Core [phone]) on 01/19/2021 9:36:33 PM  Radiology DG Chest Port 1 View  Result Date: 01/18/2021 CLINICAL DATA:  Chest pain EXAM: PORTABLE CHEST 1 VIEW COMPARISON:  03/09/2015 FINDINGS: The heart size and mediastinal contours are within normal limits. Both lungs are clear. The visualized skeletal structures are unremarkable. IMPRESSION: No active disease. Electronically Signed   By: Jasmine Pang M.D.   On: 01/18/2021 18:19    Procedures Procedures   Medications Ordered in ED Medications  0.9 %  sodium chloride infusion (has no administration in time range)  diphenhydrAMINE (BENADRYL) injection 50 mg (has no administration in time range)  famotidine (PEPCID) IVPB 20 mg premix (has no administration in time range)  methylPREDNISolone sodium succinate (SOLU-MEDROL) 125 mg/2 mL injection 125 mg (has no administration in time range)  albuterol (VENTOLIN HFA) 108 (90 Base) MCG/ACT inhaler 2 puff (has no administration in time range)  EPINEPHrine (EPI-PEN) injection 0.3 mg (has no administration in time range)  sodium chloride 0.9 % bolus 1,000 mL (0 mLs Intravenous Stopped 01/19/21 1754)  ondansetron (ZOFRAN) injection 4 mg (4 mg Intravenous Given 01/19/21 1653)  bebtelovimab EUA injection SOLN 175 mg (175 mg Intravenous Given 01/19/21 2000)    ED Course  I have reviewed the triage vital signs and the nursing notes.  Pertinent labs & imaging results that were available during my care of the patient were reviewed by me and considered in my medical decision making (see chart for details).    MDM Rules/Calculators/A&P                          Patient  presents emerged department today for fatigue and chest pain for the past 2 days.   Patient appears extremely sleepy, does admit to doing meth prior to arrival.  Patient is homeless, states that he is been out walking around for the last 2 days without any sleep.  Patient is a poor historian, appears as if patient has been this way in the past.  Physical exam is benign, patient [facility] and at that time CBC, BMP and troponin were negative.  Patient states that chest pain has been consistent since then, will obtain second troponin here, urinalysis and UDS.  If patient is still extremely sleepy will consider Narcan?  Patient without any respiratory distress.  EKG interpreted me without any signs of ischemia.  Chest pain could be multifactorial, however do not favor ACS at this time.  Could also be due to meth use.   Work-up today with COVID positive which most likely explains patient's symptoms, upon reevaluation patient appears much more awake and alert.  No need for admission at this time, patient appears well.  Did speak to pharmacy since patient is immunocompromise and homeless, we did decide on immunotherapy at this time for COVID.  Patient agreeable.  Upon reevaluation, patient to be discharged however he states that he wants help finding shelter, will be difficult at this time since patient is COVID-positive.  Social worker consult placed.  I think this is appropriate at this time since patient is homeless without HIV medications and recent COVID.  Did discuss this case with my attending who agrees. Pt can be discharged once social worker consult sees pt in the morning. Pt will board overnight until social worker consult in the morning.  Patient placed under provider Default.   I discussed this case with my attending physician who cosigned this note including patient's presenting symptoms, physical exam, and planned diagnostics and interventions. Attending physician stated agreement with plan or made changes to plan which were implemented.   Attending  physician assessed patient at bedside.  Final Clinical Impression(s) / ED Diagnoses Final diagnoses:  COVID    Rx / DC Orders ED Discharge Orders     None        Farrel Gordonatel, Xyon Lukasik, PA-C 01/19/21 2215    Benjiman CorePickering, Nathan, MD 01/20/21 0007    Farrel GordonPatel, Wally Behan, PA-C 01/20/21 0016    Benjiman CorePickering, Nathan, MD 01/20/21 [phone]

## 2021-01-19 NOTE — ED Notes (Signed)
Pt given information sheet, asked this RN to explain it; this RN went through the papers, pt denies any allergies and would like the medication

## 2021-01-19 NOTE — ED Triage Notes (Addendum)
Pt c/o fatigue and chest pain x2 days. States he is dehydrated. Was seen at [facility] last night with normal labwork. Pt falling asleep during triage

## 2021-01-19 NOTE — ED Notes (Signed)
Pt monitored for post admin, no changes noted, pt sleeping

## 2021-01-19 NOTE — ED Notes (Signed)
Pt called for room with no answer. 

## 2021-01-19 NOTE — Discharge Instructions (Addendum)
If you are in need of a shelter please call [facility] ([facility]) at [phone] between the hours of 9am-5pm Mon-Fri.  [facility] will contact all the local shelters to find openings.  Right now they are requiring people to quarantine at a hotel before they can go to an open bed but [facility] may be able to set that up as well.  They are not open on weekends.  On Monday-Friday morning at 8 am until 1 pm, you can also go to the [facility] (see below) to seek shelter in the Hotel Quarantine Program prior to entering a shelter. You can also call the number provided (see the above paragraph) to seek placement into the program by calling [facility].   [facility] [address] [place], [place] [address] Phone: [phone] Fax: [phone]  For Free Breakfasts and Lunches 7 days a week you can go to: [facility] [address], [place], [place] [address] Hours: Open today  8AM-5PM Phone: [phone] Breakfast: 6:30-7:30 am Lunch served: 10:40 am - 12:40pm         [facility]) M-F 8am-3pm   [address] [place], [place] [address]   [phone] Services include: laundry, barbering, support groups, case management, phone  & computer access, showers, AA/NA mtgs, mental health/substance abuse nurse, job skills class, disability information, VA assistance, spiritual classes, etc.   [facility] [address] [place], [place]   [phone] Provides breakfast each weekday morning except Wednesdays, and an evening community meal every Friday. Access to showers is available during breakfast hours and telephones for seeking work are also provided. Also offers job referral and counseling for the homeless and unemployed.  [facility]   [facility] [address]     [facility] [place], [place] [address]     [address], [place]  [phone]      [phone]  [facility]   [facility] [address]    [address] [place] [address]     [place], [place] [address] [phone]       [phone]  [facility] (women only) [address] [place], [place] [address] [phone]  [facility] [place] (overflow shelter: [address] [place], [place] check in at 6pm for placement in local shelter).  [address], [place], [place] [address] Phone: [phone]  [facility]ris[facility]     [facility]   Crisis Services      [place] [facility] [phone]      [phone] [address]     [address] [place],  [address]     [place],  [address]    [facility] - [phone]  

## 2021-01-20 NOTE — ED Notes (Signed)
Spoke with social worker/ Transitional Care Team, very limited resources are available on weekends, also for Covid Positive Clients. Will speak with pt regards to DC plans

## 2021-01-20 NOTE — TOC Initial Note (Signed)
Transition of Care [facility]) - Initial/Assessment Note    Patient Details  Name: Blake Chambers MRN: [id number] Date of Birth: March 26, 1991  Transition of Care [facility]) CM/SW Contact:    Annalee Genta, LCSW Phone Number: 01/20/2021, 8:48 AM  Clinical Narrative:                 CSW received consult for homelessness and spoke with RN. Patient COVID positive, SU history, homless per chart. CSW has limited homeless supports over the weekend and added resources to patient's AVS. At this time CSW is unaware of COVID quarantine program still operating. CSW notes RN reports he will speak with patient and CSW will follow if possible DC supports are identified.       Patient Goals and CMS Choice        Expected Discharge Plan and Services                                                Prior Living Arrangements/Services                       Activities of Daily Living      Permission Sought/Granted                  Emotional Assessment              Admission diagnosis:  Heat Exposure; Fatigue Patient Active Problem List   Diagnosis Date Noted   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   Pain of left forearm    Finger osteomyelitis, left (HCC) 09/11/2020   Family discord 08/13/2020   Perirectal cellulitis 11/01/2017   Abscess 08/20/2017   Perirectal abscess 08/20/2017   Encounter for long-term (current) use of high-risk medication 02/24/2017   Screening examination for venereal disease 10/21/2016   Cocaine use disorder, moderate, dependence (HCC) 10/23/2014   Cannabis use disorder, severe, dependence (HCC) 10/23/2014   Bipolar I disorder, severe, current or most recent episode depressed, with mixed features (HCC) 10/21/2014   Frostbite of both hands 08/01/2014   Tobacco use disorder 07/18/2013   Chronic renal insufficiency, stage II (mild) 11/30/2012   HIV disease (HCC) 02/13/2011   Syphilis 02/13/2011   Bipolar I disorder (HCC) 02/10/2011    PCP:  Inc, [facility]:   [id number] - [place], [place] - [address] AT [facility] [address] [place] [address] Phone: [phone] Fax: [phone]     Social Determinants of Health (SDOH) Interventions    Readmission Risk Interventions No flowsheet data found.

## 2021-01-23 ENCOUNTER — Encounter: Payer: Medicaid Other | Admitting: Internal Medicine

## 2021-01-23 ENCOUNTER — Other Ambulatory Visit: Payer: Self-pay

## 2021-01-23 ENCOUNTER — Ambulatory Visit (HOSPITAL_COMMUNITY)
Admission: EM | Admit: 2021-01-23 | Discharge: 2021-01-23 | Disposition: A | Discharge status: Home / Self Care | Payer: Medicaid Other | Attending: Psychiatry | Admitting: Psychiatry

## 2021-01-23 DIAGNOSIS — Z9151 Personal history of suicidal behavior: Secondary | ICD-10-CM | POA: Insufficient documentation

## 2021-01-23 DIAGNOSIS — Z21 Asymptomatic human immunodeficiency virus [HIV] infection status: Secondary | ICD-10-CM | POA: Insufficient documentation

## 2021-01-23 DIAGNOSIS — Z5900 Homelessness unspecified: Secondary | ICD-10-CM | POA: Insufficient documentation

## 2021-01-23 NOTE — ED Provider Notes (Signed)
Behavioral Health Urgent Care Medical Screening Exam  Patient Name: Blake Chambers MRN: [id number] Date of Evaluation: 01/23/21 Chief Complaint:   Diagnosis:  Final diagnoses:  Homelessness    History of Present illness: Blake Chambers is a 30 y.o. male.  Patient states "I came here to get myself situated, I am trying to get my life back on track, I have been homeless for 2 years." Blake Chambers reports he was at [employer] station today when the police were called related to his larceny of a milkshake and a sandwich.  He does have an upcoming court date on 02/08/2021 for this larceny.  Once police arrived he asked for transport to [facility] behavioral health.  He states he has recently been homeless and sleeping [place] a [place].  He reports he lost his disability benefit approximately 1.5 years ago after a payee failed to manage benefit properly.   Blake Chambers plans to find out how to have disability restarted.  He is aware of COVID quarantine, once quarantine is completed he can follow-up with homeless shelter.  He reports frustration that "the beds are always full." Blake Chambers reports history of ADD, ADHD, depression and PTSD.  He is not currently followed by outpatient psychiatry and currently does not take any medications daily.  He is unable to recall any recent medications states "none of them worked anyway."  Discussed follow-up with outpatient psychiatry here at [facility] behavioral health. He is also followed by [facility] for HIV diagnosis, reports he missed appointment today.  He states he does not take medications, refuses to elaborate on reasons for not taking medications with this Clinical research associate. Patient is assessed face-to-face by nurse practitioner.  He is seated in assessment area, no acute distress.He is alert and oriented, pleasant and cooperative during assessment. He reports euthymic mood with congruent affect. He denies suicidal and homicidal ideations.  He endorses history of suicide  attempts, states "I have attempted 8 or 9 times, last attempt 4 years ago."  He denies history of self-harm. He contracts verbally for safety with this Clinical research associate.He has normal speech and behavior. He denies both auditory and visual hallucinations.  Patient is able to converse coherently with goal-directed thoughts and no distractibility or preoccupation.  He denies paranoia.  Objectively there is no evidence of psychosis/mania or delusional thinking. Blake Chambers is currently homeless in [place].  He denies access to weapons.  He is currently not employed and seeking reinstatement of disability benefit.  He denies alcohol and substance use.  He endorses average sleep and appetite.  Patient offered support and encouragement.  He denies any person to contact for collateral information at this time.  He endorses difficult relationship with his family currently.  Social work Scientist, clinical (histocompatibility and immunogenetics) with Harriett Sine prior to discharge to discuss housing resources and additional primary care and mental health resources.     Psychiatric Specialty Exam  Presentation  General Appearance:Appropriate for Environment; Casual  Eye Contact:Fair  Speech:Clear and Coherent; Normal Rate  Speech Volume:Normal  Handedness:Right   Mood and Affect  Mood:Euthymic  Affect:Appropriate; Congruent   Thought Process  Thought Processes:Coherent; Linear; Goal Directed  Descriptions of Associations:Intact  Orientation:Full (Time, Place and Person)  Thought Content:Logical; WDL    Hallucinations:None  Ideas of Reference:None  Suicidal Thoughts:No  Homicidal Thoughts:No   Sensorium  Memory:Immediate Good; Recent Fair; Remote Fair  Judgment:Fair  Insight:Shallow   Executive Functions  Concentration:Good  Attention Span:Good  Recall:Good  Fund of Knowledge:Good  Language:Good   Psychomotor Activity  Psychomotor Activity:Normal  Assets  Assets:Communication Skills; Desire for Improvement; Intimacy;  Leisure Time; Physical Health; Resilience; Social Support   Sleep  Sleep:Fair  Number of hours:  No data recorded  Nutritional Assessment (For OBS and FBC admissions only) Has the patient had a weight loss or gain of 10 pounds or more in the last 3 months?: No Has the patient had a decrease in food intake/or appetite?: No Does the patient have dental problems?: No Does the patient have eating habits or behaviors that may be indicators of an eating disorder including binging or inducing vomiting?: No Has the patient recently lost weight without trying?: No Has the patient been eating poorly because of a decreased appetite?: No Malnutrition Screening Tool Score: 0   Physical Exam: Physical Exam Vitals and nursing note reviewed.  Constitutional:      Appearance: Normal appearance. He is well-developed and normal weight.  HENT:     Head: Normocephalic and atraumatic.     Nose: Nose normal.  Cardiovascular:     Rate and Rhythm: Normal rate.  Pulmonary:     Effort: Pulmonary effort is normal.  Musculoskeletal:        General: Normal range of motion.     Cervical back: Normal range of motion.  Neurological:     Mental Status: He is alert and oriented to person, place, and time. Mental status is at baseline.  Psychiatric:        Attention and Perception: Attention and perception normal.        Mood and Affect: Mood and affect normal.        Speech: Speech normal.        Behavior: Behavior normal. Behavior is cooperative.        Thought Content: Thought content normal.        Cognition and Memory: Cognition and memory normal.        Judgment: Judgment normal.   Review of Systems  Constitutional: Negative.   HENT: Negative.    Eyes: Negative.   Respiratory: Negative.    Cardiovascular: Negative.   Gastrointestinal: Negative.   Genitourinary: Negative.   Musculoskeletal: Negative.   Skin: Negative.   Neurological: Negative.   Endo/Heme/Allergies: Negative.    Psychiatric/Behavioral: Negative.    Blood pressure 122/78, pulse 96, temperature 98.1 F (36.7 C), temperature source Tympanic, resp. rate 16, SpO2 99 %. There is no height or weight on file to calculate BMI.  Musculoskeletal: Strength & Muscle Tone: within normal limits Gait & Station: normal Patient leans: N/A   [facility] MSE Discharge Disposition for Follow up and Recommendations: Based on my evaluation the patient does not appear to have an emergency medical condition and can be discharged with resources and follow up care in outpatient services for Medication Management and Individual Therapy Patient reviewed with Dr. Bronwen Betters. Follow up with outpatient psychiatry at [facility]. Follow-up with primary care provider at [facility].  Lenard Lance, FNP 01/23/2021, 4:22 PM

## 2021-01-23 NOTE — Discharge Instructions (Addendum)
Patient is instructed prior to discharge to: ? Take all medications as prescribed by his/her mental healthcare provider. ?Report any adverse effects and or reactions from the medicines to his/her outpatient provider promptly. ?Keep all scheduled appointments, to ensure that you are getting refills on time and to avoid any interruption in your medication.  If you are unable to keep an appointment call to reschedule.  Be sure to follow-up with resources and follow-up appointments provided.  ?Patient has been instructed & cautioned: To not engage in alcohol and or illegal drug use while on prescription medicines. ?In the event of worsening symptoms, patient is instructed to call the crisis hotline, 911 and or go to the nearest ED for appropriate evaluation and treatment of symptoms. ?To follow-up with his/her primary care provider for your other medical issues, concerns and or health care needs. ?  ? ?Homeless Shelter List: ? ?  ? ?[facility]) ? ?[address] [place], [place] ? ?Phone: [phone] ? ?  ? ?[facility] ? ?[address], [place], [place] [address] ? ?Phone: [phone] ? ?  ? ?[facility] (Women only) ? ?[address], [place], [place] [address] ? ?Phone: [phone] ? ?  ? ?[facility] ? ?[address] [place], [place] [address] ? ?Phone: [phone] ? ?  ? ?[facility]: ? ?[address] ? ?, [place] [address] ? ?Phone: [phone] ? ?  ? ?[facility] ? ?[address], [place], [place] [address] ? ?(Check in at 6:00PM for placement at a local shelter) ? ?Phone: [phone] ? ?

## 2021-01-28 ENCOUNTER — Encounter (HOSPITAL_COMMUNITY): Payer: Self-pay

## 2021-01-28 ENCOUNTER — Emergency Department (HOSPITAL_COMMUNITY)
Admission: EM | Admit: 2021-01-28 | Discharge: 2021-01-28 | Disposition: A | Discharge status: Home / Self Care | Payer: Medicaid Other | Attending: Student | Admitting: Student

## 2021-01-28 ENCOUNTER — Other Ambulatory Visit: Payer: Self-pay

## 2021-01-28 DIAGNOSIS — M79605 Pain in left leg: Secondary | ICD-10-CM | POA: Insufficient documentation

## 2021-01-28 DIAGNOSIS — M79604 Pain in right leg: Secondary | ICD-10-CM | POA: Insufficient documentation

## 2021-01-28 DIAGNOSIS — M62838 Other muscle spasm: Secondary | ICD-10-CM | POA: Diagnosis not present

## 2021-01-28 DIAGNOSIS — N182 Chronic kidney disease, stage 2 (mild): Secondary | ICD-10-CM | POA: Insufficient documentation

## 2021-01-28 DIAGNOSIS — F17210 Nicotine dependence, cigarettes, uncomplicated: Secondary | ICD-10-CM | POA: Diagnosis not present

## 2021-01-28 DIAGNOSIS — Z21 Asymptomatic human immunodeficiency virus [HIV] infection status: Secondary | ICD-10-CM | POA: Diagnosis not present

## 2021-01-28 MED ORDER — IBUPROFEN 200 MG PO TABS
600.0000 mg | ORAL_TABLET | Freq: Once | ORAL | Status: AC
  Administered 2021-01-28: 600 mg via ORAL
  Filled 2021-01-28: qty 3

## 2021-01-28 MED ORDER — KETOROLAC TROMETHAMINE 30 MG/ML IJ SOLN
30.0000 mg | Freq: Once | INTRAMUSCULAR | Status: DC
Start: 2021-01-28 — End: 2021-01-28
  Filled 2021-01-28: qty 1

## 2021-01-28 NOTE — Discharge Instructions (Addendum)
Take Tylenol ibuprofen as needed for pain. These are muscle cramps, they will likely resolve on their own.  Please read the attached information about preventing muscle cramps and spasms. If things worsen you can return back to the ED as needed for reevaluation.

## 2021-01-28 NOTE — ED Notes (Signed)
Pt requesting something PO instead of IM, PA made aware

## 2021-01-28 NOTE — ED Triage Notes (Signed)
Pt BIB EMS. Pt complains of bilateral leg pain and muscle spasms. Pt was picked up from [facility] on [address].

## 2021-01-28 NOTE — ED Provider Notes (Signed)
[facility]-EMERGENCY DEPT Provider Note   CSN: [id number] Arrival date & time: 01/28/21  0155     History Chief Complaint  Patient presents with   Spasms    Blake Chambers is a 30 y.o. male.  HPI  Patient presents with muscle spasms x2 days.  States the pain is constant, worse with ambulation.  Pain is primarily to the legs bilaterally.  He has not tried any alleviating factors.  Patient states he has been walking a lot, states this could be a cause of his pain.  He denies any fever, drug use, dysuria, abdominal pain, nausea, vomiting, recent accidents or trauma.      Past Medical History:  Diagnosis Date   Anxiety    Bipolar affective (HCC)    HIV (human immunodeficiency virus infection) (HCC)    Immune deficiency disorder (HCC)    Mental health disorder    Osteomyelitis of finger of left hand (HCC)    Syphilis     Patient Active Problem List   Diagnosis Date Noted   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   Pain of left forearm    Finger osteomyelitis, left (HCC) 09/11/2020   Family discord 08/13/2020   Perirectal cellulitis 11/01/2017   Abscess 08/20/2017   Perirectal abscess 08/20/2017   Encounter for long-term (current) use of high-risk medication 02/24/2017   Screening examination for venereal disease 10/21/2016   Cocaine use disorder, moderate, dependence (HCC) 10/23/2014   Cannabis use disorder, severe, dependence (HCC) 10/23/2014   Bipolar I disorder, severe, current or most recent episode depressed, with mixed features (HCC) 10/21/2014   Frostbite of both hands 08/01/2014   Tobacco use disorder 07/18/2013   Chronic renal insufficiency, stage II (mild) 11/30/2012   HIV disease (HCC) 02/13/2011   Syphilis 02/13/2011   Bipolar I disorder (HCC) 02/10/2011    Past Surgical History:  Procedure Laterality Date   IRRIGATION AND DEBRIDEMENT ABSCESS N/A 08/20/2017   Procedure: IRRIGATION AND DEBRIDEMENT PERI RECTAL ABSCESS;   Surgeon: Andria Meuse, MD;  Location: [facility] OR;  Service: General;  Laterality: N/A;       Family History  Family history unknown: Yes    Social History   Tobacco Use   Smoking status: Every Day    Packs/day: 0.30    Types: Cigarettes   Smokeless tobacco: Never   Tobacco comments:    pt. trying to quit  Vaping Use   Vaping Use: Never used  Substance Use Topics   Alcohol use: No    Alcohol/week: 1.0 standard drink    Types: 1 Standard drinks or equivalent per week   Drug use: Not Currently    Frequency: 1.0 times per week    Types: Marijuana, Cocaine, Methamphetamines    Home Medications Prior to Admission medications   Medication Sig Start Date End Date Taking? Authorizing Provider  ibuprofen (ADVIL) 600 MG tablet Take 1 tablet (600 mg total) by mouth every 6 (six) hours as needed. 11/08/20   Jacalyn Lefevre, MD    Allergies    Tenofovir disoproxil  Review of Systems   Review of Systems  Constitutional:  Negative for chills and fever.  Respiratory:  Negative for shortness of breath.   Cardiovascular:  Negative for chest pain.  Musculoskeletal:  Positive for myalgias. Negative for back pain.   Physical Exam Updated Vital Signs BP 114/75 (BP Location: Right Arm)   Pulse 67   Temp 98.3 F (36.8 C) (Oral)   Resp 18   SpO2 100%  Physical Exam Vitals and nursing note reviewed. Exam conducted with a chaperone present.  Constitutional:      General: He is not in acute distress.    Appearance: Normal appearance.     Comments: Patient is resting comfortably, no acute distress.  HENT:     Head: Normocephalic and atraumatic.  Eyes:     General: No scleral icterus.    Extraocular Movements: Extraocular movements intact.     Pupils: Pupils are equal, round, and reactive to light.  Cardiovascular:     Rate and Rhythm: Normal rate and regular rhythm.     Pulses: Normal pulses.     Heart sounds: Normal heart sounds.  Musculoskeletal:        General: No  swelling or tenderness. Normal range of motion.     Comments: Patient is ambulatory without issues.  Legs are roughly symmetric, no reproducible tenderness.  Skin:    Capillary Refill: Capillary refill takes less than 2 seconds.     Coloration: Skin is not jaundiced.  Neurological:     Mental Status: He is alert. Mental status is at baseline.     Cranial Nerves: No cranial nerve deficit.     Coordination: Coordination normal.     Comments: Cranial nerves III through XII are grossly intact.   ED Results / Procedures / Treatments   Labs (all labs ordered are listed, but only abnormal results are displayed) Labs Reviewed - No data to display  EKG None  Radiology No results found.  Procedures Procedures   Medications Ordered in ED Medications - No data to display  ED Course  I have reviewed the triage vital signs and the nursing notes.  Pertinent labs & imaging results that were available during my care of the patient were reviewed by me and considered in my medical decision making (see chart for details).    MDM Rules/Calculators/A&P                           Patient vitals are stable, his physical exam is largely unremarkable.  I suspect his pain is musculoskeletal from walking.  Pulses are 2+, I do not suspect a vascular pathology.  He has history of multiple visits for the same concern.  I doubt he has having any rhabdomyolysis. I do not think further laboratory work-up is warranted.    He was seen 01/23/21 and evaluated by psychiatry.  He was given outpatient resources for homelessness.  Will give patient a shot of pain medicine and discharged with symptomatic management.  Final Clinical Impression(s) / ED Diagnoses Final diagnoses:  None    Rx / DC Orders ED Discharge Orders     None        Theron Arista, PA-C 01/28/21 0820    Glendora Score, MD 01/28/21 [phone]

## 2021-01-31 ENCOUNTER — Emergency Department (HOSPITAL_BASED_OUTPATIENT_CLINIC_OR_DEPARTMENT_OTHER)
Admission: EM | Admit: 2021-01-31 | Discharge: 2021-02-01 | Disposition: A | Discharge status: Home / Self Care | Payer: Medicaid Other | Attending: Emergency Medicine | Admitting: Emergency Medicine

## 2021-01-31 ENCOUNTER — Encounter (HOSPITAL_BASED_OUTPATIENT_CLINIC_OR_DEPARTMENT_OTHER): Payer: Self-pay | Admitting: *Deleted

## 2021-01-31 ENCOUNTER — Other Ambulatory Visit: Payer: Self-pay

## 2021-01-31 DIAGNOSIS — M79604 Pain in right leg: Secondary | ICD-10-CM | POA: Diagnosis not present

## 2021-01-31 DIAGNOSIS — Z5900 Homelessness unspecified: Secondary | ICD-10-CM | POA: Diagnosis not present

## 2021-01-31 DIAGNOSIS — T730XXA Starvation, initial encounter: Secondary | ICD-10-CM | POA: Diagnosis not present

## 2021-01-31 DIAGNOSIS — Z21 Asymptomatic human immunodeficiency virus [HIV] infection status: Secondary | ICD-10-CM | POA: Insufficient documentation

## 2021-01-31 DIAGNOSIS — F17210 Nicotine dependence, cigarettes, uncomplicated: Secondary | ICD-10-CM | POA: Insufficient documentation

## 2021-01-31 DIAGNOSIS — M79605 Pain in left leg: Secondary | ICD-10-CM | POA: Diagnosis not present

## 2021-01-31 LAB — COMPREHENSIVE METABOLIC PANEL
ALT: 22 U/L (ref 0–44)
AST: 29 U/L (ref 15–41)
Albumin: 3.7 g/dL (ref 3.5–5.0)
Alkaline Phosphatase: 74 U/L (ref 38–126)
Anion gap: 7 (ref 5–15)
BUN: 12 mg/dL (ref 6–20)
CO2: 25 mmol/L (ref 22–32)
Calcium: 8.8 mg/dL — ABNORMAL LOW (ref 8.9–10.3)
Chloride: 101 mmol/L (ref 98–111)
Creatinine, Ser: 1.06 mg/dL (ref 0.61–1.24)
GFR, Estimated: >60 mL/min (ref >60)
Glucose, Bld: 85 mg/dL (ref 70–99)
Potassium: 3.9 mmol/L (ref 3.5–5.1)
Sodium: 133 mmol/L — ABNORMAL LOW (ref 135–145)
Total Bilirubin: 0.6 mg/dL (ref 0.3–1.2)
Total Protein: 8.6 g/dL — ABNORMAL HIGH (ref 6.5–8.1)

## 2021-01-31 LAB — CBC WITH DIFFERENTIAL/PLATELET
Abs Immature Granulocytes: 0.01 10*3/uL (ref 0.00–0.07)
Basophils Absolute: 0 10*3/uL (ref 0.0–0.1)
Basophils Relative: 1 %
Eosinophils Absolute: 0.1 10*3/uL (ref 0.0–0.5)
Eosinophils Relative: 2 %
HCT: 40.7 % (ref 39.0–52.0)
Hemoglobin: 13.5 g/dL (ref 13.0–17.0)
Immature Granulocytes: 0 %
Lymphocytes Relative: 30 %
Lymphs Abs: 2 10*3/uL (ref 0.7–4.0)
MCH: 26.6 pg (ref 26.0–34.0)
MCHC: 33.2 g/dL (ref 30.0–36.0)
MCV: 80.1 fL (ref 80.0–100.0)
Monocytes Absolute: 0.8 10*3/uL (ref 0.1–1.0)
Monocytes Relative: 12 %
Neutro Abs: 3.7 10*3/uL (ref 1.7–7.7)
Neutrophils Relative %: 55 %
Platelets: 278 10*3/uL (ref 150–400)
RBC: 5.08 MIL/uL (ref 4.22–5.81)
RDW: 15.9 % — ABNORMAL HIGH (ref 11.5–15.5)
WBC: 6.6 10*3/uL (ref 4.0–10.5)
nRBC: 0 % (ref 0.0–0.2)

## 2021-01-31 LAB — CK: Total CK: 432 U/L — ABNORMAL HIGH (ref 49–397)

## 2021-01-31 MED ORDER — KETOROLAC TROMETHAMINE 15 MG/ML IJ SOLN
15.0000 mg | Freq: Once | INTRAMUSCULAR | Status: AC
  Administered 2021-01-31: 15 mg via INTRAVENOUS
  Filled 2021-01-31: qty 1

## 2021-01-31 MED ORDER — SODIUM CHLORIDE 0.9 % IV BOLUS
1000.0000 mL | Freq: Once | INTRAVENOUS | Status: AC
  Administered 2021-01-31: 1000 mL via INTRAVENOUS

## 2021-01-31 NOTE — ED Provider Notes (Signed)
[facility]-EM[facility]Y [facility]PT [facility] Provider Note: Lowella Dell, MD, FACEP  CSN: [id number] MRN: [id number] ARRIVAL: 01/31/21 at 2106 ROOM: MH03/MH03   CHIEF COMPLAINT  Leg Pain   HISTORY OF PRESENT ILLNESS  01/31/21 10:52 PM Blake Chambers is a 29 y.o. male who is homeless and has mental health issues.  He has had frequent visits to the emergency department for usually minor complaints.  He is here complaining of pain in both legs for a week.  This is a frequent complaint for him.  Being homeless, he relies on ambulation on a daily basis.  He states the pain in his legs is an 8 out of 10 and it is worse with walking.  His only other acute complaint apart from being hungry is that he has some discomfort in his face which he likens to a tooth ache but localizes it in the center of his face.  Prior to my arrival an order was given for an IV fluid bolus and he was given a meal.  He is requesting seconds.   Past Medical History:  Diagnosis Date   Anxiety    Bipolar affective (HCC)    HIV (human immunodeficiency virus infection) (HCC)    Immune deficiency disorder (HCC)    Mental health disorder    Osteomyelitis of finger of left hand (HCC)    Syphilis     Past Surgical History:  Procedure Laterality Date   IRRIGATION AND DEBRIDEMENT ABSCESS N/A 08/20/2017   Procedure: IRRIGATION AND DEBRIDEMENT PERI RECTAL ABSCESS;  Surgeon: Andria Meuse, MD;  Location: [facility] OR;  Service: General;  Laterality: N/A;    Family History  Family history unknown: Yes    Social History   Tobacco Use   Smoking status: Every Day    Packs/day: 0.30    Types: Cigarettes   Smokeless tobacco: Never   Tobacco comments:    pt. trying to quit  Vaping Use   Vaping Use: Never used  Substance Use Topics   Alcohol use: No    Alcohol/week: 1.0 standard drink    Types: 1 Standard drinks or equivalent per week   Drug use: Not Currently    Frequency: 1.0 times per week    Types: Marijuana, Cocaine,  Methamphetamines    Prior to Admission medications   Medication Sig Start Date End Date Taking? Authorizing Provider  ibuprofen (ADVIL) 600 MG tablet Take 1 tablet (600 mg total) by mouth every 6 (six) hours as needed. 11/08/20   Jacalyn Lefevre, MD    Allergies Tenofovir disoproxil   REVIEW OF SYSTEMS  Negative except as noted here or in the History of Present Illness.   PHYSICAL EXAMINATION  Initial Vital Signs Blood pressure 119/84, pulse 93, temperature 98.2 F (36.8 C), temperature source Oral, resp. rate 20, height 5\' 9"  (1.753 m), weight 64 kg, SpO2 99 %.  Examination General: Well-developed, well-nourished male in no acute distress; appearance consistent with age of record HENT: normocephalic; atraumatic; no tenderness on percussion of sinuses Eyes: Normal appearance Neck: supple Heart: regular rate and rhythm Lungs: clear to auscultation bilaterally Abdomen: soft; nondistended; nontender; bowel sounds present Extremities: No deformity; full range of motion; pulses normal; well-developed leg muscles with tenderness to palpation Neurologic: Awake, alert; motor function intact in all extremities and symmetric; no facial droop Skin: Warm and dry Psychiatric: Polite; cooperative   RESULTS  Summary of this visit's results, reviewed and interpreted by myself:   EKG Interpretation  Date/Time:    Ventricular Rate:  PR Interval:    QRS Duration:   QT Interval:    QTC Calculation:   R Axis:     Text Interpretation:         Laboratory Studies: Results for orders placed or performed during the hospital encounter of 01/31/21 (from the past 24 hour(s))  CBC with Differential     Status: Abnormal   Collection Time: 01/31/21 10:05 PM  Result Value Ref Range   WBC 6.6 4.0 - 10.5 K/uL   RBC 5.08 4.22 - 5.81 MIL/uL   Hemoglobin 13.5 13.0 - 17.0 g/dL   HCT 74.1 28.7 - 86.7 %   MCV 80.1 80.0 - 100.0 fL   MCH 26.6 26.0 - 34.0 pg   MCHC 33.2 30.0 - 36.0 g/dL   RDW  67.2 (H) 09.4 - 15.5 %   Platelets 278 150 - 400 K/uL   nRBC 0.0 0.0 - 0.2 %   Neutrophils Relative % 55 %   Neutro Abs 3.7 1.7 - 7.7 K/uL   Lymphocytes Relative 30 %   Lymphs Abs 2.0 0.7 - 4.0 K/uL   Monocytes Relative 12 %   Monocytes Absolute 0.8 0.1 - 1.0 K/uL   Eosinophils Relative 2 %   Eosinophils Absolute 0.1 0.0 - 0.5 K/uL   Basophils Relative 1 %   Basophils Absolute 0.0 0.0 - 0.1 K/uL   Immature Granulocytes 0 %   Abs Immature Granulocytes 0.01 0.00 - 0.07 K/uL  Comprehensive metabolic panel     Status: Abnormal   Collection Time: 01/31/21 10:05 PM  Result Value Ref Range   Sodium 133 (L) 135 - 145 mmol/L   Potassium 3.9 3.5 - 5.1 mmol/L   Chloride 101 98 - 111 mmol/L   CO2 25 22 - 32 mmol/L   Glucose, Bld 85 70 - 99 mg/dL   BUN 12 6 - 20 mg/dL   Creatinine, Ser 7.09 0.61 - 1.24 mg/dL   Calcium 8.8 (L) 8.9 - 10.3 mg/dL   Total Protein 8.6 (H) 6.5 - 8.1 g/dL   Albumin 3.7 3.5 - 5.0 g/dL   AST 29 15 - 41 U/L   ALT 22 0 - 44 U/L   Alkaline Phosphatase 74 38 - 126 U/L   Total Bilirubin 0.6 0.3 - 1.2 mg/dL   GFR, Estimated >62 >83 mL/min   Anion gap 7 5 - 15  CK     Status: Abnormal   Collection Time: 01/31/21 10:05 PM  Result Value Ref Range   Total CK 432 (H) 49 - 397 U/L   Imaging Studies: No results found.  ED COURSE and MDM  Nursing notes, initial and subsequent vitals signs, including pulse oximetry, reviewed and interpreted by myself.  Vitals:   01/31/21 2122 01/31/21 2123  BP: 119/84   Pulse: 93   Resp: 20   Temp: 98.2 F (36.8 C)   TempSrc: Oral   SpO2: 99%   Weight:  64 kg  Height:  5\' 9"  (1.753 m)   Medications  sodium chloride 0.9 % bolus 1,000 mL ( Intravenous Infusion Verify 01/31/21 2330)  ketorolac (TORADOL) 15 MG/ML injection 15 mg (15 mg Intravenous Given 01/31/21 2308)   11:39 PM Patient's patient's lab work is reassuring.  CK is not consistent with rhabdomyolysis.   PROCEDURES  Procedures   ED DIAGNOSES     ICD-10-CM   1.  Homeless  Z59.00     2. Hungry, initial encounter  T73.0XXA          Dray Dente,  Jonny Ruiz, MD 01/31/21 2340

## 2021-01-31 NOTE — ED Triage Notes (Signed)
To ER via EMS with c.o pain in both legs for a week. States he has not eaten in 2 days.

## 2021-02-22 ENCOUNTER — Ambulatory Visit: Payer: Medicaid Other

## 2021-03-02 ENCOUNTER — Emergency Department (HOSPITAL_COMMUNITY)
Admission: EM | Admit: 2021-03-02 | Discharge: 2021-03-03 | Disposition: A | Discharge status: Home / Self Care | Payer: Medicaid Other | Attending: Emergency Medicine | Admitting: Emergency Medicine

## 2021-03-02 ENCOUNTER — Encounter (HOSPITAL_COMMUNITY): Payer: Self-pay | Admitting: Emergency Medicine

## 2021-03-02 ENCOUNTER — Other Ambulatory Visit: Payer: Self-pay

## 2021-03-02 DIAGNOSIS — R519 Headache, unspecified: Secondary | ICD-10-CM | POA: Insufficient documentation

## 2021-03-02 DIAGNOSIS — Z21 Asymptomatic human immunodeficiency virus [HIV] infection status: Secondary | ICD-10-CM | POA: Insufficient documentation

## 2021-03-02 DIAGNOSIS — M79672 Pain in left foot: Secondary | ICD-10-CM | POA: Diagnosis not present

## 2021-03-02 DIAGNOSIS — F17210 Nicotine dependence, cigarettes, uncomplicated: Secondary | ICD-10-CM | POA: Insufficient documentation

## 2021-03-02 DIAGNOSIS — M79671 Pain in right foot: Secondary | ICD-10-CM | POA: Diagnosis not present

## 2021-03-02 DIAGNOSIS — Z5900 Homelessness unspecified: Secondary | ICD-10-CM

## 2021-03-02 DIAGNOSIS — G44209 Tension-type headache, unspecified, not intractable: Secondary | ICD-10-CM

## 2021-03-02 NOTE — ED Triage Notes (Signed)
Pt c/o bilateral feet swelling and headache.

## 2021-03-03 MED ORDER — IBUPROFEN 400 MG PO TABS
600.0000 mg | ORAL_TABLET | Freq: Once | ORAL | Status: AC
  Administered 2021-03-03: 600 mg via ORAL
  Filled 2021-03-03: qty 1

## 2021-03-03 NOTE — Discharge Instructions (Signed)
You are seen in the ER today for your headache.  Your physical exam and vital signs are very reassuring.  Suspect your headache is secondary to muscular tension in your shoulders and in your neck.  You have been given ibuprofen in the emergency department and something to eat.  Please return to the ER with any blurry or double vision, nausea or vomiting does not stop, or any other new severe symptoms.

## 2021-03-03 NOTE — ED Provider Notes (Signed)
[name] EMERGENCY DEPARTMENT Provider Note   CSN: [id number] Arrival date & time: 03/02/21  2344     History Chief Complaint  Patient presents with   Headache    Blake Chambers is a 30 y.o. male who is homeless who presents today for bilateral feet pain and headache.  Patient with frequent emergency department visits for very similar complaints.  At this time he states he has a headache and he is very tired and would like to be able to sleep.  Endorses some pain in his feet as well.  Headache started 2 days ago, is reportedly severe over "my whole head", aching type pain.  He has not tried anything to improve his pain and has not noticed anything that makes it worse.  To my exam he is sleeping and does not want to be woken up, however is easily arousable and will answer questions with much prompting.  Denies any blurry, double vision, nausea, vomiting, diarrhea, fevers or chills.  I personally reviewed this patient's medical records.  He has history of HIV, polysubstance abuse, and bipolar disorder.  He does not take any medications every day and he is currently homeless.  HPI     Past Medical History:  Diagnosis Date   Anxiety    Bipolar affective (HCC)    HIV (human immunodeficiency virus infection) (HCC)    Immune deficiency disorder (HCC)    Mental health disorder    Osteomyelitis of finger of left hand (HCC)    Syphilis     Patient Active Problem List   Diagnosis Date Noted   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   Pain of left forearm    Finger osteomyelitis, left (HCC) 09/11/2020   Family discord 08/13/2020   Perirectal cellulitis 11/01/2017   Abscess 08/20/2017   Perirectal abscess 08/20/2017   Encounter for long-term (current) use of high-risk medication 02/24/2017   Screening examination for venereal disease 10/21/2016   Cocaine use disorder, moderate, dependence (HCC) 10/23/2014   Cannabis use disorder, severe, dependence (HCC)  10/23/2014   Bipolar I disorder, severe, current or most recent episode depressed, with mixed features (HCC) 10/21/2014   Frostbite of both hands 08/01/2014   Tobacco use disorder 07/18/2013   Chronic renal insufficiency, 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 trying to quit  Vaping Use   Vaping Use: Never used  Substance Use Topics   Alcohol use: No    Alcohol/week: 1.0 standard drink    Types: 1 Standard drinks or equivalent per week   Drug use: Not Currently    Frequency: 1.0 times per week    Types: Marijuana, Cocaine, Methamphetamines    Home Medications Prior to Admission medications   Medication Sig Start Date End Date Taking? Authorizing Provider  ibuprofen (ADVIL) 600 MG tablet Take 1 tablet (600 mg total) by mouth every 6 (six)  hours as needed. 11/08/20   Jacalyn Lefevre, MD    Allergies    Tenofovir disoproxil  Review of Systems   Review of Systems  Constitutional: Negative.   HENT: Negative.    Eyes: Negative.  Negative for photophobia and visual disturbance.  Respiratory: Negative.    Cardiovascular: Negative.   Gastrointestinal: Negative.   Genitourinary: Negative.   Musculoskeletal:  Positive for myalgias.       Sore in bilateral feet  Skin: Negative.   Neurological:  Positive for headaches. Negative for dizziness and light-headedness.  Hematological: Negative.    Physical Exam Updated Vital Signs BP 117/83  (BP Location: Right Arm)   Pulse 63   Temp (!) 97.5 F (36.4 C)   Resp 15   SpO2 100%   Physical Exam Vitals and nursing note reviewed.  Constitutional:      Appearance: He is normal weight. He is not ill-appearing or toxic-appearing.  HENT:     Head: Normocephalic and atraumatic.     Nose: Nose normal.     Mouth/Throat:     Mouth: Mucous membranes are moist.     Pharynx: Oropharynx is clear. Uvula midline. No oropharyngeal exudate or posterior oropharyngeal erythema.     Tonsils: No tonsillar exudate.  Eyes:     General: Lids are normal. Vision grossly intact.        Right eye: No discharge.        Left eye: No discharge.     Extraocular Movements: Extraocular movements intact.     Conjunctiva/sclera: Conjunctivae normal.     Pupils: Pupils are equal, round, and reactive to light.  Neck:     Trachea: Trachea and phonation normal.     Meningeal: Brudzinski's sign and Kernig's sign absent.     Comments: Tenderness palpation over the left paraspinous cervical musculature and left SCM without warmth, erythema, induration, swelling, or crepitus.  Patient with full range of motion of the C-spine. Cardiovascular:     Rate and Rhythm: Normal rate and regular rhythm.     Pulses: Normal pulses.          Radial pulses are 2+ on the right side and 2+ on the left side.       Dorsalis pedis pulses are 2+ on the right side and 2+ on the left side.     Heart sounds: Normal heart sounds. No murmur heard. Pulmonary:     Effort: Pulmonary effort is normal. No tachypnea, bradypnea, accessory muscle usage, prolonged expiration or respiratory distress.     Breath sounds: Normal breath sounds. No wheezing or rales.  Chest:     Chest wall: No mass, lacerations, deformity, swelling, tenderness, crepitus or edema.  Abdominal:     General: Bowel sounds are normal. There is no distension.     Palpations: Abdomen is soft.     Tenderness: There is no abdominal tenderness. There is no right CVA  tenderness, left CVA tenderness, guarding or rebound.  Musculoskeletal:        General: No deformity.     Cervical back: Normal range of motion and neck supple. No edema, rigidity, torticollis or crepitus. Muscular tenderness present. No pain with movement or spinous process tenderness.     Right lower leg: No edema.     Left lower leg: No edema.  Lymphadenopathy:     Cervical: No cervical adenopathy.  Skin:    General: Skin is warm and dry.     Capillary Refill: Capillary refill takes less than 2  seconds.     Findings: No rash.  Neurological:     General: No focal deficit present.     Mental Status: He is alert, oriented to person, place, and time and easily aroused. Mental status is at baseline.     GCS: GCS eye subscore is 4. GCS verbal subscore is 5. GCS motor subscore is 6.     Gait: Gait is intact.  Psychiatric:        Mood and Affect: Mood normal.    ED Results / Procedures / Treatments   Labs (all labs ordered are listed, but only abnormal results are displayed) Labs Reviewed - No data to display  EKG None  Radiology No results found.  Procedures Procedures   Medications Ordered in ED Medications  ibuprofen (ADVIL) tablet 600 mg (has no administration in time range)    ED Course  I have reviewed the triage vital signs and the nursing notes.  Pertinent labs & imaging results that were available during my care of the patient were reviewed by me and considered in my medical decision making (see chart for details).    MDM Rules/Calculators/A&P                         30 year old male who presents with concern for headache and bilateral feet pain.  Emergent considerations for headache include subarachnoid hemorrhage, meningitis, temporal arteritis, glaucoma, cerebral ischemia, carotid/vertebral dissection, intracranial tumor, Venous sinus thrombosis, carbon monoxide poisoning, acute or chronic subdural hemorrhage.  Other considerations include: Migraine, Cluster  headache, Tension headache, Hypertension, Caffeine / alcohol / drug withdrawal, Pseudotumor cerebri, Arteriovenous malformation, Head injury, Neurocysticercosis, Post-lumbar puncture, Preeclampsia, Cervical arthritis, Refractive error causing strain, Dental abscess, Sinusitis, Otitis media, Temporomandibular joint syndrome, Depression, Somatoform disorder (eg, somatization) Trigeminal neuralgia, Glossopharyngeal neuralgia.   Vital signs are normal on intake.  Cardiopulmonary exam is normal, abdominal exam is benign.  Neuro exam without focal deficit.  Patient is ANO x3, able to follow commands, PERRL, EOMI.  Foot exam performed without obvious skin breakdown, 2+ pedal pulses bilaterally, patient is ambulatory in the emergency department.  Some tenderness palpation of the cervical paraspinous musculature in the left trapezius, however no midline tenderness palpation, no meningeal signs, full range of motion of the C-spine.  Patient is afebrile and denies any IV substance abuse.  He is immunocompromise, however denies any recent trauma and therefore is low risk for epidural abscess or hematoma.  Clinical picture and physical exam most consistent with tension type headache.  Will offer ibuprofen and something to eat.  No further work-up warranted in the [place] time given reassuring physical exam and vital signs.  Parents voiced understanding with medical evaluation and treatment plan.  Each of his questions answered to his expressed satisfaction.  Return precautions are given.  Patient is well-appearing, stable, appropriate for discharge at this time.  This chart was dictated using voice recognition software, Dragon. Despite the best efforts of this provider to proofread and correct errors, errors may still occur which can change documentation meaning.  Final Clinical Impression(s) / ED Diagnoses Final diagnoses:  None    Rx / DC Orders ED Discharge Orders     None        Sherrilee Gilles 03/03/21 1017    Milagros Loll, MD 03/11/21 [phone]

## 2021-03-07 ENCOUNTER — Telehealth: Payer: Self-pay

## 2021-03-07 NOTE — Telephone Encounter (Signed)
Patient is overdue for follow up. Attempted to call patient to schedule, no answer and voicemail greeting states the number belongs to "[employer]." No voicemail was left.   Sandie Ano, RN

## 2021-03-16 ENCOUNTER — Encounter (HOSPITAL_COMMUNITY): Payer: Self-pay | Admitting: Emergency Medicine

## 2021-03-16 ENCOUNTER — Emergency Department (HOSPITAL_COMMUNITY)
Admission: EM | Admit: 2021-03-16 | Discharge: 2021-03-16 | Disposition: A | Discharge status: Home / Self Care | Payer: Medicaid Other | Attending: Emergency Medicine | Admitting: Emergency Medicine

## 2021-03-16 DIAGNOSIS — X31XXXA Exposure to excessive natural cold, initial encounter: Secondary | ICD-10-CM | POA: Insufficient documentation

## 2021-03-16 DIAGNOSIS — Z21 Asymptomatic human immunodeficiency virus [HIV] infection status: Secondary | ICD-10-CM | POA: Insufficient documentation

## 2021-03-16 DIAGNOSIS — F17210 Nicotine dependence, cigarettes, uncomplicated: Secondary | ICD-10-CM | POA: Diagnosis not present

## 2021-03-16 DIAGNOSIS — X398XXA Other exposure to forces of nature, initial encounter: Secondary | ICD-10-CM

## 2021-03-16 DIAGNOSIS — T698XXA Other specified effects of reduced temperature, initial encounter: Secondary | ICD-10-CM | POA: Insufficient documentation

## 2021-03-16 DIAGNOSIS — N182 Chronic kidney disease, stage 2 (mild): Secondary | ICD-10-CM | POA: Diagnosis not present
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
## 2021-03-16 NOTE — Discharge Instructions (Signed)
Thank you for allowing me to care for you today in the Emergency Department.   Attached a list of resources within the area.   Return to the emergency department for new or worsening symptoms.
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
## 2021-03-19 ENCOUNTER — Other Ambulatory Visit: Payer: Self-pay

## 2021-03-19 ENCOUNTER — Encounter (HOSPITAL_COMMUNITY): Payer: Self-pay | Admitting: *Deleted

## 2021-03-19 ENCOUNTER — Emergency Department (HOSPITAL_COMMUNITY)
Admission: EM | Admit: 2021-03-19 | Discharge: 2021-03-19 | Disposition: A | Discharge status: Home / Self Care | Payer: Medicaid Other | Attending: Emergency Medicine | Admitting: Emergency Medicine

## 2021-03-19 DIAGNOSIS — Z5321 Procedure and treatment not carried out due to patient leaving prior to being seen by health care provider: Secondary | ICD-10-CM | POA: Insufficient documentation

## 2021-03-19 DIAGNOSIS — M79609 Pain in unspecified limb: Secondary | ICD-10-CM | POA: Insufficient documentation

## 2021-03-19 DIAGNOSIS — M25519 Pain in unspecified shoulder: Secondary | ICD-10-CM | POA: Insufficient documentation

## 2021-03-19 LAB — BASIC METABOLIC PANEL
Anion gap: 7 (ref 5–15)
BUN: 13 mg/dL (ref 6–20)
CO2: 25 mmol/L (ref 22–32)
Calcium: 9 mg/dL (ref 8.9–10.3)
Chloride: 103 mmol/L (ref 98–111)
Creatinine, Ser: 1.37 mg/dL — ABNORMAL HIGH (ref 0.61–1.24)
GFR, Estimated: >60 mL/min (ref >60)
Glucose, Bld: 167 mg/dL — ABNORMAL HIGH (ref 70–99)
Potassium: 3.2 mmol/L — ABNORMAL LOW (ref 3.5–5.1)
Sodium: 135 mmol/L (ref 135–145)

## 2021-03-19 LAB — CBC WITH DIFFERENTIAL/PLATELET
Abs Immature Granulocytes: 0.01 10*3/uL (ref 0.00–0.07)
Basophils Absolute: 0 10*3/uL (ref 0.0–0.1)
Basophils Relative: 1 %
Eosinophils Absolute: 0.1 10*3/uL (ref 0.0–0.5)
Eosinophils Relative: 1 %
HCT: 40.6 % (ref 39.0–52.0)
Hemoglobin: 13 g/dL (ref 13.0–17.0)
Immature Granulocytes: 0 %
Lymphocytes Relative: 27 %
Lymphs Abs: 1.5 10*3/uL (ref 0.7–4.0)
MCH: 25.4 pg — ABNORMAL LOW (ref 26.0–34.0)
MCHC: 32 g/dL (ref 30.0–36.0)
MCV: 79.5 fL — ABNORMAL LOW (ref 80.0–100.0)
Monocytes Absolute: 0.5 10*3/uL (ref 0.1–1.0)
Monocytes Relative: 8 %
Neutro Abs: 3.4 10*3/uL (ref 1.7–7.7)
Neutrophils Relative %: 63 %
Platelets: 370 10*3/uL (ref 150–400)
RBC: 5.11 MIL/uL (ref 4.22–5.81)
RDW: 15.9 % — ABNORMAL HIGH (ref 11.5–15.5)
WBC: 5.5 10*3/uL (ref 4.0–10.5)
nRBC: 0 % (ref 0.0–0.2)

## 2021-03-19 LAB — CK: Total CK: 400 U/L — ABNORMAL HIGH (ref 49–397)

## 2021-03-19 NOTE — ED Triage Notes (Signed)
Pt c/o leg and shoulder pain after walking "for 4 hours." Pt having difficulty staying awake. Denies drug use

## 2021-03-19 NOTE — ED Notes (Signed)
Called pt for vitals, no response.

## 2021-03-19 NOTE — ED Provider Notes (Signed)
Emergency Medicine Provider Triage Evaluation Note  Reuben Knoblock , a 30 y.o. male  was evaluated in triage.  Pt complains of leg pain and arm pain.  States that he has been walking for the past 4 hours.  States that he is could.  Has hx of homelessness.  Review of Systems  Positive: Arthralgias Negative: Fever, chills  Physical Exam  BP 112/67 (BP Location: Left Arm)   Pulse 81   Temp 98.5 F (36.9 C)   Resp 18   SpO2 100%  Gen:   Drowsy, no distress   Resp:  Normal effort  MSK:   Moves extremities without difficulty  Other:    Medical Decision Making  Medically screening exam initiated at 12:33 AM.  Appropriate orders placed.  Jehiel Koepp was informed that the remainder of the evaluation will be completed by another provider, this initial triage assessment does not replace that evaluation, and the importance of remaining in the ED until their evaluation is complete.  Arthralgias/myalgias  Homeless   Roxy Horseman, Cordelia Poche 03/19/21 0034    Palumbo, April, MD 03/19/21 2863

## 2021-03-19 NOTE — ED Notes (Signed)
This tech was informed by [facility] staff that patient left.  

## 2021-04-07 ENCOUNTER — Emergency Department (HOSPITAL_COMMUNITY)
Admission: EM | Admit: 2021-04-07 | Discharge: 2021-04-07 | Disposition: A | Discharge status: Home / Self Care | Payer: Medicaid Other | Attending: Emergency Medicine | Admitting: Emergency Medicine

## 2021-04-07 ENCOUNTER — Encounter (HOSPITAL_COMMUNITY): Payer: Self-pay | Admitting: Emergency Medicine

## 2021-04-07 ENCOUNTER — Other Ambulatory Visit: Payer: Self-pay

## 2021-04-07 DIAGNOSIS — G8929 Other chronic pain: Secondary | ICD-10-CM | POA: Diagnosis not present

## 2021-04-07 DIAGNOSIS — M79646 Pain in unspecified finger(s): Secondary | ICD-10-CM | POA: Diagnosis not present

## 2021-04-07 DIAGNOSIS — Z5321 Procedure and treatment not carried out due to patient leaving prior to being seen by health care provider: Secondary | ICD-10-CM | POA: Diagnosis not present

## 2021-04-07 NOTE — ED Triage Notes (Signed)
BIB EMS the [facility] on [address]. Complains of finger pain where he had frostbite 5 years ago, has chronic pain. Vital signs stable per EMS.

## 2021-04-07 NOTE — ED Notes (Signed)
Attempted to move pt in for room, not visualized in lobby.

## 2021-04-17 ENCOUNTER — Emergency Department (HOSPITAL_COMMUNITY)
Admission: EM | Admit: 2021-04-17 | Discharge: 2021-04-17 | Disposition: A | Discharge status: Home / Self Care | Payer: Medicaid Other | Attending: Emergency Medicine | Admitting: Emergency Medicine

## 2021-04-17 ENCOUNTER — Encounter (HOSPITAL_COMMUNITY): Payer: Self-pay | Admitting: Emergency Medicine

## 2021-04-17 DIAGNOSIS — Z21 Asymptomatic human immunodeficiency virus [HIV] infection status: Secondary | ICD-10-CM | POA: Diagnosis not present

## 2021-04-17 DIAGNOSIS — T698XXA Other specified effects of reduced temperature, initial encounter: Secondary | ICD-10-CM | POA: Diagnosis not present

## 2021-04-17 DIAGNOSIS — X31XXXA Exposure to excessive natural cold, initial encounter: Secondary | ICD-10-CM | POA: Diagnosis not present

## 2021-04-17 DIAGNOSIS — F17210 Nicotine dependence, cigarettes, uncomplicated: Secondary | ICD-10-CM | POA: Diagnosis not present

## 2021-04-17 DIAGNOSIS — Z5902 Unsheltered homelessness: Secondary | ICD-10-CM | POA: Diagnosis not present

## 2021-04-17 DIAGNOSIS — T699XXA Effect of reduced temperature, unspecified, initial encounter: Secondary | ICD-10-CM
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
## 2021-04-17 NOTE — Discharge Instructions (Addendum)
Return for any emergency condition

## 2021-04-17 NOTE — ED Notes (Signed)
Discharge instructions discussed with pt; opportunity for questions provided
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
## 2021-04-20 ENCOUNTER — Emergency Department (HOSPITAL_COMMUNITY)
Admission: EM | Admit: 2021-04-20 | Discharge: 2021-04-20 | Discharge status: Against Medical Advice | Payer: Medicaid Other

## 2021-05-02 ENCOUNTER — Emergency Department (HOSPITAL_COMMUNITY)
Admission: EM | Admit: 2021-05-02 | Discharge: 2021-05-03 | Disposition: A | Discharge status: Home / Self Care | Payer: Medicaid Other | Attending: Emergency Medicine | Admitting: Emergency Medicine

## 2021-05-02 ENCOUNTER — Encounter (HOSPITAL_COMMUNITY): Payer: Self-pay | Admitting: Emergency Medicine

## 2021-05-02 ENCOUNTER — Other Ambulatory Visit: Payer: Self-pay

## 2021-05-02 DIAGNOSIS — Z5900 Homelessness unspecified: Secondary | ICD-10-CM | POA: Diagnosis not present

## 2021-05-02 DIAGNOSIS — M79641 Pain in right hand: Secondary | ICD-10-CM | POA: Diagnosis not present

## 2021-05-02 DIAGNOSIS — Z5321 Procedure and treatment not carried out due to patient leaving prior to being seen by health care provider: Secondary | ICD-10-CM | POA: Diagnosis not present

## 2021-05-02 DIAGNOSIS — M79642 Pain in left hand: Secondary | ICD-10-CM | POA: Diagnosis not present

## 2021-05-02 NOTE — ED Provider Notes (Signed)
Emergency Medicine Provider Triage Evaluation Note  Blake Chambers , a 30 y.o. male  was evaluated in triage.  Pt complains of bilateral hand pain x4 hours.  He is homeless and reports being cold.  Review of Systems  Positive:  hand pain, cold exposure Negative:  fever  Physical Exam  BP 124/86 (BP Location: Left Arm)   Pulse 88   Temp 98.1 F (36.7 C)   Resp 16   SpO2 97%   Gen:   Awake, no distress   Resp:  Normal effort  MSK:   Moves extremities without difficulty  Other:  Hands are warm, normal perfusion, no open wounds/sores, no signs of frostbite  Medical Decision Making  Medically screening exam initiated at 11:08 PM.  Appropriate orders placed.  Blake Chambers was informed that the remainder of the evaluation will be completed by another provider, this initial triage assessment does not replace that evaluation, and the importance of remaining in the ED until their evaluation is complete.  Bilateral hand pain and cold exposure.  No signs of frostbite on exam.   Garlon Hatchet, PA-C 05/02/21 2334    [name], MD 05/03/21 [phone]

## 2021-05-02 NOTE — ED Triage Notes (Signed)
Patient arrived with EMS from street ( homeless) reports bilateral hand pain today due to cold weather exposure .

## 2021-05-03 NOTE — ED Notes (Signed)
Called pt to room no response

## 2021-05-15 ENCOUNTER — Encounter (HOSPITAL_COMMUNITY): Payer: Self-pay | Admitting: Emergency Medicine

## 2021-05-15 ENCOUNTER — Other Ambulatory Visit: Payer: Self-pay

## 2021-05-15 ENCOUNTER — Emergency Department (HOSPITAL_COMMUNITY)
Admission: EM | Admit: 2021-05-15 | Discharge: 2021-05-16 | Disposition: A | Discharge status: Home / Self Care | Payer: Medicaid Other | Attending: Emergency Medicine | Admitting: Emergency Medicine

## 2021-05-15 DIAGNOSIS — M79671 Pain in right foot: Secondary | ICD-10-CM | POA: Diagnosis not present

## 2021-05-15 DIAGNOSIS — F17210 Nicotine dependence, cigarettes, uncomplicated: Secondary | ICD-10-CM | POA: Diagnosis not present

## 2021-05-15 DIAGNOSIS — M79672 Pain in left foot: Secondary | ICD-10-CM | POA: Insufficient documentation

## 2021-05-15 DIAGNOSIS — M79641 Pain in right hand: Secondary | ICD-10-CM | POA: Diagnosis not present

## 2021-05-15 DIAGNOSIS — Z21 Asymptomatic human immunodeficiency virus [HIV] infection status: Secondary | ICD-10-CM | POA: Insufficient documentation

## 2021-05-15 DIAGNOSIS — M79642 Pain in left hand: Secondary | ICD-10-CM | POA: Diagnosis not present

## 2021-05-15 NOTE — ED Triage Notes (Signed)
Patient presents to the ED by EMS with c/o bilateral finger pain ongoing since 11/17. He reports pain is due to being in the cold as he is homeless right now. Peripheral Pulse +3 bilaterally.

## 2021-05-15 NOTE — ED Provider Notes (Signed)
Emergency Medicine Provider Triage Evaluation Note  Blake Chambers , a 30 y.o. male  was evaluated in triage.  Pt complains of hand pain.  Review of Systems  Positive: Hand pain Negative: fever  Physical Exam  BP (!) 133/93   Pulse 95   Temp 98.2 F (36.8 C) (Oral)   Resp 16   SpO2 98%  Gen:   Awake, no distress   Resp:  Normal effort  MSK:   Moves extremities without difficulty  Other:  Chronic changes to the bilat hands  Medical Decision Making  Medically screening exam initiated at 9:36 PM.  Appropriate orders placed.  Blake Chambers was informed that the remainder of the evaluation will be completed by another provider, this initial triage assessment does not replace that evaluation, and the importance of remaining in the ED until their evaluation is complete.     Blake Chambers 05/15/21 2137    Blake Nick, MD 05/16/21 [phone]

## 2021-05-16 NOTE — ED Provider Notes (Signed)
[facility] EMERGENCY DEPARTMENT Provider Note   CSN: [id number] Arrival date & time: 05/15/21  1939     History Chief Complaint  Patient presents with   Hand Pain    Blake Chambers is a 30 y.o. male.  The history is provided by the patient.  Hand Pain Blake Chambers is a 30 y.o. male who presents to the Emergency Department complaining of bilateral hand and foot pain. He presents the emergency department complaining of several years of pain in his hands and feet. His foot pain is worse with walking. No reports of fevers, chest pain, difficulty breathing. He does have a history of frostbite to bilateral second digits with chronic deformities to these regions. He is currently homeless and is having trouble getting placement in the [facility] for shelter. He also states that he is unable to afford his HIV medications     Past Medical History:  Diagnosis Date   Anxiety    Bipolar affective (HCC)    HIV (human immunodeficiency virus infection) (HCC)    Immune deficiency disorder (HCC)    Mental health disorder    Osteomyelitis of finger of left hand (HCC)    Syphilis     Patient Active Problem List   Diagnosis Date Noted   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   Pain of left forearm    Finger osteomyelitis, left (HCC) 09/11/2020   Family discord 08/13/2020   Perirectal cellulitis 11/01/2017   Abscess 08/20/2017   Perirectal abscess 08/20/2017   Encounter for long-term (current) use of high-risk medication 02/24/2017   Screening examination for venereal disease 10/21/2016   Cocaine use disorder, moderate, dependence (HCC) 10/23/2014   Cannabis use disorder, severe, dependence (HCC) 10/23/2014   Bipolar I disorder, severe, current or most recent episode depressed, with mixed features (HCC) 10/21/2014   Frostbite of both hands 08/01/2014   Tobacco use disorder 07/18/2013   Chronic renal insufficiency, stage II (mild) 11/30/2012   HIV disease (HCC)  02/13/2011   Syphilis 02/13/2011   Bipolar I disorder (HCC) 02/10/2011    Past Surgical History:  Procedure Laterality Date   IRRIGATION AND DEBRIDEMENT ABSCESS N/A 08/20/2017   Procedure: IRRIGATION AND DEBRIDEMENT PERI RECTAL ABSCESS;  Surgeon: Andria Meuse, MD;  Location: [facility] OR;  Service: General;  Laterality: N/A;       Family History  Family history unknown: Yes    Social History   Tobacco Use   Smoking status: Every Day    Packs/day: 0.30    Types: Cigarettes   Smokeless tobacco: Never   Tobacco comments:    pt. trying to quit  Vaping Use   Vaping Use: Never used  Substance Use Topics   Alcohol use: No    Alcohol/week: 1.0 standard drink    Types: 1 Standard drinks or equivalent per week   Drug use: Not Currently    Frequency: 1.0 times per week    Types: Marijuana, Cocaine, Methamphetamines    Home Medications Prior to Admission medications   Medication Sig Start Date End Date Taking? Authorizing Provider  ibuprofen (ADVIL) 600 MG tablet Take 1 tablet (600 mg total) by mouth every 6 (six) hours as needed. 11/08/20   Jacalyn Lefevre, MD    Allergies    Tenofovir disoproxil  Review of Systems   Review of Systems  All other systems reviewed and are negative.  Physical Exam Updated Vital Signs BP (!) 133/93   Pulse 95   Temp 98.2 F (36.8 C) (Oral)  Resp 16   SpO2 98%   Physical Exam Vitals and nursing note reviewed.  Constitutional:      Appearance: He is well-developed.  HENT:     Head: Normocephalic and atraumatic.  Cardiovascular:     Rate and Rhythm: Normal rate and regular rhythm.  Pulmonary:     Effort: Pulmonary effort is normal. No respiratory distress.  Abdominal:     Palpations: Abdomen is soft.     Tenderness: There is no abdominal tenderness. There is no guarding or rebound.  Musculoskeletal:     Comments: 2+ radial and DP pulses bilaterally. No significant erythema or edema to the feet bilaterally. No open wounds to the  feet. There are some calluses in this area. He has some mild erythema to the polymer surface of bilateral hands including the digits. There is minimal edema. He has chronic deformity to the left and right second digit just distal to the DIP with nail bed deformity that is chronic. He is able to flex and extend throughout all the digits. There is no discrete isolated joint tenderness to palpation. There are no open or weeping wounds.  Skin:    General: Skin is warm and dry.  Neurological:     Mental Status: He is alert and oriented to person, place, and time.  Psychiatric:        Behavior: Behavior normal.    ED Results / Procedures / Treatments   Labs (all labs ordered are listed, but only abnormal results are displayed) Labs Reviewed - No data to display  EKG None  Radiology No results found.  Procedures Procedures   Medications Ordered in ED Medications - No data to display  ED Course  I have reviewed the triage vital signs and the nursing notes.  Pertinent labs & imaging results that were available during my care of the patient were reviewed by me and considered in my medical decision making (see chart for details).    MDM Rules/Calculators/A&P                          patient with history of HIV, unable to currently afford his medications here for evaluation of bilateral hand and foot pain. He does have a history of frostbite in the past. He currently has no evidence of acute infectious process. He is well perfused on evaluation. No evidence of acute cold injury. Discussed with patient concern for recurrent cold injury. Case management consulted regarding resources to afford his medications as well as shelter availability and cold conditions to prevent recurrent injury.  Final Clinical Impression(s) / ED Diagnoses Final diagnoses:  Bilateral hand pain    Rx / DC Orders ED Discharge Orders     None        Quintella Reichert, MD 05/16/21 0102

## 2021-05-21 ENCOUNTER — Other Ambulatory Visit: Payer: Self-pay

## 2021-05-21 ENCOUNTER — Emergency Department (HOSPITAL_COMMUNITY)
Admission: EM | Admit: 2021-05-21 | Discharge: 2021-05-21 | Disposition: A | Discharge status: Home / Self Care | Payer: Medicaid Other | Attending: Emergency Medicine | Admitting: Emergency Medicine

## 2021-05-21 ENCOUNTER — Encounter (HOSPITAL_COMMUNITY): Payer: Self-pay

## 2021-05-21 DIAGNOSIS — T699XXA Effect of reduced temperature, unspecified, initial encounter: Secondary | ICD-10-CM | POA: Insufficient documentation

## 2021-05-21 DIAGNOSIS — R6889 Other general symptoms and signs: Secondary | ICD-10-CM

## 2021-05-21 DIAGNOSIS — Z21 Asymptomatic human immunodeficiency virus [HIV] infection status: Secondary | ICD-10-CM | POA: Insufficient documentation

## 2021-05-21 DIAGNOSIS — X31XXXA Exposure to excessive natural cold, initial encounter: Secondary | ICD-10-CM | POA: Insufficient documentation

## 2021-05-21 DIAGNOSIS — F17210 Nicotine dependence, cigarettes, uncomplicated: Secondary | ICD-10-CM | POA: Diagnosis not present

## 2021-05-21 DIAGNOSIS — Z5900 Homelessness unspecified: Secondary | ICD-10-CM | POA: Insufficient documentation

## 2021-05-21 NOTE — ED Triage Notes (Addendum)
Pt BIB EMS, pt has been outside x 5 hrs and was shivering. Pt is homeless.

## 2021-05-21 NOTE — ED Provider Notes (Signed)
[facility]-EMERGENCY DEPT Provider Note   CSN: [id number] Arrival date & time: 05/21/21  0404     History Chief Complaint  Patient presents with   Cold Exposure    Blake Chambers is a 30 y.o. male presenting for evaluation of being cold.  Patient states he is homeless.  He was cold and shaking last night.  He has been in the ER for 4 hours, feels better now.  He is no longer feeling as cold.  He reports no other complaints including cough, chest pain, shortness of breath, nausea, vomiting, diarrhea.  He would like resources about shelters.  Additional history attained from chart review.  Patient is seen frequently in the ER for similar/homeless related complaints. H/o anxiety, bipolar, HIV  HPI     Past Medical History:  Diagnosis Date   Anxiety    Bipolar affective (HCC)    HIV (human immunodeficiency virus infection) (HCC)    Immune deficiency disorder (HCC)    Mental health disorder    Osteomyelitis of finger of left hand (HCC)    Syphilis     Patient Active Problem List   Diagnosis Date Noted   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   Pain of left forearm    Finger osteomyelitis, left (HCC) 09/11/2020   Family discord 08/13/2020   Perirectal cellulitis 11/01/2017   Abscess 08/20/2017   Perirectal abscess 08/20/2017   Encounter for long-term (current) use of high-risk medication 02/24/2017   Screening examination for venereal disease 10/21/2016   Cocaine use disorder, moderate, dependence (HCC) 10/23/2014   Cannabis use disorder, severe, dependence (HCC) 10/23/2014   Bipolar I disorder, severe, current or most recent episode depressed, with mixed features (HCC) 10/21/2014   Frostbite of both hands 08/01/2014   Tobacco use disorder 07/18/2013   Chronic renal insufficiency, stage II (mild) 11/30/2012   HIV disease (HCC) 02/13/2011   Syphilis 02/13/2011   Bipolar I disorder (HCC) 02/10/2011    Past Surgical History:  Procedure  Laterality Date   IRRIGATION AND DEBRIDEMENT ABSCESS N/A 08/20/2017   Procedure: IRRIGATION AND DEBRIDEMENT PERI RECTAL ABSCESS;  Surgeon: Andria Meuse, MD;  Location: [facility] OR;  Service: General;  Laterality: N/A;       Family History  Family history unknown: Yes    Social History   Tobacco Use   Smoking status: Every Day    Packs/day: 0.30    Types: Cigarettes   Smokeless tobacco: Never   Tobacco comments:    pt. trying to quit  Vaping Use   Vaping Use: Never used  Substance Use Topics   Alcohol use: No    Alcohol/week: 1.0 standard drink    Types: 1 Standard drinks or equivalent per week   Drug use: Not Currently    Frequency: 1.0 times per week    Types: Marijuana, Cocaine, Methamphetamines    Home Medications Prior to Admission medications   Medication Sig Start Date End Date Taking? Authorizing Provider  ibuprofen (ADVIL) 600 MG tablet Take 1 tablet (600 mg total) by mouth every 6 (six) hours as needed. 11/08/20   Jacalyn Lefevre, MD    Allergies    Tenofovir disoproxil  Review of Systems   Review of Systems  Musculoskeletal:        Shaking because he is cold  All other systems reviewed and are negative.  Physical Exam Updated Vital Signs BP 117/85 (BP Location: Right Arm)   Pulse 73   Temp 97.8 F (36.6 C) (Oral)  Resp 16   SpO2 100%   Physical Exam Vitals and nursing note reviewed.  Constitutional:      General: He is not in acute distress.    Appearance: He is well-developed.     Comments: Resting in the bed in NAD  HENT:     Head: Normocephalic and atraumatic.  Eyes:     Extraocular Movements: Extraocular movements intact.  Cardiovascular:     Rate and Rhythm: Normal rate and regular rhythm.     Pulses: Normal pulses.  Pulmonary:     Effort: Pulmonary effort is normal.     Breath sounds: Normal breath sounds.     Comments: Clear lung sounds Abdominal:     General: There is no distension.     Palpations: Abdomen is soft. There is  no mass.     Tenderness: There is no abdominal tenderness. There is no guarding or rebound.  Musculoskeletal:        General: Normal range of motion.     Cervical back: Normal range of motion.     Comments: No tremors or shakes noted  Skin:    General: Skin is warm.     Findings: No rash.  Neurological:     Mental Status: He is alert and oriented to person, place, and time.    ED Results / Procedures / Treatments   Labs (all labs ordered are listed, but only abnormal results are displayed) Labs Reviewed - No data to display  EKG None  Radiology No results found.  Procedures Procedures   Medications Ordered in ED Medications - No data to display  ED Course  I have reviewed the triage vital signs and the nursing notes.  Pertinent labs & imaging results that were available during my care of the patient were reviewed by me and considered in my medical decision making (see chart for details).    MDM Rules/Calculators/A&P                           Patient presenting due to shaking due to feeling cold.  He is homeless.  Reports no other complaints.  Exam is overall reassuring.  He is afebrile, and without other acute complaints, doubt infectious cause.  Likely he is cold due to being homeless and temperature outside.  As he has been in the ER for 4 hours, his symptoms have resolved and he is no longer as cold.  As such, will plan for discharge.  Resources given for shelters. At this time, pt appears safe for d/c. Return precautions given. Pt states he understands and agrees to plan.   Final Clinical Impression(s) / ED Diagnoses Final diagnoses:  Cold feeling  Homeless    Rx / DC Orders ED Discharge Orders     None        Franchot Heidelberg, PA-C 05/21/21 [place], [place], DO 05/21/21 [phone]

## 2021-05-29 ENCOUNTER — Encounter (HOSPITAL_COMMUNITY): Payer: Self-pay

## 2021-05-29 ENCOUNTER — Emergency Department (HOSPITAL_COMMUNITY): Payer: Medicaid Other

## 2021-05-29 ENCOUNTER — Emergency Department (HOSPITAL_COMMUNITY)
Admission: EM | Admit: 2021-05-29 | Discharge: 2021-05-30 | Disposition: A | Discharge status: Home / Self Care | Payer: Medicaid Other | Attending: Emergency Medicine | Admitting: Emergency Medicine

## 2021-05-29 DIAGNOSIS — N182 Chronic kidney disease, stage 2 (mild): Secondary | ICD-10-CM | POA: Insufficient documentation

## 2021-05-29 DIAGNOSIS — S99911A Unspecified injury of right ankle, initial encounter: Secondary | ICD-10-CM | POA: Insufficient documentation

## 2021-05-29 DIAGNOSIS — Z21 Asymptomatic human immunodeficiency virus [HIV] infection status: Secondary | ICD-10-CM | POA: Insufficient documentation

## 2021-05-29 DIAGNOSIS — W010XXA Fall on same level from slipping, tripping and stumbling without subsequent striking against object, initial encounter: Secondary | ICD-10-CM | POA: Diagnosis not present

## 2021-05-29 DIAGNOSIS — F17210 Nicotine dependence, cigarettes, uncomplicated: Secondary | ICD-10-CM | POA: Insufficient documentation

## 2021-05-29 DIAGNOSIS — M79671 Pain in right foot: Secondary | ICD-10-CM | POA: Insufficient documentation

## 2021-05-29 NOTE — ED Triage Notes (Signed)
Patient BIB EMS with report of right ankle pain. Pt states he slipped at ground level and injured his ankle. EMS reports no obvious deformity, pt reporting pain 10/10.  EMS vitals: BP 138/70 HR 70 CBG 78

## 2021-05-30 ENCOUNTER — Emergency Department (HOSPITAL_COMMUNITY)
Admission: EM | Admit: 2021-05-30 | Discharge: 2021-05-30 | Disposition: A | Discharge status: Home / Self Care | Payer: Medicaid Other

## 2021-05-30 ENCOUNTER — Emergency Department (HOSPITAL_COMMUNITY): Payer: Medicaid Other

## 2021-05-30 MED ORDER — ACETAMINOPHEN 325 MG PO TABS
650.0000 mg | ORAL_TABLET | Freq: Once | ORAL | Status: AC
  Administered 2021-05-30: 650 mg via ORAL
  Filled 2021-05-30: qty 2

## 2021-05-30 NOTE — Discharge Instructions (Signed)
Please read and follow all provided instructions.  You have been seen today for ankle/foot pain after injury.   Tests performed today include: An x-ray of the affected area - does NOT show any broken bones or dislocations.  Vital signs. See below for your results today.   Home care instructions: -- *PRICE in the first 24-48 hours after injury: Protect (with brace, splint, sling), if given by your provider Rest Ice- Do not apply ice pack directly to your skin, place towel or similar between your skin and ice/ice pack. Apply ice for 20 min, then remove for 40 min while awake Compression- Wear brace, elastic bandage, splint as directed by your provider Elevate affected extremity above the level of your heart when not walking around for the first 24-48 hours   Medications:  Take tylenol per over the counter dosing to help with pain.    Follow-up instructions: Please follow-up with your primary care provider or the provided orthopedic physician (bone specialist) if you continue to have significant pain in 1 week. In this case you may have a more severe injury that requires further care.   Return instructions:  Please return if your digits or extremity are numb or tingling, appear gray or blue, or you have severe pain (also elevate the extremity and loosen splint or wrap if you were given one) Please return if you have redness or fevers.  Please return to the Emergency Department if you experience worsening symptoms.  Please return if you have any other emergent concerns. Additional Information:  Your vital signs today were: BP 124/77    Pulse 97    Temp 97.6 F (36.4 C) (Oral)    Resp 17    Ht 5\' 9"  (1.753 m)    Wt 68.9 kg    SpO2 100%    BMI 22.45 kg/m  If your blood pressure (BP) was elevated above 135/85 this visit, please have this repeated by your doctor within one month. ---------------

## 2021-05-30 NOTE — ED Triage Notes (Signed)
Pt bib ems after leaving [facility] from [facility] today. Pt with bil foot pain. VSS with ems.

## 2021-05-30 NOTE — ED Provider Notes (Signed)
[facility] DEPT Provider Note   CSN: [id number] Arrival date & time: 05/29/21  2313     History Chief Complaint  Patient presents with   Ankle Pain    Blake Chambers is a 30 y.o. male with a history of HIV, bipolar disorder, and polysubstance use who presents to the emergency department via EMS with complaints of right foot and ankle pain status post injury.  Patient states he rolled the right ankle resulting in pain to the ankle and the foot including the toes.  Pain is worse with movement.  No alleviating factors.  Denies head injury or loss of consciousness.  Denies new numbness, tingling, or weakness.  Denies any other areas of injury.  HPI     Past Medical History:  Diagnosis Date   Anxiety    Bipolar affective ([place])    HIV (human immunodeficiency virus infection) ([place])    Immune deficiency disorder ([place])    Mental health disorder    Osteomyelitis of finger of left hand ([place])    Syphilis     Patient Active Problem List   Diagnosis Date Noted   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   Pain of left forearm    Finger osteomyelitis, left ([place]) 09/11/2020   Family discord 08/13/2020   Perirectal cellulitis 11/01/2017   Abscess 08/20/2017   Perirectal abscess 08/20/2017   Encounter for long-term (current) use of high-risk medication 02/24/2017   Screening examination for venereal disease 10/21/2016   Cocaine use disorder, moderate, dependence ([place]) 10/23/2014   Cannabis use disorder, severe, dependence ([place]) 10/23/2014   Bipolar I disorder, severe, current or most recent episode depressed, with mixed features ([place]) 10/21/2014   Frostbite of both hands 08/01/2014   Tobacco use disorder 07/18/2013   Chronic renal insufficiency, stage II (mild) 11/30/2012   HIV disease ([place]) 02/13/2011   Syphilis 02/13/2011   Bipolar I disorder ([place]) 02/10/2011    Past Surgical History:  Procedure Laterality Date   IRRIGATION AND DEBRIDEMENT  ABSCESS N/A 08/20/2017   Procedure: IRRIGATION AND DEBRIDEMENT PERI RECTAL ABSCESS;  Surgeon: Ileana Roup, MD;  Location: [facility] OR;  Service: General;  Laterality: N/A;       Family History  Family history unknown: Yes    Social History   Tobacco Use   Smoking status: Every Day    Packs/day: 0.30    Types: Cigarettes   Smokeless tobacco: Never   Tobacco comments:    pt. trying to quit  Vaping Use   Vaping Use: Never used  Substance Use Topics   Alcohol use: No    Alcohol/week: 1.0 standard drink    Types: 1 Standard drinks or equivalent per week   Drug use: Not Currently    Frequency: 1.0 times per week    Types: Marijuana, Cocaine, Methamphetamines    Home Medications Prior to Admission medications   Medication Sig Start Date End Date Taking? Authorizing Provider  ibuprofen (ADVIL) 600 MG tablet Take 1 tablet (600 mg total) by mouth every 6 (six) hours as needed. 11/08/20   Isla Pence, MD    Allergies    Tenofovir disoproxil  Review of Systems   Review of Systems  Constitutional:  Negative for chills and fever.  Respiratory:  Negative for shortness of breath.   Cardiovascular:  Negative for chest pain.  Gastrointestinal:  Negative for abdominal pain.  Musculoskeletal:  Positive for arthralgias. Negative for back pain and neck pain.  Neurological:  Negative for weakness, numbness and headaches.  All other systems reviewed and are negative.  Physical Exam Updated Vital Signs BP 124/77    Pulse 97    Temp 97.6 F (36.4 C) (Oral)    Resp 17    Ht 5\' 9"  (1.753 m)    Wt 68.9 kg    SpO2 100%    BMI 22.45 kg/m   Physical Exam Vitals and nursing note reviewed.  Constitutional:      General: He is not in acute distress.    Appearance: He is well-developed. He is not ill-appearing or toxic-appearing.  HENT:     Head: Normocephalic and atraumatic.  Eyes:     General:        Right eye: No discharge.        Left eye: No discharge.     Conjunctiva/sclera:  Conjunctivae normal.  Cardiovascular:     Rate and Rhythm: Normal rate and regular rhythm.     Pulses:          Dorsalis pedis pulses are 2+ on the right side and 2+ on the left side.       Posterior tibial pulses are 2+ on the right side and 2+ on the left side.  Pulmonary:     Effort: Pulmonary effort is normal. No respiratory distress.     Breath sounds: Normal breath sounds. No wheezing, rhonchi or rales.  Chest:     Chest wall: No tenderness.  Abdominal:     General: There is no distension.     Palpations: Abdomen is soft.     Tenderness: There is no abdominal tenderness. There is no guarding or rebound.  Musculoskeletal:     Cervical back: Neck supple. No tenderness.     Comments: Upper extremities: Chronic deformity to the left fifth finger.  No focal bony tenderness Back: No midline tenderness  lower extremities: No obvious deformity, appreciable swelling, edema, erythema, ecchymosis, warmth, or open wounds.  Patient does have notably moist somewhat macerated skin to the foot. Patient has intact AROM to bilateral hips, knees, ankles, and all digits. Tender to palpation to the diffuse right ankle, midfoot, forefoot, and all digits.  Otherwise nontender.  Skin:    General: Skin is warm and dry.     Capillary Refill: Capillary refill takes less than 2 seconds.     Findings: No rash.  Neurological:     Mental Status: He is alert.     Comments: Alert. Clear speech. Sensation grossly intact to bilateral lower extremities. 5/5 strength with plantar/dorsiflexion bilaterally.   Psychiatric:        Mood and Affect: Mood normal.        Behavior: Behavior normal.    ED Results / Procedures / Treatments   Labs (all labs ordered are listed, but only abnormal results are displayed) Labs Reviewed - No data to display  EKG None  Radiology DG Ankle Complete Right  Result Date: 05/29/2021 CLINICAL DATA:  Slipped, right ankle pain EXAM: RIGHT ANKLE - COMPLETE 3+ VIEW COMPARISON:   06/10/2014 FINDINGS: Frontal, oblique, and lateral views of the right ankle are obtained. No fracture, subluxation, or dislocation. Joint spaces are well preserved. Soft tissues are normal. IMPRESSION: 1. Unremarkable right ankle. Electronically Signed   By: Randa Ngo M.D.   On: 05/29/2021 23:44   DG Foot Complete Right  Result Date: 05/30/2021 CLINICAL DATA:  Fall injury, right foot pain. EXAM: RIGHT FOOT COMPLETE - 3+ VIEW COMPARISON:  Study of 06/10/2014. FINDINGS: There is no evidence of fracture or  dislocation. There is no evidence of arthropathy or other focal bone abnormality. Soft tissues are unremarkable. Mild hallux valgus is again noted. IMPRESSION: No evidence of fracture, dislocation or degenerative changes. Mild hallux valgus. Electronically Signed   By: Almira Bar M.D.   On: 05/30/2021 00:25    Procedures Procedures   Medications Ordered in ED Medications - No data to display  ED Course  I have reviewed the triage vital signs and the nursing notes.  Pertinent labs & imaging results that were available during my care of the patient were reviewed by me and considered in my medical decision making (see chart for details).    MDM Rules/Calculators/A&P                           Patient presents to the ED with complaints of right foot and ankle pain after injury.  He is nontoxic, resting comfortably, vitals are within normal limits.  No other areas of complaints of pain/injury.  Additional history obtained:  Additional history obtained from chart review & nursing note review.   Imaging Studies ordered:  I ordered imaging studies which included right foot and ankle x-rays, I independently reviewed, formal radiology impression shows:  Right foot:No evidence of fracture, dislocation or degenerative changes. Mild hallux valgus Right ankle:1. Unremarkable right ankle  ED Course:  X-rays without fracture or dislocation.  Patient is neurovascularly intact distally.  His  foot is quite damp with some moist/macerated skin, feet were dried he was provided with dry socks.  No signs of acute infection at this time.  Provided with ASO and Tylenol.  Overall appears appropriate for discharge at this time. I discussed results, treatment plan, need for follow-up, and return precautions with the patient. Provided opportunity for questions, patient confirmed understanding and is in agreement with plan.   Portions of this note were generated with Scientist, clinical (histocompatibility and immunogenetics). Dictation errors may occur despite best attempts at proofreading.  Final Clinical Impression(s) / ED Diagnoses Final diagnoses:  Injury of right ankle, initial encounter    Rx / DC Orders ED Discharge Orders     None        Cherly Anderson, PA-C 05/30/21 0205    Geoffery Okuda, MD 05/30/21 [phone]

## 2021-05-31 ENCOUNTER — Emergency Department (HOSPITAL_COMMUNITY)
Admission: EM | Admit: 2021-05-31 | Discharge: 2021-05-31 | Disposition: A | Discharge status: Home / Self Care | Payer: Medicaid Other | Attending: Emergency Medicine | Admitting: Emergency Medicine

## 2021-05-31 ENCOUNTER — Encounter (HOSPITAL_COMMUNITY): Payer: Self-pay

## 2021-05-31 DIAGNOSIS — M79671 Pain in right foot: Secondary | ICD-10-CM | POA: Insufficient documentation

## 2021-05-31 DIAGNOSIS — F17210 Nicotine dependence, cigarettes, uncomplicated: Secondary | ICD-10-CM | POA: Insufficient documentation

## 2021-05-31 DIAGNOSIS — T730XXA Starvation, initial encounter: Secondary | ICD-10-CM | POA: Insufficient documentation

## 2021-05-31 DIAGNOSIS — R1084 Generalized abdominal pain: Secondary | ICD-10-CM | POA: Insufficient documentation

## 2021-05-31 DIAGNOSIS — R7401 Elevation of levels of liver transaminase levels: Secondary | ICD-10-CM | POA: Diagnosis not present

## 2021-05-31 DIAGNOSIS — R7989 Other specified abnormal findings of blood chemistry: Secondary | ICD-10-CM

## 2021-05-31 DIAGNOSIS — M79672 Pain in left foot: Secondary | ICD-10-CM | POA: Diagnosis not present

## 2021-05-31 DIAGNOSIS — N182 Chronic kidney disease, stage 2 (mild): Secondary | ICD-10-CM | POA: Diagnosis not present

## 2021-05-31 DIAGNOSIS — Z21 Asymptomatic human immunodeficiency virus [HIV] infection status: Secondary | ICD-10-CM | POA: Diagnosis not present

## 2021-05-31 DIAGNOSIS — R109 Unspecified abdominal pain: Secondary | ICD-10-CM | POA: Diagnosis present

## 2021-05-31 LAB — CBC WITH DIFFERENTIAL/PLATELET
Abs Immature Granulocytes: 0.02 10*3/uL (ref 0.00–0.07)
Basophils Absolute: 0 10*3/uL (ref 0.0–0.1)
Basophils Relative: 0 %
Eosinophils Absolute: 0 10*3/uL (ref 0.0–0.5)
Eosinophils Relative: 1 %
HCT: 43.1 % (ref 39.0–52.0)
Hemoglobin: 14.1 g/dL (ref 13.0–17.0)
Immature Granulocytes: 0 %
Lymphocytes Relative: 30 %
Lymphs Abs: 1.9 10*3/uL (ref 0.7–4.0)
MCH: 26.3 pg (ref 26.0–34.0)
MCHC: 32.7 g/dL (ref 30.0–36.0)
MCV: 80.3 fL (ref 80.0–100.0)
Monocytes Absolute: 0.7 10*3/uL (ref 0.1–1.0)
Monocytes Relative: 11 %
Neutro Abs: 3.7 10*3/uL (ref 1.7–7.7)
Neutrophils Relative %: 58 %
Platelets: 293 10*3/uL (ref 150–400)
RBC: 5.37 MIL/uL (ref 4.22–5.81)
RDW: 16.2 % — ABNORMAL HIGH (ref 11.5–15.5)
WBC: 6.3 10*3/uL (ref 4.0–10.5)
nRBC: 0 % (ref 0.0–0.2)

## 2021-05-31 LAB — COMPREHENSIVE METABOLIC PANEL
ALT: 81 U/L — ABNORMAL HIGH (ref 0–44)
AST: 71 U/L — ABNORMAL HIGH (ref 15–41)
Albumin: 3.3 g/dL — ABNORMAL LOW (ref 3.5–5.0)
Alkaline Phosphatase: 57 U/L (ref 38–126)
Anion gap: 7 (ref 5–15)
BUN: 12 mg/dL (ref 6–20)
CO2: 24 mmol/L (ref 22–32)
Calcium: 8.7 mg/dL — ABNORMAL LOW (ref 8.9–10.3)
Chloride: 102 mmol/L (ref 98–111)
Creatinine, Ser: 1.05 mg/dL (ref 0.61–1.24)
GFR, Estimated: >60 mL/min (ref >60)
Glucose, Bld: 152 mg/dL — ABNORMAL HIGH (ref 70–99)
Potassium: 3.6 mmol/L (ref 3.5–5.1)
Sodium: 133 mmol/L — ABNORMAL LOW (ref 135–145)
Total Bilirubin: 1.2 mg/dL (ref 0.3–1.2)
Total Protein: 7.7 g/dL (ref 6.5–8.1)

## 2021-05-31 LAB — LIPASE, BLOOD: Lipase: 29 U/L (ref 11–51)

## 2021-05-31 MED ORDER — ACETAMINOPHEN 325 MG PO TABS
650.0000 mg | ORAL_TABLET | Freq: Once | ORAL | Status: AC
  Administered 2021-05-31: 650 mg via ORAL
  Filled 2021-05-31: qty 2

## 2021-05-31 NOTE — ED Triage Notes (Addendum)
[place]. Patient complains of abdominal pain that started 4 days ago. Patient also complains of back and foot pain. Patient has not eaten in 4 days.

## 2021-05-31 NOTE — ED Notes (Signed)
RN unsuccessful x1 with straight stick. RN asked phlebotomy to stick pt.

## 2021-05-31 NOTE — Discharge Instructions (Signed)
It is extremely important that you call the infectious disease clinic listed below to set up a follow-up appointment for further management of your HIV and recheck of your liver enzymes. Return to the emergency room with any new, worsening, concerning symptoms.

## 2021-05-31 NOTE — ED Provider Notes (Signed)
[facility] EMERGENCY DEPARTMENT Provider Note   CSN: [id number] Arrival date & time: 05/31/21  [id number]     History Chief Complaint  Patient presents with   Abdominal Pain    Blake Chambers is a 30 y.o. male presenting for evaluation of abd pain, bilateral foot pain, and hunger.   Pt states he is having pain of bilateral feet, on the bottom. This got worse last night. He has a h/o the same, states it feels similar. No trauma or injury. Touching them and walking makes it worse, nothing makes it better.  Additionally, pt reports several days of abd pain. He states this is because he is hungry-- has not eaten in 4 days (due to being homeless). He denies fevers, n/v/d. No cough, cp, sob.   Additional history obtained from chart review. H/o bipolar, homelessness, HIV.  HPI     Past Medical History:  Diagnosis Date   Anxiety    Bipolar affective ([place])    HIV (human immunodeficiency virus infection) ([place])    Immune deficiency disorder ([place])    Mental health disorder    Osteomyelitis of finger of left hand ([place])    Syphilis     Patient Active Problem List   Diagnosis Date Noted   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   Pain of left forearm    Finger osteomyelitis, left ([place]) 09/11/2020   Family discord 08/13/2020   Perirectal cellulitis 11/01/2017   Abscess 08/20/2017   Perirectal abscess 08/20/2017   Encounter for long-term (current) use of high-risk medication 02/24/2017   Screening examination for venereal disease 10/21/2016   Cocaine use disorder, moderate, dependence ([place]) 10/23/2014   Cannabis use disorder, severe, dependence ([place]) 10/23/2014   Bipolar I disorder, severe, current or most recent episode depressed, with mixed features ([place]) 10/21/2014   Frostbite of both hands 08/01/2014   Tobacco use disorder 07/18/2013   Chronic renal insufficiency, stage II (mild) 11/30/2012   HIV disease ([place]) 02/13/2011   Syphilis 02/13/2011   Bipolar  I disorder ([place]) 02/10/2011    Past Surgical History:  Procedure Laterality Date   IRRIGATION AND DEBRIDEMENT ABSCESS N/A 08/20/2017   Procedure: IRRIGATION AND DEBRIDEMENT PERI RECTAL ABSCESS;  Surgeon: Ileana Roup, MD;  Location: [facility] OR;  Service: General;  Laterality: N/A;       Family History  Family history unknown: Yes    Social History   Tobacco Use   Smoking status: Some Days    Packs/day: 0.50    Types: Cigarettes   Smokeless tobacco: Never   Tobacco comments:    pt. trying to quit  Vaping Use   Vaping Use: Never used  Substance Use Topics   Alcohol use: Not Currently    Alcohol/week: 1.0 standard drink    Types: 1 Standard drinks or equivalent per week   Drug use: Not Currently    Frequency: 1.0 times per week    Types: Marijuana, Cocaine, Methamphetamines    Home Medications Prior to Admission medications   Medication Sig Start Date End Date Taking? Authorizing Provider  ibuprofen (ADVIL) 600 MG tablet Take 1 tablet (600 mg total) by mouth every 6 (six) hours as needed. 11/08/20   Isla Pence, MD    Allergies    Tenofovir disoproxil  Review of Systems   Review of Systems  Gastrointestinal:  Positive for abdominal pain.  Musculoskeletal:  Positive for arthralgias.  All other systems reviewed and are negative.  Physical Exam Updated Vital Signs BP 121/70 (BP  Location: Right Arm)    Pulse 85    Temp 97.9 F (36.6 C) (Oral)    Resp 16    Ht 5\' 9"  (1.753 m)    Wt 68 kg    SpO2 100%    BMI 22.15 kg/m   Physical Exam Vitals and nursing note reviewed.  Constitutional:      General: He is not in acute distress.    Appearance: Normal appearance.  HENT:     Head: Normocephalic and atraumatic.  Eyes:     Conjunctiva/sclera: Conjunctivae normal.     Pupils: Pupils are equal, round, and reactive to light.  Cardiovascular:     Rate and Rhythm: Normal rate and regular rhythm.     Pulses: Normal pulses.  Pulmonary:     Effort: Pulmonary effort  is normal. No respiratory distress.     Breath sounds: Normal breath sounds. No wheezing.     Comments: Speaking in full sentences.  Clear lung sounds in all fields. Abdominal:     General: There is no distension.     Palpations: Abdomen is soft. There is no mass.     Tenderness: There is no abdominal tenderness. There is no guarding or rebound.     Comments: No ttp of the abd  Musculoskeletal:        General: Normal range of motion.     Cervical back: Normal range of motion and neck supple.  Feet:     Comments: R foot in ASO.  Mild diffuse ttp of the plantar feet. No trauma. No lesions. No skin breakdown. Good pedal pulses 2+ bilaterally.  Skin:    General: Skin is warm and dry.     Capillary Refill: Capillary refill takes less than 2 seconds.  Neurological:     Mental Status: He is alert and oriented to person, place, and time.  Psychiatric:        Mood and Affect: Mood and affect normal.        Speech: Speech normal.        Behavior: Behavior normal.    ED Results / Procedures / Treatments   Labs (all labs ordered are listed, but only abnormal results are displayed) Labs Reviewed  CBC WITH DIFFERENTIAL/PLATELET - Abnormal; Notable for the following components:      Result Value   RDW 16.2 (*)    All other components within normal limits  COMPREHENSIVE METABOLIC PANEL - Abnormal; Notable for the following components:   Sodium 133 (*)    Glucose, Bld 152 (*)    Calcium 8.7 (*)    Albumin 3.3 (*)    AST 71 (*)    ALT 81 (*)    All other components within normal limits  LIPASE, BLOOD    EKG None  Radiology DG Ankle Complete Right  Result Date: 05/29/2021 CLINICAL DATA:  Slipped, right ankle pain EXAM: RIGHT ANKLE - COMPLETE 3+ VIEW COMPARISON:  06/10/2014 FINDINGS: Frontal, oblique, and lateral views of the right ankle are obtained. No fracture, subluxation, or dislocation. Joint spaces are well preserved. Soft tissues are normal. IMPRESSION: 1. Unremarkable right  ankle. Electronically Signed   By: Randa Ngo M.D.   On: 05/29/2021 23:44   DG Foot Complete Right  Result Date: 05/30/2021 CLINICAL DATA:  Fall injury, right foot pain. EXAM: RIGHT FOOT COMPLETE - 3+ VIEW COMPARISON:  Study of 06/10/2014. FINDINGS: There is no evidence of fracture or dislocation. There is no evidence of arthropathy or other focal bone abnormality. Soft  tissues are unremarkable. Mild hallux valgus is again noted. IMPRESSION: No evidence of fracture, dislocation or degenerative changes. Mild hallux valgus. Electronically Signed   By: Almira Bar M.D.   On: 05/30/2021 00:25    Procedures Procedures   Medications Ordered in ED Medications  acetaminophen (TYLENOL) tablet 650 mg (650 mg Oral Given 05/31/21 0732)    ED Course  I have reviewed the triage vital signs and the nursing notes.  Pertinent labs & imaging results that were available during my care of the patient were reviewed by me and considered in my medical decision making (see chart for details).    MDM Rules/Calculators/A&P                          Pt presenting for evaluation of abd pain, hunger, and foot pain. On exam, pt appears nontoxic. No ttp of the abd. No signs of injury or trauma of the feet. Abd pain likely 2/2 hunger. However, pt with underlying medical conditions including HIV. No labs in the last month. Offered labs vs food and monitoring, pt would like labs. Foot pain is likely due to being cold and homeless. No signs of infection.   On reevaluation, patient appears nontoxic.  Tolerating p.o. without difficulty.  Reports abdominal pain is improved.  Continues to have foot pain, however this is not new for him.  Discussed overall reassuring labs.  Discussed liver enzymes are slightly elevated.  Encouraged him to follow-up with the infectious disease clinic to restart HIV medication and for recheck of his liver enzymes.  At this time, patient appears safe for discharge.  Return precautions given.   Patient states he understands and agrees to plan.     Final Clinical Impression(s) / ED Diagnoses Final diagnoses:  Hungry, initial encounter  Generalized abdominal pain  Bilateral foot pain  Elevated LFTs    Rx / DC Orders ED Discharge Orders     None        Alveria Apley, PA-C 05/31/21 1009    Wynetta Fines, MD 06/03/21 [phone]

## 2021-06-13 ENCOUNTER — Emergency Department (HOSPITAL_COMMUNITY)
Admission: EM | Admit: 2021-06-13 | Discharge: 2021-06-13 | Disposition: A | Discharge status: Home / Self Care | Payer: Medicaid Other | Attending: Emergency Medicine | Admitting: Emergency Medicine

## 2021-06-13 ENCOUNTER — Other Ambulatory Visit: Payer: Self-pay

## 2021-06-13 ENCOUNTER — Encounter (HOSPITAL_COMMUNITY): Payer: Self-pay | Admitting: Emergency Medicine

## 2021-06-13 DIAGNOSIS — Z5321 Procedure and treatment not carried out due to patient leaving prior to being seen by health care provider: Secondary | ICD-10-CM | POA: Insufficient documentation

## 2021-06-13 DIAGNOSIS — R208 Other disturbances of skin sensation: Secondary | ICD-10-CM | POA: Diagnosis not present

## 2021-06-13 DIAGNOSIS — R202 Paresthesia of skin: Secondary | ICD-10-CM | POA: Insufficient documentation

## 2021-06-13 NOTE — ED Provider Notes (Signed)
Emergency Medicine Provider Triage Evaluation Note  Blake Chambers , a 30 y.o. male  was evaluated in triage.  Pt complains of burning and tingling in bilateral hands.  States he thinks it is from the cold.  Denies any injury.  Is homeless.  Has hx of osteomyelitis of the finger.  Review of Systems  Positive: Burning and numbness of hands Negative: Fever, injury  Physical Exam  There were no vitals taken for this visit. Gen:   Awake, no distress   Resp:  Normal effort  MSK:   Moves extremities without difficulty  Other:  Intact distal pulses, hands are warm, good cap refill  Medical Decision Making  Medically screening exam initiated at 6:08 AM.  Appropriate orders placed.  Blake Chambers was informed that the remainder of the evaluation will be completed by another provider, this initial triage assessment does not replace that evaluation, and the importance of remaining in the ED until their evaluation is complete.  Cold exposure   Roxy Horseman, PA-C 06/13/21 [id number]    Maia Plan, MD 06/13/21 2312

## 2021-06-13 NOTE — ED Triage Notes (Signed)
Patient [place] with EMS from street reports cold/numbness at both hands this morning , he is homeless , hot packs applied to both hands by EMS  with relief.

## 2021-06-13 NOTE — ED Notes (Signed)
Called 3x for vitals, no answer 

## 2021-06-21 ENCOUNTER — Emergency Department (HOSPITAL_COMMUNITY)
Admission: EM | Admit: 2021-06-21 | Discharge: 2021-06-21 | Disposition: A | Discharge status: Home / Self Care | Payer: Medicaid Other | Attending: Emergency Medicine | Admitting: Emergency Medicine

## 2021-06-21 ENCOUNTER — Emergency Department (HOSPITAL_COMMUNITY): Payer: Medicaid Other

## 2021-06-21 DIAGNOSIS — L03032 Cellulitis of left toe: Secondary | ICD-10-CM | POA: Diagnosis not present

## 2021-06-21 DIAGNOSIS — Z5321 Procedure and treatment not carried out due to patient leaving prior to being seen by health care provider: Secondary | ICD-10-CM | POA: Diagnosis not present

## 2021-06-21 NOTE — ED Triage Notes (Signed)
EMS stated, [employer] called and left toe infection but would not let EMS look at it. Only wakes up when shaken

## 2021-06-21 NOTE — ED Provider Triage Note (Signed)
Emergency Medicine Provider Triage Evaluation Note  Blake Chambers , a 31 y.o. male  was evaluated in triage.  Pt complains of left foot pain.  Refuses to answer any questions but is awake and able to tell me his name and the month.  Patient complains of left foot pain but declines to inform me how long his foot is been hurting.  When asked What she is drinking tonight he says "not enough "    Review of Systems  Positive: Left foot pain Negative: Fever  Physical Exam  BP (!) 125/91 (BP Location: Right Arm)    Pulse 89    Temp 98 F (36.7 C) (Oral)    Resp 16    SpO2 99%  Gen:   Awake, in no distress, drowsy Resp:  Normal effort  MSK:   Moves extremities without difficulty  Other:  Left foot initially wrapped with jump rope, elastic band and belt.  I removed these items for my evaluation.  Left foot with diffuse tenderness over the dorsum of the foot.  No bruising step-off deformity swelling.  Palpable DP and PT pulses  Medical Decision Making  Medically screening exam initiated at 7:46 AM.  Appropriate orders placed.  Blake Chambers was informed that the remainder of the evaluation will be completed by another provider, this initial triage assessment does not replace that evaluation, and the importance of remaining in the ED until their evaluation is complete.  Bizarre gentleman complaining of left foot pain admits to intoxication of alcohol.  Does not answer questions about recreational drug use.  Complaining of left foot pain.  Will obtain x-ray.   Blake Chambers, [place] 06/21/21 [phone]

## 2021-06-30 ENCOUNTER — Other Ambulatory Visit: Payer: Self-pay

## 2021-06-30 ENCOUNTER — Encounter (HOSPITAL_COMMUNITY): Payer: Self-pay

## 2021-06-30 ENCOUNTER — Emergency Department (HOSPITAL_COMMUNITY)
Admission: EM | Admit: 2021-06-30 | Discharge: 2021-07-01 | Disposition: A | Discharge status: Home / Self Care | Payer: Medicaid Other | Source: Home / Self Care | Attending: Emergency Medicine | Admitting: Emergency Medicine

## 2021-06-30 ENCOUNTER — Emergency Department (HOSPITAL_COMMUNITY)
Admission: EM | Admit: 2021-06-30 | Discharge: 2021-06-30 | Disposition: A | Discharge status: Home / Self Care | Payer: Medicaid Other | Attending: Emergency Medicine | Admitting: Emergency Medicine

## 2021-06-30 ENCOUNTER — Encounter (HOSPITAL_COMMUNITY): Payer: Self-pay | Admitting: *Deleted

## 2021-06-30 ENCOUNTER — Emergency Department (HOSPITAL_COMMUNITY): Payer: Medicaid Other

## 2021-06-30 DIAGNOSIS — Z21 Asymptomatic human immunodeficiency virus [HIV] infection status: Secondary | ICD-10-CM | POA: Insufficient documentation

## 2021-06-30 DIAGNOSIS — B353 Tinea pedis: Secondary | ICD-10-CM | POA: Insufficient documentation

## 2021-06-30 DIAGNOSIS — Z5900 Homelessness unspecified: Secondary | ICD-10-CM | POA: Diagnosis not present

## 2021-06-30 DIAGNOSIS — M109 Gout, unspecified: Secondary | ICD-10-CM | POA: Insufficient documentation

## 2021-06-30 DIAGNOSIS — M7989 Other specified soft tissue disorders: Secondary | ICD-10-CM | POA: Diagnosis present

## 2021-06-30 MED ORDER — IBUPROFEN 600 MG PO TABS: 600.0000 mg | ORAL_TABLET | Freq: Four times a day (QID) | ORAL | 0 refills | Status: DC | PRN

## 2021-06-30 MED ORDER — IBUPROFEN 400 MG PO TABS
400.0000 mg | ORAL_TABLET | Freq: Once | ORAL | Status: AC
  Administered 2021-06-30: 400 mg via ORAL
  Filled 2021-06-30: qty 1

## 2021-06-30 MED ORDER — CLOTRIMAZOLE 1 % EX CREA
TOPICAL_CREAM | Freq: Two times a day (BID) | CUTANEOUS | Status: DC
  Filled 2021-06-30: qty 15

## 2021-06-30 NOTE — ED Provider Notes (Signed)
[facility] EMERGENCY DEPARTMENT Provider Note   CSN: [id number] Arrival date & time: 06/30/21  0439     History  Chief Complaint  Patient presents with   Foot Pain    Blake Chambers is a 31 y.o. male.  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  He reports for 2 to 3 weeks now he has had pain and swelling of his left foot but then also issues with his right foot as well.  He has not had any fever or general malaise.  He reports that he only has 2 pairs of socks because his other close were swollen and his feet do often times get wet.  He does walk a lot throughout the day and reports he has fairly new shoes.  He is currently working on getting a place to live but right now he stays in a lot of different places.  The history is provided by the patient and medical records.  Foot Pain      Home Medications Prior to Admission medications   Medication Sig Start Date End Date Taking? Authorizing Provider  ibuprofen (ADVIL) 600 MG tablet Take 1 tablet (600 mg total) by mouth every 6 (six) hours as needed. 06/30/21  Yes Gwyneth Sprout, MD      Allergies    Tenofovir disoproxil    Review of Systems   Review of Systems  Physical Exam Updated Vital Signs BP 116/72    Pulse 94    Temp 97.9 F (36.6 C) (Oral)    Resp 18    SpO2 100%  Physical Exam Vitals and nursing note reviewed.  Constitutional:      General: He is not in acute distress.    Appearance: Normal appearance. He is normal weight.  HENT:     Head: Normocephalic.  Cardiovascular:     Rate and Rhythm: Normal rate.     Pulses: Normal pulses.  Pulmonary:     Effort: Pulmonary effort is normal.  Musculoskeletal:        General: Swelling and tenderness present.     Comments:  Scaling peeling rash present between toes on bilateral feet and large calluses present on the pad of the foot with flaking of the skin.  The left foot does have swelling and warmth over the first MTP joint with some tenderness with movement of the toe.  No open wounds or drainage  Skin:    Findings: Rash present.  Neurological:     Mental Status: He is alert. Mental status is at baseline.  Psychiatric:        Mood and Affect: Mood normal.        Behavior: Behavior normal.    ED Results / Procedures / Treatments   Labs (all labs ordered are listed, but only abnormal results are displayed) Labs Reviewed - No data to display  EKG None  Radiology DG Foot Complete Left  Result Date: 06/30/2021 CLINICAL DATA:  Pain and redness to the left foot.  No known injury. EXAM: LEFT FOOT - COMPLETE 3+ VIEW COMPARISON:  None. FINDINGS: There is no evidence of fracture or dislocation. There is no evidence of arthropathy or other focal bone abnormality. Soft tissues are unremarkable. IMPRESSION: Negative. Electronically Signed   By: Amie Portland M.D.   On: 06/30/2021  10:11    Procedures Procedures    Medications Ordered in ED Medications  clotrimazole (LOTRIMIN) 1 % cream ( Topical Given 06/30/21 1034)  ibuprofen (ADVIL) tablet 400 mg (400 mg Oral Given 06/30/21 1034)    ED Course/ Medical Decision Making/ A&P                           Medical Decision Making  Patient presenting today with complaints of issues with his feet.  Patient is homeless and does a lot of walking.  He does have calluses on the bottom of his feet but also appears to have tinea present.  His left foot is red at the first MTP joint and mildly warm and erythematous.  No evidence of open wounds or drainage.  Suspect most likely gout but patient does not have a history.  He does have a history of HIV and does not take any medication for that at this time.  He does have a prior history of osteomyelitis of his hand.  We will get  plain images to ensure no other acute findings  10:52 AM I independently interpreted pt's x-ray which was neg for acute process.  Suspect gout based on exam.  Pt given antifungal cream due to his social barriers.  He can be seen in the ID clinic and encouraged f/u for him.  He is currently working on getting a new living situation but at this time does not meet admission criteria at this time.        Final Clinical Impression(s) / ED Diagnoses Final diagnoses:  Tinea pedis of both feet  Acute gout involving toe of left foot, unspecified cause    Rx / DC Orders ED Discharge Orders          Ordered    ibuprofen (ADVIL) 600 MG tablet  Every 6 hours PRN        06/30/21 1050              Gwyneth Sprout, MD 06/30/21 1053

## 2021-06-30 NOTE — ED Provider Triage Note (Signed)
Emergency Medicine Provider Triage Evaluation Note  Azende Alger , a 31 y.o. male  was evaluated in triage.  Pt complains of foot pain and cold exposure.  States that he thinks he has an infection on his foot.  States that the pain is worsened with movement and walking.  Denies treatments PTA.  Review of Systems  Positive: Foot pain, cold Negative:   Physical Exam  BP 127/71    Pulse 93    Temp 97.9 F (36.6 C) (Oral)    Resp 16    SpO2 100%  Gen:   Awake, no distress   Resp:  Normal effort  MSK:   Moves extremities without difficulty  Other:  Calluses on feet  Medical Decision Making  Medically screening exam initiated at 5:09 AM.  Appropriate orders placed.  Elford Hissam was informed that the remainder of the evaluation will be completed by another provider, this initial triage assessment does not replace that evaluation, and the importance of remaining in the ED until their evaluation is complete.  Foot pain   Montine Circle, PA-C 06/30/21 [id number]

## 2021-06-30 NOTE — ED Triage Notes (Signed)
Pt states he thinks he has an infection in his foot.

## 2021-06-30 NOTE — ED Triage Notes (Signed)
Pt BIB EMS with reports of right foot pain since today. Pt was picked up off of [facility], he was told that he couldn't sit on the curb at [employer] and EMS was called.

## 2021-06-30 NOTE — Discharge Instructions (Signed)
Looks like you have some gout in the big toe probably from all the walking.  The x-ray is normal.  You also have athletes foot.  You should keep your feet as dry as you can.  When you are sitting getting the socks off and let your feet air out and change your socks frequently.  Take the ibuprofen as needed for the pain the left foot but no infection of the bone today.  Return if you start having fever or the swelling and pain gets worse.

## 2021-07-01 MED ORDER — ACETAMINOPHEN 325 MG PO TABS
650.0000 mg | ORAL_TABLET | Freq: Once | ORAL | Status: AC
  Administered 2021-07-01: 650 mg via ORAL
  Filled 2021-07-01: qty 2

## 2021-07-01 NOTE — ED Provider Notes (Signed)
[facility]-EMERGENCY DEPT Provider Note   CSN: [id number] Arrival date & time: 06/30/21  2349     History  Chief Complaint  Patient presents with   Foot Pain    Blake Chambers is a 31 y.o. male.  31 y/o male presenting for pain to feet, R>L. He was seen earlier today for these complaints. He denies any change in his symptoms or new trauma since prior evaluation. Has tried a topical cream for symptoms without improvement. Per EMS, was picked up off [facility] after he was told he was unable to sit on the curb at [employer]. Denies fever.  The history is provided by the patient. No language interpreter was used.  Foot Pain      Home Medications Prior to Admission medications   Medication Sig Start Date End Date Taking? Authorizing Provider  ibuprofen (ADVIL) 600 MG tablet Take 1 tablet (600 mg total) by mouth every 6 (six) hours as needed. 06/30/21   Gwyneth Sprout, MD      Allergies    Tenofovir disoproxil    Review of Systems   Review of Systems Ten systems reviewed and are negative for acute change, except as noted in the HPI.    Physical Exam Updated Vital Signs BP 131/78 (BP Location: Right Arm)    Pulse 95    Temp 97.9 F (36.6 C) (Oral)    Resp 16    Ht 5\' 9"  (1.753 m)    Wt 64.4 kg    SpO2 100%    BMI 20.97 kg/m  Physical Exam Vitals and nursing note reviewed.  Constitutional:      General: He is not in acute distress.    Appearance: He is well-developed. He is not diaphoretic.     Comments: Nontoxic appearing and in NAD  HENT:     Head: Normocephalic and atraumatic.  Eyes:     General: No scleral icterus.    Conjunctiva/sclera: Conjunctivae normal.  Cardiovascular:     Rate and Rhythm: Normal rate and regular rhythm.     Pulses: Normal pulses.     Comments: DP pulse 2+ in BLE Pulmonary:     Effort: Pulmonary effort is normal. No respiratory distress.     Comments: Respirations even and unlabored Musculoskeletal:         General: Normal range of motion.     Cervical back: Normal range of motion.     Comments: Areas of callus to plantar aspect of b/l feet. Skin macerated between toes. No drainage, weeping, erythema, heat touch, soft tissue swelling. Foul smelling.  Skin:    General: Skin is warm and dry.     Coloration: Skin is not pale.     Findings: No erythema or rash.  Neurological:     Mental Status: He is alert and oriented to person, place, and time.  Psychiatric:        Behavior: Behavior normal.    ED Results / Procedures / Treatments   Labs (all labs ordered are listed, but only abnormal results are displayed) Labs Reviewed - No data to display  EKG None  Radiology DG Foot Complete Left  Result Date: 06/30/2021 CLINICAL DATA:  Pain and redness to the left foot.  No known injury. EXAM: LEFT FOOT - COMPLETE 3+ VIEW COMPARISON:  None. FINDINGS: There is no evidence of fracture or dislocation. There is no evidence of arthropathy or other focal bone abnormality. Soft tissues are unremarkable. IMPRESSION: Negative. Electronically Signed  By: Amie Portland M.D.   On: 06/30/2021 10:11    Procedures Procedures    Medications Ordered in ED Medications  acetaminophen (TYLENOL) tablet 650 mg (650 mg Oral Given 07/01/21 7062)    ED Course/ Medical Decision Making/ A&P                           Medical Decision Making Risk OTC drugs.   31 y/o male with persistent c/o foot pain. Patient is homeless. Reports he has been doing a lot of walking. Denies trauma or injury since prior ED evaluation earlier today. Prior imaging reviewed by myself; negative for fracture, dislocation, bony deformity of the L foot. No erythema, heat to touch to the affected area; no concern for septic joint. Compartments in the affected extremity are soft.   Patient given Tylenol in the ED for pain prior to discharge. Plan for supportive management; primary care follow up as needed. Encouraged continuation of antifungal  cream. Return precautions discussed and provided. Patient discharged in stable condition with no unaddressed concerns.         Final Clinical Impression(s) / ED Diagnoses Final diagnoses:  Tinea pedis of both feet    Rx / DC Orders ED Discharge Orders     None         Antony Madura, PA-C 07/11/21 1402    Tilden Fossa, MD 07/12/21 [phone]

## 2021-07-01 NOTE — Discharge Instructions (Signed)
Continue use of the Lotrimin cream you received at your ED visit yesterday. Try and change your socks frequently and air them outside your boots to keep your feet dry. Follow up with a primary care doctor.

## 2021-07-05 ENCOUNTER — Ambulatory Visit: Payer: Medicaid Other | Admitting: Internal Medicine

## 2021-07-05 ENCOUNTER — Other Ambulatory Visit: Payer: Self-pay

## 2021-07-05 ENCOUNTER — Encounter (HOSPITAL_COMMUNITY): Payer: Self-pay

## 2021-07-05 ENCOUNTER — Emergency Department (HOSPITAL_COMMUNITY)
Admission: EM | Admit: 2021-07-05 | Discharge: 2021-07-05 | Disposition: A | Discharge status: Home / Self Care | Payer: Medicaid Other | Attending: Emergency Medicine | Admitting: Emergency Medicine

## 2021-07-05 DIAGNOSIS — M79672 Pain in left foot: Secondary | ICD-10-CM | POA: Insufficient documentation

## 2021-07-05 DIAGNOSIS — M79671 Pain in right foot: Secondary | ICD-10-CM | POA: Insufficient documentation

## 2021-07-05 NOTE — ED Notes (Signed)
Patient provided with bus pass per request

## 2021-07-05 NOTE — ED Provider Notes (Signed)
[facility]-EMERGENCY DEPT Provider Note   CSN: [id number] Arrival date & time: 07/05/21  0258     History  Chief Complaint  Patient presents with   Foot Pain    Blake Chambers is a 31 y.o. male.  The history is provided by the patient.  Foot Pain This is a chronic problem. The current episode started more than 1 week ago. The problem occurs constantly. The problem has not changed since onset.Pertinent negatives include no chest pain, no abdominal pain, no headaches and no shortness of breath. Nothing aggravates the symptoms. Nothing relieves the symptoms. The treatment provided no relief.  Chronic foot pain from walking.  No rashes.  No wounds, no f/c/r.      Home Medications Prior to Admission medications   Medication Sig Start Date End Date Taking? Authorizing Provider  ibuprofen (ADVIL) 600 MG tablet Take 1 tablet (600 mg total) by mouth every 6 (six) hours as needed. 06/30/21   Gwyneth Sprout, MD      Allergies    Tenofovir disoproxil    Review of Systems   Review of Systems  Constitutional:  Negative for fever.  HENT:  Negative for drooling.   Eyes:  Negative for redness.  Respiratory:  Negative for shortness of breath.   Cardiovascular:  Negative for chest pain.  Gastrointestinal:  Negative for abdominal pain.  Genitourinary:  Negative for difficulty urinating.  Musculoskeletal:  Negative for arthralgias and neck stiffness.  Skin:  Negative for rash.  Neurological:  Negative for headaches.  Psychiatric/Behavioral:  Negative for agitation.   All other systems reviewed and are negative.  Physical Exam Updated Vital Signs BP 140/89 (BP Location: Right Arm)    Pulse 89    Temp 97.9 F (36.6 C) (Oral)    Resp 16    Ht 5\' 9"  (1.753 m) Comment: Simultaneous filing. User may not have seen previous data.   Wt 65.8 kg Comment: Simultaneous filing. User may not have seen previous data.   SpO2 98%    BMI 21.41 kg/m  Physical Exam Vitals and  nursing note reviewed.  Constitutional:      General: He is not in acute distress.    Appearance: Normal appearance.  HENT:     Head: Normocephalic and atraumatic.     Nose: Nose normal.  Eyes:     Conjunctiva/sclera: Conjunctivae normal.     Pupils: Pupils are equal, round, and reactive to light.  Cardiovascular:     Rate and Rhythm: Normal rate and regular rhythm.     Pulses: Normal pulses.     Heart sounds: Normal heart sounds.  Pulmonary:     Effort: Pulmonary effort is normal.     Breath sounds: Normal breath sounds.  Abdominal:     General: Abdomen is flat. Bowel sounds are normal.     Palpations: Abdomen is soft.     Tenderness: There is no abdominal tenderness. There is no right CVA tenderness or guarding.  Musculoskeletal:        General: Normal range of motion.     Cervical back: Normal range of motion and neck supple.  Skin:    General: Skin is warm and dry.     Capillary Refill: Capillary refill takes less than 2 seconds.  Neurological:     General: No focal deficit present.     Mental Status: He is alert and oriented to person, place, and time.     Deep Tendon Reflexes: Reflexes normal.  Psychiatric:  Mood and Affect: Mood normal.        Behavior: Behavior normal.    ED Results / Procedures / Treatments   Labs (all labs ordered are listed, but only abnormal results are displayed) Labs Reviewed - No data to display  EKG None  Radiology No results found.  Procedures Procedures    Medications Ordered in ED Medications - No data to display  ED Course/ Medical Decision Making/ A&P                           Medical Decision Making Amount and/or Complexity of Data Reviewed External Data Reviewed: notes. Discussion of management or test interpretation with external provider(s): Patient does not have signs of infection.  He denies trauma.  I briefly considered Xrays of the feet but there is no wounds nor tenderness.  Pulses are intact and patient  is walking on them.  New socks given and will refer to podiatry.      Final Clinical Impression(s) / ED Diagnoses Return for intractable cough, coughing up blood, fevers > 100.4 unrelieved by medication, shortness of breath, intractable vomiting, chest pain, shortness of breath, weakness, numbness, changes in speech, facial asymmetry, abdominal pain, passing out, Inability to tolerate liquids or food, cough, altered mental status or any concerns. No signs of systemic illness or infection. The patient is nontoxic-appearing on exam and vital signs are within normal limits.  I have reviewed the triage vital signs and the nursing notes. Pertinent labs & imaging results that were available during my care of the patient were reviewed by me and considered in my medical decision making (see chart for details). After history, exam, and medical workup I feel the patient has been appropriately medically screened and is safe for discharge home. Pertinent diagnoses were discussed with the patient. Patient was given return precautions.     Esbeydi Manago, MD 07/05/21 7989

## 2021-07-05 NOTE — ED Triage Notes (Signed)
Pt arrives EMS with c/o nontraumatic bilateral foot pain x 1 week. Ambulatory.

## 2021-07-11 ENCOUNTER — Emergency Department (HOSPITAL_COMMUNITY)
Admission: EM | Admit: 2021-07-11 | Discharge: 2021-07-11 | Disposition: A | Discharge status: Home / Self Care | Payer: Medicaid Other | Attending: Emergency Medicine | Admitting: Emergency Medicine

## 2021-07-11 ENCOUNTER — Other Ambulatory Visit: Payer: Self-pay

## 2021-07-11 ENCOUNTER — Encounter (HOSPITAL_COMMUNITY): Payer: Self-pay

## 2021-07-11 DIAGNOSIS — B353 Tinea pedis: Secondary | ICD-10-CM | POA: Insufficient documentation

## 2021-07-11 DIAGNOSIS — M79671 Pain in right foot: Secondary | ICD-10-CM | POA: Diagnosis present

## 2021-07-11 NOTE — ED Provider Notes (Signed)
[facility] EMERGENCY DEPARTMENT Provider Note   CSN: [id number] Arrival date & time: 07/11/21  0539     History  Chief Complaint  Patient presents with   Foot Pain    Jayquan Bradsher is a 31 y.o. male.  The history is provided by the patient and medical records.  Foot Pain  31 y.o. M here with bilateral foot pain.  States he was told he had fungus on his feet, has been using the cream and trying to change his shoes regularly but does not feel like it is helping.  He states he is supposed to go see "foot specialist" soon.  Denies fever.  Remains ambulatory.  Home Medications Prior to Admission medications   Medication Sig Start Date End Date Taking? Authorizing Provider  ibuprofen (ADVIL) 600 MG tablet Take 1 tablet (600 mg total) by mouth every 6 (six) hours as needed. 06/30/21   Gwyneth Sprout, MD      Allergies    Tenofovir disoproxil    Review of Systems   Review of Systems  Musculoskeletal:  Positive for arthralgias.  All other systems reviewed and are negative.  Physical Exam Updated Vital Signs BP (!) 115/102 (BP Location: Right Arm)    Pulse (!) 114    Temp 98.2 F (36.8 C) (Oral)    Resp 20    SpO2 97%   Physical Exam Vitals and nursing note reviewed.  Constitutional:      Appearance: He is well-developed.  HENT:     Head: Normocephalic and atraumatic.  Eyes:     Conjunctiva/sclera: Conjunctivae normal.     Pupils: Pupils are equal, round, and reactive to light.  Cardiovascular:     Rate and Rhythm: Normal rate and regular rhythm.     Heart sounds: Normal heart sounds.  Pulmonary:     Effort: Pulmonary effort is normal.     Breath sounds: Normal breath sounds.  Abdominal:     General: Bowel sounds are normal.     Palpations: Abdomen is soft.  Musculoskeletal:        General: Normal range of motion.     Cervical back: Normal range of motion.  Skin:    General: Skin is warm and dry.     Comments: Tinea pedis noted to both feet,  some skin breakdown noted between the toes  Neurological:     Mental Status: He is alert and oriented to person, place, and time.    ED Results / Procedures / Treatments   Labs (all labs ordered are listed, but only abnormal results are displayed) Labs Reviewed - No data to display  EKG None  Radiology No results found.  Procedures Procedures    Medications Ordered in ED Medications - No data to display  ED Course/ Medical Decision Making/ A&P                           Medical Decision Making  31 y.o. M here with bilateral foot pain.  Has tinea of both feet with some breakdown noted between the toes.  He remains ambulatory in triage.  Can continue using OTC ointment.  Has been referred to podiatry already.  Stable for discharge.  Can return here for new concerns.  Final Clinical Impression(s) / ED Diagnoses Final diagnoses:  Tinea pedis of both feet    Rx / DC Orders ED Discharge Orders     None  Garlon Hatchet, PA-C 07/11/21 0342    Tilden Fossa, MD 07/11/21 [phone]

## 2021-07-11 NOTE — ED Triage Notes (Signed)
Pt reports foot pain that has been going on for weeks, due to excessive walking, pt has been given cream and it is not working

## 2021-07-11 NOTE — Discharge Instructions (Addendum)
Continue using topical cream, keep socks and shoes dry.  Try to let feet air out throughout the day. Follow-up with your doctor. Return here for new concerns.

## 2021-07-12 ENCOUNTER — Ambulatory Visit: Payer: Medicaid Other | Admitting: Pharmacist

## 2021-07-12 NOTE — Progress Notes (Unsigned)
Cumulative HIV Genotype Data  Genotype Dates: 01/16/2011; 10/10/13; 08/01/14  RT Mutations S3I, E6K, I37N, N57K, I63P, K64L, G93L, D123E, E204K, E248Q, L283I, R284K, A288S, V292I, I293V, G333E, Q334HN, G335S  PI Mutations R57K, L63P, I64L, I93L  Integrase Mutations Not done   Interpretation of Genotype Data per [employer] HIV Drug Resistance Database:  Nucleoside RTIs  Abacavir - susceptible  Zidovudine - susceptible  Emtricitabine - susceptible  Lamivudine - susceptible  Tenofovir - susceptible    Non-Nucleoside RTIs  Doravirine - susceptible  Efavirenz - susceptible  Etravirine - susceptible  Nevirapine - susceptible  Rilpivirine - susceptible    Protease Inhibitors  Atazanavir - susceptible  Darunavir - susceptible  Lopinavir - susceptible     Donald Pore, PharmD Pharmacy Resident 07/12/2021, 11:46 AM

## 2021-07-14 ENCOUNTER — Emergency Department (HOSPITAL_COMMUNITY)
Admission: EM | Admit: 2021-07-14 | Discharge: 2021-07-15 | Disposition: A | Discharge status: Home / Self Care | Payer: Medicaid Other | Attending: Emergency Medicine | Admitting: Emergency Medicine

## 2021-07-14 ENCOUNTER — Encounter (HOSPITAL_COMMUNITY): Payer: Self-pay

## 2021-07-14 ENCOUNTER — Other Ambulatory Visit: Payer: Self-pay

## 2021-07-14 DIAGNOSIS — T698XXA Other specified effects of reduced temperature, initial encounter: Secondary | ICD-10-CM | POA: Diagnosis present

## 2021-07-14 DIAGNOSIS — M79642 Pain in left hand: Secondary | ICD-10-CM | POA: Diagnosis not present

## 2021-07-14 DIAGNOSIS — R209 Unspecified disturbances of skin sensation: Secondary | ICD-10-CM | POA: Diagnosis not present

## 2021-07-14 DIAGNOSIS — T699XXA Effect of reduced temperature, unspecified, initial encounter: Secondary | ICD-10-CM

## 2021-07-14 DIAGNOSIS — X31XXXA Exposure to excessive natural cold, initial encounter: Secondary | ICD-10-CM | POA: Diagnosis not present

## 2021-07-14 NOTE — ED Triage Notes (Signed)
[place] with complaints of finger numbness and cold. Pt states he can't feel his fingers from the middle knuckle to the tips of his fingers. Upon assessment, fingers are cool to touch but have cap refill <3 secs. Pulse motor sensation intact distally.

## 2021-07-15 ENCOUNTER — Encounter (HOSPITAL_COMMUNITY): Payer: Self-pay | Admitting: Student

## 2021-07-15 NOTE — ED Provider Notes (Signed)
[facility]ER[facility] DEPARTMENT Provider Note   CSN: [id number] Arrival date & time: 07/14/21  2246     History  Chief Complaint  Patient presents with   Hand Pain    Blake Chambers is a 31 y.o. 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 Reports his finger tips are all cold, uncomfortable, and tingling after being out in the cold rain. Alleviated completely now that he has had time to sit inside. No other complaints at this time   HPI     Home Medications Prior to Admission medications   Medication Sig Start Date End Date Taking? Authorizing Provider  ibuprofen (ADVIL) 600 MG tablet Take 1 tablet (600 mg total) by mouth every 6 (six) hours as needed. 06/30/21   Blanchie Dessert, MD      Allergies    Tenofovir disoproxil    Review of Systems   Review of Systems  Constitutional:  Negative for fever.  Respiratory:  Negative for shortness of breath.   Cardiovascular:  Negative for chest pain.  Musculoskeletal:  Positive for arthralgias.  Neurological:  Positive for numbness.  All other systems reviewed and are negative.  Physical Exam Updated Vital Signs BP (!) 152/139    Pulse 99    Temp 98 F (36.7 C) (Oral)    Resp 16    Ht 5\' 9"  (1.753 m)    Wt 65 kg    SpO2 98%    BMI 21.16 kg/m  Physical Exam Vitals and nursing note reviewed.  Constitutional:      General: He is not in acute distress.    Appearance: He is not toxic-appearing.  HENT:     Head: Normocephalic and atraumatic.  Cardiovascular:     Rate and Rhythm: Normal rate and regular rhythm.     Comments: Symmetric palpable radial pulses.  Pulmonary:     Effort: Pulmonary effort is normal.     Breath sounds: Normal breath sounds.  Abdominal:     General: There is no distension.  Musculoskeletal:     Cervical back: Neck supple.      Comments: Upper extremities: chronic deformity at the left 5th PIP joint. No significant erythema or drainage. Moving all joints with the exception of the 5th digit. IP joints. No focal bony tenderness to the hands/wrists.   Skin:    General: Skin is warm and dry.     Capillary Refill: Capillary refill takes less than 2 seconds.  Neurological:     Mental Status: He is alert.     Comments: Sensation grossly intact to bilateral upper extremities. 5/5 symmetric grip strength.     ED Results / Procedures / Treatments   Labs (all labs ordered are listed, but only abnormal results are displayed) Labs Reviewed - No data to display  EKG None  Radiology No results found.  Procedures Procedures    Medications Ordered in ED Medications - No data to display  ED Course/ Medical Decision Making/ A&P                           Medical Decision Making  Patient presents to the ED with paresthesias, pain, and coldness to his fingers after being outside in the cold rain- completely resolved since sitting in the ED. Initial BP likely inaccurate diastolic BP-  repeat improved, doubt HTN emergency.   Blood pressure 102/67, pulse 72, temperature 98.4 F (36.9 C), temperature source Oral, resp. rate 18, height 5\' 9"  (1.753 m), weight 65 kg, SpO2 95 %.   On exam left 5th finger chronic deformity noted, no obvious acute infection. Digits are all well perfused at this time with < 2 second cap refill. Sensation grossly intact, good grip strength. Patient provided opportunity to rest/sleep in the ED for several hours. Appears appropriate for discharge with resources.         Final Clinical Impression(s) / ED Diagnoses Final diagnoses:  Cold exposure, initial encounter    Rx / DC Orders ED Discharge Orders     None         Amaryllis Dyke, PA-C 07/15/21 0601    Quintella Reichert, MD 07/15/21 [phone]

## 2021-07-18 ENCOUNTER — Other Ambulatory Visit (HOSPITAL_COMMUNITY): Payer: Self-pay

## 2021-07-25 ENCOUNTER — Ambulatory Visit: Payer: Medicaid Other | Admitting: Pharmacist

## 2021-07-26 ENCOUNTER — Encounter (HOSPITAL_COMMUNITY): Payer: Self-pay | Admitting: Emergency Medicine

## 2021-07-26 ENCOUNTER — Other Ambulatory Visit: Payer: Self-pay

## 2021-07-26 ENCOUNTER — Emergency Department (HOSPITAL_COMMUNITY)
Admission: EM | Admit: 2021-07-26 | Discharge: 2021-07-26 | Disposition: A | Discharge status: Home / Self Care | Payer: Medicaid Other | Attending: Emergency Medicine | Admitting: Emergency Medicine

## 2021-07-26 ENCOUNTER — Emergency Department (HOSPITAL_COMMUNITY): Payer: Medicaid Other

## 2021-07-26 ENCOUNTER — Emergency Department (HOSPITAL_COMMUNITY)
Admission: EM | Admit: 2021-07-26 | Discharge: 2021-07-26 | Disposition: A | Discharge status: Home / Self Care | Payer: Medicaid Other | Source: Home / Self Care | Attending: Emergency Medicine | Admitting: Emergency Medicine

## 2021-07-26 DIAGNOSIS — E162 Hypoglycemia, unspecified: Secondary | ICD-10-CM | POA: Diagnosis not present

## 2021-07-26 DIAGNOSIS — F1510 Other stimulant abuse, uncomplicated: Secondary | ICD-10-CM | POA: Diagnosis not present

## 2021-07-26 DIAGNOSIS — Z21 Asymptomatic human immunodeficiency virus [HIV] infection status: Secondary | ICD-10-CM | POA: Insufficient documentation

## 2021-07-26 DIAGNOSIS — R109 Unspecified abdominal pain: Secondary | ICD-10-CM | POA: Diagnosis not present

## 2021-07-26 DIAGNOSIS — R072 Precordial pain: Secondary | ICD-10-CM | POA: Insufficient documentation

## 2021-07-26 DIAGNOSIS — Z79899 Other long term (current) drug therapy: Secondary | ICD-10-CM | POA: Diagnosis not present

## 2021-07-26 DIAGNOSIS — E860 Dehydration: Secondary | ICD-10-CM | POA: Diagnosis not present

## 2021-07-26 DIAGNOSIS — M79641 Pain in right hand: Secondary | ICD-10-CM | POA: Insufficient documentation

## 2021-07-26 DIAGNOSIS — R944 Abnormal results of kidney function studies: Secondary | ICD-10-CM | POA: Diagnosis not present

## 2021-07-26 DIAGNOSIS — M79642 Pain in left hand: Secondary | ICD-10-CM | POA: Insufficient documentation

## 2021-07-26 DIAGNOSIS — R519 Headache, unspecified: Secondary | ICD-10-CM | POA: Diagnosis present

## 2021-07-26 DIAGNOSIS — M25551 Pain in right hip: Secondary | ICD-10-CM | POA: Insufficient documentation

## 2021-07-26 LAB — CBC WITH DIFFERENTIAL/PLATELET
Abs Immature Granulocytes: 0.02 10*3/uL (ref 0.00–0.07)
Basophils Absolute: 0 10*3/uL (ref 0.0–0.1)
Basophils Relative: 1 %
Eosinophils Absolute: 0.1 10*3/uL (ref 0.0–0.5)
Eosinophils Relative: 1 %
HCT: 45.1 % (ref 39.0–52.0)
Hemoglobin: 14.5 g/dL (ref 13.0–17.0)
Immature Granulocytes: 0 %
Lymphocytes Relative: 28 %
Lymphs Abs: 1.7 10*3/uL (ref 0.7–4.0)
MCH: 26.8 pg (ref 26.0–34.0)
MCHC: 32.2 g/dL (ref 30.0–36.0)
MCV: 83.2 fL (ref 80.0–100.0)
Monocytes Absolute: 0.6 10*3/uL (ref 0.1–1.0)
Monocytes Relative: 11 %
Neutro Abs: 3.5 10*3/uL (ref 1.7–7.7)
Neutrophils Relative %: 59 %
Platelets: 248 10*3/uL (ref 150–400)
RBC: 5.42 MIL/uL (ref 4.22–5.81)
RDW: 15.7 % — ABNORMAL HIGH (ref 11.5–15.5)
WBC: 5.9 10*3/uL (ref 4.0–10.5)
nRBC: 0 % (ref 0.0–0.2)

## 2021-07-26 LAB — URINALYSIS, ROUTINE W REFLEX MICROSCOPIC
Bilirubin Urine: NEGATIVE
Glucose, UA: NEGATIVE mg/dL
Hgb urine dipstick: NEGATIVE
Ketones, ur: NEGATIVE mg/dL
Leukocytes,Ua: NEGATIVE
Nitrite: NEGATIVE
Protein, ur: NEGATIVE mg/dL
Specific Gravity, Urine: 1.024 (ref 1.005–1.030)
pH: 5 (ref 5.0–8.0)

## 2021-07-26 LAB — COMPREHENSIVE METABOLIC PANEL
ALT: 79 U/L — ABNORMAL HIGH (ref 0–44)
AST: 73 U/L — ABNORMAL HIGH (ref 15–41)
Albumin: 3.3 g/dL — ABNORMAL LOW (ref 3.5–5.0)
Alkaline Phosphatase: 76 U/L (ref 38–126)
Anion gap: 10 (ref 5–15)
BUN: 13 mg/dL (ref 6–20)
CO2: 22 mmol/L (ref 22–32)
Calcium: 8.9 mg/dL (ref 8.9–10.3)
Chloride: 105 mmol/L (ref 98–111)
Creatinine, Ser: 1.29 mg/dL — ABNORMAL HIGH (ref 0.61–1.24)
GFR, Estimated: >60 mL/min (ref >60)
Glucose, Bld: 55 mg/dL — ABNORMAL LOW (ref 70–99)
Potassium: 4.1 mmol/L (ref 3.5–5.1)
Sodium: 137 mmol/L (ref 135–145)
Total Bilirubin: 0.6 mg/dL (ref 0.3–1.2)
Total Protein: 8.1 g/dL (ref 6.5–8.1)

## 2021-07-26 LAB — RAPID URINE DRUG SCREEN, HOSP PERFORMED
Amphetamines: POSITIVE — AB
Barbiturates: NOT DETECTED
Benzodiazepines: NOT DETECTED
Cocaine: NOT DETECTED
Opiates: NOT DETECTED
Tetrahydrocannabinol: NOT DETECTED

## 2021-07-26 LAB — CBG MONITORING, ED
Glucose-Capillary: 108 mg/dL — ABNORMAL HIGH (ref 70–99)
Glucose-Capillary: 26 mg/dL — CL (ref 70–99)
Glucose-Capillary: 94 mg/dL (ref 70–99)
Glucose-Capillary: 121 mg/dL — ABNORMAL HIGH (ref 70–99)

## 2021-07-26 LAB — LIPASE, BLOOD: Lipase: 36 U/L (ref 11–51)

## 2021-07-26 MED ORDER — DEXTROSE 50 % IV SOLN
INTRAVENOUS | Status: AC
  Administered 2021-07-26: 50 mL via INTRAVENOUS
  Filled 2021-07-26: qty 50

## 2021-07-26 MED ORDER — DIPHENHYDRAMINE HCL 50 MG/ML IJ SOLN
25.0000 mg | Freq: Once | INTRAMUSCULAR | Status: AC
  Administered 2021-07-26: 25 mg via INTRAVENOUS
  Filled 2021-07-26: qty 1

## 2021-07-26 MED ORDER — DEXTROSE 50 % IV SOLN: 1.0000 | Freq: Once | INTRAVENOUS | Status: AC

## 2021-07-26 MED ORDER — SODIUM CHLORIDE 0.9 % IV BOLUS
1000.0000 mL | Freq: Once | INTRAVENOUS | Status: AC
  Administered 2021-07-26: 1000 mL via INTRAVENOUS

## 2021-07-26 MED ORDER — METOCLOPRAMIDE HCL 5 MG/ML IJ SOLN
10.0000 mg | Freq: Once | INTRAMUSCULAR | Status: AC
  Administered 2021-07-26: 10 mg via INTRAVENOUS
  Filled 2021-07-26: qty 2

## 2021-07-26 MED ORDER — KETOROLAC TROMETHAMINE 15 MG/ML IJ SOLN
15.0000 mg | Freq: Once | INTRAMUSCULAR | Status: AC
  Administered 2021-07-26: 15 mg via INTRAVENOUS
  Filled 2021-07-26: qty 1

## 2021-07-26 NOTE — ED Provider Notes (Signed)
[facility] EMERGENCY DEPARTMENT Provider Note   CSN: [id number] Arrival date & time: 07/26/21  0703     History  Chief Complaint  Patient presents with   Headache   Flank Pain    Blake Chambers is a 31 y.o. male with PMHx HIV (not on antiretroviral meds) , syphilis, bipolar disorder, polysubstance abuse, and homelessness who presents to the ED via EMS with complaint of gradual onset, constant, diffuse, achy, headache that began earlier today. When asked specifically when it started patient reports "on the way here." He also complains of light sensitivity and nausea as well as left sided pain. Pt points to his left flank when describing the pain - denies any trauma or urinary symptoms. Pt is poor historian and frequently falls asleep during examination. He mentions to me he has not slept in 1.5 days however does not relay why.   Per chart review pt has had 21 ED visits in the past 6 months.   The history is provided by the patient, medical records and the EMS personnel.      Home Medications Prior to Admission medications   Medication Sig Start Date End Date Taking? Authorizing Provider  ibuprofen (ADVIL) 600 MG tablet Take 1 tablet (600 mg total) by mouth every 6 (six) hours as needed. 06/30/21   Blanchie Dessert, MD      Allergies    Tenofovir disoproxil    Review of Systems   Review of Systems  Constitutional:  Negative for fever.  Eyes:  Negative for visual disturbance.  Gastrointestinal:  Positive for nausea. Negative for vomiting.  Genitourinary:  Positive for flank pain. Negative for dysuria and hematuria.  Musculoskeletal:  Negative for neck stiffness.  Neurological:  Positive for headaches.  All other systems reviewed and are negative.  Physical Exam Updated Vital Signs BP (!) 124/91    Pulse 82    Temp (!) 97.4 F (36.3 C) (Oral)    Resp 14    SpO2 100%  Physical Exam Vitals and nursing note reviewed.  Constitutional:      Appearance: He  is not ill-appearing or diaphoretic.     Comments: Frequently falling asleep during examination however easily arousable.   HENT:     Head: Normocephalic and atraumatic.  Eyes:     Extraocular Movements: Extraocular movements intact.     Conjunctiva/sclera: Conjunctivae normal.     Pupils: Pupils are equal, round, and reactive to light.  Cardiovascular:     Rate and Rhythm: Normal rate and regular rhythm.  Pulmonary:     Effort: Pulmonary effort is normal.     Breath sounds: Normal breath sounds. No wheezing, rhonchi or rales.     Comments: + left posterolateral rib TTP Chest:     Chest wall: Tenderness present.  Abdominal:     Palpations: Abdomen is soft.     Tenderness: There is abdominal tenderness.     Comments: + left flank TTP  Musculoskeletal:        General: Tenderness present.     Cervical back: Neck supple.     Comments: + left hip TTP  Skin:    General: Skin is warm and dry.     Findings: No rash.  Neurological:     Mental Status: He is alert.     Comments: Alert and oriented to self, place, time and event.   Speech is fluent, clear without dysarthria or dysphasia.   Strength 5/5 in upper/lower extremities   Sensation intact in  upper/lower extremities   No pronator drift.  Normal finger-to-nose and feet tapping.  CN I not tested  CN II grossly intact visual fields bilaterally. Did not visualize posterior eye.  CN III, IV, VI PERRLA and EOMs intact bilaterally  CN V Intact sensation to sharp and light touch to the face  CN VII facial movements symmetric  CN VIII not tested  CN IX, X no uvula deviation, symmetric rise of soft palate  CN XI 5/5 SCM and trapezius strength bilaterally  CN XII Midline tongue protrusion, symmetric L/R movements      ED Results / Procedures / Treatments   Labs (all labs ordered are listed, but only abnormal results are displayed) Labs Reviewed  CBC WITH DIFFERENTIAL/PLATELET - Abnormal; Notable for the following components:       Result Value   RDW 15.7 (*)    All other components within normal limits  COMPREHENSIVE METABOLIC PANEL - Abnormal; Notable for the following components:   Glucose, Bld 55 (*)    Creatinine, Ser 1.29 (*)    Albumin 3.3 (*)    AST 73 (*)    ALT 79 (*)    All other components within normal limits  RAPID URINE DRUG SCREEN, HOSP PERFORMED - Abnormal; Notable for the following components:   Amphetamines POSITIVE (*)    All other components within normal limits  CBG MONITORING, ED - Abnormal; Notable for the following components:   Glucose-Capillary 26 (*)    All other components within normal limits  CBG MONITORING, ED - Abnormal; Notable for the following components:   Glucose-Capillary 108 (*)    All other components within normal limits  CBG MONITORING, ED - Abnormal; Notable for the following components:   Glucose-Capillary 121 (*)    All other components within normal limits  RESP PANEL BY RT-PCR (FLU A&B, COVID) ARPGX2  LIPASE, BLOOD  URINALYSIS, ROUTINE W REFLEX MICROSCOPIC  CBG MONITORING, ED  CBG MONITORING, ED    EKG EKG Interpretation  Date/Time:  Friday July 26 2021 12:33:01 EST Ventricular Rate:  79 PR Interval:  132 QRS Duration: 87 QT Interval:  388 QTC Calculation: 445 R Axis:   62 Text Interpretation: Ectopic atrial rhythm ST elevation suggests acute pericarditis No significant change since last tracing Confirmed by Isla Pence [phone]) on 07/26/2021 12:34:40 PM  Radiology DG Ribs Unilateral W/Chest Left  Result Date: 07/26/2021 CLINICAL DATA:  Pain EXAM: LEFT RIBS AND CHEST - 3 VIEW COMPARISON:  Chest x-ray dated January 18, 2021 FINDINGS: No fracture or other bone lesions are seen involving the ribs. There is no evidence of pneumothorax or pleural effusion. Both lungs are clear. Heart size and mediastinal contours are within normal limits. IMPRESSION: Negative. Electronically Signed   By: Yetta Glassman M.D.   On: 07/26/2021 09:45   CT Head Wo  Contrast  Result Date: 07/26/2021 CLINICAL DATA:  Headache. EXAM: CT HEAD WITHOUT CONTRAST TECHNIQUE: Contiguous axial images were obtained from the base of the skull through the vertex without intravenous contrast. RADIATION DOSE REDUCTION: This exam was performed according to the departmental dose-optimization program which includes automated exposure control, adjustment of the mA and/or kV according to patient size and/or use of iterative reconstruction technique. COMPARISON:  December 08, 2018. FINDINGS: Brain: No evidence of acute infarction, hemorrhage, hydrocephalus, extra-axial collection or mass lesion/mass effect. Vascular: No hyperdense vessel or unexpected calcification. Skull: Normal. Negative for fracture or focal lesion. Sinuses/Orbits: No acute finding. Other: None. IMPRESSION: No acute intracranial abnormality seen. Electronically Signed  By: Marijo Conception M.D.   On: 07/26/2021 09:58   DG Hip Unilat W or Wo Pelvis 2-3 Views Left  Result Date: 07/26/2021 CLINICAL DATA:  Left groin pain. EXAM: DG HIP (WITH OR WITHOUT PELVIS) 2-3V LEFT COMPARISON:  Left femur x-rays dated Nov 09, 2020. FINDINGS: There is no evidence of hip fracture or dislocation. There is no evidence of arthropathy or other focal bone abnormality. IMPRESSION: Negative. Electronically Signed   By: Titus Dubin M.D.   On: 07/26/2021 09:46    Procedures Procedures    Medications Ordered in ED Medications  sodium chloride 0.9 % bolus 1,000 mL (1,000 mLs Intravenous New Bag/Given 07/26/21 1133)  ketorolac (TORADOL) 15 MG/ML injection 15 mg (15 mg Intravenous Given 07/26/21 1134)  metoCLOPramide (REGLAN) injection 10 mg (10 mg Intravenous Given 07/26/21 1134)  diphenhydrAMINE (BENADRYL) injection 25 mg (25 mg Intravenous Given 07/26/21 1134)  dextrose 50 % solution 50 mL (50 mLs Intravenous Given 07/26/21 1206)    ED Course/ Medical Decision Making/ A&P Clinical Course as of 07/26/21 1508  Fri Jul 26, 2021  1134 Resp  Panel by RT-PCR (Flu A&B, Covid) Nasopharyngeal Swab Pt refused COVID testing [MV]    Clinical Course User Index [MV] Blake Maize, PA-C                           Medical Decision Making 31 year old male with a history of homelessness presenting to the ED today with complaint of headache as well as left-sided flank pain.  Patient reports pain began while on his way to the ED both in his head and side.  Specifically why he called EMS patient begins to fall asleep.  During examination he falls asleep multiple times however is easily arousable.  He mentions he has not slept in 1.5 days.  On arrival to the ED today vitals are stable.  Patient is afebrile, nontachycardic and nontachypneic.  Blood pressure 120/84.  This is his 22nd ED visit in 6 months.  And frequently comes in for cold exposure.  Does have a history of HIV and is not on any antiretrovirals for approximately over a year.  Last CD4 count 33 (01/10/21) and RNA count 9,590.  On my exam he has no focal neurodeficits however difficult to assess why he is having a headache.  He does endorse some nausea and photosensitivity.  Per chart review patient was seen in the ED in the past for headaches.  Initially was treated with improvement in his headache and discharged home however returned the next day with continued headache.  A CT scan at that time without acute findings however plan for LP given history of HIV.  Patient does appear to have refused the LP at that time and left AGAINST MEDICAL ADVICE.   Will plan for further eval of headache including CT Head and labs. If negative will treat with headache cocktail. Question lack of sleep being the cause of pt's headache currently?   Regarding left sided flank pain - pt is TTP throughout left posterolateral ribs, flank, and left hip. He denies any trauma. No ecchymosis appreciated. He denies any urinary symptoms. Given pt's continued falling asleep during exam the exam is somewhat limited. Ddx  includes pain related to fall/trauma vs possible kidney stone vs other abdominal etiology vs possible malingering? Will plan for abdominal labs, left rib xray, left hip xray.   12:00 PM Labwork reassuring. On reevaluation pt appears much more somnolent than before.  I was under the impression he was eating food and drink provided to him however they are in his lap, untouched. CBG at 26. 1 amp D50 provided at this time.   Immediately after D50 patient more arousable. Maintaining airway. States he is hungry and requesting something to eat. His food in his lap was offered to him and he ate the sandwich without difficulty. Placed on cardiac monitor at this time and taken to room for further evaluation. Suspect given UDS positive for meth that pt likely has not prioritized eating or sleeping over the past few days and has landed himself into hypoglycemic event. No hx of diabetes.   Repeat CBG 94. Will continue to monitor. Pt offered food and drink however declined and wants to sleep. I do suspect adequate sleep and hydration will likely improve patient's overall mental status. I suspect his headache is related to same. If repeat CBGs continue to downtrend/patient's mental status does not change may consider admission. Attending physician Dr. Gilford Raid has evaluated patient as well and agrees with plan.   Repeat CBG 121. Pt fully asleep at this time. Will plan to ambulate for safety. If able to ambulate without assistance/patient more awake and alert he can be discharged home.   Pt has been monitored in the ED for 8 hours. He has been visualized ambulating successfully to the restroom without assistance. Currently eating. Will discharge home at this time as I suspect pt's symptoms are all related to dehydration/starvation after methamphetamine abuse.   Amount and/or Complexity of Data Reviewed Labs: ordered. Decision-making details documented in ED Course.    Details: CBC without leukocytosis. Hgb stable at  14.5 CMP with glucose 55. Pt currently eating and drinking. Creatinine 1.29 (slight increase from baseline however not consistent with AKI; receiving fluids currently via IV). AST and ALT 73/79 however appears to be baselien for patient. No other electrolyte abnormalities.  Lipase 36 U/A without signs of infection or hgb to suggest stone UDS positive for amphetamines Radiology: ordered.    Details: CT head negative for acute findings Xray of ribs negative Xray of hip negative ECG/medicine tests: ordered.  Risk Prescription drug management. Decision regarding hospitalization. Diagnosis or treatment significantly limited by social determinants of health. Risk Details: Pt is homeless           Final Clinical Impression(s) / ED Diagnoses Final diagnoses:  Methamphetamine abuse ([place])  Dehydration  Hypoglycemia, unspecified  Bad headache    Rx / DC Orders ED Discharge Orders     None        Discharge Instructions      Please discontinue meth use as this is likely the cause of your headache, fatigue/difficulty sleeping, as well as dehydration and low blood sugar levels.   Make sure to stay hydrated by drinking plenty of water and eating 3 square meals a day.  Follow up with your PCP for further evaluation   Return to the ED for any new/worsening symptoms       Blake Maize, PA-C 07/26/21 1509    Isla Pence, MD 07/27/21 [phone]

## 2021-07-26 NOTE — ED Provider Triage Note (Signed)
Emergency Medicine Provider Triage Evaluation Note  Clement Deneault , a 31 y.o. male  was evaluated in triage.  Pt complains of bilateral hand pain, hands feeling cold, states this happened just as soon as he was discharged just a moment ago and he was walking to the bus stop.  Review of Systems  Positive: Hand pain Negative: Injury  Physical Exam  Ht 5\' 9"  (1.753 m)    Wt 64.9 kg    BMI 21.12 kg/m  Gen:   Awake, no distress   Resp:  Normal effort  MSK:   Moves extremities without difficulty  Other:  Generalized pain with palpation of the hands, strong radial pulse present bilaterally.  Hands are both cool to the touch relative to warm temperature.  Capillary refill assessed, appears normal, difficult to assess in some of the digits due to chronic nail changes  Medical Decision Making  Medically screening exam initiated at 4:05 PM.  Appropriate orders placed.  Jahmil Macleod was informed that the remainder of the evaluation will be completed by another provider, this initial triage assessment does not replace that evaluation, and the importance of remaining in the ED until their evaluation is complete.    Naoma Diener, PA-C 07/26/21 1606

## 2021-07-26 NOTE — ED Triage Notes (Signed)
EMS stated,  pt has a headache and [place]

## 2021-07-26 NOTE — ED Notes (Signed)
Pt denies N/V, vision changes or photosensitivity to light.

## 2021-07-26 NOTE — ED Notes (Signed)
Provided pt w/turkey bag and ice water. 

## 2021-07-26 NOTE — ED Notes (Signed)
Pt is eating turkey sandwich

## 2021-07-26 NOTE — ED Notes (Signed)
Pt refused COVID test 

## 2021-07-26 NOTE — ED Triage Notes (Signed)
Patient states he was just discharged but his fingers are cold and hurting so he came back. C/o bilateral hand pain and tingling in tips of fingers.

## 2021-07-26 NOTE — Discharge Instructions (Addendum)
Please avoid drug use as it is negatively affecting your health.

## 2021-07-26 NOTE — ED Provider Notes (Signed)
[name] EMERGENCY DEPARTMENT Provider Note   CSN: [id number] Arrival date & time: 07/26/21  1555     History  Chief Complaint  Patient presents with   Hand Pain    Blake Chambers is a 31 y.o. male.  The history is provided by the patient and medical records. No language interpreter was used.  Hand Pain   31 year old male significant history of HIV, bipolar disorder, polysubstance abuse, homelessness, presenting for evaluation of pain to his hands and fingers.  Please note patient was seen earlier today with multiple complaints.  1 of which was headache along with lack of sleep.  Per chart review this is patient's 22nd ED visit within the past 6 months. Patient reports he was just discharged from the hospital however he is back because his fingers are cold and hurting.  Patient states hand pain started a few hours ago.  Denies any recent injury.  Patient also requesting for a bus pass.  Home Medications Prior to Admission medications   Medication Sig Start Date End Date Taking? Authorizing Provider  ibuprofen (ADVIL) 600 MG tablet Take 1 tablet (600 mg total) by mouth every 6 (six) hours as needed. 06/30/21   Gwyneth Sprout, MD      Allergies    Tenofovir disoproxil    Review of Systems   Review of Systems  Musculoskeletal:  Positive for arthralgias.  Skin:  Negative for wound.   Physical Exam Updated Vital Signs BP (!) 122/91 (BP Location: Right Arm)    Pulse 63    Temp 98.3 F (36.8 C) (Oral)    Resp 18    Ht 5\' 9"  (1.753 m)    Wt 64.9 kg    SpO2 100%    BMI 21.12 kg/m  Physical Exam Vitals and nursing note reviewed.  Constitutional:      General: He is not in acute distress.    Appearance: He is well-developed.     Comments: Sleepy but easily arousable.  Appears to be in no acute distress  HENT:     Head: Atraumatic.  Eyes:     Conjunctiva/sclera: Conjunctivae normal.  Musculoskeletal:     Cervical back: Neck supple.  Skin:     Findings: No rash.     Comments: Inspection of both hands demonstrate brisk cap refill to fingers, no signs of injury, no erythema edema or warmth.  Radial pulse 2+ bilaterally.    ED Results / Procedures / Treatments   Labs (all labs ordered are listed, but only abnormal results are displayed) Labs Reviewed - No data to display  EKG None  Radiology DG Ribs Unilateral W/Chest Left  Result Date: 07/26/2021 CLINICAL DATA:  Pain EXAM: LEFT RIBS AND CHEST - 3 VIEW COMPARISON:  Chest x-ray dated January 18, 2021 FINDINGS: No fracture or other bone lesions are seen involving the ribs. There is no evidence of pneumothorax or pleural effusion. Both lungs are clear. Heart size and mediastinal contours are within normal limits. IMPRESSION: Negative. Electronically Signed   By: January 20, 2021 M.D.   On: 07/26/2021 09:45   CT Head Wo Contrast  Result Date: 07/26/2021 CLINICAL DATA:  Headache. EXAM: CT HEAD WITHOUT CONTRAST TECHNIQUE: Contiguous axial images were obtained from the base of the skull through the vertex without intravenous contrast. RADIATION DOSE REDUCTION: This exam was performed according to the departmental dose-optimization program which includes automated exposure control, adjustment of the mA and/or kV according to patient size and/or use of iterative reconstruction technique. COMPARISON:  December 08, 2018. FINDINGS: Brain: No evidence of acute infarction, hemorrhage, hydrocephalus, extra-axial collection or mass lesion/mass effect. Vascular: No hyperdense vessel or unexpected calcification. Skull: Normal. Negative for fracture or focal lesion. Sinuses/Orbits: No acute finding. Other: None. IMPRESSION: No acute intracranial abnormality seen. Electronically Signed   By: Lupita Raider M.D.   On: 07/26/2021 09:58   DG Hip Unilat W or Wo Pelvis 2-3 Views Left  Result Date: 07/26/2021 CLINICAL DATA:  Left groin pain. EXAM: DG HIP (WITH OR WITHOUT PELVIS) 2-3V LEFT COMPARISON:  Left femur  x-rays dated Nov 09, 2020. FINDINGS: There is no evidence of hip fracture or dislocation. There is no evidence of arthropathy or other focal bone abnormality. IMPRESSION: Negative. Electronically Signed   By: Obie Dredge M.D.   On: 07/26/2021 09:46    Procedures Procedures    Medications Ordered in ED Medications - No data to display  ED Course/ Medical Decision Making/ A&P                           Medical Decision Making  BP (!) 122/91 (BP Location: Right Arm)    Pulse 63    Temp 98.3 F (36.8 C) (Oral)    Resp 18    Ht 5\' 9"  (1.753 m)    Wt 64.9 kg    SpO2 100%    BMI 21.12 kg/m   4:38 PM Patient with history of homelessness, polysubstance use, and HIV presents with complaints of pain to his hands bilaterally.  Patient report his hands are cold and this is ongoing for the past several hours.  Patient was noted to be sleepy but easily arousable.  He was seen earlier today with multiple complaints.  He was found to be hypoglycemic at that time and was given medication with improvement of his CBG.  It was thought that his excessive sleepiness and hypoglycemic state is due to polysubstance use and lack of eating.  I have considered my care in accordance to prior ER visit earlier today.  I do not think additional work-up is indicated.  Patient is arousable.  I encourage patient to avoid methamphetamine use or drug use as this is negatively impacting his health.  I do not think he has ongoing frostbite or any Vascular compromise causing hand pain.  I provided a list of resources for outpatient management of his health.  I have considered patient's social determinants of health in my care plan and have provided him with outpatient resources.        Final Clinical Impression(s) / ED Diagnoses Final diagnoses:  Pain in both hands    Rx / DC Orders ED Discharge Orders     None         , PA-C 07/26/21 1648    09/23/21, MD 07/26/21 2216

## 2021-07-26 NOTE — ED Notes (Signed)
Reviewed discharge instructions with patient. Follow-up care reviewed. Patient verbalized understanding. Patient A&Ox4, VSS, and ambulatory with steady gait upon discharge.  

## 2021-07-26 NOTE — ED Notes (Signed)
I provided pt with 2 [place] bags and 2 whole milks to leave with to help maintain glucose

## 2021-07-26 NOTE — ED Notes (Addendum)
Blake Chambers, [place] is aware of pt's glucose.

## 2021-07-26 NOTE — Discharge Instructions (Signed)
Please discontinue meth use as this is likely the cause of your headache, fatigue/difficulty sleeping, as well as dehydration and low blood sugar levels.   Make sure to stay hydrated by drinking plenty of water and eating 3 square meals a day.  Follow up with your PCP for further evaluation   Return to the ED for any new/worsening symptoms

## 2021-07-27 ENCOUNTER — Emergency Department (HOSPITAL_COMMUNITY)
Admission: EM | Admit: 2021-07-27 | Discharge: 2021-07-27 | Disposition: A | Discharge status: Home / Self Care | Payer: Medicaid Other | Attending: Emergency Medicine | Admitting: Emergency Medicine

## 2021-07-27 ENCOUNTER — Other Ambulatory Visit: Payer: Self-pay

## 2021-07-27 DIAGNOSIS — R519 Headache, unspecified: Secondary | ICD-10-CM

## 2021-07-27 DIAGNOSIS — R42 Dizziness and giddiness: Secondary | ICD-10-CM | POA: Insufficient documentation

## 2021-07-27 LAB — CBG MONITORING, ED: Glucose-Capillary: 141 mg/dL — ABNORMAL HIGH (ref 70–99)

## 2021-07-27 MED ORDER — MECLIZINE HCL 25 MG PO TABS
25.0000 mg | ORAL_TABLET | Freq: Once | ORAL | Status: AC
  Administered 2021-07-27: 25 mg via ORAL
  Filled 2021-07-27: qty 1

## 2021-07-27 MED ORDER — ACETAMINOPHEN 325 MG PO TABS
650.0000 mg | ORAL_TABLET | Freq: Once | ORAL | Status: AC
  Administered 2021-07-27: 650 mg via ORAL
  Filled 2021-07-27: qty 2
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
## 2021-07-27 NOTE — Discharge Instructions (Addendum)
You are having a headache. No specific cause was found today for your headache. It may have been a migraine or other cause of headache. Stress, anxiety, fatigue, and depression are common triggers for headaches. Your headache today does not appear to be life-threatening or require hospitalization, but often the exact cause of headaches is not determined in the emergency department. Therefore, follow-up with your doctor is very important to find out what may have caused your headache, and whether or not you need any further diagnostic testing or treatment. Sometimes headaches can appear benign (not harmful), but then more serious symptoms can develop which should prompt an immediate re-evaluation by your doctor or the emergency department. SEEK MEDICAL ATTENTION IF: You develop possible problems with medications prescribed.  The medications don't resolve your headache, if it recurs , or if you have multiple episodes of vomiting or can't take fluids. You have a change from the usual headache. RETURN IMMEDIATELY IF you develop a sudden, severe headache or confusion, become poorly responsive or faint, develop a fever above 100.53F or problem breathing, have a change in speech, vision, swallowing, or understanding, or develop new weakness, numbness, tingling, incoordination, or have a seizure.  Get help right away if: You vomit or have diarrhea and are unable to eat or drink anything. You have problems talking, walking, swallowing, or using your arms, hands, or legs. You feel generally weak. You have any bleeding. You are not thinking clearly or you have trouble forming sentences. It may take a friend or family member to notice this. You have chest pain, abdominal pain, shortness of breath, or sweating. Your vision changes or you develop a severe headache.

## 2021-07-27 NOTE — ED Provider Notes (Signed)
[facility] EMERGENCY DEPARTMENT Provider Note   CSN: [id number] Arrival date & time: 07/27/21  [id number]     History  Chief Complaint  Patient presents with   Dizziness    Blake Chambers is a 31 y.o. male who presents emergency department with chief complaint dizziness and headache.  This patient's 23rd visit to the emergency department in the last 6 months.  He has a history of homelessness Blake Chambers, HIV and polysubstance abuse.  Patient had a head CT performed within the last 24 hours and has been seen 3 times in the last 24 hours.  Patient has not taken anything for his headache.  He denies substance abuse tonight.   Dizziness     Home Medications Prior to Admission medications   Medication Sig Start Date End Date Taking? Authorizing Provider  ibuprofen (ADVIL) 600 MG tablet Take 1 tablet (600 mg total) by mouth every 6 (six) hours as needed. 06/30/21   Blanchie Dessert, MD      Allergies    Tenofovir disoproxil    Review of Systems   Review of Systems  Neurological:  Positive for dizziness.   Physical Exam Updated Vital Signs BP 121/85 (BP Location: Right Arm)    Pulse 86    Temp 97.8 F (36.6 C) (Oral)    Resp 16    SpO2 98%  Physical Exam Vitals and nursing note reviewed.  Constitutional:      General: He is not in acute distress.    Appearance: He is well-developed. He is not diaphoretic.  HENT:     Head: Normocephalic and atraumatic.  Eyes:     General: No scleral icterus.    Conjunctiva/sclera: Conjunctivae normal.  Cardiovascular:     Rate and Rhythm: Normal rate and regular rhythm.     Heart sounds: Normal heart sounds.  Pulmonary:     Effort: Pulmonary effort is normal. No respiratory distress.     Breath sounds: Normal breath sounds.  Abdominal:     Palpations: Abdomen is soft.     Tenderness: There is no abdominal tenderness.  Musculoskeletal:     Cervical back: Normal range of motion and neck supple.  Skin:    General: Skin is  warm and dry.  Neurological:     General: No focal deficit present.     Mental Status: He is alert and oriented to person, place, and time.     Cranial Nerves: No cranial nerve deficit.     Sensory: No sensory deficit.     Motor: No weakness.     Coordination: Coordination normal.     Gait: Gait normal.     Deep Tendon Reflexes: Reflexes normal.     Comments: Patient reluctant to participate in examination.  Patient was able to ambulate without assistance however appear to be affecting ataxia and keeping his eyes closed with walking.  I told the patient he could go sit back down and he immediately walked back to his wheelchair without ataxia.  Psychiatric:        Behavior: Behavior normal.    ED Results / Procedures / Treatments   Labs (all labs ordered are listed, but only abnormal results are displayed) Labs Reviewed  CBG MONITORING, ED - Abnormal; Notable for the following components:      Result Value   Glucose-Capillary 141 (*)    All other components within normal limits    EKG None  Radiology DG Ribs Unilateral W/Chest Left  Result Date: 07/26/2021 CLINICAL DATA:  Pain EXAM: LEFT RIBS AND CHEST - 3 VIEW COMPARISON:  Chest x-ray dated January 18, 2021 FINDINGS: No fracture or other bone lesions are seen involving the ribs. There is no evidence of pneumothorax or pleural effusion. Both lungs are clear. Heart size and mediastinal contours are within normal limits. IMPRESSION: Negative. Electronically Signed   By: Allegra Lai M.D.   On: 07/26/2021 09:45   CT Head Wo Contrast  Result Date: 07/26/2021 CLINICAL DATA:  Headache. EXAM: CT HEAD WITHOUT CONTRAST TECHNIQUE: Contiguous axial images were obtained from the base of the skull through the vertex without intravenous contrast. RADIATION DOSE REDUCTION: This exam was performed according to the departmental dose-optimization program which includes automated exposure control, adjustment of the mA and/or kV according to patient  size and/or use of iterative reconstruction technique. COMPARISON:  December 08, 2018. FINDINGS: Brain: No evidence of acute infarction, hemorrhage, hydrocephalus, extra-axial collection or mass lesion/mass effect. Vascular: No hyperdense vessel or unexpected calcification. Skull: Normal. Negative for fracture or focal lesion. Sinuses/Orbits: No acute finding. Other: None. IMPRESSION: No acute intracranial abnormality seen. Electronically Signed   By: Lupita Raider M.D.   On: 07/26/2021 09:58   DG Hip Unilat W or Wo Pelvis 2-3 Views Left  Result Date: 07/26/2021 CLINICAL DATA:  Left groin pain. EXAM: DG HIP (WITH OR WITHOUT PELVIS) 2-3V LEFT COMPARISON:  Left femur x-rays dated Nov 09, 2020. FINDINGS: There is no evidence of hip fracture or dislocation. There is no evidence of arthropathy or other focal bone abnormality. IMPRESSION: Negative. Electronically Signed   By: Obie Dredge M.D.   On: 07/26/2021 09:46    Procedures Procedures    Medications Ordered in ED Medications - No data to display  ED Course/ Medical Decision Making/ A&P                           Medical Decision Making  Patient here with headache and dizziness.Emergent considerations for headache include subarachnoid hemorrhage, meningitis, temporal arteritis, glaucoma, cerebral ischemia, carotid/vertebral dissection, intracranial tumor, Venous sinus thrombosis, carbon monoxide poisoning, acute or chronic subdural hemorrhage.  Other considerations include: Migraine, Cluster headache, Hypertension, Caffeine, alcohol, or drug withdrawal, Pseudotumor cerebri, Arteriovenous malformation, Head injury, Neurocysticercosis, Post-lumbar puncture, Preeclampsia, Tension headache, Sinusitis, Cervical arthritis, Refractive error causing strain, Dental abscess, Otitis media, Temporomandibular joint syndrome, Depression, Somatoform disorder (eg, somatization) Trigeminal neuralgia, Glossopharyngeal neuralgia.   Review of previous labs show the  patient was hypoglycemic.  His blood sugar is 141 here.  He does not appear to have any focal neurologic deficits.  His comorbidities and risk factors include a history of HIV, homelessness and polysubstance abuse.  Patient given Tylenol and meclizine.  Do not think he needs further evaluation at this time.  I considered CT imaging but given the fact that the patient had a head CT within the last 24 hours and does not appear to have any true focal neurologic deficits I do not think he needs a second 1 at this time.  Patient social barriers to care include homelessness and polysubstance abuse.  Patient will be discharged.   Blake Chambers presents with headache Given the large differential diagnosis for Blake Chambers, the decision making in this case is of high complexity.  After evaluating all of the data points in this case, the presentation of Blake Chambers is NOT consistent with skull fracture, meningitis/encephalitis, SAH/sentinel bleed, Intracranial Hemorrhage (ICH) (subdural/epidural), acute obstructive hydrocephalus, space occupying lesions, CVA, CO Poisoning, Basilar/vertebral  artery dissection, preeclampsia, cerebral venous thrombosis, hypertensive emergency, temporal Arteritis, Idiopathic Intracranial Hypertension (pseudotumor cerebri).  Strict return and follow-up precautions have been given by me personally or by detailed written instructions verbalized by nursing staff using the teach back method to patient/family/caregiver.  Data Reviewed/Counseling: I have reviewed the patient's vital signs, nursing notes, and other relevant tests/information. I had a detailed discussion regarding the historical points, exam findings, and any diagnostic results supporting the discharge diagnosis. I also discussed the need for outpatient follow-up and the need to return to the ED if symptoms worsen or if there are any questions or concerns that arise at [facility]  Final Clinical Impression(s) / ED  Diagnoses Final diagnoses:  None    Rx / DC Orders ED Discharge Orders     None         Margarita Mail, PA-C 07/27/21 0314    Ripley Fraise, MD 07/27/21 [phone]

## 2021-07-31 ENCOUNTER — Encounter (HOSPITAL_COMMUNITY): Payer: Self-pay

## 2021-07-31 ENCOUNTER — Other Ambulatory Visit: Payer: Self-pay

## 2021-07-31 ENCOUNTER — Emergency Department (HOSPITAL_COMMUNITY)
Admission: EM | Admit: 2021-07-31 | Discharge: 2021-07-31 | Disposition: A | Discharge status: Home / Self Care | Payer: Medicaid Other | Attending: Emergency Medicine | Admitting: Emergency Medicine

## 2021-07-31 DIAGNOSIS — Z760 Encounter for issue of repeat prescription: Secondary | ICD-10-CM | POA: Diagnosis not present

## 2021-07-31 DIAGNOSIS — Z5900 Homelessness unspecified: Secondary | ICD-10-CM | POA: Diagnosis not present

## 2021-07-31 NOTE — ED Provider Notes (Signed)
[facility] DEPT Provider Note   CSN: [id number] Arrival date & time: 07/31/21  0153     History  Chief Complaint  Patient presents with   Homeless    [place] is a 31 y.o. male with past medical history of bipolar affective, HIV, polysubstance use.  Presents to the emergency department with a chief complaint of needing medication refill and feeling cold.  Patient endorses that he is homeless.  Patient is unsure what medication he needs the refill for and is unsure how long he has been out of medication for.  Patient reports that he was outside earlier in the evening as he is homeless.  Patient denies any pain or discomfort, fever, chills.    HPI     Home Medications Prior to Admission medications   Medication Sig Start Date End Date Taking? Authorizing Provider  ibuprofen (ADVIL) 600 MG tablet Take 1 tablet (600 mg total) by mouth every 6 (six) hours as needed. 06/30/21   Blanchie Dessert, MD      Allergies    Tenofovir disoproxil    Review of Systems   Review of Systems  Constitutional:  Negative for chills and fever.  Eyes:  Negative for visual disturbance.  Respiratory:  Negative for shortness of breath.   Cardiovascular:  Negative for chest pain.  Gastrointestinal:  Negative for abdominal pain, nausea and vomiting.  Genitourinary:  Negative for difficulty urinating and dysuria.  Musculoskeletal:  Negative for back pain and neck pain.  Skin:  Negative for color change and rash.  Neurological:  Negative for dizziness, syncope, light-headedness and headaches.  Psychiatric/Behavioral:  Negative for confusion.    Physical Exam Updated Vital Signs BP 116/87 (BP Location: Left Arm)    Pulse 80    Temp 97.8 F (36.6 C) (Oral)    Resp 18    SpO2 100%  Physical Exam Vitals and nursing note reviewed.  Constitutional:      General: He is sleeping. He is not in acute distress.    Appearance: He is not ill-appearing, toxic-appearing  or diaphoretic.  HENT:     Head: Normocephalic.  Eyes:     General: No scleral icterus.       Right eye: No discharge.        Left eye: No discharge.  Cardiovascular:     Rate and Rhythm: Normal rate.  Pulmonary:     Effort: Pulmonary effort is normal. No respiratory distress.     Breath sounds: Normal breath sounds. No stridor.  Skin:    General: Skin is warm and dry.  Neurological:     General: No focal deficit present.     Mental Status: He is oriented to person, place, and time and easily aroused.     GCS: GCS eye subscore is 4. GCS verbal subscore is 5. GCS motor subscore is 6.  Psychiatric:        Behavior: Behavior is cooperative.    ED Results / Procedures / Treatments   Labs (all labs ordered are listed, but only abnormal results are displayed) Labs Reviewed - No data to display  EKG None  Radiology No results found.  Procedures Procedures    Medications Ordered in ED Medications - No data to display  ED Course/ Medical Decision Making/ A&P                           Medical Decision Making  Alert 31 year old male in no  acute distress, nontoxic-appearing.  Presents emergency department with a chief complaint of being cold and needing medication refill.  Information was obtained from patient.  Medical records were reviewed including previous provider notes.  Past medical history as noted in HPI which complicate his care.  Vital signs within normal limits.  Patient is sleeping comfortably however will easily arousable with voice.  Patient denies any pain or discomfort.  Patient is unsure which medications he needs a refill on or when he last took them.  Medical records were reviewed and no current or recent medications to reorder at this time.  We will have patient follow-up with [facility] at the wellness clinic for further medication management.  Patient reports being cold.  Temperature 97.8 F.  Patient is homeless.  We will give patient resources for shelters  in the area.  Social determinants of health affecting patient's care are homelessness.  Discussed results, findings, treatment and follow up. Patient advised of return precautions. Patient verbalized understanding and agreed with plan.         Final Clinical Impression(s) / ED Diagnoses Final diagnoses:  Homelessness  Medication refill    Rx / DC Orders ED Discharge Orders     None         Dyann Ruddle 07/31/21 1046    Valarie Merino, MD 07/31/21 1511

## 2021-07-31 NOTE — ED Triage Notes (Signed)
Pt BIB EMS. Pt states that he is cold and needs his medications refilled.

## 2021-07-31 NOTE — ED Notes (Signed)
I provided reinforced discharge education based off of discharge instructions. Pt acknowledged and understood my education. Pt had no further questions/concerns for provider/myself.  °

## 2021-07-31 NOTE — ED Notes (Signed)
Pt refused to Pharmacist, hospital obtain vital signs.

## 2021-07-31 NOTE — Discharge Instructions (Signed)
You came to the emergency department today for a medication refill.  Your medications were reviewed and there were none that I could refill today in the emergency department.  Please follow-up with the [facility] for further medication management.  I have attached information on shelters in the area please review this information.

## 2021-08-03 ENCOUNTER — Emergency Department (HOSPITAL_COMMUNITY): Payer: Medicaid Other

## 2021-08-03 ENCOUNTER — Encounter (HOSPITAL_COMMUNITY): Payer: Self-pay | Admitting: Emergency Medicine

## 2021-08-03 ENCOUNTER — Other Ambulatory Visit: Payer: Self-pay

## 2021-08-03 ENCOUNTER — Emergency Department (HOSPITAL_COMMUNITY)
Admission: EM | Admit: 2021-08-03 | Discharge: 2021-08-04 | Disposition: A | Discharge status: Home / Self Care | Payer: Medicaid Other | Attending: Emergency Medicine | Admitting: Emergency Medicine

## 2021-08-03 DIAGNOSIS — F1490 Cocaine use, unspecified, uncomplicated: Secondary | ICD-10-CM | POA: Diagnosis not present

## 2021-08-03 DIAGNOSIS — Z79899 Other long term (current) drug therapy: Secondary | ICD-10-CM | POA: Diagnosis not present

## 2021-08-03 DIAGNOSIS — R0789 Other chest pain: Secondary | ICD-10-CM

## 2021-08-03 DIAGNOSIS — F32A Depression, unspecified: Secondary | ICD-10-CM | POA: Diagnosis not present

## 2021-08-03 DIAGNOSIS — M25571 Pain in right ankle and joints of right foot: Secondary | ICD-10-CM | POA: Insufficient documentation

## 2021-08-03 DIAGNOSIS — F909 Attention-deficit hyperactivity disorder, unspecified type: Secondary | ICD-10-CM | POA: Diagnosis not present

## 2021-08-03 DIAGNOSIS — F4310 Post-traumatic stress disorder, unspecified: Secondary | ICD-10-CM | POA: Diagnosis not present

## 2021-08-03 DIAGNOSIS — F259 Schizoaffective disorder, unspecified: Secondary | ICD-10-CM | POA: Insufficient documentation

## 2021-08-03 DIAGNOSIS — R400 Somnolence: Secondary | ICD-10-CM | POA: Diagnosis not present

## 2021-08-03 DIAGNOSIS — Z5900 Homelessness unspecified: Secondary | ICD-10-CM | POA: Insufficient documentation

## 2021-08-03 DIAGNOSIS — R0602 Shortness of breath: Secondary | ICD-10-CM | POA: Insufficient documentation

## 2021-08-03 DIAGNOSIS — M25572 Pain in left ankle and joints of left foot: Secondary | ICD-10-CM | POA: Insufficient documentation

## 2021-08-03 DIAGNOSIS — F1590 Other stimulant use, unspecified, uncomplicated: Secondary | ICD-10-CM | POA: Insufficient documentation

## 2021-08-03 DIAGNOSIS — R079 Chest pain, unspecified: Secondary | ICD-10-CM | POA: Insufficient documentation

## 2021-08-03 DIAGNOSIS — G479 Sleep disorder, unspecified: Secondary | ICD-10-CM | POA: Insufficient documentation
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
## 2021-08-04 ENCOUNTER — Encounter (HOSPITAL_COMMUNITY): Payer: Self-pay | Admitting: Emergency Medicine

## 2021-08-04 ENCOUNTER — Ambulatory Visit (HOSPITAL_COMMUNITY)
Admission: EM | Admit: 2021-08-04 | Discharge: 2021-08-04 | Disposition: A | Discharge status: Home / Self Care | Payer: Medicaid Other | Source: Home / Self Care

## 2021-08-04 ENCOUNTER — Emergency Department (HOSPITAL_COMMUNITY)
Admission: EM | Admit: 2021-08-04 | Discharge: 2021-08-04 | Disposition: A | Discharge status: Home / Self Care | Payer: Medicaid Other | Source: Home / Self Care | Attending: Emergency Medicine | Admitting: Emergency Medicine

## 2021-08-04 ENCOUNTER — Other Ambulatory Visit: Payer: Self-pay

## 2021-08-04 DIAGNOSIS — F909 Attention-deficit hyperactivity disorder, unspecified type: Secondary | ICD-10-CM | POA: Insufficient documentation

## 2021-08-04 DIAGNOSIS — F1590 Other stimulant use, unspecified, uncomplicated: Secondary | ICD-10-CM | POA: Insufficient documentation

## 2021-08-04 DIAGNOSIS — F1490 Cocaine use, unspecified, uncomplicated: Secondary | ICD-10-CM | POA: Insufficient documentation

## 2021-08-04 DIAGNOSIS — F4310 Post-traumatic stress disorder, unspecified: Secondary | ICD-10-CM | POA: Insufficient documentation

## 2021-08-04 DIAGNOSIS — M25572 Pain in left ankle and joints of left foot: Secondary | ICD-10-CM | POA: Insufficient documentation

## 2021-08-04 DIAGNOSIS — F259 Schizoaffective disorder, unspecified: Secondary | ICD-10-CM | POA: Insufficient documentation

## 2021-08-04 DIAGNOSIS — M25571 Pain in right ankle and joints of right foot: Secondary | ICD-10-CM | POA: Insufficient documentation

## 2021-08-04 DIAGNOSIS — Z21 Asymptomatic human immunodeficiency virus [HIV] infection status: Secondary | ICD-10-CM | POA: Insufficient documentation

## 2021-08-04 DIAGNOSIS — M79672 Pain in left foot: Secondary | ICD-10-CM | POA: Insufficient documentation

## 2021-08-04 DIAGNOSIS — M79671 Pain in right foot: Secondary | ICD-10-CM | POA: Insufficient documentation

## 2021-08-04 DIAGNOSIS — R400 Somnolence: Secondary | ICD-10-CM | POA: Insufficient documentation

## 2021-08-04 DIAGNOSIS — F32A Depression, unspecified: Secondary | ICD-10-CM | POA: Insufficient documentation

## 2021-08-04 DIAGNOSIS — Z5900 Homelessness unspecified: Secondary | ICD-10-CM | POA: Insufficient documentation

## 2021-08-04 DIAGNOSIS — Z79899 Other long term (current) drug therapy: Secondary | ICD-10-CM

## 2021-08-04 DIAGNOSIS — R52 Pain, unspecified: Secondary | ICD-10-CM

## 2021-08-04 LAB — BASIC METABOLIC PANEL
Anion gap: 7 (ref 5–15)
BUN: 12 mg/dL (ref 6–20)
CO2: 22 mmol/L (ref 22–32)
Calcium: 8.6 mg/dL — ABNORMAL LOW (ref 8.9–10.3)
Chloride: 104 mmol/L (ref 98–111)
Creatinine, Ser: 0.99 mg/dL (ref 0.61–1.24)
GFR, Estimated: >60 mL/min (ref >60)
Glucose, Bld: 81 mg/dL (ref 70–99)
Potassium: 3.4 mmol/L — ABNORMAL LOW (ref 3.5–5.1)
Sodium: 133 mmol/L — ABNORMAL LOW (ref 135–145)

## 2021-08-04 LAB — CBC
HCT: 42.9 % (ref 39.0–52.0)
Hemoglobin: 13.9 g/dL (ref 13.0–17.0)
MCH: 26.3 pg (ref 26.0–34.0)
MCHC: 32.4 g/dL (ref 30.0–36.0)
MCV: 81.3 fL (ref 80.0–100.0)
Platelets: 268 10*3/uL (ref 150–400)
RBC: 5.28 MIL/uL (ref 4.22–5.81)
RDW: 15.9 % — ABNORMAL HIGH (ref 11.5–15.5)
WBC: 8.8 10*3/uL (ref 4.0–10.5)
nRBC: 0 % (ref 0.0–0.2)

## 2021-08-04 LAB — RAPID URINE DRUG SCREEN, HOSP PERFORMED
Amphetamines: POSITIVE — AB
Barbiturates: NOT DETECTED
Benzodiazepines: NOT DETECTED
Cocaine: POSITIVE — AB
Opiates: NOT DETECTED
Tetrahydrocannabinol: NOT DETECTED

## 2021-08-04 LAB — TROPONIN I (HIGH SENSITIVITY): Troponin I (High Sensitivity): 3 ng/L (ref <18)

## 2021-08-04 MED ORDER — IBUPROFEN 600 MG PO TABS
600.0000 mg | ORAL_TABLET | Freq: Once | ORAL | Status: AC
  Administered 2021-08-04: 600 mg via ORAL
  Filled 2021-08-04: qty 1

## 2021-08-04 MED ORDER — ACETAMINOPHEN 325 MG PO TABS
650.0000 mg | ORAL_TABLET | Freq: Once | ORAL | Status: AC
  Administered 2021-08-04: 650 mg via ORAL
  Filled 2021-08-04: qty 2

## 2021-08-04 NOTE — Discharge Instructions (Signed)
Continue Tylenol or ibuprofen as needed for management of pain.  Refer to the attached resource guide for list of shelters in the area.

## 2021-08-04 NOTE — ED Provider Notes (Signed)
Blake Chambers   CSN: [id number] Arrival date & time: 08/04/21  2313     History  Chief Complaint  Patient presents with   Foot Pain    Blake Chambers is a 31 y.o. male.   31 year old male with a history of anxiety, HIV, bipolar affective disorder presents to the emergency department for evaluation of bilateral foot pain.  He was seen earlier today for this complaint.  Also noted foot pain when at behavioral health 2 hours ago.  Was given ibuprofen just after 2200.  Upon discharge from [facility] called the ambulance for transport back to the ED for ongoing foot pain complaints.  States that he has been walking a lot.  He is homeless.  Well-known to the ED.  The history is provided by the patient. No language interpreter was used.      Home Medications Prior to Admission medications   Medication Sig Start Date End Date Taking? Authorizing Provider  ibuprofen (ADVIL) 600 MG tablet Take 1 tablet (600 mg total) by mouth every 6 (six) hours as needed. 06/30/21   Gwyneth Sprout, MD      Allergies    Tenofovir disoproxil    Review of Systems   Review of Systems Ten systems reviewed and are negative for acute change, except as noted in the HPI.    Physical Exam Updated Vital Signs There were no vitals taken for this visit.  Physical Exam Vitals and nursing Chambers reviewed.  Constitutional:      General: He is not in acute distress.    Appearance: He is well-developed. He is not diaphoretic.     Comments: Disheveled appearing and in NAD. Intent on sleeping and unwilling to contribute to history or exam.  HENT:     Head: Normocephalic and atraumatic.  Eyes:     General: No scleral icterus.    Conjunctiva/sclera: Conjunctivae normal.  Cardiovascular:     Rate and Rhythm: Normal rate and regular rhythm.     Pulses: Normal pulses.     Comments: DP pulse 2+ b/l Pulmonary:     Effort: Pulmonary effort is normal. No respiratory  distress.     Comments: Respirations even and unlabored Musculoskeletal:        General: Normal range of motion.     Cervical back: Normal range of motion.     Comments: Callused feet with soft compartments bilaterally.  No erythema, heat to touch, induration or fluctuance on examination of b/l feet.  Skin:    General: Skin is warm and dry.     Coloration: Skin is not pale.     Findings: No erythema or rash.  Neurological:     Mental Status: He is alert and oriented to person, place, and time.     Coordination: Coordination normal.  Psychiatric:        Behavior: Behavior normal.    ED Results / Procedures / Treatments   Labs (all labs ordered are listed, but only abnormal results are displayed) Labs Reviewed - No data to display  EKG None  Radiology DG Chest 2 View  Result Date: 08/03/2021 CLINICAL DATA:  Chest pain. EXAM: CHEST - 2 VIEW COMPARISON:  Chest and rib radiographs 8 days ago 07/26/2021 FINDINGS: The cardiomediastinal contours are normal. The lungs are clear. Pulmonary vasculature is normal. No consolidation, pleural effusion, or pneumothorax. No acute osseous abnormalities are seen. IMPRESSION: Negative radiographs of the chest. Electronically Signed   By: Ivette Loyal.D.  On: 08/03/2021 23:45    Procedures Procedures    Medications Ordered in ED Medications - No data to display  ED Course/ Medical Decision Making/ A&P                           Medical Decision Making  This patient presents to the ED for concern of chronic b/l foot pain, this involves an extensive number of treatment options, and is a complaint that carries with it a high risk of complications and morbidity.  The differential diagnosis includes plantar fasciitis vs cellulitis vs sprain/strain vs fracture.   Co morbidities that complicate the patient evaluation  HIV Bipolar affective d/o   Additional history obtained:  Additional history obtained from EMS personnel External  records from outside source obtained and reviewed including prior [place] visit earlier tonight. Prior Xray imaging of feet reviewed which have been without acute osseous abnormality or signs of osteomyelitis.    Reevaluation:  After the interventions noted above, I reevaluated the patient and found that they have : remained stable   Social Determinants of Health:  Homelessness HIV   Dispostion:  Patient neurovascularly intact on exam. No hx of acute trauma or fall.  No swelling, erythema, heat to touch to the affected area; no concern for septic joint. Compartments in the affected extremity are soft.   After consideration of the diagnostic results and the patients response to treatment, I feel that the patent would benefit from outpatient follow up and ongoing use of OTC analgesics.  Patient given list of local area shelters in light of his homelessness.  This is his 12th ED visit in the past month.  Return precautions discussed and provided. Patient discharged, departing ED in stable condition.         Final Clinical Impression(s) / ED Diagnoses Final diagnoses:  Pain in both feet    Rx / DC Orders ED Discharge Orders     None         Antonietta Breach, PA-C 08/04/21 2336    Orpah Greek, MD 08/05/21 [phone]

## 2021-08-04 NOTE — ED Triage Notes (Addendum)
Patient presents for foot pain due to walking a lot. Patient d/c'd at 2215 from [facility] and d/c'd from this department this morning. Patient sleeping at this time.

## 2021-08-04 NOTE — ED Notes (Signed)
Reviewed discharge instructions with patient. Follow-up care reviewed. Patient verbalized understanding. Patient A&Ox4, VSS, and ambulatory with steady gait upon discharge.  
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
## 2021-08-04 NOTE — Discharge Instructions (Signed)

## 2021-08-04 NOTE — ED Notes (Signed)
Open knife found on pt person. Security has pt knife.
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
## 2021-08-04 NOTE — ED Notes (Signed)
Pt sleeping. 

## 2021-08-04 NOTE — ED Notes (Signed)
Knife returned to patient.

## 2021-08-04 NOTE — ED Provider Notes (Signed)
[facility]-EMERGENCY DEPT Provider Note   CSN: [id number] Arrival date & time: 08/03/21  2002     History Chief Complaint  Patient presents with   Generalized Body Aches    Blake Chambers is a 31 y.o. male history of polysubstance use, anxiety, HIV, bipolar affective disorder, presents emergency department for evaluation after being found sleeping in [employer] [place].  He was found to be lethargic with the police who arrived at the scene and called EMS and was transferred here.  He is well-known to the emergency room with over 23 visits in the past 6 months.  The patient is somnolent, but arouses.  He reports that he does not know why he is here and then when I asked him the same time, he reports he was having some chest pain and shortness of breath.  He is asked me to allow him to sleep and he does not want answer any more questions.  Denies any drug use, but have history of polysubstance use and his prior charts.    HPI     Home Medications Prior to Admission medications   Medication Sig Start Date End Date Taking? Authorizing Provider  ibuprofen (ADVIL) 600 MG tablet Take 1 tablet (600 mg total) by mouth every 6 (six) hours as needed. 06/30/21   Gwyneth Sprout, MD      Allergies    Tenofovir disoproxil    Review of Systems   Review of Systems  Respiratory:  Positive for shortness of breath.   Cardiovascular:  Positive for chest pain.   Physical Exam Updated Vital Signs BP 119/81 (BP Location: Right Arm)    Pulse 88    Temp 97.7 F (36.5 C) (Oral)    Resp 20    SpO2 99%  Physical Exam Vitals and nursing note reviewed.  Constitutional:      General: He is not in acute distress.    Appearance: He is not toxic-appearing.     Comments: Disheveled.  Somnolent, but arousable.  Cardiovascular:     Rate and Rhythm: Normal rate and regular rhythm.     Pulses: Normal pulses.  Pulmonary:     Effort: Pulmonary effort is normal. No  respiratory distress.  Chest:     Chest wall: No tenderness.  Abdominal:     Tenderness: There is no abdominal tenderness. There is no guarding or rebound.  Musculoskeletal:        General: No swelling.     Right lower leg: No edema.     Left lower leg: No edema.    ED Results / Procedures / Treatments   Labs (all labs ordered are listed, but only abnormal results are displayed) Labs Reviewed  RAPID URINE DRUG SCREEN, HOSP PERFORMED  BASIC METABOLIC PANEL  CBC  TROPONIN I (HIGH SENSITIVITY)    EKG None  Radiology DG Chest 2 View  Result Date: 08/03/2021 CLINICAL DATA:  Chest pain. EXAM: CHEST - 2 VIEW COMPARISON:  Chest and rib radiographs 8 days ago 07/26/2021 FINDINGS: The cardiomediastinal contours are normal. The lungs are clear. Pulmonary vasculature is normal. No consolidation, pleural effusion, or pneumothorax. No acute osseous abnormalities are seen. IMPRESSION: Negative radiographs of the chest. Electronically Signed   By: Narda Rutherford M.D.   On: 08/03/2021 23:45    Procedures Procedures   Medications Ordered in ED Medications - No data to display  ED Course/ Medical Decision Making/ A&P  Medical Decision Making Amount and/or Complexity of Data Reviewed Labs: ordered. Radiology: ordered.   31 year old male experiencing homelessness presents emerged department for evaluation after being found sleeping in [facility].  Due to the patient's history of polysubstance use cannot rule out any ACS, or drug-induced ischemia.  Will order cardiac work-up.  Vital signs are stable.  Patient normotensive, afebrile, normal pulse rate, satting well on room air without any increased work of breathing.  The patient is not very cooperative my exam and asked me to leave him alone so that he can sleep.  EKG shows possible ectopic atrial rhythm although unchanged from previous EKG.  Labs and chest x-ray pending at this time.  Will handoff to  oncoming shift.  12:33 AM Care of Blake Chambers transferred to PA Arthor Captain at the end of my shift as the patient will require reassessment once labs/imaging have resulted. Patient presentation, ED course, and plan of care discussed with review of all pertinent labs and imaging. Please see his/her note for further details regarding further ED course and disposition. Plan at time of handoff is follow up on basic labs and troponins. . This may be altered or completely changed at the discretion of the oncoming team pending results of further workup.  Final Clinical Impression(s) / ED Diagnoses Final diagnoses:  None    Rx / DC Orders ED Discharge Orders     None         Achille Rich, Cordelia Poche 08/05/21 1555    Bethann Berkshire, MD 08/06/21 [phone]

## 2021-08-04 NOTE — ED Triage Notes (Addendum)
Homeless pt to triage via [facility].  Per EMS pt was just discharged from [facility] and walked to the gas station.  Reports bilateral leg and foot pain.  Asked pt if he told [facility] about his leg pain and he states he did but doesn't remember what they told him because he was sleeping.  Denies recent injury.

## 2021-08-04 NOTE — Discharge Instructions (Signed)
You are encouraged to follow up with [facility] for outpatient treatment. ? ?Walk in/ Open Access Hours: ?Monday - Friday 8AM - 11AM (To see provider and therapist) - Arrive around 7 or 7:15 to have a better chance of being seen, as slots fill up.   ?Friday - 1PM - 4PM (To see therapist only) ? ?[facility] ?[address] ?[place], [place] ?[phone] ? ? ?Discharge recommendations:  ?Patient is to take medications as prescribed. ?Please see information for follow-up appointment with psychiatry and therapy. ?Please follow up with your primary care provider for all medical related needs.  ? ?Therapy: We recommend that patient participate in individual therapy to address mental health concerns. ? ?Medications: The parent/guardian is to contact a medical professional and/or outpatient provider to address any new side effects that develop. Parent/guardian should update outpatient providers of any new medications and/or medication changes.  ? ?Atypical antipsychotics: If you are prescribed an atypical antipsychotic, it is recommended that your height, weight, BMI, blood pressure, fasting lipid panel, and fasting blood sugar be monitored by your outpatient providers. ? ?Safety:  ?The patient should abstain from use of illicit substances/drugs and abuse of any medications. ?If symptoms worsen or do not continue to improve or if the patient becomes actively suicidal or homicidal then it is recommended that the patient return to the closest hospital emergency department, the [facility], or call 911 for further evaluation and treatment. ?National Suicide Prevention Lifeline 1-800-SUICIDE or 1-800-273-8255. ? ?About 988 ?988 offers 24/7 access to trained crisis counselors who can help people experiencing mental health-related distress. People can call or text 988 or chat 988lifeline.org for themselves or if they are worried about a loved one who may need  crisis support. ? ?

## 2021-08-04 NOTE — ED Provider Notes (Addendum)
Behavioral Health Urgent Care Medical Screening Exam  Patient Name: Blake Chambers MRN: [id number] Date of Evaluation: 08/04/21 Chief Complaint:   Diagnosis:  Final diagnoses:  Generalized pain  Medication management  Homelessness    History of Present illness: Blake Chambers is a 30 y.o. male with self reported psychiatric history of ADHD, depression, PTSD, and schizoaffective disorder.  Patient presented voluntarily to [facility].  Patient presented with chief complaint of "I was referred here a couple of weeks ago; I need a mental health doctor to get me back on my meds."  Patient was seen face-to-face and his chart was reviewed by this provider.  Patient is alert and oriented x4.  Patient is drowsy and cooperative.  Patient repeated fell asleep and had to be woken up several times during this assessment. Patient's mood is euthymic with congruent affect.  Patient did not appear to be responding to any internal/external stimuli or experiencing any delusional thought content during this assessment assessment.  Patient reports that he is homeless and has been sleeping on concret. He reports that his body is aching; he rates his body ache 7/10. He is also reporting bilateral feet pain due to walking from ED to [facility]. He rates feet pain at 10/10.   Patient reports that he has been off of his psychiatric medications for " a couple of years, maybe 4 years."  He doesn't know the name of the medications that he was previously prescribed. He says that he does not have outpatient psychiatric provider or therapist. He says he is not experiencing any acute psychiatric symptoms but would like to establish care with a psychiatric provider and a therapist. He denies SI/HI/paranoia/hallucination. He denies substance abuse with this Probation officer however he informed TTS counselor that he used cocaine and methamphetamine a few weeks ago.      Psychiatric Specialty Exam  Presentation  General Appearance:Appropriate  for Environment  Eye Contact:Minimal  Speech:Clear and Coherent  Speech Volume:Normal  Handedness:Right   Mood and Affect  Mood:Euthymic  Affect:Congruent   Thought Process  Thought Processes:Coherent  Descriptions of Associations:Intact  Orientation:Full (Time, Place and Person)  Thought Content:WDL    Hallucinations:None  Ideas of Reference:None  Suicidal Thoughts:No  Homicidal Thoughts:No   Sensorium  Memory:Immediate Good; Remote Good; Recent Good  Judgment:Fair  Insight:Fair   Executive Functions  Concentration:Fair  Attention Span:Fair  [place] of Knowledge:Good  Language:Good   Psychomotor Activity  Psychomotor Activity:Normal   Assets  Assets:Desire for Improvement; Communication Skills   Sleep  Sleep:Poor  Number of hours: 3   No data recorded  Physical Exam: Physical Exam Vitals and nursing note reviewed.  Constitutional:      General: He is not in acute distress.    Appearance: He is well-developed.  HENT:     Head: Normocephalic and atraumatic.  Eyes:     Conjunctiva/sclera: Conjunctivae normal.  Cardiovascular:     Rate and Rhythm: Normal rate and regular rhythm.     Heart sounds: No murmur heard. Pulmonary:     Effort: Pulmonary effort is normal. No respiratory distress.     Breath sounds: Normal breath sounds.  Abdominal:     Palpations: Abdomen is soft.     Tenderness: There is no abdominal tenderness.  Musculoskeletal:        General: No swelling.     Cervical back: Neck supple.  Skin:    General: Skin is warm and dry.     Capillary Refill: Capillary refill takes less than 2 seconds.  Neurological:     Mental Status: He is alert and oriented to person, place, and time.  Psychiatric:        Attention and Perception: Attention and perception normal.        Mood and Affect: Mood normal.        Speech: Speech normal.        Behavior: Behavior normal. Behavior is cooperative.        Thought  Content: Thought content normal.        Cognition and Memory: Cognition normal.   Review of Systems  Constitutional: Negative.   HENT: Negative.    Eyes: Negative.   Respiratory: Negative.    Cardiovascular: Negative.   Gastrointestinal: Negative.   Genitourinary: Negative.   Musculoskeletal: Negative.   Skin: Negative.   Neurological: Negative.   Endo/Heme/Allergies: Negative.   Psychiatric/Behavioral: Negative.    Blood pressure 105/78, pulse (!) 106, temperature 98.3 F (36.8 C), temperature source Oral, resp. rate 18, SpO2 100 %. There is no height or weight on file to calculate BMI.  Musculoskeletal: Strength & Muscle Tone: within normal limits Gait & Station: normal Patient leans: Right   [place] [facility] Discharge Disposition for Follow up and Recommendations: Based on my evaluation the patient does not appear to have an emergency medical condition and can be discharged with resources and follow up care in outpatient services for Medication Management and Individual Therapy Per chart review, patient was given Tylenol for pain while in the ED. Will give him Ibuprofen 600mg  X1 for general body pain.   Patient was given outpatient resources for psychiatric providers as well as information for Open Access here at [facility].   No evidence of imminent danger to self or others at this time. Patient does not meet criteria for psychiatric admission or IVC. Supportive therapy provided about ongoing stressors. Discussed crisis plan, callling 911/988 or going to Emergency Dept    Ophelia Shoulder, NP 08/04/2021, 10:09 PM

## 2021-08-04 NOTE — ED Provider Notes (Signed)
[name] EMERGENCY DEPARTMENT Provider Note   CSN: [id number] Arrival date & time: 08/04/21  1354     History Chief Complaint  Patient presents with   Leg Pain    Blake Chambers is a 31 y.o. male experiencing homeslessness presents to the ED for eval of bilateral feet pain.  The patient is not cooperative with my exam and is not wanting to answer my questions and is repeatedly telling me to go away and to "fuck off so that (he) can sleep". To triage he complained of bilateral feet pain. On my exam, he reports he just wanted a place to sleep, but his feet have been hurting. He will not tell me for how long. This patient is well known to the emergency department and was seen yesterday at [facility] by myself for chest pain. He has had 24 visits in the past 6 months.     Leg Pain     Home Medications Prior to Admission medications   Medication Sig Start Date End Date Taking? Authorizing Provider  ibuprofen (ADVIL) 600 MG tablet Take 1 tablet (600 mg total) by mouth every 6 (six) hours as needed. 06/30/21   Gwyneth Sprout, MD      Allergies    Tenofovir disoproxil    Review of Systems   Review of Systems  Musculoskeletal:  Positive for myalgias.   Physical Exam Updated Vital Signs BP 114/66 (BP Location: Left Arm)    Pulse (!) 109    Temp 98.7 F (37.1 C) (Oral)    Resp 18    SpO2 100%  Physical Exam Vitals and nursing note reviewed.  Constitutional:      General: He is not in acute distress.    Appearance: He is not toxic-appearing.     Comments: Sleeping, but arousable. The patient is not cooperative with my exam because he reports he wants to sleep.  Musculoskeletal:     Comments: Thick callus on bilateral soles of feet. No overlying erythema or warmth. Cap refill < 2 seconds. Compartments soft. Palpable DP and PT pulses bilaterally. No wounds seen.  Neurological:     Sensory: No sensory deficit.     Comments: Moving all extremities  spontaneously. Sensation intact.     ED Results / Procedures / Treatments   Labs (all labs ordered are listed, but only abnormal results are displayed) Labs Reviewed - No data to display  EKG None  Radiology DG Chest 2 View  Result Date: 08/03/2021 CLINICAL DATA:  Chest pain. EXAM: CHEST - 2 VIEW COMPARISON:  Chest and rib radiographs 8 days ago 07/26/2021 FINDINGS: The cardiomediastinal contours are normal. The lungs are clear. Pulmonary vasculature is normal. No consolidation, pleural effusion, or pneumothorax. No acute osseous abnormalities are seen. IMPRESSION: Negative radiographs of the chest. Electronically Signed   By: Narda Rutherford M.D.   On: 08/03/2021 23:45    Procedures Procedures   Medications Ordered in ED Medications  acetaminophen (TYLENOL) tablet 650 mg (has no administration in time range)    ED Course/ Medical Decision Making/ A&P                           Medical Decision Making Risk OTC drugs.   31 year old male experiencing homelessness presents emergency department for evaluation of bilateral feet pain and for sleep. The patient is well known to the ER with 24 visits in the past 6 months with the last visit being less than  24 hours ago for chest pain at [facility]. The patient had a normal workup and tested positive for cocaine and methamphetamines. The patient was discharged from [facility], walked to a gas station, and called 911 to be taken back to the ER.    The patient is not cooperative with my exam and tells me to go away and to "fuck off so that (he) can sleep".  I informed the patient that I will need to perform an exam on him to find a cause of his foot pain.  He rolled away from event and continued to snore.  The patient is somnolent but easily arousable.  He was able to remove his socks for me.  I examined his feet.  Palpable DP and PT pulses bilaterally.  Compartments are soft.  There is no focal tenderness to the foot.  I do not appreciate any  wounds.  There is thickened calluses to the bilateral soles most likely due to his homelessness.  I do not appreciate any deformities.  No overlying erythema or warmth.  At this time, I doubt any septic joint or any gout.  The patient had a CBC done yesterday that did not show any leukocytosis.  Low suspicion for any fracture or dislocation as the feet do not have any obvious deformities and there is no focal tenderness.  No swelling noted.  I doubt any rhabdomyolysis as the patient just has bilateral foot pain and is not experiencing pain anywhere else.  I do not appreciate any swelling noted as well.   At this time, I do not think any imaging needed given no trauma to the area, the patient is ambulatory, and does not have any focal tenderness.  I ordered him Tylenol which he refused.   I suspect his foot pain is from his homelessness and walking.  The patient was looking for somewhere to sleep.  I consulted TOC who informed me that they have given him resources multiple times for homeless shelters and he is not compliant.  At this time the patient is stable for discharge.  We will give him new socks to help aid in his foot pain.  Recommended that he keep his feet elevated as possible.   Final Clinical Impression(s) / ED Diagnoses Final diagnoses:  Homelessness  Bilateral foot pain    Rx / DC Orders ED Discharge Orders     None         Achille Rich, Cordelia Poche 08/04/21 1839    Terald Sleeper, MD 08/04/21 2025
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
## 2021-08-09 ENCOUNTER — Emergency Department (HOSPITAL_COMMUNITY)
Admission: EM | Admit: 2021-08-09 | Discharge: 2021-08-09 | Disposition: A | Discharge status: Home / Self Care | Payer: Medicaid Other | Attending: Emergency Medicine | Admitting: Emergency Medicine

## 2021-08-09 DIAGNOSIS — R0683 Snoring: Secondary | ICD-10-CM | POA: Insufficient documentation

## 2021-08-09 DIAGNOSIS — Z5900 Homelessness unspecified: Secondary | ICD-10-CM | POA: Insufficient documentation

## 2021-08-09 DIAGNOSIS — R739 Hyperglycemia, unspecified: Secondary | ICD-10-CM | POA: Diagnosis not present

## 2021-08-09 DIAGNOSIS — Z21 Asymptomatic human immunodeficiency virus [HIV] infection status: Secondary | ICD-10-CM | POA: Insufficient documentation

## 2021-08-09 DIAGNOSIS — M7918 Myalgia, other site: Secondary | ICD-10-CM | POA: Diagnosis present

## 2021-08-09 LAB — CBG MONITORING, ED: Glucose-Capillary: 102 mg/dL — ABNORMAL HIGH (ref 70–99)

## 2021-08-09 NOTE — ED Triage Notes (Signed)
Patient arrives via EMS. Does not really verbalize any sort of complaint. Patient laid down on stretcher and immediately began sleeping.

## 2021-08-09 NOTE — ED Provider Notes (Signed)
[facility] EMERGENCY DEPARTMENT Provider Note   CSN: [id number] Arrival date & time: 08/09/21  0548     History  Chief Complaint  Patient presents with   Generalized Body Aches    Nyheem Binette is a 31 y.o. male.  HPI  Patient with history of bipolar 1 disorder, HIV not on antiviral medicine, syphilis, substance use disorder involving cocaine and marijuana, unstable housing presents to the ED via EMS.  EMS reported he was having generalized body aches.  On my examination he states he feels cold outside.  When trying to get him to elaborate any other symptoms he says to leave him alone as he "just wants to sleep".  Patient is well-known to the ED.  This is his ninth visit to the the emergency department/urgent care in the month of January.  Home Medications Prior to Admission medications   Medication Sig Start Date End Date Taking? Authorizing Provider  ibuprofen (ADVIL) 600 MG tablet Take 1 tablet (600 mg total) by mouth every 6 (six) hours as needed. 06/30/21   Gwyneth Sprout, MD      Allergies    Tenofovir disoproxil    Review of Systems   Review of Systems  Physical Exam Updated Vital Signs BP (!) 151/87    Pulse 79    Temp 97.9 F (36.6 C)    Resp 16    SpO2 97%  Physical Exam Vitals and nursing note reviewed. Exam conducted with a chaperone present.  Constitutional:      General: He is not in acute distress.    Appearance: Normal appearance.     Comments: Patient snoring upon entry to the room, arousable.  HENT:     Head: Normocephalic and atraumatic.  Eyes:     General: No scleral icterus.    Extraocular Movements: Extraocular movements intact.     Pupils: Pupils are equal, round, and reactive to light.  Cardiovascular:     Rate and Rhythm: Normal rate and regular rhythm.  Pulmonary:     Effort: Pulmonary effort is normal.     Breath sounds: Normal breath sounds.  Skin:    Coloration: Skin is not jaundiced.  Neurological:      Mental Status: He is alert. Mental status is at baseline.     Coordination: Coordination normal.    ED Results / Procedures / Treatments   Labs (all labs ordered are listed, but only abnormal results are displayed) Labs Reviewed  CBG MONITORING, ED - Abnormal; Notable for the following components:      Result Value   Glucose-Capillary 102 (*)    All other components within normal limits  CBG MONITORING, ED    EKG None  Radiology No results found.  Procedures Procedures    Medications Ordered in ED Medications - No data to display  ED Course/ Medical Decision Making/ A&P                           Medical Decision Making  This is a 31 year old male presenting via EMS with vague complaints.  He is very well-known to this department.    Unfortunately, patient's care is negatively affected by social determinants of health.  Specifically patient suffers from unstable housing. I suspect this is likely the reason for frequent ED visits.  On evaluation patient is tired but not ill appearing. Does not appear altered.  CBG checked to evaluate for possible hypoglycemia, unremarkable at 102. He has no  complaints, just expressing a desire to be left alone to sleep.    Do not feel patient needs any additional work-up at this time.  Will discharge with resources        Final Clinical Impression(s) / ED Diagnoses Final diagnoses:  None    Rx / DC Orders ED Discharge Orders     None         Theron Arista, PA-C 08/09/21 0703    Melene Plan, DO 08/09/21 [phone]

## 2021-08-09 NOTE — Discharge Instructions (Signed)
Resources provided above.

## 2021-08-12 ENCOUNTER — Other Ambulatory Visit: Payer: Self-pay

## 2021-08-12 ENCOUNTER — Other Ambulatory Visit: Payer: Medicaid Other

## 2021-08-12 DIAGNOSIS — B20 Human immunodeficiency virus [HIV] disease: Secondary | ICD-10-CM

## 2021-08-12 DIAGNOSIS — Z113 Encounter for screening for infections with a predominantly sexual mode of transmission: Secondary | ICD-10-CM

## 2021-08-14 ENCOUNTER — Ambulatory Visit (INDEPENDENT_AMBULATORY_CARE_PROVIDER_SITE_OTHER): Payer: Medicaid Other | Admitting: Pharmacist

## 2021-08-14 ENCOUNTER — Other Ambulatory Visit (HOSPITAL_COMMUNITY): Payer: Self-pay

## 2021-08-14 ENCOUNTER — Other Ambulatory Visit: Payer: Self-pay

## 2021-08-14 VITALS — Wt 156.0 lb

## 2021-08-14 DIAGNOSIS — B20 Human immunodeficiency virus [HIV] disease: Secondary | ICD-10-CM
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
## 2021-08-15 ENCOUNTER — Emergency Department (HOSPITAL_COMMUNITY)
Admission: EM | Admit: 2021-08-15 | Discharge: 2021-08-15 | Disposition: A | Discharge status: Home / Self Care | Payer: Medicaid Other | Attending: Emergency Medicine | Admitting: Emergency Medicine

## 2021-08-15 ENCOUNTER — Encounter (HOSPITAL_COMMUNITY): Payer: Self-pay

## 2021-08-15 ENCOUNTER — Other Ambulatory Visit: Payer: Self-pay

## 2021-08-15 DIAGNOSIS — R1110 Vomiting, unspecified: Secondary | ICD-10-CM | POA: Insufficient documentation

## 2021-08-15 DIAGNOSIS — Z5321 Procedure and treatment not carried out due to patient leaving prior to being seen by health care provider: Secondary | ICD-10-CM | POA: Diagnosis not present

## 2021-08-15 NOTE — ED Provider Triage Note (Signed)
Emergency Medicine Provider Triage Evaluation Note ? ?Blake Chambers , a 31 y.o. male  was evaluated in triage.  Pt complains of vomiting PTA. Believes he ate something "poisonous". Transported to the ED via EMS from the laundromat. No medications PTA. Seen in the ED 28 times in the past 6 months. ? ?Review of Systems  ?Positive: As above ?Negative: As above ? ?Physical Exam  ?BP 125/86   Pulse 95   Temp 98.7 ?F (37.1 ?C)   Resp 18   Ht 5\' 9"  (1.753 m)   Wt 70.8 kg   SpO2 100%   BMI 23.04 kg/m?  ?Gen:   Awake, no distress   ?Resp:  Normal effort  ?MSK:   Moves extremities without difficulty  ?Other:  Intent on sleeping when not verbally stimulated; covered in blankets ? ?Medical Decision Making  ?Medically screening exam initiated at 3:25 AM.  Appropriate orders placed.  Blake Chambers was informed that the remainder of the evaluation will be completed by another provider, this initial triage assessment does not replace that evaluation, and the importance of remaining in the ED until their evaluation is complete. ? ?Vomiting - none exhibited since arrival. Homeless. ?  Blake Diener, PA-C ?08/15/21 0327 ? ?

## 2021-08-15 NOTE — ED Triage Notes (Signed)
Arrives [facility] from laundry mat after several episodes of vomiting.  ?

## 2021-08-26 ENCOUNTER — Ambulatory Visit: Payer: Medicaid Other | Admitting: Infectious Diseases

## 2021-08-28 LAB — HIV-1 INTEGRASE GENOTYPE

## 2021-08-28 LAB — HIV RNA, RTPCR W/R GT (RTI, PI,INT)
HIV 1 RNA Quant: 18300 copies/mL — ABNORMAL HIGH
HIV-1 RNA Quant, Log: 4.26 Log copies/mL — ABNORMAL HIGH

## 2021-08-28 LAB — HIV-1 GENOTYPE: HIV-1 Genotype: DETECTED — AB

## 2021-08-29 ENCOUNTER — Emergency Department (HOSPITAL_COMMUNITY)
Admission: EM | Admit: 2021-08-29 | Discharge: 2021-08-29 | Disposition: A | Discharge status: Home / Self Care | Payer: Medicaid Other | Attending: Emergency Medicine | Admitting: Emergency Medicine

## 2021-08-29 ENCOUNTER — Other Ambulatory Visit: Payer: Self-pay

## 2021-08-29 DIAGNOSIS — M79605 Pain in left leg: Secondary | ICD-10-CM | POA: Diagnosis not present

## 2021-08-29 DIAGNOSIS — M79604 Pain in right leg: Secondary | ICD-10-CM | POA: Diagnosis not present

## 2021-08-29 LAB — CK: Total CK: 593 U/L — ABNORMAL HIGH (ref 49–397)

## 2021-08-29 MED ORDER — ACETAMINOPHEN 325 MG PO TABS
650.0000 mg | ORAL_TABLET | Freq: Once | ORAL | Status: AC
  Administered 2021-08-29: 650 mg via ORAL
  Filled 2021-08-29: qty 2

## 2021-08-29 NOTE — Discharge Instructions (Addendum)
Please continue with all medications, it is noted that your HIV is detectable, please take your medications follow-up with your infectious disease doctor. ? ?Given you resources for shelters within the area ? ?Come back to the emergency department if you develop chest pain, shortness of breath, severe abdominal pain, uncontrolled nausea, vomiting, diarrhea. ? ?

## 2021-08-29 NOTE — ED Triage Notes (Signed)
Pt arrives POV c/o leg and back pain. Pt states his legs and back hurt from walking too much. ?

## 2021-08-29 NOTE — ED Provider Notes (Addendum)
?[facility] ?Provider Note ? ? ?CSN: [id number] ?Arrival date & time: 08/29/21  0155 ? ?  ? ?History ? ?Chief Complaint  ?Patient presents with  ? Leg Pain  ? ? ?Blake Chambers is a 31 y.o. male. ? ?HPI ? ?Patient with medical history including anxiety, HIV, bipolar presents with complaints of bilateral leg pain.  Patient's leg pain started today about 4 hours ago states he feels pain all throughout his legs and believes it  is from continuous walking, states he is homeless and walks around a lot.  He denies any recent trauma to the area, no leg swelling, no history of PEs or DVTs, currently on hormone therapy, denies any chest pain shortness of breath, he does not endorse any dark-colored urine or generalized weakness.  Pain remains in his legs does not radiate worse with move improved with rest.  he has not tried any over-the-counter pain medications, he is never had this in the past. ? ?I reviewed patient's chart has been seen multiple times for multitude of reasons but is mainly involves homelessness or pain in feet from ambulation.  Most recent was 02/24.  Patient was also also had recent visit at the HIV clinic which shows he has slight elevation in his HIV 1 RNA quant as well as H IV 1 is at a detectable level. ? ?Home Medications ?Prior to Admission medications   ?Medication Sig Start Date End Date Taking? Authorizing Provider  ?ibuprofen (ADVIL) 600 MG tablet Take 1 tablet (600 mg total) by mouth every 6 (six) hours as needed. 06/30/21   Blanchie Dessert, MD  ?   ? ?Allergies    ?Tenofovir disoproxil   ? ?Review of Systems   ?Review of Systems  ?Constitutional:  Negative for chills and fever.  ?Respiratory:  Negative for shortness of breath.   ?Cardiovascular:  Negative for chest pain.  ?Gastrointestinal:  Negative for abdominal pain.  ?Musculoskeletal:   ?     Bilateral leg pain  ?Neurological:  Negative for headaches.  ? ?Physical Exam ?Updated Vital Signs ?BP (!)  131/99   Pulse 60   Temp 99.3 ?F (37.4 ?C) (Oral)   Resp 16   SpO2 95%  ?Physical Exam ?Vitals and nursing note reviewed.  ?Constitutional:   ?   General: He is not in acute distress. ?   Appearance: He is not ill-appearing.  ?HENT:  ?   Head: Normocephalic and atraumatic.  ?   Nose: No congestion.  ?Eyes:  ?   Conjunctiva/sclera: Conjunctivae normal.  ?Cardiovascular:  ?   Rate and Rhythm: Normal rate and regular rhythm.  ?   Pulses: Normal pulses.  ?   Heart sounds: No murmur heard. ?  No friction rub. No gallop.  ?Pulmonary:  ?   Effort: No respiratory distress.  ?   Breath sounds: No wheezing, rhonchi or rales.  ?Musculoskeletal:  ?   Comments: Spine was visualized there is no overlying skin changes,  nontender to palpation, no step-off or deformities present.  He does have tenderness within the musculature within the lower lumbar region. ? ?Legs were visualized no unilateral leg swelling no overlying skin changes, patient has pain all over his legs,   nonpinpoint or focalized, there is no palpable cords,  good dorsal pedal pulses bilaterally neurovascular fully intact.   ?Skin: ?   General: Skin is warm and dry.  ?Neurological:  ?   Mental Status: He is alert.  ?Psychiatric:     ?  Mood and Affect: Mood normal.  ? ? ?ED Results / Procedures / Treatments   ?Labs ?(all labs ordered are listed, but only abnormal results are displayed) ?Labs Reviewed  ?CK - Abnormal; Notable for the following components:  ?    Result Value  ? Total CK 593 (*)   ? All other components within normal limits  ? ? ?EKG ?None ? ?Radiology ?No results found. ? ?Procedures ?Procedures  ? ? ?Medications Ordered in ED ?Medications  ?acetaminophen (TYLENOL) tablet 650 mg (650 mg Oral Given 08/29/21 0402)  ? ? ?ED Course/ Medical Decision Making/ A&P ?  ?                        ?Medical Decision Making ?Amount and/or Complexity of Data Reviewed ?Labs: ordered. ? ?Risk ?OTC drugs. ? ? ?This patient presents to the ED for concern of  bilateral leg pain back pain, this involves an extensive number of treatment options, and is a complaint that carries with it a high risk of complications and morbidity.  The differential diagnosis includes fractures, dislocations, spinal cord, DVT ? ? ? ?Additional history obtained: ? ?Electronic medical record please see HPI for further detail ? ?Co morbidities that complicate the patient evaluation ? ?HIV ? ?Social Determinants of Health: ? ?Homelessness ? ? ? ?Lab Tests: ? ?I Ordered, and personally interpreted labs.  The pertinent results include: N/A ? ? ?Imaging Studies ordered: ? ?I ordered imaging studies including N/A ?I independently visualized and interpreted imaging which showed N/A ?I agree with the radiologist interpretation ? ? ?Cardiac Monitoring: ? ?The patient was maintained on a cardiac monitor.  I personally viewed and interpreted the cardiac monitored which showed an underlying rhythm of: N/A ? ? ?Medicines ordered and prescription drug management: ? ?I ordered medication including Tylenol for pain control ?I have reviewed the patients home medicines and have made adjustments as needed ? ?Test Considered: ? ?Considered x-ray of the spine but will defer as a very low suspicion for fracture dislocation as there is no traumatic injury associated with this pain, he is not increased risk for pathological fractures.  Pain is mainly within the musculature. ? ? ? ?Rule out ?I have low suspicion for spinal fracture or spinal cord abnormality as patient denies urinary incontinency, retention, difficulty with bowel movements, denies saddle paresthesias.  Spine was palpated there is no step-off, crepitus or gross deformities felt, patient had 5/5 strength, full range of motion, neurovascular fully intact in the lower extremities . Low suspicion for septic arthritis as patient denies IV drug use, skin exam was performed no erythematous, edema or warm joints noted.  I have low suspicion for DVT as he has no  unilateral leg swelling, patient's pain is all over his legs  non focalized there is no palpable cords,  atypical for patient develop bilateral simultaneous DVTs.  I have low suspicion for fracture dislocation of the lower legs there is no gross deformities present no traumatic injury associate with this pain.  I have low suspicion for rhabdo as pain is only localized in his legs, he is not endorsing weakness, 5 5 strength of the lower extremities, or dark urine, CK is also not significantly elevated.. ? ? ? ? ?Dispostion and problem list ? ?After consideration of the diagnostic results and the patients response to treatment, I feel that the patent would benefit from discharge. ? ?Leg and back pain-likely muscular from ambulation, will recommend over-the-counter pain medications, follow-up with  PCP for further evaluation.  Will give resources for services within the area. ? ? ? ? ? ? ? ? ? ? ? ?Final Clinical Impression(s) / ED Diagnoses ?Final diagnoses:  ?Bilateral leg pain  ? ? ?Rx / DC Orders ?ED Discharge Orders   ? ? None  ? ?  ? ? ?  ?Marcello Fennel, PA-C ?08/29/21 [id number] ? ?  ?Marcello Fennel, PA-C ?08/29/21 0501 ? ?  ?Marcello Fennel, PA-C ?08/29/21 0606 ? ?  ?Delora Fuel, MD ?[id number] ? ?

## 2021-08-30 ENCOUNTER — Emergency Department (HOSPITAL_COMMUNITY)
Admission: EM | Admit: 2021-08-30 | Discharge: 2021-08-30 | Disposition: A | Discharge status: Home / Self Care | Payer: Medicaid Other | Attending: Emergency Medicine | Admitting: Emergency Medicine

## 2021-08-30 DIAGNOSIS — L84 Corns and callosities: Secondary | ICD-10-CM | POA: Insufficient documentation

## 2021-08-30 DIAGNOSIS — M79641 Pain in right hand: Secondary | ICD-10-CM | POA: Diagnosis present

## 2021-08-30 DIAGNOSIS — M79642 Pain in left hand: Secondary | ICD-10-CM | POA: Diagnosis not present

## 2021-08-30 NOTE — ED Provider Notes (Signed)
?[name] EMERGENCY DEPARTMENT ?Provider Note ? ? ?CSN: [id number] ?Arrival date & time: 08/30/21  0020 ? ?  ? ?History ? ?Chief Complaint  ?Patient presents with  ? Hand Pain  ? ? ?Blake Chambers is a 31 y.o. male. ? ?The history is provided by the patient and medical records.  ?Hand Pain ? ?31 year old male presenting to the ED with hand pain.  He was [facility] a museum.  States the cold weather made his finger "bust open".  He is currently homeless.  He was reportedly kicked out of another museum earlier today for trespassing. ? ?Home Medications ?Prior to Admission medications   ?Medication Sig Start Date End Date Taking? Authorizing Provider  ?ibuprofen (ADVIL) 600 MG tablet Take 1 tablet (600 mg total) by mouth every 6 (six) hours as needed. 06/30/21   Gwyneth Sprout, MD  ?   ? ?Allergies    ?Tenofovir disoproxil   ? ?Review of Systems   ?Review of Systems  ?Musculoskeletal:  Positive for arthralgias.  ?All other systems reviewed and are negative. ? ?Physical Exam ?Updated Vital Signs ?BP 104/69 (BP Location: Right Arm)   Pulse 81   Temp 98 ?F (36.7 ?C) (Oral)   Resp 20   SpO2 100%  ? ?Physical Exam ?Vitals and nursing note reviewed.  ?Constitutional:   ?   Appearance: He is well-developed.  ?HENT:  ?   Head: Normocephalic and atraumatic.  ?Eyes:  ?   Conjunctiva/sclera: Conjunctivae normal.  ?   Pupils: Pupils are equal, round, and reactive to light.  ?Cardiovascular:  ?   Rate and Rhythm: Normal rate and regular rhythm.  ?   Heart sounds: Normal heart sounds.  ?Pulmonary:  ?   Effort: Pulmonary effort is normal.  ?   Breath sounds: Normal breath sounds.  ?Abdominal:  ?   General: Bowel sounds are normal.  ?   Palpations: Abdomen is soft.  ?Musculoskeletal:     ?   General: Normal range of motion.  ?   Cervical back: Normal range of motion.  ?   Comments: Chronic calluses noted to the hands, no acute open wounds/lacerations, no skin discoloration, no signs of necrosis or  frostbite, normal cap refill  ?Skin: ?   General: Skin is warm and dry.  ?Neurological:  ?   Mental Status: He is alert and oriented to person, place, and time.  ? ? ?ED Results / Procedures / Treatments   ?Labs ?(all labs ordered are listed, but only abnormal results are displayed) ?Labs Reviewed - No data to display ? ?EKG ?None ? ?Radiology ?No results found. ? ?Procedures ?Procedures  ? ? ?Medications Ordered in ED ?Medications - No data to display ? ?ED Course/ Medical Decision Making/ A&P ?  ?                        ?Medical Decision Making ? ?31 year old male here with reported hand pain.  Currently homeless, frequent visits for similar.  Hands atraumatic, chronic calluses but no acute signs of infection.  No discoloration.  No signs of necrosis or frostbite.  Cap refill is normal.  Vitals are stable, no hypothermia.  Do not feel he needs further emergent work-up.  Stable for discharge. ? ?Final Clinical Impression(s) / ED Diagnoses ?Final diagnoses:  ?Pain in both hands  ? ? ?Rx / DC Orders ?ED Discharge Orders   ? ? None  ? ?  ? ? ?  ?  Garlon Hatchet, PA-C ?08/30/21 [phone] ? ?  ?Dione Booze, MD ?08/30/21 [phone] ? ?

## 2021-08-30 NOTE — ED Provider Triage Note (Signed)
Emergency Medicine Provider Triage Evaluation Note ? ?[place] Blas , a 31 y.o. male  was evaluated in triage.  Picked up from [employer].  Complains of finger pain-- states cold made finger "bust open".  Currently homeless. ? ?Review of Systems  ?Positive: Finger pain ?Negative: fever ? ?Physical Exam  ?BP 120/82 (BP Location: Right Arm)   Pulse 78   Temp 98 ?F (36.7 ?C)   Resp 19   SpO2 97%  ? ?Gen:   Awake, no distress   ?Resp:  Normal effort  ?MSK:   Moves extremities without difficulty  ?Other:  Chronic calluses noted to fingers, there is no signs of necrosis or ? ?Medical Decision Making  ?Medically screening exam initiated at 12:20 AM.  Appropriate orders placed.  Nyaire Amjad was informed that the remainder of the evaluation will be completed by another provider, this initial triage assessment does not replace that evaluation, and the importance of remaining in the ED until their evaluation is complete. ? ?Hand pain.  Chronic appearing calluses.  No acute signs of infection or frostbite. ?  ?Larene Pickett, PA-C ?08/30/21 0032 ? ?

## 2021-08-30 NOTE — ED Triage Notes (Signed)
[facility] with c/o pain to right index finger. States the old has made it burst open.  ?

## 2021-09-03 ENCOUNTER — Emergency Department (HOSPITAL_COMMUNITY)
Admission: EM | Admit: 2021-09-03 | Discharge: 2021-09-03 | Disposition: A | Discharge status: Home / Self Care | Payer: Medicaid Other | Attending: Psychiatry | Admitting: Psychiatry

## 2021-09-03 ENCOUNTER — Encounter (HOSPITAL_COMMUNITY): Payer: Self-pay | Admitting: *Deleted

## 2021-09-03 ENCOUNTER — Other Ambulatory Visit: Payer: Self-pay

## 2021-09-03 ENCOUNTER — Ambulatory Visit (HOSPITAL_COMMUNITY)
Admission: EM | Admit: 2021-09-03 | Discharge: 2021-09-03 | Disposition: A | Discharge status: Home / Self Care | Payer: Medicaid Other | Source: Home / Self Care

## 2021-09-03 DIAGNOSIS — Z5321 Procedure and treatment not carried out due to patient leaving prior to being seen by health care provider: Secondary | ICD-10-CM | POA: Insufficient documentation

## 2021-09-03 DIAGNOSIS — F319 Bipolar disorder, unspecified: Secondary | ICD-10-CM | POA: Insufficient documentation

## 2021-09-03 DIAGNOSIS — M79641 Pain in right hand: Secondary | ICD-10-CM | POA: Diagnosis not present

## 2021-09-03 DIAGNOSIS — Z760 Encounter for issue of repeat prescription: Secondary | ICD-10-CM | POA: Insufficient documentation

## 2021-09-03 DIAGNOSIS — M79642 Pain in left hand: Secondary | ICD-10-CM | POA: Diagnosis present

## 2021-09-03 NOTE — ED Provider Triage Note (Signed)
Emergency Medicine Provider Triage Evaluation Note ? ?Blake Chambers , a 31 y.o. male  was evaluated in triage.  Pt complains of bilateral hand pain.  Picked up from [employer].  States he feels cold. ? ?Asking to microwave food. ? ?Review of Systems  ?Positive: Hand pain ?Negative: fever ? ?Physical Exam  ?BP 118/76   Pulse 97   Temp 98.1 ?F (36.7 ?C)   Resp 16   SpO2 98%  ? ?Gen:   Awake, no distress   ?Resp:  Normal effort  ?MSK:   Moves extremities without difficulty  ?Other:  Hands cold but no signs of frostbite ? ?Medical Decision Making  ?Medically screening exam initiated at 5:58 AM.  Appropriate orders placed.  Blake Chambers was informed that the remainder of the evaluation will be completed by another provider, this initial triage assessment does not replace that evaluation, and the importance of remaining in the ED until their evaluation is complete. ? ?Bilateral hand pain. Seen frequently for same.  Homeless.  Hands cold but no signs of frostbite.   ?  ?Garlon Hatchet, PA-C ?09/03/21 5027 ? ?

## 2021-09-03 NOTE — ED Notes (Signed)
Pt called for room, no answer.

## 2021-09-03 NOTE — ED Provider Notes (Signed)
Behavioral Health Urgent Care Medical Screening Exam ? ?Patient Name: Blake Chambers ?MRN: [id number] ?Date of Evaluation: 09/03/21 ?Chief Complaint:   ?Diagnosis:  ?Final diagnoses:  ?Medication refill  ? ? ?History of Present illness: Blake Chambers is a 31 y.o. male patient presented to [facility] as a walk. He initally presented with his Child psychotherapist from [employer].  His social worker left.  Patient states she had another appointment ? ?Blake Chambers, 31 y.o., male patient seen face to face by this provider, consulted with Dr. Lucianne Muss; and chart reviewed on 09/03/21.  Per chart review patient has a psychiatric history of bipolar 1 disorder, cocaine abuse, cannabis abuse.  He is also diagnosed with HIV and has services with the [facility]. ? ?Patient presents requesting to be restarted on medications for his "mental illness".  He is unable to recall what he has been diagnosed with, and  which medications he has taken in the past.  Per chart review it has been a significant time since patient has been prescribed medications. ? ?During evaluation Blake Chambers is /14?4.  He is calm/cooperative and makes good eye contact. He/She is speaking in a clear tone at moderate volume, and normal pace; with good eye contact.  He is disheveled.  He denies depression and has a euthymic affect.  Objectively he does not appear to be responding to internal/external stimuli.  He denies AVH/SI/HI.  He contracts for safety.  He is logical and was able to answer questions appropriately.  Patient has remained calm throughout assessment and has answered questions appropriately.   ? ?Discussed outpatient psychiatric services on the second floor including open access walk-in hours.  Patient verbalized understanding and states he will present for a walk-in visit this week.  Also discussed [place] and wellness for primary care needs. ? ?At this time Blake Chambers is educated and verbalizes understanding of mental  health resources and other crisis services in the community.  He is instructed to call 911 and present to the nearest emergency room should he experience any suicidal/homicidal ideation, auditory/visual/hallucinations, or detrimental worsening of his mental health condition.  His was a also advised by Clinical research associate that he could call the toll-free phone on insurance card to assist with identifying in network counselors and agencies or number on back of Medicaid card to speak with care coordinator ?  ? ?Psychiatric Specialty Exam ? ?Presentation  ?General Appearance:Appropriate for Environment; Disheveled ? ?Eye Contact:Fair ? ?Speech:Clear and Coherent; Normal Rate ? ?Speech Volume:Normal ? ?Handedness:Right ? ? ?Mood and Affect  ?Mood:Euthymic ? ?Affect:Congruent ? ? ?Thought Process  ?Thought Processes:Coherent ? ?Descriptions of Associations:Intact ? ?Orientation:Full (Time, Place and Person) ? ?Thought Content:Logical ?   Hallucinations:None ? ?Ideas of Reference:None ? ?Suicidal Thoughts:No ? ?Homicidal Thoughts:No ? ? ?Sensorium  ?Memory:Immediate Good; Recent Good; Remote Good ? ?Judgment:Good ? ?Insight:Good ? ? ?Executive Functions  ?Concentration:Good ? ?Attention Span:Good ? ?Recall:Good ? ?Fund of Knowledge:Good ? ?Language:Good ? ? ?Psychomotor Activity  ?Psychomotor Activity:Normal ? ? ?Assets  ?Assets:Communication Skills; Desire for Improvement; Financial Resources/Insurance; Physical Health; Resilience; Leisure Time ? ? ?Sleep  ?Sleep:Fair ? ?Number of hours: 3 ? ? ?No data recorded ? ?Physical Exam: ?Physical Exam ?Vitals and nursing note reviewed.  ?Constitutional:   ?   Appearance: He is well-developed.  ?HENT:  ?   Head: Normocephalic and atraumatic.  ?Eyes:  ?   General:     ?   Right eye: No discharge.     ?   Left  eye: No discharge.  ?   Conjunctiva/sclera: Conjunctivae normal.  ?Cardiovascular:  ?   Rate and Rhythm: Normal rate.  ?   Heart sounds: No murmur heard. ?Pulmonary:  ?   Effort: Pulmonary  effort is normal. No respiratory distress.  ?Musculoskeletal:     ?   General: Normal range of motion.  ?   Cervical back: Normal range of motion.  ?Skin: ?   Coloration: Skin is not jaundiced or pale.  ?Neurological:  ?   Mental Status: He is alert and oriented to person, place, and time.  ?Psychiatric:     ?   Attention and Perception: Attention and perception normal.     ?   Mood and Affect: Mood and affect normal.     ?   Speech: Speech normal.     ?   Behavior: Behavior normal.     ?   Thought Content: Thought content normal.     ?   Cognition and Memory: Cognition normal.     ?   Judgment: Judgment normal.  ? ?Review of Systems  ?Constitutional: Negative.   ?HENT: Negative.    ?Eyes: Negative.   ?Respiratory: Negative.    ?Cardiovascular: Negative.   ?Musculoskeletal: Negative.   ?Skin: Negative.   ?Neurological: Negative.   ?Psychiatric/Behavioral: Negative.    ?Blood pressure 130/79, pulse (!) 112, temperature 98.2 ?F (36.8 ?C), temperature source Oral, resp. rate 18, SpO2 99 %. There is no height or weight on file to calculate BMI. ? ?Musculoskeletal: ?Strength & Muscle Tone: within normal limits ?Gait & Station: normal ?Patient leans: N/A ? ? ?[facility] MSE Discharge Disposition for Follow up and Recommendations: ?Based on my evaluation the patient does not appear to have an emergency medical condition and can be discharged with resources and follow up care in outpatient services for Medication Management and Individual Therapy ? ?Discharge patient. ? ?Provided outpatient psychiatric resources for outpatient behavioral health services in the second floor including open access walk-in hours.  Provided [place] and wellness resources. ? ?No evidence of imminent risk to self or others at present.    ?Patient does not meet criteria for psychiatric inpatient admission. ?Discussed crisis plan, support from social network, calling 911, coming to the Emergency Department, and calling Suicide Hotline.  ? ?Ardis Hughs, NP ?09/03/2021, 7:20 PM ? ?

## 2021-09-03 NOTE — BH Assessment (Signed)
Pt presents to [facility] with his SW from [employer]. Pt reports [facility] is trying to assist him with getting housing and back on disability. Pt reports he is in need of medications and reports dx of schizoaffective disorder, bipolar type. Pt reports homelessness for the past 3 years. Pt went to ED for help this morning but left AMA. Pt denies SI,HI,AVH and reports [facility] a couple months ago. ? ?PT is routine.  ?

## 2021-09-03 NOTE — ED Triage Notes (Signed)
Arrived with [facility] for hand pains that are cold and painful.  ?

## 2021-09-03 NOTE — Discharge Instructions (Addendum)

## 2021-09-08 ENCOUNTER — Emergency Department (HOSPITAL_COMMUNITY)
Admission: EM | Admit: 2021-09-08 | Discharge: 2021-09-08 | Disposition: A | Discharge status: Home / Self Care | Payer: Medicaid Other | Attending: Emergency Medicine | Admitting: Emergency Medicine

## 2021-09-08 ENCOUNTER — Other Ambulatory Visit: Payer: Self-pay

## 2021-09-08 DIAGNOSIS — Z5900 Homelessness unspecified: Secondary | ICD-10-CM | POA: Insufficient documentation

## 2021-09-08 DIAGNOSIS — F99 Mental disorder, not otherwise specified: Secondary | ICD-10-CM | POA: Insufficient documentation

## 2021-09-08 DIAGNOSIS — R109 Unspecified abdominal pain: Secondary | ICD-10-CM | POA: Diagnosis present

## 2021-09-08 DIAGNOSIS — R1084 Generalized abdominal pain: Secondary | ICD-10-CM | POA: Diagnosis not present

## 2021-09-08 LAB — COMPREHENSIVE METABOLIC PANEL
ALT: 59 U/L — ABNORMAL HIGH (ref 0–44)
AST: 57 U/L — ABNORMAL HIGH (ref 15–41)
Albumin: 3.1 g/dL — ABNORMAL LOW (ref 3.5–5.0)
Alkaline Phosphatase: 61 U/L (ref 38–126)
Anion gap: 3 — ABNORMAL LOW (ref 5–15)
BUN: 9 mg/dL (ref 6–20)
CO2: 25 mmol/L (ref 22–32)
Calcium: 8.5 mg/dL — ABNORMAL LOW (ref 8.9–10.3)
Chloride: 107 mmol/L (ref 98–111)
Creatinine, Ser: 1.14 mg/dL (ref 0.61–1.24)
GFR, Estimated: >60 mL/min (ref >60)
Glucose, Bld: 90 mg/dL (ref 70–99)
Potassium: 4.1 mmol/L (ref 3.5–5.1)
Sodium: 135 mmol/L (ref 135–145)
Total Bilirubin: 0.6 mg/dL (ref 0.3–1.2)
Total Protein: 7.3 g/dL (ref 6.5–8.1)

## 2021-09-08 LAB — RAPID URINE DRUG SCREEN, HOSP PERFORMED
Amphetamines: POSITIVE — AB
Barbiturates: NOT DETECTED
Benzodiazepines: NOT DETECTED
Cocaine: NOT DETECTED
Opiates: NOT DETECTED
Tetrahydrocannabinol: NOT DETECTED

## 2021-09-08 LAB — CBC WITH DIFFERENTIAL/PLATELET
Abs Immature Granulocytes: 0.01 10*3/uL (ref 0.00–0.07)
Basophils Absolute: 0 10*3/uL (ref 0.0–0.1)
Basophils Relative: 0 %
Eosinophils Absolute: 0.1 10*3/uL (ref 0.0–0.5)
Eosinophils Relative: 2 %
HCT: 41.5 % (ref 39.0–52.0)
Hemoglobin: 13.1 g/dL (ref 13.0–17.0)
Immature Granulocytes: 0 %
Lymphocytes Relative: 37 %
Lymphs Abs: 1.9 10*3/uL (ref 0.7–4.0)
MCH: 25.9 pg — ABNORMAL LOW (ref 26.0–34.0)
MCHC: 31.6 g/dL (ref 30.0–36.0)
MCV: 82.2 fL (ref 80.0–100.0)
Monocytes Absolute: 0.5 10*3/uL (ref 0.1–1.0)
Monocytes Relative: 11 %
Neutro Abs: 2.6 10*3/uL (ref 1.7–7.7)
Neutrophils Relative %: 50 %
Platelets: 219 10*3/uL (ref 150–400)
RBC: 5.05 MIL/uL (ref 4.22–5.81)
RDW: 15.8 % — ABNORMAL HIGH (ref 11.5–15.5)
WBC: 5.2 10*3/uL (ref 4.0–10.5)
nRBC: 0 % (ref 0.0–0.2)

## 2021-09-08 LAB — LIPASE, BLOOD: Lipase: 30 U/L (ref 11–51)

## 2021-09-08 LAB — ETHANOL: Alcohol, Ethyl (B): <10 mg/dL (ref <10)

## 2021-09-08 LAB — CK: Total CK: 465 U/L — ABNORMAL HIGH (ref 49–397)

## 2021-09-08 MED ORDER — SODIUM CHLORIDE 0.9 % IV BOLUS
1000.0000 mL | Freq: Once | INTRAVENOUS | Status: AC
  Administered 2021-09-08: 1000 mL via INTRAVENOUS

## 2021-09-08 NOTE — ED Triage Notes (Signed)
Pt c/o generalized abd pain and cramping and muscle spasm. ?

## 2021-09-08 NOTE — Discharge Instructions (Signed)
Please make sure that you are hydrating appropriately drinking plenty of water.  He may take Tylenol 1000 mg as needed for headaches.  Please follow-up with the outpatient PCP ?

## 2021-09-08 NOTE — ED Provider Notes (Signed)
?Blake Chambers ?Provider Note ? ? ?CSN: [id number] ?Arrival date & time: 09/08/21  0126 ? ?  ? ?History ? ?Chief Complaint  ?Patient presents with  ? Abdominal Pain  ? ? ?Blake Chambers is a 31 y.o. male. ? ? ?Abdominal Pain ? ?Patient is a 31 year old male with a past medical history significant for bipolar, HIV, syphilis, renal insufficiency, polysubstance abuse, malingering ? ?Patient is presented to ER today with complaints of a he diffuse 7/10 abdominal pain he describes it as his entire abdomen.  States is been constant since yesterday.  No nausea vomiting diarrhea no chest pain difficulty breathing no fevers and endorses a mild headache that is circumferential and aching and mild.  No numbness or weakness slurred speech or confusion no head trauma.  No other associate symptoms.  No aggravating factors. ? ?Denies any neck pain ? ?  ? ?Home Medications ?Prior to Admission medications   ?Medication Sig Start Date End Date Taking? Authorizing Provider  ?ibuprofen (ADVIL) 600 MG tablet Take 1 tablet (600 mg total) by mouth every 6 (six) hours as needed. 06/30/21   Chambers Dessert, MD  ?   ? ?Allergies    ?Tenofovir disoproxil   ? ?Review of Systems   ?Review of Systems  ?Gastrointestinal:  Positive for abdominal pain.  ? ?Physical Exam ?Updated Vital Signs ?BP 107/67   Pulse 79   Temp (!) 97.3 ?F (36.3 ?C) (Oral)   Resp 16   Ht '5\' 9"'  (1.753 m)   Wt 70.8 kg   SpO2 100%   BMI 23.05 kg/m?  ?Physical Exam ?Vitals and nursing note reviewed.  ?Constitutional:   ?   General: He is not in acute distress. ?   Comments: Drowsy but arousable.  Pleasant, able answer questions properly follow commands.  ?HENT:  ?   Head: Normocephalic and atraumatic.  ?   Nose: Nose normal.  ?Eyes:  ?   General: No scleral icterus. ?Cardiovascular:  ?   Rate and Rhythm: Normal rate and regular rhythm.  ?   Pulses: Normal pulses.  ?   Heart sounds: Normal heart sounds.  ?Pulmonary:  ?   Effort:  Pulmonary effort is normal. No respiratory distress.  ?   Breath sounds: No wheezing.  ?Abdominal:  ?   Palpations: Abdomen is soft.  ?   Tenderness: There is no abdominal tenderness.  ?Musculoskeletal:  ?   Cervical back: Normal range of motion.  ?   Right lower leg: No edema.  ?   Left lower leg: No edema.  ?Skin: ?   General: Skin is warm and dry.  ?   Capillary Refill: Capillary refill takes less than 2 seconds.  ?Neurological:  ?   Mental Status: He is alert. Mental status is at baseline.  ?Psychiatric:     ?   Mood and Affect: Mood normal.     ?   Behavior: Behavior normal.  ? ? ?ED Results / Procedures / Treatments   ?Labs ?(all labs ordered are listed, but only abnormal results are displayed) ?Labs Reviewed  ?CK - Abnormal; Notable for the following components:  ?    Result Value  ? Total CK 465 (*)   ? All other components within normal limits  ?CBC WITH DIFFERENTIAL/PLATELET - Abnormal; Notable for the following components:  ? MCH 25.9 (*)   ? RDW 15.8 (*)   ? All other components within normal limits  ?COMPREHENSIVE METABOLIC PANEL - Abnormal; Notable for the following  components:  ? Calcium 8.5 (*)   ? Albumin 3.1 (*)   ? AST 57 (*)   ? ALT 59 (*)   ? Anion gap 3 (*)   ? All other components within normal limits  ?ETHANOL  ?LIPASE, BLOOD  ?RAPID URINE DRUG SCREEN, HOSP PERFORMED  ? ? ?EKG ?None ? ?Radiology ?No results found. ? ?Procedures ?Procedures  ? ? ?Medications Ordered in ED ?Medications  ?sodium chloride 0.9 % bolus 1,000 mL (0 mLs Intravenous Stopped 09/08/21 0855)  ? ? ?ED Course/ Medical Decision Making/ A&P ?Clinical Course as of 09/08/21 0856  ?Sun Sep 08, 2021  ?0700 Homeless walking 10-15 miles per day and couch surfing. States abd pain since yesterday without NVD. No CP or SOB. No fevers. Endorses HA as well.  [WF]  ?  ?Clinical Course User Index ?[WF] Chambers Blake, [place]  ? ?                        ?Medical Decision Making ?Amount and/or Complexity of Data Reviewed ?Labs:  ordered. ? ? ?Patient is a 31 year old male with a past medical history significant for bipolar, HIV, syphilis, renal insufficiency, polysubstance abuse, malingering ? ?Patient is presented to ER today with complaints of a he diffuse 7/10 abdominal pain he describes it as his entire abdomen.  States is been constant since yesterday.  No nausea vomiting diarrhea no chest pain difficulty breathing no fevers and endorses a mild headache that is circumferential and aching and mild.  No numbness or weakness slurred speech or confusion no head trauma.  No other associate symptoms.  No aggravating factors. ? ?Denies any neck pain ? ? ?Physical exam reassuring patient somewhat drowsy but I am able to interview him.  Abdomen is soft not particularly tender. ? ? ?Work-up.  Quite reassuring.  I personally reviewed all labs patient has a CK of 500, CBC without leukocytosis or clinically significant anemia.  CMP unremarkable apart from mild transaminitis will need to follow-up with PCP about this.  Normal bilirubin and normal alk phos doubt any sort of obstructive biliary disease.  Lipase within normal limits hepatitis ethanol undetectably low.  UDS positive for amphetamines.  Patient does have history of polysubstance abuse. ? ?Discussed with patient at reassessment.  He states he feels improved.  He has received 1 L normal saline for dehydration ? ?He states that his muscle cramping has improved. ?I reviewed his lab results with him.  He understands the need for more consistent hydration and eventually decreasing physical activity some. ? ?ER return precautions reviewed.  Will need to follow-up with his PCP. ? ?Final Clinical Impression(s) / ED Diagnoses ?Final diagnoses:  ?Generalized abdominal pain  ?Homeless  ?Mental health disorder  ? ? ?Rx / DC Orders ?ED Discharge Orders   ? ? None  ? ?  ? ? ?  ?Chambers Blake, [place] ?09/08/21 0920 ? ?  ?Chambers Dessert, MD ?09/08/21 1552 ? ?

## 2021-09-30 ENCOUNTER — Telehealth: Payer: Self-pay

## 2021-09-30 NOTE — Telephone Encounter (Signed)
Patient is currently incarcerated at [facility].  ?

## 2021-10-03 ENCOUNTER — Ambulatory Visit (HOSPITAL_COMMUNITY)
Admission: EM | Admit: 2021-10-03 | Discharge: 2021-10-03 | Disposition: A | Discharge status: Home / Self Care | Payer: Medicaid Other | Attending: Psychiatry | Admitting: Psychiatry

## 2021-10-03 DIAGNOSIS — F1410 Cocaine abuse, uncomplicated: Secondary | ICD-10-CM | POA: Insufficient documentation

## 2021-10-03 DIAGNOSIS — F411 Generalized anxiety disorder: Secondary | ICD-10-CM | POA: Insufficient documentation

## 2021-10-03 NOTE — Progress Notes (Signed)
TRIAGE: ROUTINE ? ? 10/03/21 0210  ?[facility] Triage Screening (Walk-ins at [facility] only)  ?How Did You Hear About [place]? Self  ?What Is the Reason for Your Visit/Call Today? Pt states, "I almost had a breakdown. I tried to reason with people but they choose to let people (my friends) expose my business." Pt shares that 2-3 days ago he had to go to jail for 1 1/2 days for prior larceny charges that he missed his court dates for. He states he left 2 bags of things at a friend's house and he knows they're going to go through his things. Pt states, "I'm always isolating for the wrong reason. My friend's roommate put my personal information out there." Pt denies SI, a hx of attempts to kill himself, or a plan to kill himself. He denies HI, AVH, NSSIB, and access to guns/weapons. Pt states he smoked marijuana for the first time in 4 years tonight but denies any other SA. When asked how the [facility] can assist him tonight, pt states, "I just wanted to get this off my chest."  ?How Long Has This Been Causing You Problems? <Week  ?Have You Recently Had Any Thoughts About Hurting Yourself? No  ?Are You Planning to Commit Suicide/Harm Yourself At This time? No  ?Have you Recently Had Thoughts About Hurting Someone Karolee Ohs? No  ?Are You Planning To Harm Someone At This Time? No  ?Are you currently experiencing any auditory, visual or other hallucinations? No  ?Have You Used Any Alcohol or Drugs in the Past 24 Hours? Yes  ?How long ago did you use Drugs or Alcohol? Pt states he smoked marijuana for the first time in 4 years tonight but denies any other SA.  ?What Did You Use and How Much? Pt states he smoked marijuana for the first time in 4 years tonight but denies any other SA.  ?Do you have any current medical co-morbidities that require immediate attention? No  ?Clinician description of patient physical appearance/behavior: Pt is dressed in casual attire. He is able to identify his thoughts, feelings, and concerns. Pt is speaking rapidly and  it's difficult to interject, though he responds appropriately and answers the questions posed.  ?What Do You Feel Would Help You the Most Today?  ?(Pt states, "I just wanted to get this off my chest.")  ?If access to [facility] Urgent Care was not available, would you have sought care in the Emergency Department? No  ?Determination of Need Routine (7 days)  ?Options For Referral Medication Management;Outpatient Therapy  ? ? ?

## 2021-10-03 NOTE — Discharge Instructions (Signed)
Pt to f/u or call 911 if suicidal  ?

## 2021-10-03 NOTE — ED Notes (Signed)
Discharge instructions given - patient verbalizes understanding. Patient discharged to home ?

## 2021-10-03 NOTE — ED Provider Notes (Signed)
Behavioral Health Urgent Care Medical Screening Exam ? ?Patient Name: Blake Chambers ?MRN: [id number] ?Date of Evaluation: 10/03/21 ?Chief Complaint:   ?Diagnosis:  ?Final diagnoses:  ?Anxiety state  ?Cocaine abuse ([place])  ? ? ?History of Present illness: Blake Chambers is a 31 y.o. male. Present to [facility] because he want to talk with someone to get things off his chest,  per the patient his friends did him wrong and he just want to move to a better living.  Pt has a history of Bipolar I disorder, cocaine abuse,   ?Observation of pt he is alert and oriented x 4,  speech clear,  mood relax,  affect congruent with mood.  Pt denies SI, HI, AVH or paranoia.  Pt state he was incarcerated a few day ago for an old charge of shop lifting. Per the patient he is staying with a friend on [address],  He states he as a brother and sister in the area, he reach out to his sister but didn't hear back from her.  He brother want him to get a job.  Pt stated that all I wanted to say.  ? ?Recommend discharge to home, pt is advice to call 911 or come back to [facility] if he experience any SI,  pt verbalize understanding,  at this time there is no evidence that patient is influence by internal or external stimuli  ? ?Psychiatric Specialty Exam ? ?Presentation  ?General Appearance:Casual ? ?Eye Contact:Fair ? ?Speech:Clear and Coherent ? ?Speech Volume:Normal ? ?Handedness:Ambidextrous ? ? ?Mood and Affect  ?Mood:Anxious ? ?Affect:Appropriate ? ? ?Thought Process  ?Thought Processes:Coherent ? ?Descriptions of Associations:Circumstantial ? ?Orientation:Full (Time, Place and Person) ? ?Thought Content:Abstract Reasoning ?   Hallucinations:None ? ?Ideas of Reference:None ? ?Suicidal Thoughts:No ? ?Homicidal Thoughts:No ? ? ?Sensorium  ?Memory:Immediate Fair ? ?Judgment:Fair ? ?Insight:Fair ? ? ?Executive Functions  ?Concentration:Fair ? ?Attention Span:Fair ? ?Recall:Fair ? ?[place] ? ?Language:Fair ? ? ?Psychomotor Activity   ?Psychomotor Activity:Normal ? ? ?Assets  ?Assets:Communication Skills; Desire for Improvement; Financial Resources/Insurance; Physical Health; Resilience; Leisure Time ? ? ?Sleep  ?Sleep:Fair ? ?Number of hours: 3 ? ? ?Nutritional Assessment (For OBS and FBC admissions only) ?Has the patient had a weight loss or gain of 10 pounds or more in the last 3 months?: No ?Has the patient had a decrease in food intake/or appetite?: No ?Does the patient have dental problems?: No ?Does the patient have eating habits or behaviors that may be indicators of an eating disorder including binging or inducing vomiting?: No ?Has the patient recently lost weight without trying?: 0 ?Has the patient been eating poorly because of a decreased appetite?: 0 ?Malnutrition Screening Tool Score: 0 ? ? ? ?Physical Exam: ?Physical Exam ?HENT:  ?   Head: Normocephalic.  ?   Nose: Nose normal.  ?Cardiovascular:  ?   Rate and Rhythm: Normal rate.  ?Pulmonary:  ?   Effort: Pulmonary effort is normal.  ?Musculoskeletal:     ?   General: Normal range of motion.  ?   Cervical back: Normal range of motion.  ?Skin: ?   General: Skin is warm.  ?Neurological:  ?   General: No focal deficit present.  ?   Mental Status: He is alert.  ?Psychiatric:     ?   Mood and Affect: Mood normal.     ?   Thought Content: Thought content normal.     ?   Judgment: Judgment normal.  ? ?Review of Systems  ?  Constitutional: Negative.   ?HENT: Negative.    ?Eyes: Negative.   ?Respiratory: Negative.    ?Cardiovascular: Negative.   ?Gastrointestinal: Negative.   ?Genitourinary: Negative.   ?Musculoskeletal: Negative.   ?Skin: Negative.   ?Neurological: Negative.   ?Endo/Heme/Allergies: Negative.   ?Psychiatric/Behavioral:  Positive for substance abuse. The patient is nervous/anxious.   ?Blood pressure 125/77, pulse (!) 121, temperature (!) 97.5 ?F (36.4 ?C), temperature source Oral, resp. rate 20, SpO2 97 %. There is no height or weight on file to calculate  BMI. ? ?Musculoskeletal: ?Strength & Muscle Tone: within normal limits ?Gait & Station: normal ?Patient leans: N/A ? ? ?[facility] MSE Discharge Disposition for Follow up and Recommendations: ?Pt just wanted someone to talk with this AM.  Pt advice he can return to [facility] if need be.  ? ? ?Evette Georges, NP ?10/03/2021, 3:07 AM ? ?

## 2021-10-05 ENCOUNTER — Telehealth (HOSPITAL_COMMUNITY): Payer: Self-pay | Admitting: Pediatrics

## 2021-10-05 NOTE — BH Assessment (Signed)
Care Management - [facility]UC Follow Up Discharges  ° °Writer attempted to make contact with patient today and was unsuccessful.  Writer left a HIPPA compliant voice message.  ° °Per chart review, patient was provided with outpatient resources. ° °

## 2021-10-08 ENCOUNTER — Emergency Department (HOSPITAL_COMMUNITY)
Admission: EM | Admit: 2021-10-08 | Discharge: 2021-10-08 | Disposition: A | Discharge status: Home / Self Care | Payer: Medicaid Other | Attending: Emergency Medicine | Admitting: Emergency Medicine

## 2021-10-08 ENCOUNTER — Emergency Department (HOSPITAL_COMMUNITY): Payer: Medicaid Other

## 2021-10-08 ENCOUNTER — Encounter (HOSPITAL_COMMUNITY): Payer: Self-pay

## 2021-10-08 ENCOUNTER — Other Ambulatory Visit: Payer: Self-pay

## 2021-10-08 DIAGNOSIS — M25512 Pain in left shoulder: Secondary | ICD-10-CM | POA: Diagnosis present

## 2021-10-08 NOTE — ED Provider Notes (Signed)
?[name] EMERGENCY DEPARTMENT ?Provider Note ? ? ?CSN: [id number] ?Arrival date & time: 10/08/21  0116 ? ?  ? ?History ? ?Chief Complaint  ?Patient presents with  ? Arm Pain  ? ? ?Blake Chambers is a 31 y.o. male who presents to the ED for evaluation of left shoulder pain.  History is difficult as patient is sleeping, difficult to arouse and snoring.  When I am able to keep him awake, he reports that his left shoulder started hurting him last night.  Unable to describe pain further. No known trauma or injury.  He states that he is feeling better now.  Per parent note, patient was evaluated EMS on streets, intoxicated.  He denies numbness and tingling.  He denies chest pain and all other systemic complaints. ? ? ?Arm Pain ? ? ?  ? ?Home Medications ?Prior to Admission medications   ?Medication Sig Start Date End Date Taking? Authorizing Provider  ?ibuprofen (ADVIL) 600 MG tablet Take 1 tablet (600 mg total) by mouth every 6 (six) hours as needed. 06/30/21   Gwyneth Sprout, MD  ?   ? ?Allergies    ?Tenofovir disoproxil   ? ?Review of Systems   ?Review of Systems ? ?Physical Exam ?Updated Vital Signs ?BP 112/82 (BP Location: Left Arm)   Pulse 78   Temp 97.9 ?F (36.6 ?C)   Resp 17   Ht 5\' 9"  (1.753 m)   Wt 70 kg   SpO2 (!) 76%   BMI 22.79 kg/m?  ?Physical Exam ?Vitals and nursing note reviewed.  ?Constitutional:   ?   General: He is not in acute distress. ?   Appearance: He is not ill-appearing.  ?   Comments: Patient is sleeping on bed, snoring loudly.  He is reclined with both arms full extended above his head using head as pillow, does not seem to be in pain  ?HENT:  ?   Head: Atraumatic.  ?Eyes:  ?   Conjunctiva/sclera: Conjunctivae normal.  ?Cardiovascular:  ?   Rate and Rhythm: Normal rate and regular rhythm.  ?   Pulses: Normal pulses.  ?   Heart sounds: No murmur heard. ?Pulmonary:  ?   Effort: Pulmonary effort is normal. No respiratory distress.  ?   Breath sounds: Normal breath  sounds.  ?Abdominal:  ?   General: Abdomen is flat. There is no distension.  ?   Palpations: Abdomen is soft.  ?   Tenderness: There is no abdominal tenderness.  ?Musculoskeletal:     ?   General: Normal range of motion.  ?   Cervical back: Normal range of motion.  ?   Comments: Left shoulder without palpable deformity, tenderness, bruising or erythema.  Full ROM.  Radial pulses intact.  ?Skin: ?   General: Skin is warm and dry.  ?   Capillary Refill: Capillary refill takes less than 2 seconds.  ?Neurological:  ?   General: No focal deficit present.  ?   Mental Status: He is alert.  ?Psychiatric:     ?   Mood and Affect: Mood normal.  ? ? ?ED Results / Procedures / Treatments   ?Labs ?(all labs ordered are listed, but only abnormal results are displayed) ?Labs Reviewed - No data to display ? ?EKG ?None ? ?Radiology ?DG Shoulder Left ? ?Result Date: 10/08/2021 ?CLINICAL DATA:  Left shoulder pain EXAM: LEFT SHOULDER - 2+ VIEW COMPARISON:  None. FINDINGS: There is no evidence of fracture or dislocation. There is no evidence of  arthropathy or other focal bone abnormality. Soft tissues are unremarkable. IMPRESSION: Negative. Electronically Signed   By: Jasmine Pang M.D.   On: 10/08/2021 01:59   ? ?Procedures ?Procedures  ? ? ?Medications Ordered in ED ?Medications - No data to display ? ?ED Course/ Medical Decision Making/ A&P ?  ?                        ?Medical Decision Making ? ?31 year old male in no acute distress, nontoxic-appearing presents to the ED for evaluation of left shoulder pain.  Patient found reclining in bed, arms fully extended above his head using his arms as a pillow.  Vitals normal. Left shoulder exam without acute findings.  Patient is nontender.  Left shoulder x-ray without acute findings.  I personally viewed and interpreted this imaging and agree with radiologist interpretation. Given reassuring workup and PE, patient is stable for discharge. ? ?Final Clinical Impression(s) / ED Diagnoses ?Final  diagnoses:  ?Acute pain of left shoulder  ? ? ?Rx / DC Orders ?ED Discharge Orders   ? ? None  ? ?  ? ? ?  ?Janell Quiet, [place] ?10/08/21 0710 ? ?  ?Sloan Leiter, DO ?10/13/21 1904 ? ?

## 2021-10-08 NOTE — Discharge Instructions (Signed)
Your x-ray was negative for any acute findings of your shoulder and your exam was reassuring. Take tylenol or motrin as needed for pain and follow up with PCP. ?

## 2021-10-08 NOTE — ED Provider Triage Note (Signed)
Emergency Medicine Provider Triage Evaluation Note ? ?Blake Chambers , a 31 y.o. male  was evaluated in triage.  Pt complains of left shoulder pain.  Patient was [place] by EMS from [place].  Denies any falls.  States it has been hurting since 8 PM.  Patient is visibly intoxicated.  Denies any numbness or tingling. Denies chest pain  ? ?Review of Systems  ?Positive: As above ?Negative: As above  ? ?Physical Exam  ?BP 121/81 (BP Location: Right Arm)   Pulse 88   Temp 98 ?F (36.7 ?C) (Oral)   Resp 16   SpO2 100%  ?Gen:   Awake, no distress   ?Resp:  Normal effort  ?MSK:   Moves extremities without difficulty, pt reports pain with left arm movement. No noticeable stepoffs or crepitus  ?Other:   ? ?Medical Decision Making  ?Medically screening exam initiated at 1:29 AM.  Appropriate orders placed.  Blake Chambers was informed that the remainder of the evaluation will be completed by another provider, this initial triage assessment does not replace that evaluation, and the importance of remaining in the ED until their evaluation is complete. ? ? ?  ?Garald Balding, PA-C ?10/08/21 0131 ? ?

## 2021-10-08 NOTE — ED Triage Notes (Signed)
Pt with complaints of left arm pain. No injury, no swelling. Pt has full ROM ?

## 2021-10-16 ENCOUNTER — Encounter (HOSPITAL_COMMUNITY): Payer: Self-pay

## 2021-10-16 ENCOUNTER — Emergency Department (HOSPITAL_COMMUNITY)
Admission: EM | Admit: 2021-10-16 | Discharge: 2021-10-16 | Discharge status: Against Medical Advice | Payer: Medicaid Other | Attending: Emergency Medicine | Admitting: Emergency Medicine

## 2021-10-16 DIAGNOSIS — R109 Unspecified abdominal pain: Secondary | ICD-10-CM | POA: Diagnosis not present

## 2021-10-16 DIAGNOSIS — Z5321 Procedure and treatment not carried out due to patient leaving prior to being seen by health care provider: Secondary | ICD-10-CM | POA: Diagnosis not present

## 2021-10-16 LAB — CBC WITH DIFFERENTIAL/PLATELET
Abs Immature Granulocytes: 0.02 10*3/uL (ref 0.00–0.07)
Basophils Absolute: 0 10*3/uL (ref 0.0–0.1)
Basophils Relative: 1 %
Eosinophils Absolute: 0.1 10*3/uL (ref 0.0–0.5)
Eosinophils Relative: 1 %
HCT: 41.2 % (ref 39.0–52.0)
Hemoglobin: 13.3 g/dL (ref 13.0–17.0)
Immature Granulocytes: 0 %
Lymphocytes Relative: 29 %
Lymphs Abs: 1.8 10*3/uL (ref 0.7–4.0)
MCH: 26.7 pg (ref 26.0–34.0)
MCHC: 32.3 g/dL (ref 30.0–36.0)
MCV: 82.6 fL (ref 80.0–100.0)
Monocytes Absolute: 0.6 10*3/uL (ref 0.1–1.0)
Monocytes Relative: 10 %
Neutro Abs: 3.6 10*3/uL (ref 1.7–7.7)
Neutrophils Relative %: 59 %
Platelets: 290 10*3/uL (ref 150–400)
RBC: 4.99 MIL/uL (ref 4.22–5.81)
RDW: 15.8 % — ABNORMAL HIGH (ref 11.5–15.5)
WBC: 6 10*3/uL (ref 4.0–10.5)
nRBC: 0 % (ref 0.0–0.2)

## 2021-10-16 LAB — COMPREHENSIVE METABOLIC PANEL
ALT: 52 U/L — ABNORMAL HIGH (ref 0–44)
AST: 53 U/L — ABNORMAL HIGH (ref 15–41)
Albumin: 3.2 g/dL — ABNORMAL LOW (ref 3.5–5.0)
Alkaline Phosphatase: 66 U/L (ref 38–126)
Anion gap: 6 (ref 5–15)
BUN: 14 mg/dL (ref 6–20)
CO2: 24 mmol/L (ref 22–32)
Calcium: 8.8 mg/dL — ABNORMAL LOW (ref 8.9–10.3)
Chloride: 107 mmol/L (ref 98–111)
Creatinine, Ser: 1.2 mg/dL (ref 0.61–1.24)
GFR, Estimated: >60 mL/min (ref >60)
Glucose, Bld: 121 mg/dL — ABNORMAL HIGH (ref 70–99)
Potassium: 4.4 mmol/L (ref 3.5–5.1)
Sodium: 137 mmol/L (ref 135–145)
Total Bilirubin: 0.7 mg/dL (ref 0.3–1.2)
Total Protein: 7.4 g/dL (ref 6.5–8.1)

## 2021-10-16 LAB — LIPASE, BLOOD: Lipase: 34 U/L (ref 11–51)

## 2021-10-16 NOTE — ED Provider Triage Note (Signed)
Emergency Medicine Provider Triage Evaluation Note ? ?Naoma Diener , a 31 y.o. male  was evaluated in triage.  Pt complains of abdominal pain of about 2 to 3-day duration.  Describes it as sharp and stabbing.  Denies nausea, vomiting, fever, dysuria, or flank pain. ? ?Review of Systems  ?Positive: As above ?Negative: As above ? ?Physical Exam  ?BP 127/84 (BP Location: Right Arm)   Pulse 76   Temp 98.1 ?F (36.7 ?C) (Oral)   Resp 16   SpO2 90%  ?Gen:   Awake, no distress   ?Resp:  Normal effort  ?MSK:   Moves extremities without difficulty  ?Other:  Abdomen with diffuse tenderness to palpation.  Nondistended.  Without flank tenderness.  Otherwise patient is well-appearing. ? ?Medical Decision Making  ?Medically screening exam initiated at 4:32 AM.  Appropriate orders placed.  Senon Nixon was informed that the remainder of the evaluation will be completed by another provider, this initial triage assessment does not replace that evaluation, and the importance of remaining in the ED until their evaluation is complete. ? ? ?  ?Marita [place], PA-C ?10/16/21 9628 ? ?

## 2021-10-16 NOTE — ED Notes (Signed)
Called pt x3 by Vera(NT) and this NT, no response. ?

## 2021-10-16 NOTE — ED Triage Notes (Signed)
Pt c/o ongoing LUQ abdominal pain. Pt denies N/V/D. No acute distress during triage.  ?

## 2021-10-20 ENCOUNTER — Encounter (HOSPITAL_COMMUNITY): Payer: Self-pay | Admitting: Emergency Medicine

## 2021-10-20 ENCOUNTER — Emergency Department (HOSPITAL_COMMUNITY)
Admission: EM | Admit: 2021-10-20 | Discharge: 2021-10-20 | Disposition: A | Discharge status: Home / Self Care | Payer: Medicaid Other | Attending: Emergency Medicine | Admitting: Emergency Medicine

## 2021-10-20 ENCOUNTER — Other Ambulatory Visit: Payer: Self-pay

## 2021-10-20 DIAGNOSIS — R6884 Jaw pain: Secondary | ICD-10-CM | POA: Diagnosis present

## 2021-10-20 DIAGNOSIS — K089 Disorder of teeth and supporting structures, unspecified: Secondary | ICD-10-CM | POA: Diagnosis not present

## 2021-10-20 MED ORDER — PENICILLIN V POTASSIUM 500 MG PO TABS: 500.0000 mg | ORAL_TABLET | Freq: Four times a day (QID) | ORAL | 0 refills | Status: AC

## 2021-10-20 NOTE — ED Triage Notes (Addendum)
Presents with [facility] for L jaw pain and swelling for the past several days, states that he believes this is related to using too much mouthwash. ?NAD, poor dentition.  ?

## 2021-10-20 NOTE — Discharge Instructions (Addendum)
Take the prescribed medication as directed. ?Follow-up with dentist-- can call for appt. ?Return to the ED for new or worsening symptoms. ?

## 2021-10-20 NOTE — ED Provider Notes (Signed)
?[facility] ?Provider Note ? ? ?CSN: [id number] ?Arrival date & time: 10/20/21  0119 ? ?  ? ?History ? ?Chief Complaint  ?Patient presents with  ? Jaw Pain  ? ? ?Blake Chambers is a 31 y.o. male. ? ?The history is provided by the patient and medical records.  ? ?31 y.o. M here with left sided jaw/dental pain for the past several days.  Reports he has been using too much mouthwash and thinks that is why.  He denies fever/chills.  He is eating/drinking, no trouble swallowing.  Not established with dentist currently. ? ?Home Medications ?Prior to Admission medications   ?Medication Sig Start Date End Date Taking? Authorizing Provider  ?ibuprofen (ADVIL) 600 MG tablet Take 1 tablet (600 mg total) by mouth every 6 (six) hours as needed. 06/30/21   Blanchie Dessert, MD  ?   ? ?Allergies    ?Tenofovir disoproxil and Kiwi extract   ? ?Review of Systems   ?Review of Systems  ?HENT:  Positive for dental problem.   ?All other systems reviewed and are negative. ? ?Physical Exam ?Updated Vital Signs ?BP 119/82 (BP Location: Left Arm)   Pulse (!) 107   Temp 98.7 ?F (37.1 ?C) (Oral)   Resp 16   Wt 68.9 kg   SpO2 99%   BMI 22.45 kg/m?  ? ?Physical Exam ?Vitals and nursing note reviewed.  ?Constitutional:   ?   Appearance: He is well-developed.  ?HENT:  ?   Head: Normocephalic and atraumatic.  ?   Mouth/Throat:  ?   Comments: Teeth largely in poor dentition,  several broken and decayed teeth present, no appreciable gingival or facial swelling , handling secretions appropriately, no trismus, no facial or neck swelling, normal phonation without stridor ?Eyes:  ?   Conjunctiva/sclera: Conjunctivae normal.  ?   Pupils: Pupils are equal, round, and reactive to light.  ?Cardiovascular:  ?   Rate and Rhythm: Normal rate and regular rhythm.  ?   Heart sounds: Normal heart sounds.  ?Pulmonary:  ?   Effort: Pulmonary effort is normal.  ?   Breath sounds: Normal breath sounds.  ?Abdominal:  ?    General: Bowel sounds are normal.  ?   Palpations: Abdomen is soft.  ?Musculoskeletal:     ?   General: Normal range of motion.  ?   Cervical back: Normal range of motion.  ?Skin: ?   General: Skin is warm and dry.  ?Neurological:  ?   Mental Status: He is alert and oriented to person, place, and time.  ? ? ?ED Results / Procedures / Treatments   ?Labs ?(all labs ordered are listed, but only abnormal results are displayed) ?Labs Reviewed - No data to display ? ?EKG ?None ? ?Radiology ?No results found. ? ?Procedures ?Procedures  ? ? ?Medications Ordered in ED ?Medications - No data to display ? ?ED Course/ Medical Decision Making/ A&P ?  ?                        ?Medical Decision Making ?Risk ?Prescription drug management. ? ? ?31 year old male presenting to the ED with left-sided jaw/dental pain.  He thinks it is because he has been using too much mouthwash.  He is afebrile, nontoxic.  He has generally poor dentition on exam with numerous broken/decayed teeth.  He has no discrete gingival swelling or abscess noted.  He has no significant facial or neck swelling on my exam.  He is handling secretions well, no stridor.  No signs or symptoms concerning for Ludwig's angina.  Will start on abx and refer to dentist for follow-up.  Can return here for any new/acute changes. ? ?Final Clinical Impression(s) / ED Diagnoses ?Final diagnoses:  ?Poor dentition  ? ? ?Rx / DC Orders ?ED Discharge Orders   ? ?      Ordered  ?  penicillin v potassium (VEETID) 500 MG tablet  4 times daily       ? 10/20/21 0616  ? ?  ?  ? ?  ? ? ?  ?Larene Pickett, PA-C ?10/20/21 [id number] ? ?  ?Maudie Flakes, MD ?10/20/21 [phone] ? ?

## 2021-10-20 NOTE — ED Notes (Signed)
E-signature pad unavailable at time of pt discharge. This RN discussed discharge materials with pt and answered all pt questions. Pt stated understanding of discharge material. ? ?

## 2021-11-01 ENCOUNTER — Ambulatory Visit (INDEPENDENT_AMBULATORY_CARE_PROVIDER_SITE_OTHER): Payer: Medicaid Other | Admitting: Internal Medicine

## 2021-11-01 ENCOUNTER — Other Ambulatory Visit: Payer: Self-pay

## 2021-11-01 ENCOUNTER — Ambulatory Visit: Payer: Medicaid Other

## 2021-11-01 ENCOUNTER — Encounter: Payer: Self-pay | Admitting: Internal Medicine

## 2021-11-01 ENCOUNTER — Other Ambulatory Visit (HOSPITAL_COMMUNITY): Payer: Self-pay

## 2021-11-01 VITALS — BP 109/71 | HR 90 | Temp 97.5°F | Ht 69.0 in | Wt 153.0 lb

## 2021-11-01 DIAGNOSIS — B20 Human immunodeficiency virus [HIV] disease: Secondary | ICD-10-CM | POA: Diagnosis present

## 2021-11-01 MED ORDER — BICTEGRAVIR-EMTRICITAB-TENOFOV 50-200-25 MG PO TABS: 1.0000 | ORAL_TABLET | Freq: Every day | ORAL | 0 refills | Status: AC

## 2021-11-01 MED ORDER — BICTEGRAVIR-EMTRICITAB-TENOFOV 50-200-25 MG PO TABS
1.0000 | ORAL_TABLET | Freq: Every day | ORAL | 11 refills | Status: DC
  Filled 2021-11-01 – 2021-11-04 (×2): qty 30, 30d supply, fill #0
  Filled 2021-12-12: qty 30, 30d supply, fill #1

## 2021-11-01 NOTE — Progress Notes (Signed)
Patient ID: Blake Chambers, male   DOB: 1990/10/26, 31 y.o.   MRN: [id number]    [facility] for Infectious Disease      Reason for Consult: reestablish care for HIV    Referring Physician: ED    Patient ID: Blake Chambers, male    DOB: August 23, 1990, 31 y.o.   MRN: [id number]  HPI:   He is here after a 5 year absence to get back into care for his HIV He has a long history of poorly controlled mental health issues and did have labs in March with a viral load of 18,300 and CD4 of 620 last year.  He has not been on medication.  He last took Prezcobix and Descovy and no resistance mutations noted in the past.  Most recent labs with wild type genotype.  He reports his weight is stable.  His last drug use was about 3 months ago and quit 'cold [place]'.  He has had many ED visits. He was told in jail that he had diabetes.  He is working with [facility].  He does not have any medications.     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 Authorizing Provider  ibuprofen (ADVIL) 600 MG tablet Take 1 tablet (600 mg total) by mouth every 6 (six) hours as needed. 06/30/21   Gwyneth Sprout, MD    Allergies  Allergen Reactions   Tenofovir Disoproxil Other (See Comments)    Intolerance, increase in creatinine   (TRUVADA)    Kiwi Extract Swelling    Social History   Tobacco Use   Smoking status: Some Days    Packs/day: 0.30    Types: Cigarettes   Smokeless tobacco: Never   Tobacco comments:    pt. trying to quit  Vaping Use   Vaping Use: Never used  Substance Use Topics   Alcohol use: Not Currently    Alcohol/week: 1.0 standard drink    Types: 1 Standard drinks or equivalent per week   Drug use: Not  Currently    Frequency: 1.0 times per week    Types: Marijuana, Cocaine, Methamphetamines    Family History  Family history unknown: Yes    Review of Systems  Constitutional: negative for fevers and chills Gastrointestinal: negative for diarrhea Integument/breast: negative for rash All other systems reviewed and are negative    Constitutional: in no apparent distress  Vitals:   11/01/21 0941  BP: 109/71  Pulse: 90  Temp: (!) 97.5 F (36.4 C)  SpO2: 99%   EYES: anicteric ENMT: no thrush Respiratory: normal respiratory effort GI: soft Musculoskeletal: no edema Skin: no rash Neuro: non-focal Psych: disheveled appearance, poor eye contact; pleasant, appropriate  Labs: Lab Results  Component Value Date   WBC 6.0 10/16/2021   HGB 13.3 10/16/2021   HCT 41.2 10/16/2021   MCV 82.6 10/16/2021   PLT 290 10/16/2021    Lab Results  Component Value Date   CREATININE 1.20 10/16/2021   BUN 14 10/16/2021   NA 137 10/16/2021   K 4.4 10/16/2021   CL 107 10/16/2021   CO2 24 10/16/2021    Lab Results  Component Value Date   ALT  52 (H) 10/16/2021   AST 53 (H) 10/16/2021   ALKPHOS 66 10/16/2021   BILITOT 0.7 10/16/2021   INR 1.07 08/02/2014     Assessment: reestablishing care for HIV.  No history of resistance mutations and so will start him on Biktarvy today and get his medications through [facility] for him to pick up here in the meantime.  Will check his labs at his next visit.   Food provided due to food insecurity.   He is establishing with [facility].  Will get him into psychiatry.  I will consider starting him on Abilify which he was on before if he is unable to get into care soon.   Will check his Hgb A1c next visit and consider treatmentt with metformin.  He will need to get into primary care at some point to help.    Plan: 1)  Biktarvy provided 2) rtc in 3 weeks and will get his Biktarvy refill here in clinic.

## 2021-11-01 NOTE — Progress Notes (Signed)
Patient's cumulative genotype clear of any drug resistance; will restart patient on Biktarvy. Previously on Prezcobix/Descovy but has been off of medicine for a long time. Will have medicine sent here from [facility]. Provided 3 weeks of samples today; he can pick up his next fill when he follows up with Dr. Luciana Axe in 3 weeks.  Medication Samples have been provided to the patient.  Drug name: Biktarvy        Strength: 50/200/25 mg       Qty: 21   LOT: [id number]   Exp.Date: 9/25  Dosing instructions: Take one tablet by mouth once daily  The patient has been instructed regarding the correct time, dose, and frequency of taking this medication, including desired effects and most common side effects.   Margarite Gouge, PharmD, CPP Clinical Pharmacist Practitioner Infectious Diseases Clinical Pharmacist [facility] for Infectious Disease

## 2021-11-04 ENCOUNTER — Other Ambulatory Visit (HOSPITAL_COMMUNITY): Payer: Self-pay

## 2021-11-04 DIAGNOSIS — Z711 Person with feared health complaint in whom no diagnosis is made: Secondary | ICD-10-CM | POA: Diagnosis present

## 2021-11-04 DIAGNOSIS — Z21 Asymptomatic human immunodeficiency virus [HIV] infection status: Secondary | ICD-10-CM | POA: Insufficient documentation

## 2021-11-05 ENCOUNTER — Telehealth: Payer: Self-pay

## 2021-11-05 ENCOUNTER — Encounter (HOSPITAL_COMMUNITY): Payer: Self-pay | Admitting: Emergency Medicine

## 2021-11-05 ENCOUNTER — Other Ambulatory Visit: Payer: Self-pay

## 2021-11-05 ENCOUNTER — Emergency Department (HOSPITAL_COMMUNITY)
Admission: EM | Admit: 2021-11-05 | Discharge: 2021-11-05 | Disposition: A | Discharge status: Home / Self Care | Payer: Medicaid Other | Attending: Emergency Medicine | Admitting: Emergency Medicine

## 2021-11-05 DIAGNOSIS — Z711 Person with feared health complaint in whom no diagnosis is made: Secondary | ICD-10-CM

## 2021-11-05 LAB — COMPREHENSIVE METABOLIC PANEL
ALT: 44 U/L (ref 0–44)
AST: 48 U/L — ABNORMAL HIGH (ref 15–41)
Albumin: 3.1 g/dL — ABNORMAL LOW (ref 3.5–5.0)
Alkaline Phosphatase: 65 U/L (ref 38–126)
Anion gap: 5 (ref 5–15)
BUN: 11 mg/dL (ref 6–20)
CO2: 28 mmol/L (ref 22–32)
Calcium: 8.9 mg/dL (ref 8.9–10.3)
Chloride: 104 mmol/L (ref 98–111)
Creatinine, Ser: 1.12 mg/dL (ref 0.61–1.24)
GFR, Estimated: >60 mL/min (ref >60)
Glucose, Bld: 71 mg/dL (ref 70–99)
Potassium: 3.6 mmol/L (ref 3.5–5.1)
Sodium: 137 mmol/L (ref 135–145)
Total Bilirubin: 0.5 mg/dL (ref 0.3–1.2)
Total Protein: 7.6 g/dL (ref 6.5–8.1)

## 2021-11-05 LAB — CBC WITH DIFFERENTIAL/PLATELET
Abs Immature Granulocytes: 0.02 10*3/uL (ref 0.00–0.07)
Basophils Absolute: 0 10*3/uL (ref 0.0–0.1)
Basophils Relative: 1 %
Eosinophils Absolute: 0.1 10*3/uL (ref 0.0–0.5)
Eosinophils Relative: 2 %
HCT: 40.3 % (ref 39.0–52.0)
Hemoglobin: 13 g/dL (ref 13.0–17.0)
Immature Granulocytes: 0 %
Lymphocytes Relative: 38 %
Lymphs Abs: 2.4 10*3/uL (ref 0.7–4.0)
MCH: 26.4 pg (ref 26.0–34.0)
MCHC: 32.3 g/dL (ref 30.0–36.0)
MCV: 81.9 fL (ref 80.0–100.0)
Monocytes Absolute: 0.6 10*3/uL (ref 0.1–1.0)
Monocytes Relative: 10 %
Neutro Abs: 3.2 10*3/uL (ref 1.7–7.7)
Neutrophils Relative %: 49 %
Platelets: 290 10*3/uL (ref 150–400)
RBC: 4.92 MIL/uL (ref 4.22–5.81)
RDW: 16 % — ABNORMAL HIGH (ref 11.5–15.5)
WBC: 6.3 10*3/uL (ref 4.0–10.5)
nRBC: 0 % (ref 0.0–0.2)

## 2021-11-05 LAB — CBG MONITORING, ED
Glucose-Capillary: 69 mg/dL — ABNORMAL LOW (ref 70–99)
Glucose-Capillary: 83 mg/dL (ref 70–99)

## 2021-11-05 NOTE — ED Provider Notes (Signed)
[name] EMERGENCY DEPARTMENT Provider Note   CSN: [id number] Arrival date & time: 11/04/21  2358     History  Chief Complaint  Patient presents with   Feels like Low sugar    Banner Huckaba is a 31 y.o. male.  HPI     This is a 31 year old male who presents with concerns for low blood pressure.  He keeps his eyes closed during my interview.  He will not answer questions fully but will answer yes or no questions.  He states that he felt generally weak and felt like his blood sugar was low.  He does not take any diabetes medication.  Denies nausea, vomiting, chest pain, shortness of breath, fevers.  Per EMS report, CBG 114.  Level 5 caveat for being uncooperative.  Home Medications Prior to Admission medications   Medication Sig Start Date End Date Taking? Authorizing Provider  bictegravir-emtricitabine-tenofovir AF (BIKTARVY) 50-200-25 MG TABS tablet Take 1 tablet by mouth daily. 11/01/21   Comer, Belia Heman, MD  bictegravir-emtricitabine-tenofovir AF (BIKTARVY) 50-200-25 MG TABS tablet Take 1 tablet by mouth daily for 21 days. 11/01/21 11/22/21  Gardiner Barefoot, MD  ibuprofen (ADVIL) 600 MG tablet Take 1 tablet (600 mg total) by mouth every 6 (six) hours as needed. Patient not taking: Reported on 11/01/2021 06/30/21   Gwyneth Sprout, MD      Allergies    Tenofovir disoproxil and Kiwi extract    Review of Systems   Review of Systems  Constitutional:  Positive for fatigue. Negative for fever.   Physical Exam Updated Vital Signs BP 119/89   Pulse 82   Temp 98 F (36.7 C)   Resp 20   Ht 1.753 m (5\' 9" )   Wt 69.4 kg   SpO2 97%   BMI 22.59 kg/m  Physical Exam Vitals and nursing note reviewed.  Constitutional:      Appearance: He is well-developed.     Comments: ABCs intact, eyes closed with sunglasses on  HENT:     Head: Normocephalic and atraumatic.     Mouth/Throat:     Mouth: Mucous membranes are moist.  Eyes:     Pupils: Pupils are  equal, round, and reactive to light.  Cardiovascular:     Rate and Rhythm: Normal rate and regular rhythm.     Heart sounds: Normal heart sounds.  Pulmonary:     Effort: Pulmonary effort is normal. No respiratory distress.     Breath sounds: Normal breath sounds. No wheezing.  Abdominal:     Palpations: Abdomen is soft.     Tenderness: There is no abdominal tenderness.  Musculoskeletal:     Cervical back: Neck supple.  Lymphadenopathy:     Cervical: No cervical adenopathy.  Skin:    General: Skin is warm and dry.  Neurological:     Mental Status: He is alert and oriented to person, place, and time.  Psychiatric:     Comments: Uncooperative    ED Results / Procedures / Treatments   Labs (all labs ordered are listed, but only abnormal results are displayed) Labs Reviewed  COMPREHENSIVE METABOLIC PANEL - Abnormal; Notable for the following components:      Result Value   Albumin 3.1 (*)    AST 48 (*)    All other components within normal limits  CBC WITH DIFFERENTIAL/PLATELET - Abnormal; Notable for the following components:   RDW 16.0 (*)    All other components within normal limits  CBG MONITORING, ED - Abnormal;  Notable for the following components:   Glucose-Capillary 69 (*)    All other components within normal limits  CBG MONITORING, ED    EKG None  Radiology No results found.  Procedures Procedures    Medications Ordered in ED Medications - No data to display  ED Course/ Medical Decision Making/ A&P                           Medical Decision Making  This patient presents to the ED for concern of low blood sugar, this involves an extensive number of treatment options, and is a complaint that carries with it a high risk of complications and morbidity.  I considered the following differential and admission for this acute, potentially life threatening condition.  The differential diagnosis includes malnutrition, low blood sugar, generalized fatigue, systemic  illness such as viral illness  MDM:     This is a 31 year old male who presents with concerns for low blood sugar.  He is well-known to our ER.  He is fairly uncooperative on my history taking.  He denies any physical complaints but feels fatigued and like "my blood sugar may be low."  Does not take any diabetes medications.  Initial blood sugar 69.  Repeat blood sugars 71 and 83 respectively.  EKG without acute ischemic changes.  Lab work is unremarkable including CBC and BMP.  Patient did not require any intervention.  He is severely uncooperative on my evaluation.  Feel like he has been appropriately medically screened.  Doubt acute emergent process.  (Labs, imaging, consults)  Labs: I Ordered, and personally interpreted labs.  The pertinent results include: CBC, BMP  Imaging Studies ordered: I ordered imaging studies including none I independently visualized and interpreted imaging. I agree with the radiologist interpretation  Additional history obtained from chart review.  External records from outside source obtained and reviewed including prior evaluations  Cardiac Monitoring: The patient was maintained on a cardiac monitor.  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   I have reviewed the patients home  medicines and have made adjustments as needed  Problem List / ED Course: Problem List Items Addressed This Visit   None Visit Diagnoses     Worried well    -  Primary                   Final Clinical Impression(s) / ED Diagnoses Final diagnoses:  Worried well    Rx / DC Orders ED Discharge Orders     None         Catharine Kettlewell, Mayer Masker, MD 11/05/21 0505

## 2021-11-05 NOTE — Discharge Instructions (Signed)
You were seen today with concerns for low blood sugar.  Your blood sugar today is reassuring.  Make sure that you are eating frequent small meals.

## 2021-11-05 NOTE — ED Triage Notes (Signed)
Patient arrived with EMS from a bus stop , called the ambulance because he feels like his sugar is low , CBG=114 by EMS , alert and oriented/respirations unlabored.

## 2021-11-05 NOTE — ED Notes (Signed)
Patient given juice and sandwich

## 2021-11-05 NOTE — Telephone Encounter (Signed)
[facility] Patient Advocate Encounter ° °Patient's medications have been couriered to [facility] from [facility] and will be picked up . ° °Maanya Hippert , CPhT °Specialty Pharmacy Patient Advocate °[facility] °Phone: [phone] °Fax:  [phone]  °

## 2021-11-05 NOTE — ED Provider Triage Note (Signed)
Emergency Medicine Provider Triage Evaluation Note  Blake Chambers , a 31 y.o. male  was evaluated in triage.  Pt complains of not feeling well x 3 days with generalized weakness, intermittent lightheadedness, and concern for blood sugar dropping. States he has been sleeping a lot with fatigue. Denies syncope, chest pain, dyspnea, or vomiting. CBG 114 w/ ems  Review of Systems  Per above  Physical Exam  BP 112/77 (BP Location: Left Arm)   Pulse 88   Temp 98 F (36.7 C)   Resp 18   SpO2 97%  Gen:   Awake, no distress   Resp:  Normal effort  MSK:   Moves extremities without difficulty   Medical Decision Making  Medically screening exam initiated at 12:17 AM.  Appropriate orders placed.  Blake Chambers was informed that the remainder of the evaluation will be completed by another provider, this initial triage assessment does not replace that evaluation, and the importance of remaining in the ED until their evaluation is complete.  CBG on arrival 76- patient given crackers in triage. Will check labs & EKG  Generalized weakness.    Blake Chambers, [place] 11/05/21 0019

## 2021-11-05 NOTE — ED Notes (Signed)
Patient verbalizes understanding of discharge instructions. Opportunity for questioning and answers were provided. Armband removed by staff, pt discharged from ED. Pt provided with bus pass and sandwich. Pt ambulatory to ED waiting room.

## 2021-11-13 ENCOUNTER — Other Ambulatory Visit: Payer: Self-pay

## 2021-11-13 ENCOUNTER — Emergency Department (HOSPITAL_COMMUNITY)
Admission: EM | Admit: 2021-11-13 | Discharge: 2021-11-13 | Disposition: A | Discharge status: Home / Self Care | Payer: Medicaid Other | Attending: Student | Admitting: Student

## 2021-11-13 ENCOUNTER — Encounter (HOSPITAL_COMMUNITY): Payer: Self-pay

## 2021-11-13 DIAGNOSIS — M79605 Pain in left leg: Secondary | ICD-10-CM | POA: Diagnosis not present

## 2021-11-13 DIAGNOSIS — M79604 Pain in right leg: Secondary | ICD-10-CM

## 2021-11-13 DIAGNOSIS — Z5900 Homelessness unspecified: Secondary | ICD-10-CM | POA: Insufficient documentation

## 2021-11-13 DIAGNOSIS — Z21 Asymptomatic human immunodeficiency virus [HIV] infection status: Secondary | ICD-10-CM | POA: Diagnosis not present

## 2021-11-13 DIAGNOSIS — M79606 Pain in leg, unspecified: Secondary | ICD-10-CM | POA: Diagnosis present

## 2021-11-13 MED ORDER — ACETAMINOPHEN 325 MG PO TABS
650.0000 mg | ORAL_TABLET | Freq: Once | ORAL | Status: AC
  Administered 2021-11-13: 650 mg via ORAL
  Filled 2021-11-13: qty 2

## 2021-11-13 NOTE — ED Provider Notes (Signed)
[facility] DEPT Provider Note   CSN: [id number] Arrival date & time: 11/13/21  0155    History  Chief Complaint  Patient presents with   Leg Pain    Blake Chambers is a 31 y.o. male well-known to this department for frequent ED visits for secondary gain.  Patient with history of homelessness, HIV, polysubstance abuse.  States that he has been walking for quite a bit of time this evening and his legs are sore.  Denies any other acute concerns.  Also requesting rest while in the emergency department.  I personally reviewed his medical records.  As the above he has history of anxiety.  Questionable compliance with prescribed Biktarvy.  HPI     Home Medications Prior to Admission medications   Medication Sig Start Date End Date Taking? Authorizing Provider  bictegravir-emtricitabine-tenofovir AF (BIKTARVY) 50-200-25 MG TABS tablet Take 1 tablet by mouth daily. 11/01/21   Comer, Okey Regal, MD  bictegravir-emtricitabine-tenofovir AF (BIKTARVY) 50-200-25 MG TABS tablet Take 1 tablet by mouth daily for 21 days. 11/01/21 11/22/21  Thayer Headings, MD  ibuprofen (ADVIL) 600 MG tablet Take 1 tablet (600 mg total) by mouth every 6 (six) hours as needed. Patient not taking: Reported on 11/01/2021 06/30/21   Blanchie Dessert, MD      Allergies    Tenofovir disoproxil and Kiwi extract    Review of Systems   Review of Systems  Musculoskeletal:  Positive for myalgias.   Physical Exam Updated Vital Signs BP 111/79 (BP Location: Right Arm)   Pulse 85   Temp 97.9 F (36.6 C) (Oral)   Resp 18   SpO2 98%  Physical Exam Vitals and nursing note reviewed.  Constitutional:      Appearance: He is not ill-appearing or toxic-appearing.  HENT:     Head: Normocephalic and atraumatic.     Mouth/Throat:     Mouth: Mucous membranes are moist.     Pharynx: No oropharyngeal exudate or posterior oropharyngeal erythema.  Eyes:     General:        Right eye: No discharge.         Left eye: No discharge.     Extraocular Movements: Extraocular movements intact.     Conjunctiva/sclera: Conjunctivae normal.     Pupils: Pupils are equal, round, and reactive to light.  Cardiovascular:     Rate and Rhythm: Normal rate and regular rhythm.     Pulses: Normal pulses.     Heart sounds: Normal heart sounds. No murmur heard. Pulmonary:     Effort: Pulmonary effort is normal. No respiratory distress.     Breath sounds: Normal breath sounds. No wheezing or rales.  Abdominal:     General: Bowel sounds are normal. There is no distension.     Palpations: Abdomen is soft.     Tenderness: There is no abdominal tenderness. There is no right CVA tenderness, left CVA tenderness, guarding or rebound.  Musculoskeletal:        General: No deformity.     Cervical back: Neck supple.     Right lower leg: No edema.     Left lower leg: No edema.  Skin:    General: Skin is warm and dry.     Capillary Refill: Capillary refill takes less than 2 seconds.  Neurological:     General: No focal deficit present.     Mental Status: He is alert and oriented to person, place, and time. Mental status is at baseline.  Psychiatric:        Mood and Affect: Mood normal.    ED Results / Procedures / Treatments   Labs (all labs ordered are listed, but only abnormal results are displayed) Labs Reviewed - No data to display  EKG None  Radiology No results found.  Procedures Procedures    Medications Ordered in ED Medications - No data to display  ED Course/ Medical Decision Making/ A&P                           Medical Decision Making 31 year old male who presents with concern for left leg pain in context of homelessness, walking frequently, and frequent usage of the emergency department for secondary gain.  Vital signs are normal and intake.  Cardiopulmonary and abdominal exams are benign.  Patient without evidence of injury to the left lower extremity, normal neurovascular  status.  Risk OTC drugs.   Suspect muscle soreness from walking, additionally patient admits to using this visit in an effort to find a safe place to rest.  Tylenol offered for muscle soreness in the legs.  No further work-up warranted in the ER at this time given normal hemodynamics and well-appearing patient.  Will allow him to rest in the emergency department while the ED bed is not needed.  Will then be discharged.  Landry voiced understanding of his medical evaluation and treatment plan. Each of his questions answered to his expressed satisfaction.  Return precautions were given.  Patient is well-appearing, stable, and was discharged in good condition.  This chart was dictated using voice recognition software, Dragon. Despite the best efforts of this provider to proofread and correct errors, errors may still occur which can change documentation meaning.        Final Clinical Impression(s) / ED Diagnoses Final diagnoses:  None    Rx / DC Orders ED Discharge Orders     None         Aura Dials 11/13/21 0509    Teressa Lower, MD 11/13/21 [phone]

## 2021-11-13 NOTE — ED Triage Notes (Signed)
Pt Blake Chambers, pt has been walking for the past few hours and is complaining of left leg pain.

## 2021-11-22 ENCOUNTER — Other Ambulatory Visit (HOSPITAL_COMMUNITY): Payer: Self-pay

## 2021-11-25 ENCOUNTER — Other Ambulatory Visit (HOSPITAL_COMMUNITY): Payer: Self-pay

## 2021-12-10 ENCOUNTER — Ambulatory Visit: Payer: Medicaid Other | Admitting: Infectious Diseases

## 2021-12-12 ENCOUNTER — Other Ambulatory Visit (HOSPITAL_COMMUNITY): Payer: Self-pay

## 2021-12-13 ENCOUNTER — Telehealth: Payer: Self-pay

## 2021-12-13 NOTE — Telephone Encounter (Signed)
[facility] Patient Advocate Encounter  Patient's medications have been couriered to [facility] from [facility] and will be picked up 12/16/21.  Clearance Coots , CPhT Specialty Pharmacy Patient [facility] Phone: [phone] Fax:  [phone]

## 2021-12-20 ENCOUNTER — Ambulatory Visit (HOSPITAL_COMMUNITY)
Admission: EM | Admit: 2021-12-20 | Discharge: 2021-12-21 | Disposition: A | Discharge status: Home / Self Care | Payer: Medicaid Other | Attending: Urology | Admitting: Urology

## 2021-12-20 DIAGNOSIS — F319 Bipolar disorder, unspecified: Secondary | ICD-10-CM | POA: Insufficient documentation

## 2021-12-20 DIAGNOSIS — F1190 Opioid use, unspecified, uncomplicated: Secondary | ICD-10-CM | POA: Insufficient documentation

## 2021-12-20 DIAGNOSIS — F1911 Other psychoactive substance abuse, in remission: Secondary | ICD-10-CM | POA: Insufficient documentation

## 2021-12-20 DIAGNOSIS — F1290 Cannabis use, unspecified, uncomplicated: Secondary | ICD-10-CM | POA: Insufficient documentation

## 2021-12-20 DIAGNOSIS — F419 Anxiety disorder, unspecified: Secondary | ICD-10-CM | POA: Insufficient documentation

## 2021-12-20 DIAGNOSIS — Z5900 Homelessness unspecified: Secondary | ICD-10-CM | POA: Insufficient documentation

## 2021-12-20 NOTE — Progress Notes (Signed)
   12/20/21 2225  [facility] Triage Screening (Walk-ins at [facility] only)  How Did You Hear About [place]? Self  What Is the Reason for Your Visit/Call Today? Pt reports he has been diagnosed with bipolar disorder, HIV, and diabetes. He says he is not taking any of his medications because he is homeless on the street and his medications are in a place be cannot easily access. Pt describes his mood as "up and down". He says he has not been sleeping well because he does not have a safe place to sleep. He reports epsiodes of passive suicidal ideation with no plan or intent. He denies current suicidal ideation. He denies homicidal ideation. He denies auditory or visual hallucinations. Pt says he does feel paranoid at times. During assessment, he focuses on other people who belittle him and make his life difficult. He says he has a history of using marijuana and methaphetamines but denies recent use. When asked what kind of help he is seeking, Pt says he would like food, clothing, and a place to sleep.  How Long Has This Been Causing You Problems? 1-6 months  Have You Recently Had Any Thoughts About Hurting Yourself? No  Are You Planning to Commit Suicide/Harm Yourself At This time? No  Have you Recently Had Thoughts About Hurting Someone Karolee Ohs? No  Are You Planning To Harm Someone At This Time? No  Are you currently experiencing any auditory, visual or other hallucinations? No  Have You Used Any Alcohol or Drugs in the Past 24 Hours? No  Do you have any current medical co-morbidities that require immediate attention? No  Clinician description of patient physical appearance/behavior: Pt is somewhat disheveled and malodorous. He alert and oriented x4. Pt speaks in a clear tone, at moderate volume and normal pace. Motor behavior appears normal. Eye contact is good. Pt's mood is depressed and affect is congruent with mood. Thought process is coherent and relevant. There is no indication Pt is currently responding to internal  stimuli or experiencing delusional thought content. He is cooperative.  What Do You Feel Would Help You the Most Today? Housing Assistance;Food Assistance  If access to [facility] was not available, would you have sought care in the Emergency Department? No  Determination of Need Routine (7 days)  Options For Referral Outpatient Therapy;Medication Management

## 2021-12-21 ENCOUNTER — Other Ambulatory Visit: Payer: Self-pay

## 2021-12-21 ENCOUNTER — Encounter (HOSPITAL_COMMUNITY): Payer: Self-pay | Admitting: Emergency Medicine

## 2021-12-21 ENCOUNTER — Emergency Department (HOSPITAL_COMMUNITY)
Admission: EM | Admit: 2021-12-21 | Discharge: 2021-12-21 | Disposition: A | Discharge status: Home / Self Care | Payer: Medicaid Other | Attending: Emergency Medicine | Admitting: Emergency Medicine

## 2021-12-21 DIAGNOSIS — Z21 Asymptomatic human immunodeficiency virus [HIV] infection status: Secondary | ICD-10-CM | POA: Diagnosis not present

## 2021-12-21 DIAGNOSIS — L0291 Cutaneous abscess, unspecified: Secondary | ICD-10-CM

## 2021-12-21 DIAGNOSIS — L0201 Cutaneous abscess of face: Secondary | ICD-10-CM | POA: Insufficient documentation

## 2021-12-21 LAB — CBC
HCT: 44.3 % (ref 39.0–52.0)
Hemoglobin: 14.2 g/dL (ref 13.0–17.0)
MCH: 26.2 pg (ref 26.0–34.0)
MCHC: 32.1 g/dL (ref 30.0–36.0)
MCV: 81.6 fL (ref 80.0–100.0)
Platelets: 262 10*3/uL (ref 150–400)
RBC: 5.43 MIL/uL (ref 4.22–5.81)
RDW: 15.9 % — ABNORMAL HIGH (ref 11.5–15.5)
WBC: 5.6 10*3/uL (ref 4.0–10.5)
nRBC: 0 % (ref 0.0–0.2)

## 2021-12-21 LAB — BASIC METABOLIC PANEL
Anion gap: 7 (ref 5–15)
BUN: 9 mg/dL (ref 6–20)
CO2: 24 mmol/L (ref 22–32)
Calcium: 8.5 mg/dL — ABNORMAL LOW (ref 8.9–10.3)
Chloride: 106 mmol/L (ref 98–111)
Creatinine, Ser: 1.11 mg/dL (ref 0.61–1.24)
GFR, Estimated: >60 mL/min (ref >60)
Glucose, Bld: 84 mg/dL (ref 70–99)
Potassium: 4 mmol/L (ref 3.5–5.1)
Sodium: 137 mmol/L (ref 135–145)

## 2021-12-21 MED ORDER — BICTEGRAVIR-EMTRICITAB-TENOFOV 50-200-25 MG PO TABS: 1.0000 | ORAL_TABLET | Freq: Every day | ORAL | 11 refills | Status: DC

## 2021-12-21 NOTE — ED Triage Notes (Signed)
Pt presents with abscess to the left side of his face.

## 2021-12-21 NOTE — ED Provider Notes (Signed)
[name] EMERGENCY DEPARTMENT Provider Note   CSN: [id number] Arrival date & time: 12/21/21  0324     History  Chief Complaint  Patient presents with   Abscess    Blake Chambers is a 31 y.o. male.  HPI 31 year old male with a history of anxiety, HIV on Biktarvy, bipolar disorder, syphilis, osteomyelitis of the finger of the left hand presents to the ER with complaint of a abscess to the left side of his face.  Patient reports this has been there for about 2 weeks.  Denies any active drainage.  He reports increasing "irritation" to the area which got him concerned and brought him to the ER.  He also reports a lesion on the right side of his face but that has been there for "years".  He denies any dental pain.  No known fevers.  He reports he has not taken his Biktarvy because his "pill bottle went missing".  Per chart review, follows with Dr. Earle Gell with infectious disease.    Home Medications Prior to Admission medications   Medication Sig Start Date End Date Taking? Authorizing Provider  bictegravir-emtricitabine-tenofovir AF (BIKTARVY) 50-200-25 MG TABS tablet Take 1 tablet by mouth daily. 12/21/21   Mare Ferrari, PA-C  ibuprofen (ADVIL) 600 MG tablet Take 1 tablet (600 mg total) by mouth every 6 (six) hours as needed. Patient not taking: Reported on 11/01/2021 06/30/21   Gwyneth Sprout, MD      Allergies    Tenofovir disoproxil and Kiwi extract    Review of Systems   Review of Systems Ten systems reviewed and are negative for acute change, except as noted in the HPI.   Physical Exam Updated Vital Signs BP 101/66   Pulse 71   Temp 97.9 F (36.6 C) (Oral)   Resp 18   SpO2 99%  Physical Exam Vitals and nursing note reviewed.  Constitutional:      General: He is not in acute distress.    Appearance: He is well-developed.  HENT:     Head: Normocephalic and atraumatic.     Mouth/Throat:     Comments: Poor dentition throughout, uvula midline, no  visible dental abscess, full range of motion of jaw, no sublingual/submandibular swelling Eyes:     Conjunctiva/sclera: Conjunctivae normal.  Cardiovascular:     Rate and Rhythm: Normal rate and regular rhythm.     Heart sounds: No murmur heard. Pulmonary:     Effort: Pulmonary effort is normal. No respiratory distress.     Breath sounds: Normal breath sounds.  Abdominal:     Palpations: Abdomen is soft.     Tenderness: There is no abdominal tenderness.  Musculoskeletal:        General: No swelling.     Cervical back: Neck supple.  Skin:    General: Skin is warm and dry.     Capillary Refill: Capillary refill takes less than 2 seconds.     Findings: Lesion present.     Comments: 2 cm x 3 cm lesion to the left cheek, indurated, no warmth, no surrounding erythema, no drainage, no fluctuance  Neurological:     Mental Status: He is alert.  Psychiatric:        Mood and Affect: Mood normal.        ED Results / Procedures / Treatments   Labs (all labs ordered are listed, but only abnormal results are displayed) Labs Reviewed  CBC - Abnormal; Notable for the following components:  Result Value   RDW 15.9 (*)    All other components within normal limits  BASIC METABOLIC PANEL - Abnormal; Notable for the following components:   Calcium 8.5 (*)    All other components within normal limits  T-HELPER CELLS (CD4) COUNT (NOT AT [facility])    EKG None  Radiology No results found.  Procedures Procedures    Medications Ordered in ED Medications - No data to display  ED Course/ Medical Decision Making/ A&P                           Medical Decision Making  31 year old male presenting with concerns for abscess to left side of the face.  History of HIV, noncompliant with Biktarvy currently.  Chart review performed with review of records in Care Everywhere.  Abscess on the left cheek does not appear to be acutely infected, is indurated, no fluctuance or drainage.  No indication  for I&D.  No signs of acute cellulitis.  Patient has poor dentition throughout but no evidence of dental abscess.  He has no trismus.  He has not been compliant with his Biktarvy, ordered basic labs and CD4 count.  CBC without leukocytosis, BMP largely unremarkable.  CD4 count pending.  No indication for antibiotic treatment at this time.  Biktarvy refilled.  Recommended follow-up with Dr. Luciana Axe, may require dermatology referral.  Discussed return precautions.  Considered admission but no indication at this time. Stable for discharge      ED Diagnoses Final diagnoses:  Abscess    Rx / DC Orders ED Discharge Orders          Ordered    bictegravir-emtricitabine-tenofovir AF (BIKTARVY) 50-200-25 MG TABS tablet  Daily        12/21/21 0917              Mare Ferrari, PA-C 12/21/21 0926    Franne Forts, DO 12/27/21 0041

## 2021-12-21 NOTE — ED Provider Notes (Addendum)
Behavioral [facility] Urgent [facility] Medical Screening Exam  Patient Name: Blake Chambers MRN: [id number] Date of Evaluation: 12/21/21 Chief Complaint:   Diagnosis:  Final diagnoses:  Homelessness    History of Present illness: Blake Chambers is a 31 y.o. male with psychiatric history of substance use disorder, bipolar disorder, anxiety, and homelessness. Patient presented voluntary to [facility] seeking outpatient mental health resources and shelter.    Patient was assessed by this NP. On assessment, he is noted to be sleeping in a chair. Patient is hard to wake-up, he is drowsy and needed several prompts throughout assessment to stay awake. He is calm oriented and answered questions appropriately. No evidence that patient was responding to any internal/external stimuli.   Patient reports that he is currently homeless. He says he needs a therapist, place to sleep, food and transportation to a friend's house in order to retrieve his medications. He is unsure of the names of his medications and name of provider that prescribes his medications.  He denies any acute mental health or medical complaint. He denies SI, HI, and AVH. He admits to illicit substance use. He report smoking marijuana and meth. He says he last used illicit substance about 1 week ago.      Psychiatric Specialty Exam  Presentation  General Appearance:Fairly Groomed  Eye Contact:Minimal  Speech:Clear and Coherent  Speech Volume:Normal  Handedness:Right   Mood and Affect  Mood:Euthymic  Affect:Congruent   Thought Process  Thought Processes:Coherent  Descriptions of Associations:Intact  Orientation:Full (Time, Place and Person)  Thought Content:WDL    Hallucinations:None  Ideas of Reference:None  Suicidal Thoughts:No  Homicidal Thoughts:No   Sensorium  Memory:Immediate Good; Recent Fair; Remote Fair  Judgment:Fair  Insight:Fair   Executive Functions  Concentration:Fair  Attention  Span:Poor  Recall:Fair  Fund of Knowledge:Fair  Language:Fair   Psychomotor Activity  Psychomotor Activity:Normal   Assets  Assets:Communication Skills; Desire for Improvement; Physical Health   Sleep  Sleep:Poor  Number of hours: 4   No data recorded  Physical Exam: Physical Exam Vitals and nursing note reviewed.  Constitutional:      General: He is not in acute distress.    Appearance: He is well-developed. He is not ill-appearing.  HENT:     Head: Normocephalic and atraumatic.  Eyes:     Conjunctiva/sclera: Conjunctivae normal.  Cardiovascular:     Rate and Rhythm: Normal rate.     Heart sounds: No murmur heard. Pulmonary:     Effort: Pulmonary effort is normal. No respiratory distress.     Breath sounds: Normal breath sounds.  Abdominal:     Palpations: Abdomen is soft.     Tenderness: There is no abdominal tenderness.  Musculoskeletal:        General: No swelling.     Cervical back: Neck supple.  Skin:    General: Skin is warm and dry.     Capillary Refill: Capillary refill takes less than 2 seconds.  Neurological:     Mental Status: He is alert and oriented to person, place, and time.  Psychiatric:        Attention and Perception: Attention and perception normal.        Mood and Affect: Mood normal.        Speech: Speech normal.        Behavior: Behavior normal. Behavior is cooperative.        Thought Content: Thought content normal.        Cognition and Memory: Cognition normal.    Review of  Systems  Constitutional: Negative.   HENT: Negative.    Eyes: Negative.   Respiratory: Negative.    Cardiovascular: Negative.   Gastrointestinal: Negative.   Genitourinary: Negative.   Musculoskeletal: Negative.   Skin: Negative.   Neurological: Negative.   Endo/Heme/Allergies: Negative.   Psychiatric/Behavioral:  Positive for substance abuse.    Blood pressure 117/73, pulse 99, temperature 97.9 F (36.6 C), temperature source Oral, resp. rate 18,  SpO2 97 %. There is no height or weight on file to calculate BMI.  Musculoskeletal: Strength & Muscle Tone: within normal limits Gait & Station: normal Patient leans: Right   [facility] MSE Discharge Disposition for Follow up and Recommendations: Based on my evaluation the patient does not appear to have an emergency medical condition and can be discharged with resources and follow up care in outpatient services for Medication Management and Individual Therapy  Outpatient resources including information for Open Access provided to patient.   No evidence of imminent danger to self or others at this time. Patient does not meet criteria for psychiatric admission or IVC. Supportive therapy provided about ongoing stressors. Discussed crisis plan, callling 911/988 or going to Emergency Dept   Maricela Bo, NP 12/21/2021, 3:48 AM

## 2021-12-21 NOTE — Discharge Instructions (Addendum)
You were evaluated in the Emergency Department and after careful evaluation, we did not find any emergent condition requiring admission or further testing in the hospital.  Your lab work was overall reassuring.  Please continue to take your Biktarvy, I have refilled this for you today.  Please make sure to follow-up with Dr. Luciana Axe  your infectious disease doctor.  Your abscess did not appear infected, and there was no indication for drainage today.  You may take Tylenol or ibuprofen for inflammation.  Please return to the Emergency Department if you experience any worsening of your condition.  Thank you for allowing [place] to be a part of your care.

## 2021-12-21 NOTE — Discharge Instructions (Addendum)
  Discharge recommendations:  You are encouraged to follow up with [facility] for outpatient treatment.  Walk in/ Open Access Hours: Monday - Friday 8AM - 11AM (To see provider and therapist) - Arrive around 7 or 7:15 to have a better chance of being seen, as slots fill up.   Tuesday and Wednesday 8am - 11am (To see therapist only) Arrive around 7-7:30 am to have a better chance of being seen, as slots fill up fast.    [facility] [address] [place], [place] [phone]  Patient is to take medications as prescribed. Please see information for follow-up appointment with psychiatry and therapy. Please follow up with your primary care provider for all medical related needs.   Therapy: We recommend that patient participate in individual therapy to address mental health concerns.  Medications: The parent/guardian is to contact a medical professional and/or outpatient provider to address any new side effects that develop. Parent/guardian should update outpatient providers of any new medications and/or medication changes.   Atypical antipsychotics: If you are prescribed an atypical antipsychotic, it is recommended that your height, weight, BMI, blood pressure, fasting lipid panel, and fasting blood sugar be monitored by your outpatient providers.  Safety:  The patient should abstain from use of illicit substances/drugs and abuse of any medications. If symptoms worsen or do not continue to improve or if the patient becomes actively suicidal or homicidal then it is recommended that the patient return to the closest hospital emergency department, the [facility], or call 911 for further evaluation and treatment. National Suicide Prevention Lifeline 1-800-SUICIDE or 732-317-1564.  About 988 988 offers 24/7 access to trained crisis counselors who can help people experiencing mental health-related distress. People can call or  text 988 or chat 988lifeline.org for themselves or if they are worried about a loved one who may need crisis support.

## 2021-12-21 NOTE — ED Notes (Signed)
Discharge instructions reviewed with patient. Patient denies any questions or concerns. Pt ambulatory to lobby. 

## 2021-12-29 ENCOUNTER — Ambulatory Visit (HOSPITAL_COMMUNITY)
Admission: EM | Admit: 2021-12-29 | Discharge: 2021-12-29 | Discharge status: Absent without leave | Payer: Medicaid Other

## 2021-12-29 NOTE — BH Assessment (Signed)
Pt walked outside to smoke cigarette once called back in by security patient walked away from building and did not return. Pt left before triage.

## 2022-01-03 ENCOUNTER — Encounter (HOSPITAL_COMMUNITY): Payer: Self-pay | Admitting: Emergency Medicine

## 2022-01-03 ENCOUNTER — Other Ambulatory Visit: Payer: Self-pay

## 2022-01-03 ENCOUNTER — Emergency Department (HOSPITAL_COMMUNITY): Payer: Medicaid Other

## 2022-01-03 ENCOUNTER — Emergency Department (HOSPITAL_COMMUNITY)
Admission: EM | Admit: 2022-01-03 | Discharge: 2022-01-03 | Disposition: A | Discharge status: Home / Self Care | Payer: Medicaid Other | Attending: Emergency Medicine | Admitting: Emergency Medicine

## 2022-01-03 DIAGNOSIS — M79604 Pain in right leg: Secondary | ICD-10-CM | POA: Diagnosis not present

## 2022-01-03 DIAGNOSIS — E119 Type 2 diabetes mellitus without complications: Secondary | ICD-10-CM | POA: Insufficient documentation

## 2022-01-03 DIAGNOSIS — R42 Dizziness and giddiness: Secondary | ICD-10-CM | POA: Insufficient documentation

## 2022-01-03 DIAGNOSIS — H53149 Visual discomfort, unspecified: Secondary | ICD-10-CM | POA: Diagnosis not present

## 2022-01-03 DIAGNOSIS — M79605 Pain in left leg: Secondary | ICD-10-CM | POA: Diagnosis not present

## 2022-01-03 DIAGNOSIS — Z5900 Homelessness unspecified: Secondary | ICD-10-CM

## 2022-01-03 DIAGNOSIS — R112 Nausea with vomiting, unspecified: Secondary | ICD-10-CM | POA: Insufficient documentation

## 2022-01-03 DIAGNOSIS — Z21 Asymptomatic human immunodeficiency virus [HIV] infection status: Secondary | ICD-10-CM | POA: Insufficient documentation

## 2022-01-03 DIAGNOSIS — M79606 Pain in leg, unspecified: Secondary | ICD-10-CM | POA: Insufficient documentation

## 2022-01-03 DIAGNOSIS — R200 Anesthesia of skin: Secondary | ICD-10-CM | POA: Diagnosis not present

## 2022-01-03 DIAGNOSIS — R5383 Other fatigue: Secondary | ICD-10-CM | POA: Diagnosis present

## 2022-01-03 DIAGNOSIS — R519 Headache, unspecified: Secondary | ICD-10-CM | POA: Diagnosis not present

## 2022-01-03 DIAGNOSIS — R1013 Epigastric pain: Secondary | ICD-10-CM | POA: Diagnosis not present

## 2022-01-03 DIAGNOSIS — Z20822 Contact with and (suspected) exposure to covid-19: Secondary | ICD-10-CM | POA: Diagnosis not present

## 2022-01-03 LAB — CBC WITH DIFFERENTIAL/PLATELET
Abs Immature Granulocytes: 0.01 10*3/uL (ref 0.00–0.07)
Basophils Absolute: 0 10*3/uL (ref 0.0–0.1)
Basophils Relative: 0 %
Eosinophils Absolute: 0.1 10*3/uL (ref 0.0–0.5)
Eosinophils Relative: 1 %
HCT: 40.3 % (ref 39.0–52.0)
Hemoglobin: 13 g/dL (ref 13.0–17.0)
Immature Granulocytes: 0 %
Lymphocytes Relative: 40 %
Lymphs Abs: 2.2 10*3/uL (ref 0.7–4.0)
MCH: 26.6 pg (ref 26.0–34.0)
MCHC: 32.3 g/dL (ref 30.0–36.0)
MCV: 82.4 fL (ref 80.0–100.0)
Monocytes Absolute: 0.7 10*3/uL (ref 0.1–1.0)
Monocytes Relative: 12 %
Neutro Abs: 2.6 10*3/uL (ref 1.7–7.7)
Neutrophils Relative %: 47 %
Platelets: 294 10*3/uL (ref 150–400)
RBC: 4.89 MIL/uL (ref 4.22–5.81)
RDW: 15.9 % — ABNORMAL HIGH (ref 11.5–15.5)
WBC: 5.6 10*3/uL (ref 4.0–10.5)
nRBC: 0 % (ref 0.0–0.2)

## 2022-01-03 LAB — COMPREHENSIVE METABOLIC PANEL
ALT: 39 U/L (ref 0–44)
AST: 42 U/L — ABNORMAL HIGH (ref 15–41)
Albumin: 3 g/dL — ABNORMAL LOW (ref 3.5–5.0)
Alkaline Phosphatase: 76 U/L (ref 38–126)
Anion gap: 10 (ref 5–15)
BUN: 9 mg/dL (ref 6–20)
CO2: 25 mmol/L (ref 22–32)
Calcium: 8.6 mg/dL — ABNORMAL LOW (ref 8.9–10.3)
Chloride: 103 mmol/L (ref 98–111)
Creatinine, Ser: 1.13 mg/dL (ref 0.61–1.24)
GFR, Estimated: >60 mL/min (ref >60)
Glucose, Bld: 127 mg/dL — ABNORMAL HIGH (ref 70–99)
Potassium: 3.3 mmol/L — ABNORMAL LOW (ref 3.5–5.1)
Sodium: 138 mmol/L (ref 135–145)
Total Bilirubin: 0.6 mg/dL (ref 0.3–1.2)
Total Protein: 7.6 g/dL (ref 6.5–8.1)

## 2022-01-03 LAB — RESP PANEL BY RT-PCR (FLU A&B, COVID) ARPGX2
Influenza A by PCR: NEGATIVE
Influenza B by PCR: NEGATIVE
SARS Coronavirus 2 by RT PCR: NEGATIVE

## 2022-01-03 LAB — CBG MONITORING, ED: Glucose-Capillary: 95 mg/dL (ref 70–99)

## 2022-01-03 NOTE — ED Triage Notes (Signed)
Patient here with complaint of fatigue, states he sleeps and wakes up still feeling tired. Patient is alert, oriented, and in no apparent distress at this time.

## 2022-01-03 NOTE — Discharge Instructions (Addendum)
You were seen in the Emergency Department for your fatigue.  You did not appear to have any active infection in this visit.  We reviewed some prior notes in our system and saw that there was a prescription for Biktarvy that was available for you to pick up at [facility] last month.  We highly recommend that you follow-up with [facility] and resume your antiretroviral therapy.  If you have any new or worsening symptoms including chest pain, syncope, severe dizziness, or other bothersome symptoms please return to the emergency department.

## 2022-01-03 NOTE — ED Provider Notes (Signed)
[name] EMERGENCY DEPARTMENT Provider Note   CSN: [id number] Arrival date & time: 01/03/22  1424     History  Chief Complaint  Patient presents with   Fatigue    Blake Chambers is a 31 y.o. male.  Blake Chambers is a 31 year old male with past medical history of HIV (prescribed Biktarvy) not currently taking antiretroviral therapy diabetes who presents with multiple complaints with the most bothersome being fatigue and bilateral leg pain.  He notes these symptoms along with the others have been present for a couple weeks.  He does not complain of any exertional worsening of his leg pain or focal weakness.  He also complains of intermittent headache, photophobia, dizziness, numbness in his extremities, epigastric abdominal pain, nausea, vomiting.  He states he has not been able to get his the retroviral therapy because he is moving around a lot.  He denies any history of opportunistic infections or hospitalizations for infections in the past.  He denies any neck pain, neck stiffness, cough, shortness of breath, chest pain, fever, or chills.        Home Medications Prior to Admission medications   Medication Sig Start Date End Date Taking? Authorizing Provider  bictegravir-emtricitabine-tenofovir AF (BIKTARVY) 50-200-25 MG TABS tablet Take 1 tablet by mouth daily. 12/21/21   Mare Ferrari, PA-C  ibuprofen (ADVIL) 600 MG tablet Take 1 tablet (600 mg total) by mouth every 6 (six) hours as needed. Patient not taking: Reported on 11/01/2021 06/30/21   Gwyneth Sprout, MD      Allergies    Tenofovir disoproxil and Kiwi extract    Review of Systems   Review of Systems  Constitutional:  Positive for fatigue. Negative for fever.  Eyes:  Positive for photophobia. Negative for visual disturbance.  Respiratory:  Negative for cough and shortness of breath.   Cardiovascular:  Negative for chest pain and leg swelling.  Gastrointestinal:  Positive for abdominal pain,  nausea and vomiting. Negative for blood in stool.  Genitourinary:  Negative for dysuria and hematuria.  Musculoskeletal:  Negative for neck pain and neck stiffness.  Neurological:  Positive for dizziness and headaches. Negative for syncope and light-headedness.  Psychiatric/Behavioral:  Negative for confusion.     Physical Exam Updated Vital Signs BP 118/78 (BP Location: Left Arm)   Pulse 88   Temp 97.7 F (36.5 C) (Oral)   Resp 18   SpO2 99%  Physical Exam Vitals reviewed.  Constitutional:      General: He is awake. He is not in acute distress.    Appearance: He is well-developed and normal weight.  HENT:     Head: Normocephalic and atraumatic.     Mouth/Throat:     Mouth: Mucous membranes are moist.     Pharynx: Oropharynx is clear. No oropharyngeal exudate.  Eyes:     Extraocular Movements: Extraocular movements intact.     Conjunctiva/sclera: Conjunctivae normal.     Pupils: Pupils are equal, round, and reactive to light.  Cardiovascular:     Rate and Rhythm: Normal rate and regular rhythm.  Pulmonary:     Effort: Pulmonary effort is normal.     Breath sounds: Normal breath sounds.  Abdominal:     General: Abdomen is flat. There is no distension.     Palpations: Abdomen is soft.     Tenderness: There is abdominal tenderness in the epigastric area.  Musculoskeletal:     Cervical back: No rigidity or tenderness.     Right lower leg: No  edema.     Left lower leg: No edema.  Neurological:     General: No focal deficit present.     Mental Status: He is oriented to person, place, and time.     Cranial Nerves: Cranial nerves 2-12 are intact.  Psychiatric:        Behavior: Behavior is cooperative.     ED Results / Procedures / Treatments   Labs (all labs ordered are listed, but only abnormal results are displayed) Labs Reviewed  COMPREHENSIVE METABOLIC PANEL - Abnormal; Notable for the following components:      Result Value   Potassium 3.3 (*)    Glucose, Bld  127 (*)    Calcium 8.6 (*)    Albumin 3.0 (*)    AST 42 (*)    All other components within normal limits  CBC WITH DIFFERENTIAL/PLATELET - Abnormal; Notable for the following components:   RDW 15.9 (*)    All other components within normal limits  RESP PANEL BY RT-PCR (FLU A&B, COVID) ARPGX2  CBG MONITORING, ED    EKG None  Radiology DG Chest 2 View  Result Date: 01/03/2022 CLINICAL DATA:  Shortness of breath EXAM: CHEST - 2 VIEW COMPARISON:  08/03/2021 FINDINGS: The heart size and mediastinal contours are within normal limits. Both lungs are clear. The visualized skeletal structures are unremarkable. IMPRESSION: No active cardiopulmonary disease. Electronically Signed   By: Jasmine Pang M.D.   On: 01/03/2022 19:22    Procedures Procedures    Medications Ordered in ED Medications - No data to display  ED Course/ Medical Decision Making/ A&P                           Medical Decision Making Blake Chambers is a 31 year old male with a past medical history of HIV and diabetes who presents with multiple complaints including headache, abdominal pain, leg pain, fatigue, and nausea/vomiting for the past few weeks.  He has not been on any HIV antiretrovirals for several months.  Initial work-up of CBC, CMP, respiratory panel, and chest x-ray showed a low potassium of 3.3 and a slightly high AST of 42 but no leukocytosis or leukopenia/lymphopenia or neutropenia.  In the ED he was tired appearing but remained afebrile, had no meningismus, had no decreased breath sounds on exam, and was eating and drinking well.  Last CD4 count 11 months ago was 620.  Review of prior notes shows that he has a prescription for Biktarvy that was available for him to pick up last month at [facility] he had not picked up.  We discussed the importance of resuming antiretroviral therapy and following up with [facility] on discharge.  Patient is agreeable to discharge with plan to follow-up with [facility] to resume antiretroviral  therapy.  Problems Addressed: Fatigue, unspecified type: acute illness or injury Homelessness: chronic illness or injury  Amount and/or Complexity of Data Reviewed Labs: ordered. Decision-making details documented in ED Course. Radiology: ordered and independent interpretation performed. Decision-making details documented in ED Course.          Final Clinical Impression(s) / ED Diagnoses Final diagnoses:  Homelessness  Fatigue, unspecified type    Rx / DC Orders ED Discharge Orders     None         Rocky Morel, DO 01/03/22 2033    Milagros Loll, MD 01/03/22 2134

## 2022-01-03 NOTE — ED Provider Triage Note (Signed)
Emergency Medicine Provider Triage Evaluation Note  Blake Chambers , a 31 y.o. male  was evaluated in triage.  Pt complains of increased fatigue.  Patient reports that recently he has been sleeping well wakes up still feeling tired.  Patient reports that his body feels "weird."  Patient is unable to elaborate further.  Denies any recent falls or traumatic injuries.  Patient reports that he has not used any illicit drugs in over a year.  Denies any alcohol use.  Patient states that he is noncompliant with medication for HIV over the last 6 months.  Review of Systems  Positive: Fatigue Negative: Fever, chills, blood in stool, melena, SI, HI, auditory hallucinations, visual hallucinations  Physical Exam  BP 112/66 (BP Location: Left Arm)   Pulse 94   Temp 97.9 F (36.6 C) (Oral)   Resp 14   SpO2 99%  Gen:   Awake, no distress   Resp:  Normal effort  MSK:   Moves extremities without difficulty  Other:  Pupils 4 mm bilaterally and PERRL.  +2 radial pulse bilaterally  Medical Decision Making  Medically screening exam initiated at 2:33 PM.  Appropriate orders placed.  Fredy Gladu was informed that the remainder of the evaluation will be completed by another provider, this initial triage assessment does not replace that evaluation, and the importance of remaining in the ED until their evaluation is complete.     Haskel Schroeder, [place] 01/03/22 1436

## 2022-01-07 ENCOUNTER — Other Ambulatory Visit (HOSPITAL_COMMUNITY): Payer: Self-pay

## 2022-01-08 ENCOUNTER — Encounter (HOSPITAL_COMMUNITY): Payer: Self-pay | Admitting: *Deleted

## 2022-01-08 ENCOUNTER — Emergency Department (HOSPITAL_COMMUNITY)
Admission: EM | Admit: 2022-01-08 | Discharge: 2022-01-08 | Disposition: A | Discharge status: Home / Self Care | Payer: Medicaid Other | Attending: Emergency Medicine | Admitting: Emergency Medicine

## 2022-01-08 ENCOUNTER — Other Ambulatory Visit: Payer: Self-pay

## 2022-01-08 DIAGNOSIS — R531 Weakness: Secondary | ICD-10-CM | POA: Diagnosis present

## 2022-01-08 DIAGNOSIS — Z21 Asymptomatic human immunodeficiency virus [HIV] infection status: Secondary | ICD-10-CM | POA: Diagnosis not present

## 2022-01-08 DIAGNOSIS — Z79899 Other long term (current) drug therapy: Secondary | ICD-10-CM | POA: Insufficient documentation

## 2022-01-08 DIAGNOSIS — R5383 Other fatigue: Secondary | ICD-10-CM | POA: Insufficient documentation

## 2022-01-08 LAB — RAPID URINE DRUG SCREEN, HOSP PERFORMED
Amphetamines: POSITIVE — AB
Barbiturates: NOT DETECTED
Benzodiazepines: NOT DETECTED
Cocaine: POSITIVE — AB
Opiates: NOT DETECTED
Tetrahydrocannabinol: NOT DETECTED

## 2022-01-08 LAB — BASIC METABOLIC PANEL
Anion gap: 7 (ref 5–15)
BUN: 13 mg/dL (ref 6–20)
CO2: 25 mmol/L (ref 22–32)
Calcium: 8.7 mg/dL — ABNORMAL LOW (ref 8.9–10.3)
Chloride: 105 mmol/L (ref 98–111)
Creatinine, Ser: 1.2 mg/dL (ref 0.61–1.24)
GFR, Estimated: >60 mL/min (ref >60)
Glucose, Bld: 111 mg/dL — ABNORMAL HIGH (ref 70–99)
Potassium: 3.4 mmol/L — ABNORMAL LOW (ref 3.5–5.1)
Sodium: 137 mmol/L (ref 135–145)

## 2022-01-08 LAB — CBC
HCT: 42.3 % (ref 39.0–52.0)
Hemoglobin: 13.3 g/dL (ref 13.0–17.0)
MCH: 26.2 pg (ref 26.0–34.0)
MCHC: 31.4 g/dL (ref 30.0–36.0)
MCV: 83.4 fL (ref 80.0–100.0)
Platelets: 288 10*3/uL (ref 150–400)
RBC: 5.07 MIL/uL (ref 4.22–5.81)
RDW: 16 % — ABNORMAL HIGH (ref 11.5–15.5)
WBC: 5.9 10*3/uL (ref 4.0–10.5)
nRBC: 0 % (ref 0.0–0.2)

## 2022-01-08 LAB — T-HELPER CELLS (CD4) COUNT (NOT AT ARMC)
CD4 % Helper T Cell: 26 % — ABNORMAL LOW (ref 33–65)
CD4 T Cell Abs: 462 /uL (ref 400–1790)

## 2022-01-08 LAB — TSH: TSH: 1.799 u[IU]/mL (ref 0.350–4.500)

## 2022-01-08 LAB — ETHANOL: Alcohol, Ethyl (B): <10 mg/dL (ref <10)

## 2022-01-08 MED ORDER — NALOXONE HCL 4 MG/0.1ML NA LIQD
1.0000 | Freq: Once | NASAL | Status: AC
  Administered 2022-01-08: 1 via NASAL
  Filled 2022-01-08: qty 4

## 2022-01-08 NOTE — Discharge Instructions (Signed)
Today you were worked up for weakness and fatigue.  Laboratory studies demonstrate no anemia, normal blood sugars, no electrolyte abnormalities, no profound dehydration.  Your thyroid function is within normal limits.  No respiratory distress or signs of infection or sepsis.  Recommended for close follow-up with your primary care physician for further management of fatigue.

## 2022-01-08 NOTE — ED Triage Notes (Signed)
Pt says he feels weak all over. He nods off during triage, awakens to voice. Says his feet hurt from walking. Has not been eating and drinking.

## 2022-01-08 NOTE — ED Provider Notes (Signed)
[name] EMERGENCY DEPARTMENT Provider Note   CSN: [id number] Arrival date & time: 01/08/22  5329     History  Chief Complaint  Patient presents with   Weakness    Blake Chambers is a 31 y.o. male.  Pt is a 31 yo male with pmh of homelessness, substance abuse, bipolar disorder, anxiety, and HIV last seen in ED on 7/21 with CD4 count in 600s presenting for generalized weakness and fatigue. Pt is lethargic on exam and falls asleep multiple times throughout interview. Denies any fevers, chills, uri symptoms, nausea, vomiting, or diarrhea. Denies any falls or head trauma. Denies any bleeding or blood loss. Denies hx of drug or alcohol use.   The history is provided by the patient. No language interpreter was used.  Weakness Associated symptoms: no abdominal pain, no arthralgias, no chest pain, no cough, no dysuria, no fever, no seizures, no shortness of breath and no vomiting        Home Medications Prior to Admission medications   Medication Sig Start Date End Date Taking? Authorizing Provider  bictegravir-emtricitabine-tenofovir AF (BIKTARVY) 50-200-25 MG TABS tablet Take 1 tablet by mouth daily. 12/21/21   Mare Ferrari, PA-C  ibuprofen (ADVIL) 600 MG tablet Take 1 tablet (600 mg total) by mouth every 6 (six) hours as needed. Patient not taking: Reported on 11/01/2021 06/30/21   Gwyneth Sprout, MD      Allergies    Tenofovir disoproxil and Kiwi extract    Review of Systems   Review of Systems  Constitutional:  Positive for fatigue. Negative for chills and fever.  HENT:  Negative for ear pain and sore throat.   Eyes:  Negative for pain and visual disturbance.  Respiratory:  Negative for cough and shortness of breath.   Cardiovascular:  Negative for chest pain and palpitations.  Gastrointestinal:  Negative for abdominal pain and vomiting.  Genitourinary:  Negative for dysuria and hematuria.  Musculoskeletal:  Negative for arthralgias and back pain.   Skin:  Negative for color change and rash.  Neurological:  Positive for weakness (generalized). Negative for seizures and syncope.  All other systems reviewed and are negative.   Physical Exam Updated Vital Signs BP 128/90   Pulse 73   Temp 98.2 F (36.8 C) (Oral)   Resp 13   SpO2 100%  Physical Exam Vitals and nursing note reviewed.  Constitutional:      General: He is not in acute distress.    Appearance: He is well-developed.  HENT:     Head: Normocephalic and atraumatic.  Eyes:     General: Lids are normal. Vision grossly intact.     Conjunctiva/sclera: Conjunctivae normal.     Pupils: Pupils are equal, round, and reactive to light.  Cardiovascular:     Rate and Rhythm: Normal rate and regular rhythm.     Heart sounds: No murmur heard. Pulmonary:     Effort: Pulmonary effort is normal. No respiratory distress.     Breath sounds: Normal breath sounds.  Abdominal:     Palpations: Abdomen is soft.     Tenderness: There is no abdominal tenderness.  Musculoskeletal:        General: No swelling.     Cervical back: Neck supple.  Skin:    General: Skin is warm and dry.     Capillary Refill: Capillary refill takes less than 2 seconds.  Neurological:     Mental Status: He is oriented to person, place, and time. He is lethargic.  GCS: GCS eye subscore is 4. GCS verbal subscore is 5. GCS motor subscore is 6.     Cranial Nerves: Cranial nerves 2-12 are intact.     Sensory: Sensation is intact.     Motor: Motor function is intact.     Coordination: Coordination is intact.  Psychiatric:        Mood and Affect: Mood normal.     ED Results / Procedures / Treatments   Labs (all labs ordered are listed, but only abnormal results are displayed) Labs Reviewed  BASIC METABOLIC PANEL - Abnormal; Notable for the following components:      Result Value   Potassium 3.4 (*)    Glucose, Bld 111 (*)    Calcium 8.7 (*)    All other components within normal limits  CBC -  Abnormal; Notable for the following components:   RDW 16.0 (*)    All other components within normal limits  RAPID URINE DRUG SCREEN, HOSP PERFORMED - Abnormal; Notable for the following components:   Cocaine POSITIVE (*)    Amphetamines POSITIVE (*)    All other components within normal limits  T-HELPER CELLS (CD4) COUNT (NOT AT [facility]) - Abnormal; Notable for the following components:   CD4 % Helper T Cell 26 (*)    All other components within normal limits  ETHANOL  TSH    EKG None  Radiology No results found.  Procedures Procedures    Medications Ordered in ED Medications  naloxone (NARCAN) nasal spray 4 mg/0.1 mL (1 spray Nasal Provided for home use 01/08/22 0832)    ED Course/ Medical Decision Making/ A&P                           Medical Decision Making Amount and/or Complexity of Data Reviewed Labs: ordered.  Risk Prescription drug management.   9:28 PM 31 yo male with pmh of homelessness and HIV last seen in ED on 7/21 with CD4 count in 600s presenting for generalized weakness and fatigue. Pt is Aox3, no acute distress, afebrile, with stable vitals. On exam pt is sleepy but arousal.   Fatigue/generalized weakness: No s/s sepsis No hypoglycemia.  No hypoxia. No respiratory distress or concerns for hypercarbia No anemia Stable electrolytes No mixedema coma.  Hx of substance abuse. Minimal improvement with narcan. Denies hx of falls or blunt head trauma. CT head demonstrates no acute process  Patient in no distress and overall condition improved here in the ED. Detailed discussions were had with the patient regarding current findings, and need for close f/u with PCP or on call doctor. The patient has been instructed to return immediately if the symptoms worsen in any way for re-evaluation. Patient verbalized understanding and is in agreement with current care plan. All questions answered prior to discharge.         Final Clinical Impression(s) / ED  Diagnoses Final diagnoses:  Weakness  Other fatigue    Rx / DC Orders ED Discharge Orders     None         Franne Forts, DO 01/12/22 2128

## 2022-01-08 NOTE — ED Triage Notes (Signed)
Pt [place] via [facility] [place] with c/o weakness per EMS. En route, pt has been sleepy, easily awakens. Vitals 116/70, cbg 119, 70hr, 98%o2, rr 16.

## 2022-01-12 ENCOUNTER — Emergency Department (HOSPITAL_COMMUNITY)
Admission: EM | Admit: 2022-01-12 | Discharge: 2022-01-12 | Disposition: A | Discharge status: Home / Self Care | Payer: Medicaid Other | Attending: Emergency Medicine | Admitting: Emergency Medicine

## 2022-01-12 ENCOUNTER — Encounter (HOSPITAL_COMMUNITY): Payer: Self-pay

## 2022-01-12 ENCOUNTER — Other Ambulatory Visit: Payer: Self-pay

## 2022-01-12 DIAGNOSIS — M79605 Pain in left leg: Secondary | ICD-10-CM | POA: Insufficient documentation

## 2022-01-12 DIAGNOSIS — M79604 Pain in right leg: Secondary | ICD-10-CM | POA: Diagnosis not present

## 2022-01-12 DIAGNOSIS — Z5321 Procedure and treatment not carried out due to patient leaving prior to being seen by health care provider: Secondary | ICD-10-CM | POA: Insufficient documentation

## 2022-01-12 NOTE — ED Notes (Signed)
Patient called twice

## 2022-01-12 NOTE — ED Triage Notes (Signed)
Arrives EMS from outside. Has been walking all day and has bilateral leg pain.

## 2022-02-04 ENCOUNTER — Encounter (HOSPITAL_COMMUNITY): Payer: Self-pay | Admitting: Emergency Medicine

## 2022-02-04 ENCOUNTER — Emergency Department (HOSPITAL_COMMUNITY): Payer: Medicaid Other

## 2022-02-04 ENCOUNTER — Emergency Department (HOSPITAL_COMMUNITY)
Admission: EM | Admit: 2022-02-04 | Discharge: 2022-02-04 | Disposition: A | Discharge status: Home / Self Care | Payer: Medicaid Other | Attending: Emergency Medicine | Admitting: Emergency Medicine

## 2022-02-04 ENCOUNTER — Other Ambulatory Visit: Payer: Self-pay

## 2022-02-04 DIAGNOSIS — Z21 Asymptomatic human immunodeficiency virus [HIV] infection status: Secondary | ICD-10-CM | POA: Insufficient documentation

## 2022-02-04 DIAGNOSIS — R0789 Other chest pain: Secondary | ICD-10-CM

## 2022-02-04 DIAGNOSIS — R072 Precordial pain: Secondary | ICD-10-CM | POA: Insufficient documentation

## 2022-02-04 LAB — CBC
HCT: 42 % (ref 39.0–52.0)
Hemoglobin: 13.3 g/dL (ref 13.0–17.0)
MCH: 26.3 pg (ref 26.0–34.0)
MCHC: 31.7 g/dL (ref 30.0–36.0)
MCV: 83.2 fL (ref 80.0–100.0)
Platelets: 308 10*3/uL (ref 150–400)
RBC: 5.05 MIL/uL (ref 4.22–5.81)
RDW: 15.9 % — ABNORMAL HIGH (ref 11.5–15.5)
WBC: 6.3 10*3/uL (ref 4.0–10.5)
nRBC: 0 % (ref 0.0–0.2)

## 2022-02-04 LAB — TROPONIN I (HIGH SENSITIVITY)
Troponin I (High Sensitivity): 5 ng/L (ref <18)
Troponin I (High Sensitivity): 5 ng/L (ref <18)

## 2022-02-04 LAB — COMPREHENSIVE METABOLIC PANEL
ALT: 43 U/L (ref 0–44)
AST: 49 U/L — ABNORMAL HIGH (ref 15–41)
Albumin: 2.9 g/dL — ABNORMAL LOW (ref 3.5–5.0)
Alkaline Phosphatase: 94 U/L (ref 38–126)
Anion gap: 10 (ref 5–15)
BUN: 18 mg/dL (ref 6–20)
CO2: 24 mmol/L (ref 22–32)
Calcium: 8.7 mg/dL — ABNORMAL LOW (ref 8.9–10.3)
Chloride: 104 mmol/L (ref 98–111)
Creatinine, Ser: 1.2 mg/dL (ref 0.61–1.24)
GFR, Estimated: >60 mL/min (ref >60)
Glucose, Bld: 103 mg/dL — ABNORMAL HIGH (ref 70–99)
Potassium: 4.2 mmol/L (ref 3.5–5.1)
Sodium: 138 mmol/L (ref 135–145)
Total Bilirubin: 0.4 mg/dL (ref 0.3–1.2)
Total Protein: 7.4 g/dL (ref 6.5–8.1)

## 2022-02-04 MED ORDER — IBUPROFEN 400 MG PO TABS
600.0000 mg | ORAL_TABLET | Freq: Once | ORAL | Status: AC
  Administered 2022-02-04: 600 mg via ORAL
  Filled 2022-02-04: qty 1

## 2022-02-04 NOTE — ED Provider Notes (Signed)
[facility] EMERGENCY DEPARTMENT Provider Note   CSN: [id number] Arrival date & time: 02/04/22  1607     History  Chief Complaint  Patient presents with   Chest Pain    Blake Chambers is a 31 y.o. male.  With PMH of anxiety, bipolar disorder, HIV not on antiretroviral therapy with last CD4 600s in 2021 who presents with substernal nonradiating sharp chest pain that began earlier around 530 this morning and has now already resolved.  Patient said he was walking out of the sheets when he felt a sharp substernal nonradiating chest pain that scared him.  According to triage she is noted shortness of breath but denies shortness of breath to me.  He had no associated nausea, no vomiting, no diaphoresis, no reflux, no abdominal pain, no cough, no recent fevers or infectious symptoms.  No leg pain or swelling.  No history of PE, DVT, stroke or heart attack.  He says he has history of heart disease in his family and was concern for chest pain which he has never felt before so he came into the ER.  Is worse with palpation and thinks is related to all the heavy bags he has been carrying.  He has not taken anything for pain.  He does not take his antiretrovirals.   Chest Pain      Home Medications Prior to Admission medications   Medication Sig Start Date End Date Taking? Authorizing Provider  bictegravir-emtricitabine-tenofovir AF (BIKTARVY) 50-200-25 MG TABS tablet Take 1 tablet by mouth daily. 12/21/21   Mare Ferrari, PA-C  ibuprofen (ADVIL) 600 MG tablet Take 1 tablet (600 mg total) by mouth every 6 (six) hours as needed. Patient not taking: Reported on 11/01/2021 06/30/21   Gwyneth Sprout, MD      Allergies    Tenofovir disoproxil and Kiwi extract    Review of Systems   Review of Systems  Cardiovascular:  Positive for chest pain.    Physical Exam Updated Vital Signs BP 111/77   Pulse 64   Temp 97.6 F (36.4 C) (Oral)   Resp 16   SpO2 100%  Physical  Exam Constitutional: Alert and oriented. Well appearing and in no distress. Eyes: Conjunctivae are normal. ENT      Head: Normocephalic and atraumatic.      Nose: No congestion.      Mouth/Throat: Mucous membranes are moist.      Neck: No stridor. Cardiovascular: S1, S2,  Normal and symmetric distal pulses are present in all extremities.Warm and well perfused.  Reproducible chest pain on anterior mid chest wall palpation, no external evidence of injury, no swelling. Respiratory: Normal respiratory effort. Breath sounds are normal.  O2 sat 100% on room air. Gastrointestinal: Soft and nontender. There is no CVA tenderness. Musculoskeletal: Normal range of motion in all extremities.      Right lower leg: No tenderness or edema.      Left lower leg: No tenderness or edema. Neurologic: Normal speech and language. No gross focal neurologic deficits are appreciated. Skin: Skin is warm, dry and intact. No rash noted. Psychiatric: Mood and affect are normal. Speech and behavior are normal.  ED Results / Procedures / Treatments   Labs (all labs ordered are listed, but only abnormal results are displayed) Labs Reviewed  CBC - Abnormal; Notable for the following components:      Result Value   RDW 15.9 (*)    All other components within normal limits  COMPREHENSIVE METABOLIC PANEL -  Abnormal; Notable for the following components:   Glucose, Bld 103 (*)    Calcium 8.7 (*)    Albumin 2.9 (*)    AST 49 (*)    All other components within normal limits  TROPONIN I (HIGH SENSITIVITY)  TROPONIN I (HIGH SENSITIVITY)    EKG EKG Interpretation  Date/Time:  Tuesday February 04 2022 06:49:07 EDT Ventricular Rate:  92 PR Interval:  132 QRS Duration: 82 QT Interval:  346 QTC Calculation: 427 R Axis:   56 Text Interpretation: Unusual P axis and short PR, probable junctional tachycardia Abnormal ECG When compared with ECG of 05-Nov-2021 04:29, PREVIOUS ECG IS PRESENT Nonspecific T wave inversion avL  Confirmed by Vivien Rossetti ([id number]) on 02/04/2022 6:57:30 AM  Radiology DG Chest 2 View  Result Date: 02/04/2022 CLINICAL DATA:  31 year old male with history of chest pain and shortness of breath. EXAM: CHEST - 2 VIEW COMPARISON:  Chest x-ray 01/03/2022. FINDINGS: Lung volumes are normal. No consolidative airspace disease. No pleural effusions. No pneumothorax. No pulmonary nodule or mass noted. Pulmonary vasculature and the cardiomediastinal silhouette are within normal limits. IMPRESSION: No radiographic evidence of acute cardiopulmonary disease. Electronically Signed   By: Trudie Reed M.D.   On: 02/04/2022 07:20    Procedures Procedures  Remained on constant cardiac monitoring, normal sinus rhythm. Medications Ordered in ED Medications  ibuprofen (ADVIL) tablet 600 mg (600 mg Oral Given 02/04/22 0743)    ED Course/ Medical Decision Making/ A&P Clinical Course as of 02/04/22 0953  Tue Feb 04, 2022  0951 Patient's EKG normal sinus rhythm with no ischemic change and reassuring.  Generally unchanged from previous.  He had repeat troponins which were flat, initial 5, repeat 5.  No longer actively having pain.  Improved with ibuprofen.  Chest x-ray with no pneumonia, no pneumothorax, no pleural effusion.  Discussed strict return precautions and safe for discharge with follow-up with PCP.  He is in agreement with plan. [VB]    Clinical Course User Index [VB] Mardene Sayer, MD                           Medical Decision Making Blake Chambers is a 31 y.o. male.  With PMH of anxiety, bipolar disorder, HIV not on antiretroviral therapy with last CD4 600s in 2021 who presents with substernal nonradiating sharp chest pain that began earlier around 530 this morning.  Regarding the patient's chest pain, the chest pain is atypical and reproducible on exam which makes me have high suspicion for musculoskeletal etiology such as strain or costochondritis.  Their HEART score is 1 low risk and  overall have an EKG which is reassuring and unchanged from previous. Will further evaluate for ACS with high-sensitivity troponin. .  Chest x-ray was obtained which showed no evidence of pneumonia, pneumothorax, and pulmonary edema. I do not think PE because patient is PERC negative. I do not think aortic dissection as the patient is well-appearing, does not have ripping/tearing pain and equally has no pulse or neurologic deficits.      Amount and/or Complexity of Data Reviewed Labs: ordered. Decision-making details documented in ED Course. Radiology: ordered and independent interpretation performed. Decision-making details documented in ED Course.    Details: Independent interpretation no consolidation concerning for pneumonia, no pneumothorax ECG/medicine tests: independent interpretation performed. Decision-making details documented in ED Course.    Final Clinical Impression(s) / ED Diagnoses Final diagnoses:  Atypical chest pain    Rx / DC  Orders ED Discharge Orders     None         Mardene Sayer, MD 02/04/22 [phone]

## 2022-02-04 NOTE — ED Triage Notes (Signed)
Patient [place] with EMS from street reports chest pain with mild SOB onset yesterday while walking , no emesis or diaphoresis /no cough or fever .

## 2022-02-04 NOTE — Discharge Instructions (Addendum)
You were seen in the emergency department today for chest pain. You workup did not reveal a definite cause of your symptoms but was generally reassuring.   Return to the emergency department immediately if you develop recurrent, severe chest pain, shortness of breath, fainting spells, sudden sweatiness, or any other concerning symptoms.   Please also make an appointment to follow up with your primary care doctor or cardiologist within one week to assure improvement or resolution in symptoms. Further testing may be necessary, so it is extremely important to keep your follow-up appointment with your primary doctor.   

## 2022-02-15 ENCOUNTER — Encounter (HOSPITAL_COMMUNITY): Payer: Self-pay | Admitting: Emergency Medicine

## 2022-02-15 ENCOUNTER — Emergency Department (HOSPITAL_COMMUNITY)
Admission: EM | Admit: 2022-02-15 | Discharge: 2022-02-15 | Disposition: A | Discharge status: Home / Self Care | Payer: Medicaid Other | Attending: Emergency Medicine | Admitting: Emergency Medicine

## 2022-02-15 ENCOUNTER — Other Ambulatory Visit: Payer: Self-pay

## 2022-02-15 DIAGNOSIS — M79672 Pain in left foot: Secondary | ICD-10-CM | POA: Diagnosis not present

## 2022-02-15 DIAGNOSIS — Z5900 Homelessness unspecified: Secondary | ICD-10-CM | POA: Diagnosis not present

## 2022-02-15 DIAGNOSIS — M79671 Pain in right foot: Secondary | ICD-10-CM | POA: Diagnosis not present

## 2022-02-15 NOTE — ED Triage Notes (Signed)
Patient reports bilateral foot pain onset last week from walking a lot , denies injury/ambulatory , he adds skin rashes at face for several days .

## 2022-02-15 NOTE — ED Provider Notes (Signed)
  [facility] EMERGENCY DEPARTMENT Provider Note   CSN: [id number] Arrival date & time: 02/15/22  0443     History  Chief Complaint  Patient presents with   Feet Pain Blake Chambers    Blake Chambers is a 31 y.o. male.  The history is provided by the patient and medical records.   31 y.o. M here with bilateral foot pain after walking a lot.  He is currently homeless.  He is requesting something to drink.  Home Medications Prior to Admission medications   Medication Sig Start Date End Date Taking? Authorizing Provider  bictegravir-emtricitabine-tenofovir AF (BIKTARVY) 50-200-25 MG TABS tablet Take 1 tablet by mouth daily. 12/21/21   Mare Ferrari, PA-C  ibuprofen (ADVIL) 600 MG tablet Take 1 tablet (600 mg total) by mouth every 6 (six) hours as needed. Patient not taking: Reported on 11/01/2021 06/30/21   Gwyneth Sprout, MD      Allergies    Tenofovir disoproxil and Kiwi extract    Review of Systems   Review of Systems  Musculoskeletal:  Positive for arthralgias.  All other systems reviewed and are negative.   Physical Exam Updated Vital Signs BP 126/80   Pulse 62   Temp 97.6 F (36.4 C)   Resp 16   SpO2 100%  Physical Exam Vitals and nursing note reviewed.  Constitutional:      Appearance: He is well-developed.     Comments: Sleeping on stretcher, NAD  HENT:     Head: Normocephalic and atraumatic.  Eyes:     Conjunctiva/sclera: Conjunctivae normal.     Pupils: Pupils are equal, round, and reactive to light.  Cardiovascular:     Rate and Rhythm: Normal rate and regular rhythm.     Heart sounds: Normal heart sounds.  Pulmonary:     Effort: Pulmonary effort is normal.     Breath sounds: Normal breath sounds.  Musculoskeletal:        General: Normal range of motion.     Cervical back: Normal range of motion.  Skin:    General: Skin is warm and dry.  Neurological:     Mental Status: He is alert and oriented to person, place, and time.     ED  Results / Procedures / Treatments   Labs (all labs ordered are listed, but only abnormal results are displayed) Labs Reviewed - No data to display  EKG None  Radiology No results found.  Procedures Procedures    Medications Ordered in ED Medications - No data to display  ED Course/ Medical Decision Making/ A&P                           Medical Decision Making  31 y.o. M here with bilateral foot pain due to walking.  He is currently homeless.  He is well known to this facility.  He appears at his baseline.  Do not feel he requires emergent work-up today.  Stable for discharge.  Final Clinical Impression(s) / ED Diagnoses Final diagnoses:  Bilateral foot pain    Rx / DC Orders ED Discharge Orders     None         Garlon Hatchet, PA-C 02/15/22 0532    Sabas Sous, MD 02/16/22 [phone]

## 2022-02-16 ENCOUNTER — Other Ambulatory Visit: Payer: Self-pay

## 2022-02-16 ENCOUNTER — Emergency Department (HOSPITAL_COMMUNITY)
Admission: EM | Admit: 2022-02-16 | Discharge: 2022-02-16 | Disposition: A | Discharge status: Home / Self Care | Payer: Medicaid Other | Attending: Emergency Medicine | Admitting: Emergency Medicine

## 2022-02-16 ENCOUNTER — Encounter (HOSPITAL_COMMUNITY): Payer: Self-pay | Admitting: Emergency Medicine

## 2022-02-16 DIAGNOSIS — R110 Nausea: Secondary | ICD-10-CM | POA: Diagnosis not present

## 2022-02-16 DIAGNOSIS — Z21 Asymptomatic human immunodeficiency virus [HIV] infection status: Secondary | ICD-10-CM | POA: Diagnosis not present

## 2022-02-16 DIAGNOSIS — R109 Unspecified abdominal pain: Secondary | ICD-10-CM | POA: Diagnosis not present

## 2022-02-16 LAB — COMPREHENSIVE METABOLIC PANEL
ALT: 41 U/L (ref 0–44)
AST: 43 U/L — ABNORMAL HIGH (ref 15–41)
Albumin: 2.9 g/dL — ABNORMAL LOW (ref 3.5–5.0)
Alkaline Phosphatase: 73 U/L (ref 38–126)
Anion gap: 10 (ref 5–15)
BUN: 12 mg/dL (ref 6–20)
CO2: 23 mmol/L (ref 22–32)
Calcium: 8.6 mg/dL — ABNORMAL LOW (ref 8.9–10.3)
Chloride: 105 mmol/L (ref 98–111)
Creatinine, Ser: 1.23 mg/dL (ref 0.61–1.24)
GFR, Estimated: >60 mL/min (ref >60)
Glucose, Bld: 95 mg/dL (ref 70–99)
Potassium: 4 mmol/L (ref 3.5–5.1)
Sodium: 138 mmol/L (ref 135–145)
Total Bilirubin: 0.5 mg/dL (ref 0.3–1.2)
Total Protein: 7.5 g/dL (ref 6.5–8.1)

## 2022-02-16 LAB — URINALYSIS, ROUTINE W REFLEX MICROSCOPIC
Bilirubin Urine: NEGATIVE
Glucose, UA: NEGATIVE mg/dL
Hgb urine dipstick: NEGATIVE
Ketones, ur: NEGATIVE mg/dL
Leukocytes,Ua: NEGATIVE
Nitrite: NEGATIVE
Protein, ur: NEGATIVE mg/dL
Specific Gravity, Urine: 1.025 (ref 1.005–1.030)
pH: 5 (ref 5.0–8.0)

## 2022-02-16 LAB — CBC
HCT: 41.5 % (ref 39.0–52.0)
Hemoglobin: 13.2 g/dL (ref 13.0–17.0)
MCH: 26.3 pg (ref 26.0–34.0)
MCHC: 31.8 g/dL (ref 30.0–36.0)
MCV: 82.8 fL (ref 80.0–100.0)
Platelets: 312 10*3/uL (ref 150–400)
RBC: 5.01 MIL/uL (ref 4.22–5.81)
RDW: 15.9 % — ABNORMAL HIGH (ref 11.5–15.5)
WBC: 5.8 10*3/uL (ref 4.0–10.5)
nRBC: 0 % (ref 0.0–0.2)

## 2022-02-16 LAB — LIPASE, BLOOD: Lipase: 35 U/L (ref 11–51)

## 2022-02-16 NOTE — ED Provider Notes (Signed)
[name] EMERGENCY DEPARTMENT Provider Note   CSN: [id number] Arrival date & time: 02/16/22  0252     History  Chief Complaint  Patient presents with   Abdominal Pain    Blake Chambers is a 31 y.o. male.  The history is provided by the patient and medical records.  Abdominal Pain  31 year old male with history of HIV, presenting to the ED with abdominal pain and nausea.  States he took too many "stress reliever pills".  He denies any intent of self-harm.  No vomiting.  Has been sleeping throughout majority of ED visit.  Home Medications Prior to Admission medications   Medication Sig Start Date End Date Taking? Authorizing Provider  bictegravir-emtricitabine-tenofovir AF (BIKTARVY) 50-200-25 MG TABS tablet Take 1 tablet by mouth daily. 12/21/21   Mare Ferrari, PA-C  ibuprofen (ADVIL) 600 MG tablet Take 1 tablet (600 mg total) by mouth every 6 (six) hours as needed. Patient not taking: Reported on 11/01/2021 06/30/21   Gwyneth Sprout, MD      Allergies    Tenofovir disoproxil and Kiwi extract    Review of Systems   Review of Systems  Gastrointestinal:  Positive for abdominal pain.  All other systems reviewed and are negative.   Physical Exam Updated Vital Signs BP 102/83   Pulse 88   Temp 98 F (36.7 C)   Resp 20   SpO2 100%   Physical Exam Vitals and nursing note reviewed.  Constitutional:      Appearance: He is well-developed.     Comments: Sleeping, awoken for exam but falls back asleep quickly  HENT:     Head: Normocephalic and atraumatic.  Eyes:     Conjunctiva/sclera: Conjunctivae normal.     Pupils: Pupils are equal, round, and reactive to light.  Cardiovascular:     Rate and Rhythm: Normal rate and regular rhythm.     Heart sounds: Normal heart sounds.  Pulmonary:     Effort: Pulmonary effort is normal.     Breath sounds: Normal breath sounds.  Abdominal:     General: Bowel sounds are normal.     Palpations: Abdomen is  soft.     Tenderness: There is no abdominal tenderness. There is no guarding or rebound.  Musculoskeletal:        General: Normal range of motion.     Cervical back: Normal range of motion.  Skin:    General: Skin is warm and dry.  Neurological:     Mental Status: He is alert and oriented to person, place, and time.     ED Results / Procedures / Treatments   Labs (all labs ordered are listed, but only abnormal results are displayed) Labs Reviewed  COMPREHENSIVE METABOLIC PANEL - Abnormal; Notable for the following components:      Result Value   Calcium 8.6 (*)    Albumin 2.9 (*)    AST 43 (*)    All other components within normal limits  CBC - Abnormal; Notable for the following components:   RDW 15.9 (*)    All other components within normal limits  LIPASE, BLOOD  URINALYSIS, ROUTINE W REFLEX MICROSCOPIC    EKG None  Radiology No results found.  Procedures Procedures    Medications Ordered in ED Medications - No data to display  ED Course/ Medical Decision Making/ A&P  Medical Decision Making Amount and/or Complexity of Data Reviewed Labs: ordered. ECG/medicine tests: ordered and independent interpretation performed.   31 year old male here with abdominal pain and nausea.  Reportedly thinks he may have taken too many over-the-counter "stress relief pills".  Patient is sleeping, awoken for exam but falls back asleep quickly.  His abdomen is soft and nontender.  He does have 2 boxes of "calm lozenges" at bedside, however both of these are still sealed and have not been opened.  Ingredients reviewed-- vitamins only, no toxic appearing ingredients.  Labs were obtained from triage and reviewed, no acute findings.  Do not feel he needs further work-up.  Discharge home with PCP follow-up.  Can return here for new concerns.  Final Clinical Impression(s) / ED Diagnoses Final diagnoses:  Abdominal pain, unspecified abdominal location    Rx /  DC Orders ED Discharge Orders     None         Garlon Hatchet, PA-C 02/16/22 0636    Zadie Rhine, MD 02/16/22 [phone]

## 2022-02-16 NOTE — ED Notes (Addendum)
Patient woken up and discharge instructions reviewed.  Patient kept falling asleep during instructions and states "I need to get checked out."  Patient assured that he was checked out and discharged.  Patient then fell back asleep and refused to respond to this nurse when attempting to get him to leave. Security contacted.

## 2022-02-16 NOTE — ED Notes (Signed)
Security and [employer] at bedside.

## 2022-02-16 NOTE — ED Triage Notes (Signed)
Pt brought to ED by PTAR for evaluation of abdominal pain x1 week with nausea. PTAR states pt had "stress reliever pills OTC" and may have taken too many.   PTAR Vitals BP 110/78 HR 60 RR 18

## 2022-02-21 ENCOUNTER — Emergency Department (HOSPITAL_COMMUNITY)
Admission: EM | Admit: 2022-02-21 | Discharge: 2022-02-21 | Disposition: A | Discharge status: Home / Self Care | Payer: Medicaid Other | Attending: Emergency Medicine | Admitting: Emergency Medicine

## 2022-02-21 ENCOUNTER — Encounter (HOSPITAL_COMMUNITY): Payer: Self-pay | Admitting: *Deleted

## 2022-02-21 ENCOUNTER — Other Ambulatory Visit: Payer: Self-pay

## 2022-02-21 DIAGNOSIS — Z5321 Procedure and treatment not carried out due to patient leaving prior to being seen by health care provider: Secondary | ICD-10-CM | POA: Diagnosis not present

## 2022-02-21 DIAGNOSIS — H938X2 Other specified disorders of left ear: Secondary | ICD-10-CM | POA: Insufficient documentation

## 2022-02-21 NOTE — ED Triage Notes (Signed)
Pt c/o swelling behind his L ear for 2- 3 weeks

## 2022-02-21 NOTE — ED Notes (Signed)
PATIENT WAS ROOMED WHEN CALL NO [id number]

## 2022-03-07 ENCOUNTER — Other Ambulatory Visit: Payer: Self-pay

## 2022-03-07 ENCOUNTER — Emergency Department (HOSPITAL_COMMUNITY)
Admission: EM | Admit: 2022-03-07 | Discharge: 2022-03-07 | Disposition: A | Discharge status: Home / Self Care | Payer: Medicaid Other | Attending: Emergency Medicine | Admitting: Emergency Medicine

## 2022-03-07 ENCOUNTER — Encounter (HOSPITAL_COMMUNITY): Payer: Self-pay | Admitting: Emergency Medicine

## 2022-03-07 DIAGNOSIS — R21 Rash and other nonspecific skin eruption: Secondary | ICD-10-CM | POA: Diagnosis present

## 2022-03-07 DIAGNOSIS — L500 Allergic urticaria: Secondary | ICD-10-CM | POA: Diagnosis not present

## 2022-03-07 DIAGNOSIS — W57XXXA Bitten or stung by nonvenomous insect and other nonvenomous arthropods, initial encounter: Secondary | ICD-10-CM

## 2022-03-07 DIAGNOSIS — T7840XA Allergy, unspecified, initial encounter: Secondary | ICD-10-CM

## 2022-03-07 MED ORDER — AMOXICILLIN-POT CLAVULANATE 875-125 MG PO TABS: 1.0000 | ORAL_TABLET | Freq: Two times a day (BID) | ORAL | 0 refills | Status: DC

## 2022-03-07 MED ORDER — CETIRIZINE HCL 10 MG PO TABS: 10.0000 mg | ORAL_TABLET | Freq: Every day | ORAL | 0 refills | Status: DC

## 2022-03-07 MED ORDER — DIPHENHYDRAMINE HCL 25 MG PO CAPS
25.0000 mg | ORAL_CAPSULE | Freq: Once | ORAL | Status: AC
  Administered 2022-03-07: 25 mg via ORAL
  Filled 2022-03-07: qty 1

## 2022-03-07 MED ORDER — AMOXICILLIN-POT CLAVULANATE 875-125 MG PO TABS
1.0000 | ORAL_TABLET | Freq: Once | ORAL | Status: AC
  Administered 2022-03-07: 1 via ORAL
  Filled 2022-03-07: qty 1

## 2022-03-07 NOTE — ED Triage Notes (Signed)
Pt reported to ED with c/o insect bite to groin. Pt states he was sleeping outside tonight when he was bit.

## 2022-03-07 NOTE — ED Provider Notes (Signed)
[facility]EMERGENCY DEPT [facility] Emergency Department Provider Note MRN:  [id number]  Arrival date & time: 03/07/22     Chief Complaint   Insect Bite   History of Present Illness   Blake Chambers is a 31 y.o. year-old male presents to the ED with chief complaint of insect bite.  He states that he thinks he was bitten near the tip of his penis as well as on his buttocks.  He denies any new sexual contacts.  Denies any fevers or chills.  He reports some irritation at these locations.  He states that he sleeps outside.  He also reports having slight rash on the left side of his neck, which is itchy.  Denies any other associated symptoms.  Denies any treatments prior to arrival..  History provided by patient.   Review of Systems  Pertinent positive and negative review of systems noted in HPI.    Physical Exam   Vitals:   03/07/22 0313 03/07/22 0316  BP: 114/89   Pulse: 91   Temp:  98.4 F (36.9 C)  SpO2: 100%     CONSTITUTIONAL:  non toxic-appearing, NAD NEURO:  Alert and oriented x 3, CN 3-12 grossly intact EYES:  eyes equal and reactive ENT/NECK:  Supple, no stridor  CARDIO:  appears well-perfused  PULM:  No respiratory distress,  GI/GU:  non-distended,  MSK/SPINE:  No gross deformities, no edema, moves all extremities  SKIN:  no rash, atraumatic, mild urticaria to left side of neck, small 0.5 x 0.5 cm indurated area to right buttock without fluctuance or surrounding erythema, there is mild amount of irritation near the head of the penis, but does not appear like herpes, syphilis, or warts.   *Additional and/or pertinent findings included in MDM below  Diagnostic and Interventional Summary    EKG Interpretation  Date/Time:    Ventricular Rate:    PR Interval:    QRS Duration:   QT Interval:    QTC Calculation:   R Axis:     Text Interpretation:         Labs Reviewed - No data to display  No orders to display    Medications  amoxicillin-clavulanate  (AUGMENTIN) 875-125 MG per tablet 1 tablet (1 tablet Oral Given 03/07/22 0339)  diphenhydrAMINE (BENADRYL) capsule 25 mg (25 mg Oral Given 03/07/22 0814)     Procedures  /  Critical Care Procedures  ED Course and Medical Decision Making  I have reviewed the triage vital signs, the nursing notes, and pertinent available records from the EMR.  Social Determinants Affecting Complexity of Care: Patient is homelessness.   ED Course:    Medical Decision Making Patient has questionable insect bite versus early folliculitis to right buttock, also with possible insect bite vs irritation to penile shaft, but doesn't look like chancre.   Will cover with abx.  Will give Benadryl for allergic reaction and Rx for zyrtec.    Overall, patient is well appearing and I don't think he needs further workup or hospitalization.  Risk OTC drugs. Prescription drug management.     Consultants: No consultations were needed in caring for this patient.   Treatment and Plan: Emergency department workup does not suggest an emergent condition requiring admission or immediate intervention beyond  what has been performed at this time. The patient is safe for discharge and has  been instructed to return immediately for worsening symptoms, change in  symptoms or any other concerns    Final Clinical Impressions(s) / ED Diagnoses  ICD-10-CM   1. Insect bite, unspecified site, initial encounter  W57.XXXA     2. Allergic reaction, initial encounter  T78.40XA       ED Discharge Orders          Ordered    amoxicillin-clavulanate (AUGMENTIN) 875-125 MG tablet  Every 12 hours        03/07/22 0438    cetirizine (ZYRTEC ALLERGY) 10 MG tablet  Daily        03/07/22 0438              Discharge Instructions Discussed with and Provided to Patient:   Discharge Instructions   None      Montine Circle, PA-C 03/07/22 0451    Mesner, Corene Cornea, MD 03/07/22 [phone]

## 2022-03-31 ENCOUNTER — Emergency Department (HOSPITAL_COMMUNITY)
Admission: EM | Admit: 2022-03-31 | Discharge: 2022-03-31 | Disposition: A | Discharge status: Home / Self Care | Payer: No Typology Code available for payment source | Attending: Student | Admitting: Student

## 2022-03-31 ENCOUNTER — Encounter (HOSPITAL_COMMUNITY): Payer: Self-pay | Admitting: Emergency Medicine

## 2022-03-31 DIAGNOSIS — Z21 Asymptomatic human immunodeficiency virus [HIV] infection status: Secondary | ICD-10-CM | POA: Insufficient documentation

## 2022-03-31 DIAGNOSIS — M79672 Pain in left foot: Secondary | ICD-10-CM | POA: Diagnosis not present

## 2022-03-31 DIAGNOSIS — M79671 Pain in right foot: Secondary | ICD-10-CM | POA: Diagnosis present

## 2022-03-31 LAB — CBG MONITORING, ED: Glucose-Capillary: 67 mg/dL — ABNORMAL LOW (ref 70–99)

## 2022-03-31 MED ORDER — IBUPROFEN 400 MG PO TABS: 600.0000 mg | ORAL_TABLET | Freq: Once | ORAL | Status: DC

## 2022-03-31 NOTE — Discharge Instructions (Signed)
You were seen in the emergency department today for foot pain.  Please rest as needed.  Please return for any concerns.

## 2022-03-31 NOTE — ED Triage Notes (Signed)
[place] [facility] [place] bilateral foot pain after walking for a long distance, abrasions to hands, and bilateral eye pain. Patient is arouses to voice, is oriented, and in no apparent distress at this time.

## 2022-03-31 NOTE — ED Provider Notes (Signed)
  [facility] EMERGENCY DEPARTMENT Provider Note   CSN: [id number] Arrival date & time: 03/31/22  1255     History {Add pertinent medical, surgical, social history, OB history to HPI:1} Chief Complaint  Patient presents with   Foot Pain    Blake Chambers is a 31 y.o. male.  Bilateral foot pain States he has been walking for 4 days, minimal sleep. No falls.  Feet not swollen. No ulcers. Ambualtory to triage.    Foot Pain       Home Medications Prior to Admission medications   Medication Sig Start Date End Date Taking? Authorizing Provider  amoxicillin-clavulanate (AUGMENTIN) 875-125 MG tablet Take 1 tablet by mouth every 12 (twelve) hours. 03/07/22   Montine Circle, PA-C  bictegravir-emtricitabine-tenofovir AF (BIKTARVY) 50-200-25 MG TABS tablet Take 1 tablet by mouth daily. 12/21/21   Garald Balding, PA-C  cetirizine (ZYRTEC ALLERGY) 10 MG tablet Take 1 tablet (10 mg total) by mouth daily. 03/07/22   Montine Circle, PA-C  ibuprofen (ADVIL) 600 MG tablet Take 1 tablet (600 mg total) by mouth every 6 (six) hours as needed. Patient not taking: Reported on 11/01/2021 06/30/21   Blanchie Dessert, MD      Allergies    Tenofovir disoproxil and Kiwi extract    Review of Systems   Review of Systems  Physical Exam Updated Vital Signs BP (!) 128/94   Pulse 82   Temp 97.6 F (36.4 C) (Oral)   Resp 15   SpO2 99%  Physical Exam  ED Results / Procedures / Treatments   Labs (all labs ordered are listed, but only abnormal results are displayed) Labs Reviewed  CBG MONITORING, ED    EKG None  Radiology No results found.  Procedures Procedures  {Document cardiac monitor, telemetry assessment procedure when appropriate:1}  Medications Ordered in ED Medications - No data to display  ED Course/ Medical Decision Making/ A&P                           Medical Decision Making  ***  {Document critical care time when appropriate:1} {Document review  of labs and clinical decision tools ie heart score, Chads2Vasc2 etc:1}  {Document your independent review of radiology images, and any outside records:1} {Document your discussion with family members, caretakers, and with consultants:1} {Document social determinants of health affecting pt's care:1} {Document your decision making why or why not admission, treatments were needed:1} Final Clinical Impression(s) / ED Diagnoses Final diagnoses:  None    Rx / DC Orders ED Discharge Orders     None

## 2022-04-15 ENCOUNTER — Emergency Department (HOSPITAL_COMMUNITY)
Admission: EM | Admit: 2022-04-15 | Discharge: 2022-04-15 | Disposition: A | Discharge status: Home / Self Care | Payer: No Typology Code available for payment source | Attending: Emergency Medicine | Admitting: Emergency Medicine

## 2022-04-15 ENCOUNTER — Other Ambulatory Visit: Payer: Self-pay

## 2022-04-15 ENCOUNTER — Encounter (HOSPITAL_COMMUNITY): Payer: Self-pay

## 2022-04-15 DIAGNOSIS — M791 Myalgia, unspecified site: Secondary | ICD-10-CM

## 2022-04-15 DIAGNOSIS — M7918 Myalgia, other site: Secondary | ICD-10-CM | POA: Insufficient documentation

## 2022-04-15 DIAGNOSIS — R531 Weakness: Secondary | ICD-10-CM | POA: Diagnosis not present

## 2022-04-15 LAB — COMPREHENSIVE METABOLIC PANEL
ALT: 33 U/L (ref 0–44)
AST: 40 U/L (ref 15–41)
Albumin: 3.1 g/dL — ABNORMAL LOW (ref 3.5–5.0)
Alkaline Phosphatase: 68 U/L (ref 38–126)
Anion gap: 10 (ref 5–15)
BUN: 9 mg/dL (ref 6–20)
CO2: 23 mmol/L (ref 22–32)
Calcium: 9 mg/dL (ref 8.9–10.3)
Chloride: 105 mmol/L (ref 98–111)
Creatinine, Ser: 1.13 mg/dL (ref 0.61–1.24)
GFR, Estimated: >60 mL/min (ref >60)
Glucose, Bld: 158 mg/dL — ABNORMAL HIGH (ref 70–99)
Potassium: 3.3 mmol/L — ABNORMAL LOW (ref 3.5–5.1)
Sodium: 138 mmol/L (ref 135–145)
Total Bilirubin: 0.4 mg/dL (ref 0.3–1.2)
Total Protein: 8 g/dL (ref 6.5–8.1)

## 2022-04-15 LAB — CBC WITH DIFFERENTIAL/PLATELET
Abs Immature Granulocytes: 0.02 10*3/uL (ref 0.00–0.07)
Basophils Absolute: 0.1 10*3/uL (ref 0.0–0.1)
Basophils Relative: 1 %
Eosinophils Absolute: 0.2 10*3/uL (ref 0.0–0.5)
Eosinophils Relative: 2 %
HCT: 41.4 % (ref 39.0–52.0)
Hemoglobin: 13.6 g/dL (ref 13.0–17.0)
Immature Granulocytes: 0 %
Lymphocytes Relative: 34 %
Lymphs Abs: 2.3 10*3/uL (ref 0.7–4.0)
MCH: 26.8 pg (ref 26.0–34.0)
MCHC: 32.9 g/dL (ref 30.0–36.0)
MCV: 81.5 fL (ref 80.0–100.0)
Monocytes Absolute: 0.6 10*3/uL (ref 0.1–1.0)
Monocytes Relative: 9 %
Neutro Abs: 3.8 10*3/uL (ref 1.7–7.7)
Neutrophils Relative %: 54 %
Platelets: 330 10*3/uL (ref 150–400)
RBC: 5.08 MIL/uL (ref 4.22–5.81)
RDW: 15.7 % — ABNORMAL HIGH (ref 11.5–15.5)
WBC: 7 10*3/uL (ref 4.0–10.5)
nRBC: 0 % (ref 0.0–0.2)

## 2022-04-15 LAB — URINALYSIS, ROUTINE W REFLEX MICROSCOPIC
Bacteria, UA: NONE SEEN
Bilirubin Urine: NEGATIVE
Glucose, UA: NEGATIVE mg/dL
Hgb urine dipstick: NEGATIVE
Ketones, ur: NEGATIVE mg/dL
Leukocytes,Ua: NEGATIVE
Nitrite: NEGATIVE
Protein, ur: 30 mg/dL — AB
Specific Gravity, Urine: 1.03 (ref 1.005–1.030)
pH: 5 (ref 5.0–8.0)

## 2022-04-15 LAB — CK: Total CK: 348 U/L (ref 49–397)

## 2022-04-15 NOTE — ED Provider Triage Note (Signed)
  Emergency Medicine Provider Triage Evaluation Note  MRN:  [id number]  Arrival date & time: 04/15/22    Medically screening exam initiated at 2:17 AM.   CC:   Arthralgias  HPI:  Ladell Lea is a 31 y.o. year-old male presents to the ED with chief complaint of generalized arthralgias.  Has been walking all day.  Also complains of a sore finger from having cut it with a pocket knife.  Denies any other symptoms.  History provided by patient. ROS:  -As included in HPI PE:   Vitals:   04/15/22 0204  BP: 120/79  Pulse: 90  Resp: 16  Temp: 97.9 F (36.6 C)  SpO2: 95%    Non-toxic appearing No respiratory distress  MDM:  Based on signs and symptoms, dehydration, fatigue is highest on my differential, followed by rhabdo. I've ordered labs in triage to expedite lab/diagnostic workup.  Patient was informed that the remainder of the evaluation will be completed by another provider, this initial triage assessment does not replace that evaluation, and the importance of remaining in the ED until their evaluation is complete.    Montine Circle, PA-C 04/15/22 [phone]

## 2022-04-15 NOTE — ED Triage Notes (Signed)
[place] the streets.   Says he's been walking all day and legs hurt. Also sts he cut his finger on a pocket knife.

## 2022-04-15 NOTE — Discharge Instructions (Addendum)
Followup with your doctor as needed

## 2022-04-15 NOTE — ED Provider Notes (Signed)
[name] EMERGENCY DEPARTMENT Provider Note   CSN: [id number] Arrival date & time: 04/15/22  0159     History  Chief Complaint  Patient presents with   Generalized Body Aches    Blake Chambers is a 31 y.o. male.  HPI   Patient presents with myalgias/bilateral leg pain.  Patient states he was "walking for really long time" and started having pain in his legs.  He did not lose consciousness, did not they are both equally weak.  He does not have any fevers, vomiting, abdominal pain, lateralized weakness or numbness, headache, vision changes.  Home Medications Prior to Admission medications   Medication Sig Start Date End Date Taking? Authorizing Provider  amoxicillin-clavulanate (AUGMENTIN) 875-125 MG tablet Take 1 tablet by mouth every 12 (twelve) hours. 03/07/22   Roxy Horseman, PA-C  bictegravir-emtricitabine-tenofovir AF (BIKTARVY) 50-200-25 MG TABS tablet Take 1 tablet by mouth daily. 12/21/21   Mare Ferrari, PA-C  cetirizine (ZYRTEC ALLERGY) 10 MG tablet Take 1 tablet (10 mg total) by mouth daily. 03/07/22   Roxy Horseman, PA-C  ibuprofen (ADVIL) 600 MG tablet Take 1 tablet (600 mg total) by mouth every 6 (six) hours as needed. Patient not taking: Reported on 11/01/2021 06/30/21   Gwyneth Sprout, MD      Allergies    Tenofovir disoproxil and Kiwi extract    Review of Systems   Review of Systems  Physical Exam Updated Vital Signs BP 119/84 (BP Location: Right Arm)   Pulse 80   Temp 97.9 F (36.6 C)   Resp 17   Ht 5\' 9"  (1.753 m)   Wt 68 kg   SpO2 97%   BMI 22.15 kg/m  Physical Exam Vitals and nursing note reviewed. Exam conducted with a chaperone present.  Constitutional:      Appearance: Normal appearance.     Comments: Disheveled appearance  HENT:     Head: Normocephalic and atraumatic.  Eyes:     General: No scleral icterus.       Right eye: No discharge.        Left eye: No discharge.     Extraocular Movements: Extraocular  movements intact.     Pupils: Pupils are equal, round, and reactive to light.  Cardiovascular:     Rate and Rhythm: Normal rate and regular rhythm.     Pulses: Normal pulses.     Heart sounds: Normal heart sounds. No murmur heard.    No friction rub. No gallop.  Pulmonary:     Effort: Pulmonary effort is normal. No respiratory distress.     Breath sounds: Normal breath sounds.  Abdominal:     General: Abdomen is flat. Bowel sounds are normal. There is no distension.     Palpations: Abdomen is soft.     Tenderness: There is no abdominal tenderness.  Skin:    General: Skin is warm and dry.     Coloration: Skin is not jaundiced.     Comments: No lacerations to palmar or dorsal aspect of hands.  Neurological:     Mental Status: He is alert. Mental status is at baseline.     Coordination: Coordination normal.     Comments: Follows commands.  Upper and lower extremity pulses are symmetric bilaterally.     ED Results / Procedures / Treatments   Labs (all labs ordered are listed, but only abnormal results are displayed) Labs Reviewed  CBC WITH DIFFERENTIAL/PLATELET - Abnormal; Notable for the following components:  Result Value   RDW 15.7 (*)    All other components within normal limits  COMPREHENSIVE METABOLIC PANEL - Abnormal; Notable for the following components:   Potassium 3.3 (*)    Glucose, Bld 158 (*)    Albumin 3.1 (*)    All other components within normal limits  URINALYSIS, ROUTINE W REFLEX MICROSCOPIC - Abnormal; Notable for the following components:   Color, Urine AMBER (*)    Protein, ur 30 (*)    All other components within normal limits  CK    EKG None  Radiology No results found.  Procedures Procedures    Medications Ordered in ED Medications - No data to display  ED Course/ Medical Decision Making/ A&P                           Medical Decision Making  Patient presents with generalized myalgias and lower extremity weakness/pain.   Differential is broad and includes myalgia, dehydration, AKI, electrolyte derangement.  On exam he is neurovascular intact.  No appreciable focal deficits.  Able to ambulate, able to raise both lower extremities.  I reviewed laboratory work-up.  Per my interpretation not in rhabdo with normal CK, no leukocytosis or anemia.  No gross electrolyte derangement or AKI.  UA is negative.  I reviewed labs the patient is resting comfortably.  Has fever.  I think he is likely fatigued from ambulation yesterday.  Patient unfortunately suffers from unstable housing.  At this time patient is stabilized and appropriate for outpatient follow-up, nothing additional work-up or hospitalization is indicated.        Final Clinical Impression(s) / ED Diagnoses Final diagnoses:  Myalgia    Rx / DC Orders ED Discharge Orders     None         Sherrill Raring, [place] 04/15/22 1657    Tegeler, Gwenyth Allegra, MD 04/15/22 1329

## 2022-04-18 ENCOUNTER — Emergency Department (HOSPITAL_COMMUNITY)
Admission: EM | Admit: 2022-04-18 | Discharge: 2022-04-18 | Disposition: A | Discharge status: Home / Self Care | Payer: No Typology Code available for payment source | Attending: Emergency Medicine | Admitting: Emergency Medicine

## 2022-04-18 ENCOUNTER — Other Ambulatory Visit: Payer: Self-pay

## 2022-04-18 ENCOUNTER — Encounter (HOSPITAL_COMMUNITY): Payer: Self-pay | Admitting: Emergency Medicine

## 2022-04-18 DIAGNOSIS — Z21 Asymptomatic human immunodeficiency virus [HIV] infection status: Secondary | ICD-10-CM | POA: Insufficient documentation

## 2022-04-18 DIAGNOSIS — Z5900 Homelessness unspecified: Secondary | ICD-10-CM | POA: Insufficient documentation

## 2022-04-18 DIAGNOSIS — M79642 Pain in left hand: Secondary | ICD-10-CM | POA: Diagnosis not present

## 2022-04-18 DIAGNOSIS — M79641 Pain in right hand: Secondary | ICD-10-CM | POA: Diagnosis present

## 2022-04-18 MED ORDER — ACETAMINOPHEN 325 MG PO TABS
650.0000 mg | ORAL_TABLET | Freq: Once | ORAL | Status: AC
  Administered 2022-04-18: 650 mg via ORAL
  Filled 2022-04-18: qty 2

## 2022-04-18 MED ORDER — ACETAMINOPHEN ER 650 MG PO TBCR
650.0000 mg | EXTENDED_RELEASE_TABLET | Freq: Three times a day (TID) | ORAL | 0 refills | Status: DC | PRN
Start: 2022-04-18 — End: 2024-01-10

## 2022-04-18 NOTE — ED Provider Notes (Signed)
[facility] DEPT Provider Note   CSN: [id number] Arrival date & time: 04/18/22  0543     History  Chief Complaint  Patient presents with   Frostbite    Blake Chambers is a 31 y.o. male with a past medical history significant for bipolar 1 disorder, HIV, syphilis, chronic renal insufficiency, history of frostbite of both hands who presents to the ED due to bilateral hand pain.  Patient states he has been diagnosed with frostbite previously and notes cold weather exacerbates his pain.  No injury.  No fever or chills.  Denies edema, erythema, and warmth.  Patient unable to tell me where he lives however, per chart review it appears patient is currently homeless.  No other complaints.  History obtained from patient and past medical records. No interpreter used during encounter.       Home Medications Prior to Admission medications   Medication Sig Start Date End Date Taking? Authorizing Provider  acetaminophen (TYLENOL 8 HOUR) 650 MG CR tablet Take 1 tablet (650 mg total) by mouth every 8 (eight) hours as needed for pain. 04/18/22  Yes Karston Hyland C, PA-C  amoxicillin-clavulanate (AUGMENTIN) 875-125 MG tablet Take 1 tablet by mouth every 12 (twelve) hours. 03/07/22   Montine Circle, PA-C  bictegravir-emtricitabine-tenofovir AF (BIKTARVY) 50-200-25 MG TABS tablet Take 1 tablet by mouth daily. 12/21/21   Garald Balding, PA-C  cetirizine (ZYRTEC ALLERGY) 10 MG tablet Take 1 tablet (10 mg total) by mouth daily. 03/07/22   Montine Circle, PA-C  ibuprofen (ADVIL) 600 MG tablet Take 1 tablet (600 mg total) by mouth every 6 (six) hours as needed. Patient not taking: Reported on 11/01/2021 06/30/21   Blanchie Dessert, MD      Allergies    Tenofovir disoproxil and Kiwi extract    Review of Systems   Review of Systems  Constitutional:  Negative for chills and fever.  Musculoskeletal:  Positive for arthralgias and myalgias.  All other systems reviewed and  are negative.   Physical Exam Updated Vital Signs BP 126/78 (BP Location: Left Arm)   Pulse 90   Temp 97.7 F (36.5 C) (Oral)   Resp 18   Ht 5\' 9"  (1.753 m)   Wt 68 kg   SpO2 95%   BMI 22.15 kg/m  Physical Exam Vitals and nursing note reviewed.  Constitutional:      General: He is not in acute distress.    Appearance: He is not ill-appearing.  HENT:     Head: Normocephalic.  Eyes:     Pupils: Pupils are equal, round, and reactive to light.  Cardiovascular:     Rate and Rhythm: Normal rate and regular rhythm.     Pulses: Normal pulses.     Heart sounds: Normal heart sounds. No murmur heard.    No friction rub. No gallop.  Pulmonary:     Effort: Pulmonary effort is normal.     Breath sounds: Normal breath sounds.  Abdominal:     General: Abdomen is flat. There is no distension.     Palpations: Abdomen is soft.     Tenderness: There is no abdominal tenderness. There is no guarding or rebound.  Musculoskeletal:        General: Normal range of motion.     Cervical back: Neck supple.     Comments: Full range of motion of all fingers bilaterally.  Radial pulse intact bilaterally.  No erythema, edema, or warmth.  Full range of motion of bilateral wrists.  Skin:    General: Skin is warm and dry.  Neurological:     General: No focal deficit present.     Mental Status: He is alert.  Psychiatric:        Mood and Affect: Mood normal.        Behavior: Behavior normal.     ED Results / Procedures / Treatments   Labs (all labs ordered are listed, but only abnormal results are displayed) Labs Reviewed - No data to display  EKG None  Radiology No results found.  Procedures Procedures    Medications Ordered in ED Medications  acetaminophen (TYLENOL) tablet 650 mg (has no administration in time range)    ED Course/ Medical Decision Making/ A&P                           Medical Decision Making Risk OTC drugs.   31 year old male presents to the ED due to  bilateral hand pain.  Per patient, he has had frostbite previously in his bilateral hands and notes that cold weather exacerbates his pain.  Patient has been seen in the ED 17 times over the past 6 months due to similar complaints.  Patient is currently homeless. No other physical complaints.  Denies injury.  No fever or chills.  Upon arrival, vitals all within normal limits.  Patient in no acute distress.  Benign physical exam.  Full range of motion of all fingers.  Radial pulse intact bilaterally.  No evidence of infection.  Doubt cellulitis. Low suspicion for septic joints. No trauma to suggest bony fractures.  Patient treated with Tylenol and given food here in the ER.  Patient discharged with community resources for shelters. Strict ED precautions discussed with patient. Patient states understanding and agrees to plan. Patient discharged home in no acute distress and stable vitals        Final Clinical Impression(s) / ED Diagnoses Final diagnoses:  Bilateral hand pain    Rx / DC Orders ED Discharge Orders          Ordered    acetaminophen (TYLENOL 8 HOUR) 650 MG CR tablet  Every 8 hours PRN        04/18/22 0706              Mannie Stabile, PA-C 04/18/22 5093    Margarita Grizzle, MD 04/27/22 1434

## 2022-04-18 NOTE — ED Triage Notes (Signed)
  Patient BIB EMS for pain in bilateral hands.  Patient states he was diagnosed with frostbite before and the cold weather exacerbated the pain.  No SI/HI.  Pain 10/10.

## 2022-04-18 NOTE — Discharge Instructions (Addendum)
It was a pleasure taking care of you today.  As discussed, I am sending you home with Tylenol.  Take as needed for pain.  I am also including community resources.  Return to the ER for new or worsening symptoms.

## 2022-04-24 ENCOUNTER — Encounter (HOSPITAL_COMMUNITY): Payer: Self-pay | Admitting: Emergency Medicine

## 2022-04-24 ENCOUNTER — Emergency Department (HOSPITAL_COMMUNITY)
Admission: EM | Admit: 2022-04-24 | Discharge: 2022-04-24 | Disposition: A | Discharge status: Home / Self Care | Payer: No Typology Code available for payment source | Attending: Emergency Medicine | Admitting: Emergency Medicine

## 2022-04-24 ENCOUNTER — Other Ambulatory Visit: Payer: Self-pay

## 2022-04-24 DIAGNOSIS — Z21 Asymptomatic human immunodeficiency virus [HIV] infection status: Secondary | ICD-10-CM | POA: Diagnosis not present

## 2022-04-24 DIAGNOSIS — Z5900 Homelessness unspecified: Secondary | ICD-10-CM | POA: Insufficient documentation

## 2022-04-24 DIAGNOSIS — G8929 Other chronic pain: Secondary | ICD-10-CM | POA: Diagnosis not present

## 2022-04-24 DIAGNOSIS — M79671 Pain in right foot: Secondary | ICD-10-CM | POA: Diagnosis present

## 2022-04-24 NOTE — ED Triage Notes (Signed)
Patient arrived with EMS from street ( homeless) reports right foot pain this morning , denies injury/ambulatory .

## 2022-04-24 NOTE — ED Provider Notes (Signed)
  [facility] EMERGENCY DEPARTMENT Provider Note   CSN: [id number] Arrival date & time: 04/24/22  5462     History  Chief Complaint  Patient presents with   Foot Pain     Blake Chambers is a 31 y.o. male.  HPI Patient brought in for foot pain.  Acute on chronic.  No injury.  Previous frostbite.  17 visits for similar symptoms recently.  Patient is homeless.   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 Authorizing Provider  acetaminophen (TYLENOL 8 HOUR) 650 MG CR tablet Take 1 tablet (650 mg total) by mouth every 8 (eight) hours as needed for pain. 04/18/22   Mannie Stabile, PA-C  amoxicillin-clavulanate (AUGMENTIN) 875-125 MG tablet Take 1 tablet by mouth every 12 (twelve) hours. 03/07/22   Roxy Horseman, PA-C  bictegravir-emtricitabine-tenofovir AF (BIKTARVY) 50-200-25 MG TABS tablet Take 1 tablet by mouth daily. 12/21/21   Mare Ferrari, PA-C  cetirizine (ZYRTEC ALLERGY) 10 MG tablet Take 1 tablet (10 mg total) by mouth daily. 03/07/22   Roxy Horseman, PA-C  ibuprofen (ADVIL) 600 MG tablet Take 1 tablet (600 mg total) by mouth every 6 (six) hours as needed. Patient not taking: Reported on 11/01/2021 06/30/21   Gwyneth Sprout, MD      Allergies    Tenofovir disoproxil and Kiwi extract    Review of Systems   Review of Systems  Physical Exam Updated Vital Signs BP 112/77 (BP Location: Right Arm)   Pulse 76   Temp (!) 97.4 F (36.3 C) (Oral)   Resp 18   SpO2 100%  Physical Exam Vitals reviewed.  Cardiovascular:     Rate and Rhythm: Regular rhythm.  Musculoskeletal:     Comments:  mild tenderness to right foot.  Skin overall intact.   Pulse intact.  Neurological:     Mental Status: He is alert.     ED Results / Procedures / Treatments   Labs (all labs ordered are listed, but only abnormal results are displayed) Labs Reviewed - No data to display  EKG None  Radiology No results found.  Procedures Procedures    Medications Ordered in ED Medications - No data to display  ED Course/ Medical Decision Making/ A&P                           Medical Decision Making  Patient with chronic foot pain.  Previous frostbite.  Does not appear infected.  No deformity.  Does have a pulse.  Appears stable for outpatient follow-up.  Doubt acute injury.        Final Clinical Impression(s) / ED Diagnoses Final diagnoses:  None    Rx / DC Orders ED Discharge Orders     None         Benjiman Core, MD 04/24/22 [phone]

## 2022-04-29 ENCOUNTER — Ambulatory Visit: Payer: Medicaid Other | Admitting: Infectious Diseases

## 2022-05-14 ENCOUNTER — Encounter (HOSPITAL_COMMUNITY): Payer: Self-pay | Admitting: Emergency Medicine

## 2022-05-14 ENCOUNTER — Other Ambulatory Visit: Payer: Self-pay

## 2022-05-14 ENCOUNTER — Emergency Department (HOSPITAL_COMMUNITY)
Admission: EM | Admit: 2022-05-14 | Discharge: 2022-05-14 | Disposition: A | Discharge status: Home / Self Care | Payer: No Typology Code available for payment source | Attending: Emergency Medicine | Admitting: Emergency Medicine

## 2022-05-14 ENCOUNTER — Other Ambulatory Visit (HOSPITAL_COMMUNITY): Payer: Self-pay

## 2022-05-14 DIAGNOSIS — M79671 Pain in right foot: Secondary | ICD-10-CM | POA: Diagnosis not present

## 2022-05-14 DIAGNOSIS — B20 Human immunodeficiency virus [HIV] disease: Secondary | ICD-10-CM | POA: Diagnosis not present

## 2022-05-14 DIAGNOSIS — G8929 Other chronic pain: Secondary | ICD-10-CM | POA: Diagnosis not present

## 2022-05-14 DIAGNOSIS — M79674 Pain in right toe(s): Secondary | ICD-10-CM | POA: Diagnosis present

## 2022-05-14 DIAGNOSIS — F17210 Nicotine dependence, cigarettes, uncomplicated: Secondary | ICD-10-CM | POA: Insufficient documentation

## 2022-05-14 MED ORDER — IBUPROFEN 800 MG PO TABS
800.0000 mg | ORAL_TABLET | Freq: Once | ORAL | Status: AC
  Administered 2022-05-14: 800 mg via ORAL
  Filled 2022-05-14: qty 1

## 2022-05-14 MED ORDER — METHOCARBAMOL 500 MG PO TABS
1000.0000 mg | ORAL_TABLET | Freq: Two times a day (BID) | ORAL | 0 refills | Status: AC
  Filled 2022-05-14: qty 20, 5d supply, fill #0

## 2022-05-14 MED ORDER — ACETAMINOPHEN 325 MG PO TABS
650.0000 mg | ORAL_TABLET | Freq: Once | ORAL | Status: AC
  Administered 2022-05-14: 650 mg via ORAL
  Filled 2022-05-14: qty 2

## 2022-05-14 NOTE — ED Provider Notes (Signed)
[facility]-EMERGENCY DEPT Provider Note   CSN: [id number] Arrival date & time: 05/14/22  0403     History  Chief Complaint  Patient presents with   Toe Pain    Blake Chambers is a 31 y.o. male.  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 No language interpreter was used.  Toe Pain Pertinent negatives include no chest pain, no abdominal pain, no headaches and no shortness of breath.       Home Medications Prior to Admission medications   Medication Sig Start Date End Date Taking? Authorizing Provider  methocarbamol (ROBAXIN) 500 MG tablet Take 2 tablets (1,000 mg total) by mouth 2 (two) times daily for 5 days. 05/14/22 05/19/22 Yes Sloan Leiter, DO  acetaminophen (TYLENOL 8 HOUR) 650 MG CR tablet Take 1 tablet (650 mg total) by mouth every 8 (eight) hours as needed for pain. 04/18/22   Mannie Stabile, PA-C  amoxicillin-clavulanate (AUGMENTIN) 875-125 MG tablet Take 1 tablet by mouth every 12 (twelve) hours. 03/07/22   Roxy Horseman, PA-C   bictegravir-emtricitabine-tenofovir AF (BIKTARVY) 50-200-25 MG TABS tablet Take 1 tablet by mouth daily. 12/21/21   Mare Ferrari, PA-C  cetirizine (ZYRTEC ALLERGY) 10 MG tablet Take 1 tablet (10 mg total) by mouth daily. 03/07/22   Roxy Horseman, PA-C  ibuprofen (ADVIL) 600 MG tablet Take 1 tablet (600 mg total) by mouth every 6 (six) hours as needed. Patient not taking: Reported on 11/01/2021 06/30/21   Gwyneth Sprout, MD      Allergies    Tenofovir disoproxil and Kiwi extract    Review of Systems   Review of Systems  Constitutional:  Negative for chills and fever.  HENT:  Negative for facial swelling and trouble swallowing.   Eyes:  Negative for photophobia and visual disturbance.  Respiratory:  Negative for cough and shortness of breath.   Cardiovascular:  Negative for chest pain and palpitations.  Gastrointestinal:  Negative for abdominal pain, nausea and vomiting.  Endocrine: Negative for polydipsia and polyuria.  Genitourinary:  Negative for difficulty urinating and hematuria.  Musculoskeletal:  Positive for arthralgias. Negative for gait problem and joint swelling.  Skin:  Negative for pallor and rash.  Neurological:  Negative for syncope and headaches.  Psychiatric/Behavioral:  Negative for agitation and confusion.     Physical Exam Updated Vital Signs BP 119/71 (BP Location: Left Arm)   Pulse 89   Temp 97.8 F (36.6 C)   Resp 16   SpO2 98%  Physical Exam Vitals and nursing  note reviewed.  Constitutional:      General: He is not in acute distress.    Appearance: Normal appearance. He is well-developed. He is not ill-appearing, toxic-appearing or diaphoretic.  HENT:     Head: Normocephalic and atraumatic.     Right Ear: External ear normal.     Left Ear: External ear normal.     Mouth/Throat:     Mouth: Mucous membranes are moist.  Eyes:     General: No scleral icterus. Cardiovascular:     Rate and Rhythm: Normal rate.  Pulmonary:     Effort: Pulmonary  effort is normal. No respiratory distress.  Abdominal:     General: Abdomen is flat. There is no distension.  Musculoskeletal:        General: Normal range of motion.     Cervical back: Normal range of motion.     Right lower leg: No edema.     Left lower leg: No edema.       Feet:  Feet:     Comments: No wound or ttp, DP 2+, cap refill is brisk, achilles tendon intact, full rom to ankle. NVI RLE Skin:    General: Skin is warm and dry.     Capillary Refill: Capillary refill takes less than 2 seconds.  Neurological:     Mental Status: He is alert and oriented to person, place, and time.     GCS: GCS eye subscore is 4. GCS verbal subscore is 5. GCS motor subscore is 6.  Psychiatric:        Mood and Affect: Mood normal.        Behavior: Behavior normal.     ED Results / Procedures / Treatments   Labs (all labs ordered are listed, but only abnormal results are displayed) Labs Reviewed - No data to display  EKG None  Radiology No results found.  Procedures Procedures    Medications Ordered in ED Medications  ibuprofen (ADVIL) tablet 800 mg (has no administration in time range)  acetaminophen (TYLENOL) tablet 650 mg (has no administration in time range)    ED Course/ Medical Decision Making/ A&P                           Medical Decision Making Risk OTC drugs. Prescription drug management.   This patient presents to the ED with chief complaint(s) of foot cramp with pertinent past medical history of above which further complicates the presenting complaint. The complaint involves an extensive differential diagnosis and also carries with it a high risk of complications and morbidity.    The differential diagnosis includes but not limited to cramp, strain, sprain, less likely fx. Serious etiologies were considered.   The initial plan is to analgesia  Currently pain free  Additional history obtained: Additional history obtained from  na Records reviewed  prior ed  visits  prior labs/imaging  Independent labs interpretation:  The following labs were independently interpreted: na  Independent visualization of imaging: Cardiac monitoring was reviewed and interpreted by myself which shows na  Treatment and Reassessment: Motrin Tylenol >> asymptomatic   Consultation: - Consulted or discussed management/test interpretation w/ external professional: na  Consideration for admission or further workup: Admission was considered   Pt homeless, here with recurrent right foot cramping feeling. No pain currently. Exam is stable, no obvious signs of injury, foot is NVI. Will give apap/motrin and trial robaxin at home for muscle cramp. Given o/p resource info, pcp  f/u info. Gait steady. No LE edema   The patient improved significantly and was discharged in stable condition. Detailed discussions were had with the patient regarding current findings, and need for close f/u with PCP or on call doctor. The patient has been instructed to return immediately if the symptoms worsen in any way for re-evaluation. Patient verbalized understanding and is in agreement with current care plan. All questions answered prior to discharge.    Social Determinants of health: Homeless Tobacco Illicit drug use etoh >> encourage cessation of tobacco and illicit drugs, etoh Social History   Tobacco Use   Smoking status: Some Days    Packs/day: 0.30    Types: Cigarettes   Smokeless tobacco: Never   Tobacco comments:    pt. trying to quit  Vaping Use   Vaping Use: Never used  Substance Use Topics   Alcohol use: Not Currently    Alcohol/week: 1.0 standard drink of alcohol    Types: 1 Standard drinks or equivalent per week   Drug use: Not Currently    Frequency: 1.0 times per week    Types: Marijuana, Cocaine, Methamphetamines            Final Clinical Impression(s) / ED Diagnoses Final diagnoses:  Chronic pain in right foot    Rx / DC Orders ED Discharge  Orders          Ordered    methocarbamol (ROBAXIN) 500 MG tablet  2 times daily        05/14/22 0816              Sloan Leiter, DO 05/14/22 [phone]

## 2022-05-14 NOTE — Discharge Instructions (Signed)
It was a pleasure caring for you today in the emergency department. ° °Please return to the emergency department for any worsening or worrisome symptoms. ° ° °

## 2022-05-14 NOTE — ED Triage Notes (Signed)
BIBA Per EMS: pt coming from street w/ c/o toe pain. Pt worried about frostbite. Hx same. Denies any other complaints.  130/88 88 HR 14RR 98%RA

## 2022-05-19 ENCOUNTER — Emergency Department (HOSPITAL_COMMUNITY): Payer: No Typology Code available for payment source

## 2022-05-19 ENCOUNTER — Encounter (HOSPITAL_COMMUNITY): Payer: Self-pay | Admitting: Emergency Medicine

## 2022-05-19 ENCOUNTER — Inpatient Hospital Stay (HOSPITAL_COMMUNITY)
Admission: EM | Admit: 2022-05-19 | Discharge: 2022-05-21 | DRG: 974 | Disposition: A | Discharge status: Home / Self Care | Payer: No Typology Code available for payment source | Attending: Internal Medicine | Admitting: Internal Medicine

## 2022-05-19 ENCOUNTER — Other Ambulatory Visit: Payer: Self-pay

## 2022-05-19 ENCOUNTER — Other Ambulatory Visit (HOSPITAL_COMMUNITY)
Admission: RE | Admit: 2022-05-19 | Discharge: 2022-05-19 | Disposition: A | Discharge status: Home / Self Care | Payer: No Typology Code available for payment source | Source: Ambulatory Visit

## 2022-05-19 ENCOUNTER — Other Ambulatory Visit (INDEPENDENT_AMBULATORY_CARE_PROVIDER_SITE_OTHER): Payer: No Typology Code available for payment source

## 2022-05-19 VITALS — BP 91/62 | HR 99 | Temp 100.7°F

## 2022-05-19 DIAGNOSIS — E162 Hypoglycemia, unspecified: Secondary | ICD-10-CM | POA: Diagnosis present

## 2022-05-19 DIAGNOSIS — J189 Pneumonia, unspecified organism: Secondary | ICD-10-CM | POA: Diagnosis present

## 2022-05-19 DIAGNOSIS — F419 Anxiety disorder, unspecified: Secondary | ICD-10-CM | POA: Diagnosis present

## 2022-05-19 DIAGNOSIS — F319 Bipolar disorder, unspecified: Secondary | ICD-10-CM | POA: Diagnosis present

## 2022-05-19 DIAGNOSIS — B20 Human immunodeficiency virus [HIV] disease: Secondary | ICD-10-CM

## 2022-05-19 DIAGNOSIS — R6510 Systemic inflammatory response syndrome (SIRS) of non-infectious origin without acute organ dysfunction: Principal | ICD-10-CM

## 2022-05-19 DIAGNOSIS — R509 Fever, unspecified: Secondary | ICD-10-CM

## 2022-05-19 DIAGNOSIS — F17200 Nicotine dependence, unspecified, uncomplicated: Secondary | ICD-10-CM | POA: Diagnosis present

## 2022-05-19 DIAGNOSIS — J1000 Influenza due to other identified influenza virus with unspecified type of pneumonia: Secondary | ICD-10-CM | POA: Diagnosis present

## 2022-05-19 DIAGNOSIS — Z888 Allergy status to other drugs, medicaments and biological substances status: Secondary | ICD-10-CM | POA: Diagnosis not present

## 2022-05-19 DIAGNOSIS — A419 Sepsis, unspecified organism: Secondary | ICD-10-CM | POA: Diagnosis not present

## 2022-05-19 DIAGNOSIS — Z1152 Encounter for screening for COVID-19: Secondary | ICD-10-CM | POA: Diagnosis not present

## 2022-05-19 DIAGNOSIS — Z91018 Allergy to other foods: Secondary | ICD-10-CM | POA: Diagnosis not present

## 2022-05-19 DIAGNOSIS — A4189 Other specified sepsis: Secondary | ICD-10-CM | POA: Diagnosis present

## 2022-05-19 DIAGNOSIS — Z113 Encounter for screening for infections with a predominantly sexual mode of transmission: Secondary | ICD-10-CM

## 2022-05-19 DIAGNOSIS — J111 Influenza due to unidentified influenza virus with other respiratory manifestations: Secondary | ICD-10-CM | POA: Diagnosis not present

## 2022-05-19 DIAGNOSIS — Z91199 Patient's noncompliance with other medical treatment and regimen due to unspecified reason: Secondary | ICD-10-CM

## 2022-05-19 DIAGNOSIS — Z79899 Other long term (current) drug therapy: Secondary | ICD-10-CM | POA: Diagnosis not present

## 2022-05-19 DIAGNOSIS — F17210 Nicotine dependence, cigarettes, uncomplicated: Secondary | ICD-10-CM | POA: Diagnosis present

## 2022-05-19 LAB — CBC WITH DIFFERENTIAL/PLATELET
Abs Immature Granulocytes: 0.08 10*3/uL — ABNORMAL HIGH (ref 0.00–0.07)
Basophils Absolute: 0 10*3/uL (ref 0.0–0.1)
Basophils Relative: 0 %
Eosinophils Absolute: 0 10*3/uL (ref 0.0–0.5)
Eosinophils Relative: 0 %
HCT: 41.7 % (ref 39.0–52.0)
Hemoglobin: 13.5 g/dL (ref 13.0–17.0)
Immature Granulocytes: 1 %
Lymphocytes Relative: 19 %
Lymphs Abs: 1.5 10*3/uL (ref 0.7–4.0)
MCH: 25.8 pg — ABNORMAL LOW (ref 26.0–34.0)
MCHC: 32.4 g/dL (ref 30.0–36.0)
MCV: 79.7 fL — ABNORMAL LOW (ref 80.0–100.0)
Monocytes Absolute: 0.9 10*3/uL (ref 0.1–1.0)
Monocytes Relative: 11 %
Neutro Abs: 5.7 10*3/uL (ref 1.7–7.7)
Neutrophils Relative %: 69 %
Platelets: 306 10*3/uL (ref 150–400)
RBC: 5.23 MIL/uL (ref 4.22–5.81)
RDW: 15.2 % (ref 11.5–15.5)
WBC: 8.1 10*3/uL (ref 4.0–10.5)
nRBC: 0 % (ref 0.0–0.2)

## 2022-05-19 LAB — BASIC METABOLIC PANEL
Anion gap: 8 (ref 5–15)
BUN: 12 mg/dL (ref 6–20)
CO2: 25 mmol/L (ref 22–32)
Calcium: 8.6 mg/dL — ABNORMAL LOW (ref 8.9–10.3)
Chloride: 98 mmol/L (ref 98–111)
Creatinine, Ser: 1.24 mg/dL (ref 0.61–1.24)
GFR, Estimated: >60 mL/min (ref >60)
Glucose, Bld: 98 mg/dL (ref 70–99)
Potassium: 3.7 mmol/L (ref 3.5–5.1)
Sodium: 131 mmol/L — ABNORMAL LOW (ref 135–145)

## 2022-05-19 LAB — POCT INFLUENZA A/B
Influenza A, POC: NEGATIVE
Influenza B, POC: NEGATIVE

## 2022-05-19 LAB — CBG MONITORING, ED
Glucose-Capillary: 45 mg/dL — ABNORMAL LOW (ref 70–99)
Glucose-Capillary: 122 mg/dL — ABNORMAL HIGH (ref 70–99)

## 2022-05-19 LAB — LACTIC ACID, PLASMA: Lactic Acid, Venous: 1.3 mmol/L (ref 0.5–1.9)

## 2022-05-19 MED ORDER — ENOXAPARIN SODIUM 40 MG/0.4ML IJ SOSY
40.0000 mg | PREFILLED_SYRINGE | INTRAMUSCULAR | Status: DC
  Administered 2022-05-20: 40 mg via SUBCUTANEOUS
  Filled 2022-05-19 (×2): qty 0.4

## 2022-05-19 MED ORDER — SODIUM CHLORIDE 0.9 % IV SOLN
1.0000 g | Freq: Once | INTRAVENOUS | Status: AC
  Administered 2022-05-19: 1 g via INTRAVENOUS
  Filled 2022-05-19: qty 10

## 2022-05-19 MED ORDER — ONDANSETRON HCL 4 MG PO TABS
4.0000 mg | ORAL_TABLET | Freq: Four times a day (QID) | ORAL | Status: DC | PRN
  Filled 2022-05-19: qty 1

## 2022-05-19 MED ORDER — SODIUM CHLORIDE 0.9 % IV SOLN
2.0000 g | INTRAVENOUS | Status: DC
  Administered 2022-05-20: 2 g via INTRAVENOUS
  Filled 2022-05-19: qty 20

## 2022-05-19 MED ORDER — ONDANSETRON HCL 4 MG/2ML IJ SOLN
4.0000 mg | Freq: Four times a day (QID) | INTRAMUSCULAR | Status: DC | PRN
  Administered 2022-05-20: 4 mg via INTRAVENOUS
  Filled 2022-05-19: qty 2

## 2022-05-19 MED ORDER — ACETAMINOPHEN 325 MG PO TABS
650.0000 mg | ORAL_TABLET | Freq: Once | ORAL | Status: AC
  Administered 2022-05-19: 650 mg via ORAL
  Filled 2022-05-19: qty 2

## 2022-05-19 MED ORDER — LACTATED RINGERS IV SOLN: INTRAVENOUS | Status: DC

## 2022-05-19 MED ORDER — SODIUM CHLORIDE 0.9 % IV BOLUS
1000.0000 mL | Freq: Once | INTRAVENOUS | Status: AC
  Administered 2022-05-20: 1000 mL via INTRAVENOUS

## 2022-05-19 MED ORDER — SODIUM CHLORIDE 0.9 % IV SOLN
500.0000 mg | Freq: Once | INTRAVENOUS | Status: AC
  Administered 2022-05-19: 500 mg via INTRAVENOUS
  Filled 2022-05-19: qty 5

## 2022-05-19 MED ORDER — SODIUM CHLORIDE 0.9 % IV SOLN
500.0000 mg | INTRAVENOUS | Status: DC
  Administered 2022-05-20: 500 mg via INTRAVENOUS
  Filled 2022-05-19: qty 5

## 2022-05-19 MED ORDER — DEXTROSE IN LACTATED RINGERS 5 % IV SOLN: INTRAVENOUS | Status: DC

## 2022-05-19 MED ORDER — ACETAMINOPHEN 325 MG PO TABS
650.0000 mg | ORAL_TABLET | Freq: Three times a day (TID) | ORAL | Status: DC | PRN
  Administered 2022-05-21: 650 mg via ORAL
  Filled 2022-05-19: qty 2

## 2022-05-19 MED ORDER — BICTEGRAVIR-EMTRICITAB-TENOFOV 50-200-25 MG PO TABS: 1.0000 | ORAL_TABLET | Freq: Every day | ORAL | Status: DC

## 2022-05-19 NOTE — H&P (Addendum)
History and Physical    Patient: Blake Chambers TDD:[id number] DOB: Jan 03, 1991 DOA: 05/19/2022 DOS: the patient was seen and examined on 05/19/2022 PCP: Gardiner Barefoot, MD  Patient coming from: Homeless  Chief Complaint: "I hurt all over" Patient was very uncooperative during my evaluation HPI: Blake Chambers is a 31 y.o. male with medical history significant for bipolar disorder, HIV, nicotine dependence who was sent to the ER from the infectious disease clinic for evaluation of a cough productive of greenish phlegm, fever, chills and weakness. Patient had gone to the ID clinic with complaints of feeling unwell.  He reports having a cough productive of greenish phlegm associated with congestion and fever.  At the clinic he had a Tmax of 100.4 with a blood pressure of 97/62. Rapid flu AMB were done in the clinic and was negative. Patient was transported to the ER for further evaluation. Unable to do review of systems because he continues to moan in pain asking to be left alone that he wanted to rest. Vital signs in the ER showed a Tmax of 101.1, he was tachycardic with heart rate between 99 and 101 and respiratory rate of 34 Chest x-ray showed findings are concerning for multifocal pneumonia. He received a dose of IV Rocephin and Zithromax and will be admitted to the hospital for further evaluation.    Review of Systems: Unable to review all systems due to lack of cooperation from patient. 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 He has been smoking an average of .3 packs per day. He has never used smokeless tobacco. He reports that he does not currently use alcohol after a past usage of about 1.0 standard drink of alcohol per week. He reports that he does not currently use drugs after having used the following drugs: Marijuana, Cocaine, and Methamphetamines. Frequency: 1.00 time per week.  Allergies  Allergen Reactions   Tenofovir Disoproxil Other (See Comments)    Intolerance, increase in creatinine   (TRUVADA)    Kiwi Extract Swelling    Family History  Family history unknown: Yes    Prior to Admission medications   Medication Sig Start Date End Date Taking? Authorizing Provider  acetaminophen (TYLENOL 8 HOUR) 650 MG CR tablet Take 1 tablet (650 mg total) by mouth every 8 (eight) hours as needed for pain. 04/18/22   Mannie Stabile, PA-C  amoxicillin-clavulanate (AUGMENTIN) 875-125 MG tablet Take 1 tablet by mouth every 12 (twelve) hours. 03/07/22   Roxy Horseman, PA-C  bictegravir-emtricitabine-tenofovir AF (BIKTARVY) 50-200-25 MG TABS tablet Take 1 tablet by mouth daily. 12/21/21   Mare Ferrari, PA-C  cetirizine (ZYRTEC ALLERGY) 10 MG tablet Take 1 tablet (10 mg total) by mouth daily. 03/07/22   Roxy Horseman, PA-C  ibuprofen (ADVIL) 600 MG tablet Take 1 tablet (600 mg total) by mouth every 6 (six) hours as needed. Patient not taking: Reported on 11/01/2021 06/30/21   Gwyneth Sprout, MD  methocarbamol (ROBAXIN) 500 MG tablet Take 2 tablets (1,000 mg total) by mouth 2 (two) times daily for 5 days. 05/14/22 05/19/22  Sloan Leiter, DO    Physical Exam: Vitals:   05/19/22 2025 05/19/22 2045 05/19/22 2130 05/19/22 2200  BP: 112/67 107/70 114/75 110/73  Pulse: 94 98 99 (!) 105  Resp: 17 (!) 30 (!) 34 (!) 33  Temp:      TempSrc:      SpO2: 100% 99% 99% 99%  Weight:      Height:       Physical Exam Vitals and nursing note  reviewed.  Constitutional:      Comments: Sleeping but arouses to verbal stimuli.  Moaning continuously and requesting to be left alone  HENT:     Head: Normocephalic and atraumatic.     Nose: Nose normal.     Mouth/Throat:     Mouth: Mucous membranes are moist.  Eyes:     Conjunctiva/sclera: Conjunctivae normal.  Cardiovascular:     Rate and Rhythm: Tachycardia present.  Pulmonary:     Breath sounds: Rhonchi present.     Comments: Scattered rhonchi Abdominal:     General: Bowel sounds are normal.     Palpations: Abdomen is soft.     Comments: Diffusely tender  Musculoskeletal:        General: Normal range of motion.     Cervical back: Normal range of motion and neck supple.  Skin:    General: Skin is warm and dry.  Neurological:     General: No focal deficit present.  Psychiatric:     Comments: Depressed mood, flat affect     Data Reviewed: Relevant notes from primary care and specialist visits, past discharge summaries as available in EHR, including Care Everywhere. Prior diagnostic testing as pertinent to current admission diagnoses Updated medications and problem lists for reconciliation ED course, including vitals, labs, imaging, treatment and response to treatment Triage notes, nursing and pharmacy notes and ED provider's notes Notable results as noted in HPI Labs reviewed.  Sodium 131, potassium 3.7, chloride 98, bicarb 25, glucose 98, BUN 12, creatinine 1.24, calcium 8.6, lactic acid 1.3, white count 8.1, hemoglobin 13.5, hematocrit 41.7, platelet count 306 There are no new results to review at this time.  Assessment and Plan: * Sepsis due to pneumonia (HCC) Sepsis present on admission as evidenced by fever with a Tmax of 101.1, tachycardia, tachypnea and chest x-ray findings suggestive of multifocal pneumonia Aggressive IV fluid resuscitation Place patient empirically on Rocephin and Zithromax Follow-up results of blood and sputum cultures  Tobacco use  disorder Smoking cessation was discussed with patient States that he has not smoked in 2 weeks because he has felt unwell Declines a nicotine transdermal patch  HIV disease (HCC) Noncompliant with HAART therapy Hold Biktarvy  Hypoglycemia Probably related to poor oral intake No known history of diabetes mellitus Place patient on dextrose containing fluids Check blood sugars every 4 hours and if stable will discontinue blood sugar checks  Bipolar I disorder (HCC) Not on any medications Will need referral to psychiatry as an outpatient      Advance Care Planning:   Code Status: Full Code   Consults: None  Family Communication: Greater than 50% of time was spent discussing plan of care with patient at the bedside.  Severity of Illness: The appropriate patient status for this patient is INPATIENT. Inpatient status is judged to be reasonable and necessary in order to provide the required intensity of service  to ensure the patient's safety. The patient's presenting symptoms, physical exam findings, and initial radiographic and laboratory data in the context of their chronic comorbidities is felt to place them at high risk for further clinical deterioration. Furthermore, it is not anticipated that the patient will be medically stable for discharge from the hospital within 2 midnights of admission.   * I certify that at the point of admission it is my clinical judgment that the patient will require inpatient hospital care spanning beyond 2 midnights from the point of admission due to high intensity of service, high risk for further deterioration and high frequency of surveillance required.*  Author: Lucile Shutters, MD 05/19/2022 10:42 PM  For on call review [email or web address].

## 2022-05-19 NOTE — Assessment & Plan Note (Signed)
Sepsis present on admission as evidenced by fever with a Tmax of 101.1, tachycardia, tachypnea and chest x-ray findings suggestive of multifocal pneumonia Aggressive IV fluid resuscitation Place patient empirically on Rocephin and Zithromax Follow-up results of blood and sputum cultures

## 2022-05-19 NOTE — Assessment & Plan Note (Signed)
Probably related to poor oral intake No known history of diabetes mellitus Place patient on dextrose containing fluids Check blood sugars every 4 hours and if stable will discontinue blood sugar checks

## 2022-05-19 NOTE — ED Provider Notes (Signed)
[name] EMERGENCY DEPARTMENT Provider Note   CSN: [id number] Arrival date & time: 05/19/22  1535     History  No chief complaint on file.   Blake Chambers is a 31 y.o. male.  HPI Adult male with HIV, not currently taking his medications, no prophylactic antibiotics presents with cough, fever, chills, weakness.  Onset was possibly few weeks ago, symptoms have been progressive.  Today went to the infectious disease clinic and was sent here for evaluation.    Home Medications Prior to Admission medications   Medication Sig Start Date End Date Taking? Authorizing Provider  acetaminophen (TYLENOL 8 HOUR) 650 MG CR tablet Take 1 tablet (650 mg total) by mouth every 8 (eight) hours as needed for pain. 04/18/22   Mannie Stabile, PA-C  amoxicillin-clavulanate (AUGMENTIN) 875-125 MG tablet Take 1 tablet by mouth every 12 (twelve) hours. 03/07/22   Roxy Horseman, PA-C  bictegravir-emtricitabine-tenofovir AF (BIKTARVY) 50-200-25 MG TABS tablet Take 1 tablet by mouth daily. 12/21/21   Mare Ferrari, PA-C  cetirizine (ZYRTEC ALLERGY) 10 MG tablet Take 1 tablet (10 mg total) by mouth daily. 03/07/22   Roxy Horseman, PA-C  ibuprofen (ADVIL) 600 MG tablet Take 1 tablet (600 mg total) by mouth every 6 (six) hours as needed. Patient not taking: Reported on 11/01/2021 06/30/21   Gwyneth Sprout, MD  methocarbamol (ROBAXIN) 500 MG tablet Take 2 tablets (1,000 mg total) by mouth 2 (two) times daily for 5 days. 05/14/22 05/19/22  Sloan Leiter, DO      Allergies    Tenofovir disoproxil and Kiwi extract    Review of Systems   Review of Systems  All other systems reviewed and are negative.   Physical Exam Updated Vital Signs BP 114/75   Pulse 99   Temp 99.6 F (37.6 C) (Oral)   Resp (!) 34   Ht 5\' 9"  (1.753 m)   Wt 68 kg   SpO2 99%   BMI 22.14 kg/m  Physical Exam Vitals and nursing note reviewed.  Constitutional:      General: He is not in acute distress.     Appearance: He is well-developed.  HENT:     Head: Normocephalic and atraumatic.  Eyes:     Conjunctiva/sclera: Conjunctivae normal.  Cardiovascular:     Rate and Rhythm: Regular rhythm. Tachycardia present.  Pulmonary:     Effort: Tachypnea and accessory muscle usage present.     Breath sounds: No stridor.  Abdominal:     General: There is no distension.  Skin:    General: Skin is warm and dry.  Neurological:     Mental Status: He is alert and oriented to person, place, and time.     ED Results / Procedures / Treatments   Labs (all labs ordered are listed, but only abnormal results are displayed) Labs Reviewed  BASIC METABOLIC PANEL - Abnormal; Notable for the following components:      Result Value   Sodium 131 (*)    Calcium 8.6 (*)    All other components within normal limits  CBC WITH DIFFERENTIAL/PLATELET - Abnormal; Notable for the following components:   MCV 79.7 (*)    MCH 25.8 (*)    Abs Immature Granulocytes 0.08 (*)    All other components within normal limits  CBG MONITORING, ED - Abnormal; Notable for the following components:   Glucose-Capillary 45 (*)    All other components within normal limits  CBG MONITORING, ED - Abnormal; Notable for the  following components:   Glucose-Capillary 122 (*)    All other components within normal limits  CULTURE, BLOOD (ROUTINE X 2)  CULTURE, BLOOD (ROUTINE X 2)  RESP PANEL BY RT-PCR (FLU A&B, COVID) ARPGX2  SARS CORONAVIRUS 2 BY RT PCR  LACTIC ACID, PLASMA  LACTIC ACID, PLASMA    EKG None  Radiology DG Chest 2 View  Result Date: 05/19/2022 CLINICAL DATA:  Cough and fever for 2 weeks.  Rule out pneumonia. EXAM: CHEST - 2 VIEW COMPARISON:  02/04/2022 FINDINGS: Airspace densities at the medial and posterior right lung base. Concern for subtle airspace densities at the left lung base. Upper lungs are clear. Heart size is normal. No large pleural effusions. No acute bone abnormality. IMPRESSION: Bibasilar lung  densities, right side greater than left. Findings are concerning for multifocal pneumonia. Electronically Signed   By: Richarda Overlie M.D.   On: 05/19/2022 17:36    Procedures Procedures    Medications Ordered in ED Medications  azithromycin (ZITHROMAX) 500 mg in sodium chloride 0.9 % 250 mL IVPB (500 mg Intravenous New Bag/Given 05/19/22 2055)  acetaminophen (TYLENOL) tablet 650 mg (650 mg Oral Given 05/19/22 1605)  cefTRIAXone (ROCEPHIN) 1 g in sodium chloride 0.9 % 100 mL IVPB (0 g Intravenous Stopped 05/19/22 2052)    ED Course/ Medical Decision Making/ A&P                           Medical Decision Making 31 year old male with HIV, noncompliant with his medications presents with cough, fever, chills.  Patient is found to be tachypneic, tachycardic, but without new oxygen requirement.  Suspicion for pneumonia versus SIRS versus sepsis given his immunocompromise state.  Labs x-ray ordered from triage  Amount and/or Complexity of Data Reviewed External Data Reviewed: notes.    Details: Multiple prior ED visits, including 16 within the past 6 months Labs:  Decision-making details documented in ED Course.    Details: Electrolytes unremarkable Radiology:  Decision-making details documented in ED Course.    Details: Bilateral pneumonia on x-ray  Risk Decision regarding hospitalization.   Adult male with HIV, not taking medications presents tachypneic, tachycardic, immediate concern for infection given his immunocompromise state.  Patient found to have bilateral pneumonia, concern for SIRS versus sepsis he was admitted for further monitoring, management.  Patient has not been hospitalized recently, started on ceftriaxone, azithromycin. Final Clinical Impression(s) / ED Diagnoses Final diagnoses:  SIRS (systemic inflammatory response syndrome) (HCC)  Multifocal pneumonia   CRITICAL CARE Performed by: Gerhard Munch Total critical care time: 35 minutes Critical care time was exclusive of  separately billable procedures and treating other patients. Critical care was necessary to treat or prevent imminent or life-threatening deterioration. Critical care was time spent personally by me on the following activities: development of treatment plan with patient and/or surrogate as well as nursing, discussions with consultants, evaluation of patient's response to treatment, examination of patient, obtaining history from patient or surrogate, ordering and performing treatments and interventions, ordering and review of laboratory studies, ordering and review of radiographic studies, pulse oximetry and re-evaluation of patient's condition.    Gerhard Munch, MD 05/19/22 2138

## 2022-05-19 NOTE — ED Triage Notes (Signed)
Pt reports cough and fever for 2 weeks. Pt seen here a few days ago and states he didn't mention the symptoms. Endorses generalized pain. Pt HIV positive but not taking medication.

## 2022-05-19 NOTE — ED Notes (Signed)
Called pts name 3x to be roomed with no response.  

## 2022-05-19 NOTE — Addendum Note (Signed)
Addended by: Harley Alto on: 05/19/2022 02:48 PM   Modules accepted: Orders

## 2022-05-19 NOTE — Assessment & Plan Note (Addendum)
Noncompliant with HAART therapy Hold [employer]

## 2022-05-19 NOTE — ED Notes (Signed)
Pt was asleep in lobby. Pt awake now.

## 2022-05-19 NOTE — Assessment & Plan Note (Signed)
Not on any medications Will need referral to psychiatry as an outpatient

## 2022-05-19 NOTE — ED Provider Triage Note (Signed)
Emergency Medicine Provider Triage Evaluation Note  Blake Chambers , a 31 y.o. male  was evaluated in triage.  Pt complains of cough and fever. States symptoms have been present for 2 weeks. Endorses diarrhea that stopped yesterday. He denies sick contacts. He is HIV+ not on HAART therapy. States he saw ID today.   Review of Systems  Positive: See above Negative:   Physical Exam  Ht 5\' 9"  (1.753 m)   Wt 68 kg   BMI 22.14 kg/m  Gen:   Awake, ill appearing  Resp:  Normal effort, productive cough MSK:   Moves extremities without difficulty  Other:    Medical Decision Making  Medically screening exam initiated at 4:00 PM.  Appropriate orders placed.  Blake Chambers was informed that the remainder of the evaluation will be completed by another provider, this initial triage assessment does not replace that evaluation, and the importance of remaining in the ED until their evaluation is complete.  Given immunocompromise, not on therapy, will order cultures. Febrile here to 101.1 .   Naoma Diener, PA-C 05/19/22 [phone]

## 2022-05-19 NOTE — Progress Notes (Addendum)
Patient in the office today for routine labs.  Patient requested to speak with nurse due to not feeling well.  Patient reports feeling "bad" for the past week. Reports cough, congestions and have low grade temp today 100.4. BP 97/62, P 99, SpO2 96% on room air. Rapid Flu A/B negative. Patient requests to go to ED for follow up workup. Patient transported via wheelchair to ED by staff.  Valarie Cones, LPN

## 2022-05-19 NOTE — Assessment & Plan Note (Signed)
Smoking cessation was discussed with patient States that he has not smoked in 2 weeks because he has felt unwell Declines a nicotine transdermal patch

## 2022-05-19 NOTE — Addendum Note (Signed)
Addended by: Valarie Cones on: 05/19/2022 04:02 PM   Modules accepted: Orders

## 2022-05-20 DIAGNOSIS — J111 Influenza due to unidentified influenza virus with other respiratory manifestations: Secondary | ICD-10-CM

## 2022-05-20 DIAGNOSIS — J189 Pneumonia, unspecified organism: Secondary | ICD-10-CM | POA: Diagnosis present

## 2022-05-20 LAB — URINE CYTOLOGY ANCILLARY ONLY
Chlamydia: NEGATIVE
Comment: NEGATIVE
Comment: NORMAL
Neisseria Gonorrhea: NEGATIVE

## 2022-05-20 LAB — RESP PANEL BY RT-PCR (FLU A&B, COVID) ARPGX2
Influenza A by PCR: POSITIVE — AB
Influenza B by PCR: NEGATIVE
SARS Coronavirus 2 by RT PCR: NEGATIVE

## 2022-05-20 LAB — PROTIME-INR
INR: 1.1 (ref 0.8–1.2)
Prothrombin Time: 14 seconds (ref 11.4–15.2)

## 2022-05-20 LAB — PROCALCITONIN: Procalcitonin: 0.62 ng/mL

## 2022-05-20 LAB — CORTISOL-AM, BLOOD: Cortisol - AM: 10.4 ug/dL (ref 6.7–22.6)

## 2022-05-20 LAB — GLUCOSE, CAPILLARY
Glucose-Capillary: 126 mg/dL — ABNORMAL HIGH (ref 70–99)
Glucose-Capillary: 90 mg/dL (ref 70–99)

## 2022-05-20 LAB — T-HELPER CELL (CD4) - (RCID CLINIC ONLY)
CD4 % Helper T Cell: 26 % — ABNORMAL LOW (ref 33–65)
CD4 T Cell Abs: 326 /uL — ABNORMAL LOW (ref 400–1790)

## 2022-05-20 LAB — CBG MONITORING, ED
Glucose-Capillary: 115 mg/dL — ABNORMAL HIGH (ref 70–99)
Glucose-Capillary: 132 mg/dL — ABNORMAL HIGH (ref 70–99)
Glucose-Capillary: 104 mg/dL — ABNORMAL HIGH (ref 70–99)

## 2022-05-20 LAB — LIPASE, BLOOD: Lipase: 32 U/L (ref 11–51)

## 2022-05-20 MED ORDER — IBUPROFEN 200 MG PO TABS
400.0000 mg | ORAL_TABLET | Freq: Four times a day (QID) | ORAL | Status: DC | PRN
  Administered 2022-05-21: 400 mg via ORAL
  Filled 2022-05-20: qty 2

## 2022-05-20 MED ORDER — SENNOSIDES-DOCUSATE SODIUM 8.6-50 MG PO TABS
1.0000 | ORAL_TABLET | Freq: Two times a day (BID) | ORAL | Status: DC
  Administered 2022-05-20 (×2): 1 via ORAL
  Filled 2022-05-20 (×3): qty 1

## 2022-05-20 MED ORDER — POLYETHYLENE GLYCOL 3350 17 G PO PACK
17.0000 g | PACK | Freq: Two times a day (BID) | ORAL | Status: DC
  Administered 2022-05-20: 17 g via ORAL
  Filled 2022-05-20 (×3): qty 1

## 2022-05-20 MED ORDER — OSELTAMIVIR PHOSPHATE 75 MG PO CAPS
75.0000 mg | ORAL_CAPSULE | Freq: Two times a day (BID) | ORAL | Status: DC
  Administered 2022-05-20 – 2022-05-21 (×3): 75 mg via ORAL
  Filled 2022-05-20 (×3): qty 1

## 2022-05-20 NOTE — Progress Notes (Signed)
PROGRESS NOTE    Raydin Bielinski  KXF:[id number] DOB: [birth year] DOA: 05/19/2022 PCP: Gardiner Barefoot, MD.)   No chief complaint on file.   Brief Narrative:   Farrell Pantaleo is a 31 y.o. male with medical history significant for bipolar disorder, HIV, nicotine dependence who was sent to the ER from the infectious disease clinic for evaluation of a cough productive of greenish phlegm, fever, chills and weakness. Patient had gone to the ID clinic with complaints of feeling unwell.  He reports having a cough productive of greenish phlegm associated with congestion and fever.  At the clinic he had a Tmax of 100.4 with a blood pressure of 97/62. Rapid flu AMB were done in the clinic and was negative. Patient was transported to the ER for further evaluation. Unable to do review of systems because he continues to moan in pain asking to be left alone that he wanted to rest. Vital signs in the ER showed a Tmax of 101.1, he was tachycardic with heart rate between 99 and 101 and respiratory rate of 34 Chest x-ray showed findings are concerning for multifocal pneumonia. He received a dose of IV Rocephin and Zithromax and will be admitted to the hospital for further evaluation.    Assessment & Plan:   Principal Problem:   Sepsis due to pneumonia [facility]) Active Problems:   HIV disease (HCC)   Tobacco use disorder   Bipolar I disorder (HCC)   Hypoglycemia   CAP (community acquired pneumonia)   Sepsis due to pneumonia, influenza pneumonia versus bacterial pneumonia (HCC) Sepsis present on admission as evidenced by fever with a Tmax of 101.1, tachycardia, tachypnea and chest x-ray findings suggestive of multifocal pneumonia Aggressive IV fluid resuscitation patient empirically on Rocephin and Zithromax Follow-up results of blood and sputum cultures Positive on admission, started on Tamiflu   Tobacco use disorder Smoking cessation was discussed with patient States that he has not smoked in 2  weeks because he has felt unwell Declines a nicotine transdermal patch   HIV disease (HCC) Noncompliant with HAART therapy Hold Biktarvy (D/W Dr. Luciana Axe his ID physician, patient has been noncompliant for years, NO initiation of HAART this admission)   Hypoglycemia Probably related to poor oral intake No known history of diabetes mellitus Place patient on dextrose containing fluids Check blood sugars every 4 hours and if stable will discontinue blood sugar checks   Bipolar I disorder (HCC) Not on any medications Will need referral to psychiatry as an outpatient       DVT prophylaxis: Lovenox Code Status: Full Family Communication: none at bedside Disposition:   Status is: Inpatient    Consultants:  None   Subjective:  He does not interact interact during my exam, does not answer questions  Objective: Vitals:   05/20/22 1230 05/20/22 1420 05/20/22 1459 05/20/22 1459  BP: 112/75 107/65 (!) 90/56 (!) 96/58  Pulse: 82 82 80 80  Resp:  16 18   Temp:  97.6 F (36.4 C) 97.9 F (36.6 C) 97.9 F (36.6 C)  TempSrc:  Axillary Oral Oral  SpO2: 98% 100% 98% 100%  Weight:      Height:        Intake/Output Summary (Last 24 hours) at 05/20/2022 1616 Last data filed at 05/20/2022 0343 Gross per 24 hour  Intake 999 ml  Output --  Net 999 ml   Filed Weights   05/19/22 1559  Weight: 68 kg    Examination:  Sleeping , eyes closed, no apparent distress Symmetrical  Chest wall movement, Good air movement bilaterally, CTAB RRR,No Gallops,Rubs or new Murmurs, No Parasternal Heave +ve B.Sounds, Abd Soft, No tenderness, No rebound - guarding or rigidity. No Cyanosis, Clubbing or edema, No new Rash or bruise   .     Data Reviewed: I have personally reviewed following labs and imaging studies  CBC: Recent Labs  Lab 05/19/22 1430 05/19/22 1621  WBC 6.8 8.1  NEUTROABS 4,651 5.7  HGB 13.1* 13.5  HCT 40.3 41.7  MCV 79.8* 79.7*  PLT 311 306    Basic Metabolic  Panel: Recent Labs  Lab 05/19/22 1430 05/19/22 1621  NA 132* 131*  K 3.9 3.7  CL 95* 98  CO2 28 25  GLUCOSE 88 98  BUN 13 12  CREATININE 1.07 1.24  CALCIUM 8.7 8.6*    GFR: Estimated Creatinine Clearance: 82.3 mL/min (by C-G formula based on SCr of 1.24 mg/dL).  Liver Function Tests: Recent Labs  Lab 05/19/22 1430  AST 86*  ALT 51*  BILITOT 0.4  PROT 8.7*    CBG: Recent Labs  Lab 05/19/22 1651 05/19/22 1833 05/20/22 0152 05/20/22 0453 05/20/22 1109  GLUCAP 45* 122* 115* 132* 104*     Recent Results (from the past 240 hour(s))  Culture, blood (routine x 2)     Status: None (Preliminary result)   Collection Time: 05/19/22  4:21 PM   Specimen: BLOOD  Result Value Ref Range Status   Specimen Description BLOOD SITE NOT SPECIFIED  Final   Special Requests   Final    BOTTLES DRAWN AEROBIC AND ANAEROBIC Blood Culture results may not be optimal due to an inadequate volume of blood received in culture bottles   Culture   Final    NO GROWTH < 24 HOURS Performed at [facility], [address] [place], [place] [address]    Report Status PENDING  Incomplete  Culture, blood (routine x 2)     Status: None (Preliminary result)   Collection Time: 05/19/22  8:27 PM   Specimen: BLOOD  Result Value Ref Range Status   Specimen Description BLOOD BLOOD RIGHT FOREARM  Final   Special Requests   Final    BOTTLES DRAWN AEROBIC AND ANAEROBIC Blood Culture adequate volume   Culture   Final    NO GROWTH < 12 HOURS Performed at [facility], [address] [place], [place] [address]    Report Status PENDING  Incomplete  Resp Panel by RT-PCR (Flu A&B, Covid)     Status: Abnormal   Collection Time: 05/20/22  1:53 AM  Result Value Ref Range Status   SARS Coronavirus 2 by RT PCR NEGATIVE NEGATIVE Final    Comment: (NOTE) SARS-CoV-2 target nucleic acids are NOT DETECTED.  The SARS-CoV-2 RNA is generally detectable in upper respiratory specimens during the acute  phase of infection. The lowest concentration of SARS-CoV-2 viral copies this assay can detect is 138 copies/mL. A negative result does not preclude SARS-Cov-2 infection and should not be used as the sole basis for treatment or other patient management decisions. A negative result may occur with  improper specimen collection/handling, submission of specimen other than nasopharyngeal swab, presence of viral mutation(s) within the areas targeted by this assay, and inadequate number of viral copies(<138 copies/mL). A negative result must be combined with clinical observations, patient history, and epidemiological information. The expected result is Negative.  Fact Sheet for Patients:  BloggerCourse.com  Fact Sheet for Healthcare Providers:  SeriousBroker.it  This test is no  t yet approved or cleared by the [place] and  has been authorized for detection and/or diagnosis of SARS-CoV-2 by FDA under an Emergency Use Authorization (EUA). This EUA will remain  in effect (meaning this test can be used) for the duration of the COVID-19 declaration under Section 564(b)(1) of the Act, 21 U.S.C.section 360bbb-3(b)(1), unless the authorization is terminated  or revoked sooner.       Influenza A by PCR POSITIVE (A) NEGATIVE Final   Influenza B by PCR NEGATIVE NEGATIVE Final    Comment: (NOTE) The Xpert Xpress SARS-CoV-2/FLU/RSV plus assay is intended as an aid in the diagnosis of influenza from Nasopharyngeal swab specimens and should not be used as a sole basis for treatment. Nasal washings and aspirates are unacceptable for Xpert Xpress SARS-CoV-2/FLU/RSV testing.  Fact Sheet for Patients: BloggerCourse.com  Fact Sheet for Healthcare Providers: SeriousBroker.it  This test is not yet approved or cleared by the [place] FDA and has been authorized for detection and/or diagnosis  of SARS-CoV-2 by FDA under an Emergency Use Authorization (EUA). This EUA will remain in effect (meaning this test can be used) for the duration of the COVID-19 declaration under Section 564(b)(1) of the Act, 21 U.S.C. section 360bbb-3(b)(1), unless the authorization is terminated or revoked.  Performed at [facility], [address] [place], [place] [address]          Radiology Studies: DG Chest 2 View  Result Date: 05/19/2022 CLINICAL DATA:  Cough and fever for 2 weeks.  Rule out pneumonia. EXAM: CHEST - 2 VIEW COMPARISON:  02/04/2022 FINDINGS: Airspace densities at the medial and posterior right lung base. Concern for subtle airspace densities at the left lung base. Upper lungs are clear. Heart size is normal. No large pleural effusions. No acute bone abnormality. IMPRESSION: Bibasilar lung densities, right side greater than left. Findings are concerning for multifocal pneumonia. Electronically Signed   By: Richarda Overlie M.D.   On: 05/19/2022 17:36        Scheduled Meds:  enoxaparin (LOVENOX) injection  40 mg Subcutaneous Q24H   oseltamivir  75 mg Oral BID   polyethylene glycol  17 g Oral BID   senna-docusate  1 tablet Oral BID   Continuous Infusions:  azithromycin     cefTRIAXone (ROCEPHIN)  IV     dextrose 5% lactated ringers 75 mL/hr at 05/20/22 1000     LOS: 1 day      Huey Bienenstock, MD [facility]   To contact the attending provider between 7A-7P or the covering provider during after hours 7P-7A, please log into the web site [email or web address] and access using universal [place] password for that web site. If you do not have the password, please call the hospital operator.  05/20/2022, 4:16 PM

## 2022-05-20 NOTE — Progress Notes (Signed)
Pt A&OX2-3. Non compliant with admission, c/o being cold, didn't really know why he was here. Pt c/o some stomach pain PRN zofran given with relief. 2/4 rails up, call bell nearby and bed alarm on. BP (!) 96/58   Pulse 80   Temp 97.9 F (36.6 C) (Oral)   Resp 18   Ht 5\' 9"  (1.753 m)   Wt 68 kg   SpO2 100%   BMI 22.14 kg/m  . 05/20/22 6:19 PM

## 2022-05-21 ENCOUNTER — Other Ambulatory Visit (HOSPITAL_COMMUNITY): Payer: Self-pay

## 2022-05-21 LAB — CBC
HCT: 35.6 % — ABNORMAL LOW (ref 39.0–52.0)
Hemoglobin: 11.9 g/dL — ABNORMAL LOW (ref 13.0–17.0)
MCH: 26.1 pg (ref 26.0–34.0)
MCHC: 33.4 g/dL (ref 30.0–36.0)
MCV: 78.1 fL — ABNORMAL LOW (ref 80.0–100.0)
Platelets: 284 10*3/uL (ref 150–400)
RBC: 4.56 MIL/uL (ref 4.22–5.81)
RDW: 15.3 % (ref 11.5–15.5)
WBC: 7.2 10*3/uL (ref 4.0–10.5)
nRBC: 0 % (ref 0.0–0.2)

## 2022-05-21 LAB — GLUCOSE, CAPILLARY
Glucose-Capillary: 103 mg/dL — ABNORMAL HIGH (ref 70–99)
Glucose-Capillary: 132 mg/dL — ABNORMAL HIGH (ref 70–99)
Glucose-Capillary: 133 mg/dL — ABNORMAL HIGH (ref 70–99)
Glucose-Capillary: 91 mg/dL (ref 70–99)

## 2022-05-21 LAB — BASIC METABOLIC PANEL
Anion gap: 7 (ref 5–15)
BUN: 9 mg/dL (ref 6–20)
CO2: 26 mmol/L (ref 22–32)
Calcium: 8.2 mg/dL — ABNORMAL LOW (ref 8.9–10.3)
Chloride: 101 mmol/L (ref 98–111)
Creatinine, Ser: 0.8 mg/dL (ref 0.61–1.24)
GFR, Estimated: >60 mL/min (ref >60)
Glucose, Bld: 124 mg/dL — ABNORMAL HIGH (ref 70–99)
Potassium: 3.6 mmol/L (ref 3.5–5.1)
Sodium: 134 mmol/L — ABNORMAL LOW (ref 135–145)

## 2022-05-21 MED ORDER — OSELTAMIVIR PHOSPHATE 75 MG PO CAPS
75.0000 mg | ORAL_CAPSULE | Freq: Two times a day (BID) | ORAL | 0 refills | Status: AC
  Filled 2022-05-21: qty 7, 4d supply, fill #0

## 2022-05-21 MED ORDER — AZITHROMYCIN 500 MG PO TABS
500.0000 mg | ORAL_TABLET | Freq: Every day | ORAL | 0 refills | Status: AC
  Filled 2022-05-21: qty 3, 3d supply, fill #0

## 2022-05-21 MED ORDER — CEFDINIR 300 MG PO CAPS
300.0000 mg | ORAL_CAPSULE | Freq: Two times a day (BID) | ORAL | 0 refills | Status: AC
  Filled 2022-05-21: qty 8, 4d supply, fill #0

## 2022-05-21 NOTE — Progress Notes (Signed)
Mobility Specialist Progress Note:   05/21/22 1017  Mobility  Activity Ambulated independently in room  Level of Assistance Independent  Assistive Device None  Distance Ambulated (ft) 40 ft  Activity Response Tolerated well  Mobility Referral Yes  $Mobility charge 1 Mobility   Pt received in room and agreeable. No complaints of pain or SOB. Pt deferred further mobility d/t upcoming discharge. Pt left in bed with all needs met and call bell in reach.   Andrey Campanile Mobility Specialist Please contact via SecureChat or  Rehab office at [phone]

## 2022-05-21 NOTE — Discharge Summary (Signed)
Physician Discharge Summary  Blake Chambers [id number] DOB: March 27, 1991 DOA: 05/19/2022  PCP: Thayer Headings, MD  Admit date: 05/19/2022 Discharge date: 05/21/2022  Admitted From: Home Disposition: Home  Recommendations for Outpatient Follow-up:  Follow up with PCP in 1-2 weeks Follow-up with infectious disease, Dr. Linus Salmons Continue antibiotics with azithromycin and cefdinir to complete course for community-acquired pneumonia Continue Tamiflu to complete course for positive influenza A infection  Home Health: No Equipment/Devices: None  Discharge Condition: Stable CODE STATUS: Full code Diet recommendation: Regular diet  History of present illness:  Blake Chambers is a 31 year old male with past medical history significant for bipolar disorder, HIV, nicotine dependence, medical noncompliance who presented to [facility] ED from the infectious disease clinic for evaluation of cough, productive of sputum, fever, chills and weakness.  Patient had a Tmax of 100.4 with a blood pressure of 97/62 at the ID clinic.  A rapid flu ambulatory screen was performed that was negative and patient was transported to the ED for further evaluation.  In the ED, Tmax 101.1, tachycardic with heart rate between 99-1 01 with respiratory rate of 34.  Chest x-ray findings concerning for multifocal pneumonia.  COVID-19 PCR negative.  Influenza A positive.  Patient received IV ceftriaxone and azithromycin.  TRH consulted for admission and further evaluation and management of commune acquired pneumonia, influenza A infection.  Hospital course:  Sepsis secondary to community-acquired pneumonia versus influenza A pneumonia Patient presenting to the ED from ID clinic with Tmax of 101.1, tachycardia, tachypnea and chest x-ray findings Suggestive of multifocal pneumonia.  Patient was given aggressive IV fluid resuscitation and started on empiric antibiotics.  Influenza A positive.  Patient was treated with IV ceftriaxone and  azithromycin as well as Tamiflu with improvement of symptoms.  Patient feels ready for discharge home at this time.  Will continue Tamiflu to complete 5-day course, will continue antibiotics with azithromycin and cefdinir to complete a 5-day course.  Outpatient follow-up with PCP/ID.  Tobacco use disorder Discussed with patient need for complete cessation.  HIV disease Patient has been noncompliant with HAART therapy outpatient.  Follows with ID, Dr. Linus Salmons; and discussed with him who recommended holding [place] as he has been noncompliant for years.  Outpatient follow-up with infectious disease.  Hypoglycemia: Resolved Etiology likely secondary to poor oral intake in the days preceding hospitalization.  Received IV fluids with improvement/resolution.  Bipolar 1 disorder Not on any medications outpatient.  Recommend outpatient referral to psychiatry/behavioral health.  Medical noncompliance Complicates his overall disease status with nonadherence to medical recommendations as well as home medications.  Discharge Diagnoses:  Principal Problem:   Sepsis due to pneumonia [facility]) Active Problems:   HIV disease ([place])   Tobacco use disorder   Bipolar I disorder ([place])   Hypoglycemia   CAP (community acquired pneumonia)    Discharge Instructions  Discharge Instructions     Call MD for:  difficulty breathing, headache or visual disturbances   Complete by: As directed    Call MD for:  extreme fatigue   Complete by: As directed    Call MD for:  persistant dizziness or light-headedness   Complete by: As directed    Call MD for:  persistant nausea and vomiting   Complete by: As directed    Call MD for:  severe uncontrolled pain   Complete by: As directed    Call MD for:  temperature >100.4   Complete by: As directed    Diet - low sodium heart healthy   Complete  by: As directed    Increase activity slowly   Complete by: As directed       Allergies as of 05/21/2022       Reactions    Tenofovir Disoproxil Other (See Comments)   Intolerance, increase in creatinine   (TRUVADA)    Kiwi Extract Swelling        Medication List     STOP taking these medications    amoxicillin-clavulanate 875-125 MG tablet Commonly known as: AUGMENTIN   cetirizine 10 MG tablet Commonly known as: ZyrTEC Allergy   methocarbamol 500 MG tablet Commonly known as: ROBAXIN       TAKE these medications    acetaminophen 650 MG CR tablet Commonly known as: Tylenol 8 Hour Take 1 tablet (650 mg total) by mouth every 8 (eight) hours as needed for pain.   azithromycin 500 MG tablet Commonly known as: Zithromax Take 1 tablet (500 mg total) by mouth daily for 3 days. Take 1 tablet daily for 3 days.   bictegravir-emtricitabine-tenofovir AF 50-200-25 MG Tabs tablet Commonly known as: BIKTARVY Take 1 tablet by mouth daily.   cefdinir 300 MG capsule Commonly known as: OMNICEF Take 1 capsule (300 mg total) by mouth 2 (two) times daily for 8 doses.   ibuprofen 600 MG tablet Commonly known as: ADVIL Take 1 tablet (600 mg total) by mouth every 6 (six) hours as needed. What changed:  how much to take reasons to take this   oseltamivir 75 MG capsule Commonly known as: Tamiflu Take 1 capsule (75 mg total) by mouth 2 (two) times daily for 7 doses.        Follow-up Information     Comer, Okey Regal, MD. Schedule an appointment as soon as possible for a visit in 1 week(s).   Specialty: Infectious Diseases Contact information: [address] [place] [address] [phone]                Allergies  Allergen Reactions   Tenofovir Disoproxil Other (See Comments)    Intolerance, increase in creatinine   (TRUVADA)    Kiwi Extract Swelling    Consultations: None   Procedures/Studies: DG Chest 2 View  Result Date: 05/19/2022 CLINICAL DATA:  Cough and fever for 2 weeks.  Rule out pneumonia. EXAM: CHEST - 2 VIEW COMPARISON:  02/04/2022 FINDINGS: Airspace  densities at the medial and posterior right lung base. Concern for subtle airspace densities at the left lung base. Upper lungs are clear. Heart size is normal. No large pleural effusions. No acute bone abnormality. IMPRESSION: Bibasilar lung densities, right side greater than left. Findings are concerning for multifocal pneumonia. Electronically Signed   By: Markus Daft M.D.   On: 05/19/2022 17:36     Subjective:   Discharge Exam: Vitals:   05/21/22 0507 05/21/22 0808  BP: 102/73 115/88  Pulse: 62 77  Resp: 17 18  Temp: 97.7 F (36.5 C) 98 F (36.7 C)  SpO2: 100% 100%   Vitals:   05/20/22 1459 05/20/22 2006 05/21/22 0507 05/21/22 0808  BP: (!) 96/58 109/65 102/73 115/88  Pulse: 80 78 62 77  Resp:  18 17 18   Temp: 97.9 F (36.6 C) 98 F (36.7 C) 97.7 F (36.5 C) 98 F (36.7 C)  TempSrc: Oral Oral Oral   SpO2: 100% 99% 100% 100%  Weight:      Height:        Physical Exam: GEN: NAD, alert and oriented x 3, chronically ill appearance, appears older  than stated age HEENT: NCAT, PERRL, EOMI, sclera clear, poor dentition PULM: CTAB w/o wheezes/crackles, normal respiratory effort, on room air CV: RRR w/o M/G/R GI: abd soft, NTND, NABS, no R/G/M MSK: no peripheral edema, moves all extremities independently NEURO: No focal deficits PSYCH: normal mood/affect Integumentary: dry/intact, no rashes or wounds    The results of significant diagnostics from this hospitalization (including imaging, microbiology, ancillary and laboratory) are listed below for reference.     Microbiology: Recent Results (from the past 240 hour(s))  Culture, blood (routine x 2)     Status: None (Preliminary result)   Collection Time: 05/19/22  4:21 PM   Specimen: BLOOD  Result Value Ref Range Status   Specimen Description BLOOD SITE NOT SPECIFIED  Final   Special Requests   Final    BOTTLES DRAWN AEROBIC AND ANAEROBIC Blood Culture results may not be optimal due to an inadequate volume of blood  received in culture bottles   Culture   Final    NO GROWTH 2 DAYS Performed at [facility], [address], [place], [place] [address]    Report Status PENDING  Incomplete  Culture, blood (routine x 2)     Status: None (Preliminary result)   Collection Time: 05/19/22  8:27 PM   Specimen: BLOOD  Result Value Ref Range Status   Specimen Description BLOOD BLOOD RIGHT FOREARM  Final   Special Requests   Final    BOTTLES DRAWN AEROBIC AND ANAEROBIC Blood Culture adequate volume   Culture   Final    NO GROWTH 2 DAYS Performed at [facility], [address], [place], [place] [address]    Report Status PENDING  Incomplete  Resp Panel by RT-PCR (Flu A&B, Covid)     Status: Abnormal   Collection Time: 05/20/22  1:53 AM  Result Value Ref Range Status   SARS Coronavirus 2 by RT PCR NEGATIVE NEGATIVE Final    Comment: (NOTE) SARS-CoV-2 target nucleic acids are NOT DETECTED.  The SARS-CoV-2 RNA is generally detectable in upper respiratory specimens during the acute phase of infection. The lowest concentration of SARS-CoV-2 viral copies this assay can detect is 138 copies/mL. A negative result does not preclude SARS-Cov-2 infection and should not be used as the sole basis for treatment or other patient management decisions. A negative result may occur with  improper specimen collection/handling, submission of specimen other than nasopharyngeal swab, presence of viral mutation(s) within the areas targeted by this assay, and inadequate number of viral copies(<138 copies/mL). A negative result must be combined with clinical observations, patient history, and epidemiological information. The expected result is Negative.  Fact Sheet for Patients:  BloggerCourse.com  Fact Sheet for Healthcare Providers:  SeriousBroker.it  This test is no t yet approved or cleared by the [place] FDA and  has been authorized for detection  and/or diagnosis of SARS-CoV-2 by FDA under an Emergency Use Authorization (EUA). This EUA will remain  in effect (meaning this test can be used) for the duration of the COVID-19 declaration under Section 564(b)(1) of the Act, 21 U.S.C.section 360bbb-3(b)(1), unless the authorization is terminated  or revoked sooner.       Influenza A by PCR POSITIVE (A) NEGATIVE Final   Influenza B by PCR NEGATIVE NEGATIVE Final    Comment: (NOTE) The Xpert Xpress SARS-CoV-2/FLU/RSV plus assay is intended as an aid in the diagnosis of influenza from Nasopharyngeal swab specimens and should not be used as a sole basis for treatment. Nasal  washings and aspirates are unacceptable for Xpert Xpress SARS-CoV-2/FLU/RSV testing.  Fact Sheet for Patients: EntrepreneurPulse.com.au  Fact Sheet for Healthcare Providers: IncredibleEmployment.be  This test is not yet approved or cleared by the [place] FDA and has been authorized for detection and/or diagnosis of SARS-CoV-2 by FDA under an Emergency Use Authorization (EUA). This EUA will remain in effect (meaning this test can be used) for the duration of the COVID-19 declaration under Section 564(b)(1) of the Act, 21 U.S.C. section 360bbb-3(b)(1), unless the authorization is terminated or revoked.  Performed at [facility], [place] [address], [place], [place] [address]      Labs: BNP (last 3 results) No results for input(s): "BNP" in the last 8760 hours. Basic Metabolic Panel: Recent Labs  Lab 05/19/22 1430 05/19/22 1621 05/21/22 0658  NA 132* 131* 134*  K 3.9 3.7 3.6  CL 95* 98 101  CO2 28 25 26   GLUCOSE 88 98 124*  BUN 13 12 9   CREATININE 1.07 1.24 0.80  CALCIUM 8.7 8.6* 8.2*   Liver Function Tests: Recent Labs  Lab 05/19/22 1430  AST 86*  ALT 51*  BILITOT 0.4  PROT 8.7*   Recent Labs  Lab 05/20/22 0530  LIPASE 32   No results for input(s): "AMMONIA" in the last 168  hours. CBC: Recent Labs  Lab 05/19/22 1430 05/19/22 1621 05/21/22 0658  WBC 6.8 8.1 7.2  NEUTROABS 4,651 5.7  --   HGB 13.1* 13.5 11.9*  HCT 40.3 41.7 35.6*  MCV 79.8* 79.7* 78.1*  PLT 311 306 284   Cardiac Enzymes: No results for input(s): "CKTOTAL", "CKMB", "CKMBINDEX", "TROPONINI" in the last 168 hours. BNP: Invalid input(s): "POCBNP" CBG: Recent Labs  Lab 05/20/22 1618 05/20/22 2052 05/21/22 0007 05/21/22 0538 05/21/22 0848  GLUCAP 126* 90 133* 132* 91   D-Dimer No results for input(s): "DDIMER" in the last 72 hours. Hgb A1c No results for input(s): "HGBA1C" in the last 72 hours. Lipid Profile No results for input(s): "CHOL", "HDL", "LDLCALC", "TRIG", "CHOLHDL", "LDLDIRECT" in the last 72 hours. Thyroid function studies No results for input(s): "TSH", "T4TOTAL", "T3FREE", "THYROIDAB" in the last 72 hours.  Invalid input(s): "FREET3" Anemia work up No results for input(s): "VITAMINB12", "FOLATE", "FERRITIN", "TIBC", "IRON", "RETICCTPCT" in the last 72 hours. Urinalysis    Component Value Date/Time   COLORURINE AMBER (A) 04/15/2022 0222   APPEARANCEUR CLEAR 04/15/2022 0222   LABSPEC 1.030 04/15/2022 0222   PHURINE 5.0 04/15/2022 0222   GLUCOSEU NEGATIVE 04/15/2022 0222   HGBUR NEGATIVE 04/15/2022 0222   BILIRUBINUR NEGATIVE 04/15/2022 0222   KETONESUR NEGATIVE 04/15/2022 0222   PROTEINUR 30 (A) 04/15/2022 0222   UROBILINOGEN 1.0 07/31/2014 2316   NITRITE NEGATIVE 04/15/2022 0222   LEUKOCYTESUR NEGATIVE 04/15/2022 0222   Sepsis Labs Recent Labs  Lab 05/19/22 1430 05/19/22 1621 05/21/22 0658  WBC 6.8 8.1 7.2   Microbiology Recent Results (from the past 240 hour(s))  Culture, blood (routine x 2)     Status: None (Preliminary result)   Collection Time: 05/19/22  4:21 PM   Specimen: BLOOD  Result Value Ref Range Status   Specimen Description BLOOD SITE NOT SPECIFIED  Final   Special Requests   Final    BOTTLES DRAWN AEROBIC AND ANAEROBIC Blood  Culture results may not be optimal due to an inadequate volume of blood received in culture bottles   Culture   Final    NO GROWTH 2 DAYS Performed at [facility], [place] [address],  [place], [place] [address]    Report Status PENDING  Incomplete  Culture, blood (routine x 2)     Status: None (Preliminary result)   Collection Time: 05/19/22  8:27 PM   Specimen: BLOOD  Result Value Ref Range Status   Specimen Description BLOOD BLOOD RIGHT FOREARM  Final   Special Requests   Final    BOTTLES DRAWN AEROBIC AND ANAEROBIC Blood Culture adequate volume   Culture   Final    NO GROWTH 2 DAYS Performed at [facility], [address], [place], [place] [address]    Report Status PENDING  Incomplete  Resp Panel by RT-PCR (Flu A&B, Covid)     Status: Abnormal   Collection Time: 05/20/22  1:53 AM  Result Value Ref Range Status   SARS Coronavirus 2 by RT PCR NEGATIVE NEGATIVE Final    Comment: (NOTE) SARS-CoV-2 target nucleic acids are NOT DETECTED.  The SARS-CoV-2 RNA is generally detectable in upper respiratory specimens during the acute phase of infection. The lowest concentration of SARS-CoV-2 viral copies this assay can detect is 138 copies/mL. A negative result does not preclude SARS-Cov-2 infection and should not be used as the sole basis for treatment or other patient management decisions. A negative result may occur with  improper specimen collection/handling, submission of specimen other than nasopharyngeal swab, presence of viral mutation(s) within the areas targeted by this assay, and inadequate number of viral copies(<138 copies/mL). A negative result must be combined with clinical observations, patient history, and epidemiological information. The expected result is Negative.  Fact Sheet for Patients:  EntrepreneurPulse.com.au  Fact Sheet for Healthcare Providers:  IncredibleEmployment.be  This test is no t yet approved or  cleared by the [place] FDA and  has been authorized for detection and/or diagnosis of SARS-CoV-2 by FDA under an Emergency Use Authorization (EUA). This EUA will remain  in effect (meaning this test can be used) for the duration of the COVID-19 declaration under Section 564(b)(1) of the Act, 21 U.S.C.section 360bbb-3(b)(1), unless the authorization is terminated  or revoked sooner.       Influenza A by PCR POSITIVE (A) NEGATIVE Final   Influenza B by PCR NEGATIVE NEGATIVE Final    Comment: (NOTE) The Xpert Xpress SARS-CoV-2/FLU/RSV plus assay is intended as an aid in the diagnosis of influenza from Nasopharyngeal swab specimens and should not be used as a sole basis for treatment. Nasal washings and aspirates are unacceptable for Xpert Xpress SARS-CoV-2/FLU/RSV testing.  Fact Sheet for Patients: EntrepreneurPulse.com.au  Fact Sheet for Healthcare Providers: IncredibleEmployment.be  This test is not yet approved or cleared by the [place] FDA and has been authorized for detection and/or diagnosis of SARS-CoV-2 by FDA under an Emergency Use Authorization (EUA). This EUA will remain in effect (meaning this test can be used) for the duration of the COVID-19 declaration under Section 564(b)(1) of the Act, 21 U.S.C. section 360bbb-3(b)(1), unless the authorization is terminated or revoked.  Performed at [facility], [place] [address], [place], [place] [address]      Time coordinating discharge: Over 30 minutes  SIGNED:   Hayly Litsey J [place] DO  [facility] 05/21/2022, 10:06 AM

## 2022-05-22 LAB — COMPLETE METABOLIC PANEL WITH GFR
AG Ratio: 0.6 (calc) — ABNORMAL LOW (ref 1.0–2.5)
ALT: 51 U/L — ABNORMAL HIGH (ref 9–46)
AST: 86 U/L — ABNORMAL HIGH (ref 10–40)
Albumin: 3.4 g/dL — ABNORMAL LOW (ref 3.6–5.1)
Alkaline phosphatase (APISO): 58 U/L (ref 36–130)
BUN: 13 mg/dL (ref 7–25)
CO2: 28 mmol/L (ref 20–32)
Calcium: 8.7 mg/dL (ref 8.6–10.3)
Chloride: 95 mmol/L — ABNORMAL LOW (ref 98–110)
Creat: 1.07 mg/dL (ref 0.60–1.26)
Globulin: 5.3 g/dL (calc) — ABNORMAL HIGH (ref 1.9–3.7)
Glucose, Bld: 88 mg/dL (ref 65–99)
Potassium: 3.9 mmol/L (ref 3.5–5.3)
Sodium: 132 mmol/L — ABNORMAL LOW (ref 135–146)
Total Bilirubin: 0.4 mg/dL (ref 0.2–1.2)
Total Protein: 8.7 g/dL — ABNORMAL HIGH (ref 6.1–8.1)
eGFR: 95 mL/min/{1.73_m2} (ref >=60)

## 2022-05-22 LAB — CBC WITH DIFFERENTIAL/PLATELET
Absolute Monocytes: 809 cells/uL (ref 200–950)
Basophils Absolute: 27 cells/uL (ref 0–200)
Basophils Relative: 0.4 %
Eosinophils Absolute: 7 cells/uL — ABNORMAL LOW (ref 15–500)
Eosinophils Relative: 0.1 %
HCT: 40.3 % (ref 38.5–50.0)
Hemoglobin: 13.1 g/dL — ABNORMAL LOW (ref 13.2–17.1)
Lymphs Abs: 1306 cells/uL (ref 850–3900)
MCH: 25.9 pg — ABNORMAL LOW (ref 27.0–33.0)
MCHC: 32.5 g/dL (ref 32.0–36.0)
MCV: 79.8 fL — ABNORMAL LOW (ref 80.0–100.0)
MPV: 10.7 fL (ref 7.5–12.5)
Monocytes Relative: 11.9 %
Neutro Abs: 4651 cells/uL (ref 1500–7800)
Neutrophils Relative %: 68.4 %
Platelets: 311 10*3/uL (ref 140–400)
RBC: 5.05 10*6/uL (ref 4.20–5.80)
RDW: 14.7 % (ref 11.0–15.0)
Total Lymphocyte: 19.2 %
WBC: 6.8 10*3/uL (ref 3.8–10.8)

## 2022-05-22 LAB — RPR TITER: RPR Titer: 1:4 {titer} — ABNORMAL HIGH

## 2022-05-22 LAB — FLUORESCENT TREPONEMAL AB(FTA)-IGG-BLD: Fluorescent Treponemal ABS: REACTIVE — AB

## 2022-05-22 LAB — HIV-1 RNA QUANT-NO REFLEX-BLD
HIV 1 RNA Quant: 1920 Copies/mL — ABNORMAL HIGH
HIV-1 RNA Quant, Log: 3.28 Log cps/mL — ABNORMAL HIGH

## 2022-05-22 LAB — RPR: RPR Ser Ql: REACTIVE — AB

## 2022-05-23 ENCOUNTER — Other Ambulatory Visit (HOSPITAL_COMMUNITY): Payer: Self-pay

## 2022-05-24 LAB — CULTURE, BLOOD (ROUTINE X 2)
Culture: NO GROWTH
Culture: NO GROWTH
Special Requests: ADEQUATE

## 2022-06-02 ENCOUNTER — Ambulatory Visit: Payer: Medicaid Other | Admitting: Infectious Diseases

## 2022-06-22 ENCOUNTER — Emergency Department (HOSPITAL_COMMUNITY)
Admission: EM | Admit: 2022-06-22 | Discharge: 2022-06-23 | Disposition: A | Discharge status: Home / Self Care | Payer: No Typology Code available for payment source | Attending: Emergency Medicine | Admitting: Emergency Medicine

## 2022-06-22 ENCOUNTER — Other Ambulatory Visit: Payer: Self-pay

## 2022-06-22 ENCOUNTER — Encounter (HOSPITAL_COMMUNITY): Payer: Self-pay

## 2022-06-22 DIAGNOSIS — Z21 Asymptomatic human immunodeficiency virus [HIV] infection status: Secondary | ICD-10-CM | POA: Insufficient documentation

## 2022-06-22 DIAGNOSIS — R55 Syncope and collapse: Secondary | ICD-10-CM | POA: Insufficient documentation

## 2022-06-22 DIAGNOSIS — F17210 Nicotine dependence, cigarettes, uncomplicated: Secondary | ICD-10-CM | POA: Insufficient documentation

## 2022-06-22 DIAGNOSIS — E860 Dehydration: Secondary | ICD-10-CM | POA: Diagnosis not present

## 2022-06-22 LAB — CBC WITH DIFFERENTIAL/PLATELET
Abs Immature Granulocytes: 0.01 10*3/uL (ref 0.00–0.07)
Basophils Absolute: 0 10*3/uL (ref 0.0–0.1)
Basophils Relative: 0 %
Eosinophils Absolute: 0.1 10*3/uL (ref 0.0–0.5)
Eosinophils Relative: 1 %
HCT: 42.4 % (ref 39.0–52.0)
Hemoglobin: 13.5 g/dL (ref 13.0–17.0)
Immature Granulocytes: 0 %
Lymphocytes Relative: 42 %
Lymphs Abs: 1.9 10*3/uL (ref 0.7–4.0)
MCH: 26 pg (ref 26.0–34.0)
MCHC: 31.8 g/dL (ref 30.0–36.0)
MCV: 81.5 fL (ref 80.0–100.0)
Monocytes Absolute: 0.4 10*3/uL (ref 0.1–1.0)
Monocytes Relative: 10 %
Neutro Abs: 2.1 10*3/uL (ref 1.7–7.7)
Neutrophils Relative %: 47 %
Platelets: 239 10*3/uL (ref 150–400)
RBC: 5.2 MIL/uL (ref 4.22–5.81)
RDW: 16.1 % — ABNORMAL HIGH (ref 11.5–15.5)
WBC: 4.5 10*3/uL (ref 4.0–10.5)
nRBC: 0 % (ref 0.0–0.2)

## 2022-06-22 NOTE — ED Provider Notes (Incomplete)
[facility] DEPT Provider Note: Blake Spurling, MD, FACEP  CSN: [id number] MRN: [id number] ARRIVAL: 06/22/22 at 38 ROOM: [place]  Syncope   HISTORY OF PRESENT ILLNESS  06/22/22 11:56 PM Blake Chambers is a 32 y.o. male with HIV not currently on antiretroviral therapy.  He was walking down the street earlier today and awakened on the side of the road.  He does not remember falling and presumes he passed out.  He did not injure himself.  When EMS arrived they found to be awake, alert and oriented.  He states he had been walking a long way and had not been drinking much fluid earlier.   Past Medical History:  Diagnosis Date  . Anxiety   . Bipolar affective ([place])   . HIV (human immunodeficiency virus infection) ([place])   . Immune deficiency disorder ([place])   . Mental health disorder   . 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 Syphilis     Past Surgical History:  Procedure Laterality Date  . IRRIGATION AND DEBRIDEMENT ABSCESS N/A 08/20/2017   Procedure: IRRIGATION AND DEBRIDEMENT PERI RECTAL ABSCESS;  Surgeon: Ileana Roup, MD;  Location: [facility];  Service: General;  Laterality: N/A;    Family History  Family history unknown: Yes    Social History   Tobacco Use  . Smoking status: Some Days    Packs/day: 0.30    Types: Cigarettes  . Smokeless tobacco: Never  . Tobacco comments:    pt. trying to quit  Vaping Use  . Vaping Use: Never used  Substance Use Topics  . Alcohol use: Not Currently    Alcohol/week: 1.0 standard drink of alcohol    Types: 1 Standard drinks or equivalent per week  . Drug use: Not Currently    Frequency: 1.0 times per week    Types: Marijuana, Cocaine, Methamphetamines    Prior to Admission medications   Medication Sig Start Date End Date Taking? Authorizing Provider  acetaminophen (TYLENOL 8 HOUR) 650 MG CR tablet Take 1 tablet (650 mg total) by mouth every 8 (eight) hours as needed for pain. 04/18/22   Suzy Bouchard, PA-C  bictegravir-emtricitabine-tenofovir AF (BIKTARVY) 50-200-25 MG TABS tablet Take 1 tablet by mouth daily. Patient not taking: Reported on 05/20/2022 12/21/21   Garald Balding, PA-C  ibuprofen (ADVIL) 600 MG tablet Take 1 tablet (600 mg total) by mouth every 6 (six) hours as needed. Patient taking differently: Take 200-600 mg by mouth every 6 (six) hours as needed for moderate pain. 06/30/21   Blanchie Dessert, MD    Allergies Tenofovir disoproxil and Kiwi extract   REVIEW OF SYSTEMS  Negative except as noted here or in the History of Present Illness.   PHYSICAL EXAMINATION  Initial Vital Signs Blood pressure 127/85, pulse 82, temperature 98.1 F (36.7 C), resp. rate 18, SpO2 94 %.  Examination General: Well-developed, well-nourished male in no acute distress; appearance consistent with age of record HENT: normocephalic; atraumatic Eyes: pupils equal, round and reactive to light; extraocular muscles intact Neck: supple Heart: regular rate and rhythm; no murmurs, rubs or gallops Lungs: clear to auscultation bilaterally Abdomen: soft; nondistended; nontender; no masses or hepatosplenomegaly; bowel sounds present Extremities: No deformity; full range of motion; pulses normal Neurologic: Awake, alert and oriented; motor function intact in all extremities and symmetric; no facial droop Skin: Warm and dry Psychiatric: Normal mood and affect   RESULTS  Summary of this visit's results, reviewed and interpreted by  myself:   EKG Interpretation  Date/Time:    Ventricular Rate:    PR Interval:    QRS Duration:   QT Interval:    QTC Calculation:   R Axis:     Text Interpretation:         Laboratory Studies: Results for orders placed or performed during the hospital encounter of 06/22/22 (from the past 24 hour(s))  Basic metabolic panel     Status: Abnormal   Collection Time: 06/22/22 11:42 PM  Result Value Ref Range   Sodium 133 (L) 135 - 145 mmol/L    Potassium 3.8 3.5 - 5.1 mmol/L   Chloride 104 98 - 111 mmol/L   CO2 23 22 - 32 mmol/L   Glucose, Bld 87 70 - 99 mg/dL   BUN 11 6 - 20 mg/dL   Creatinine, Ser 7.78 0.61 - 1.24 mg/dL   Calcium 8.3 (L) 8.9 - 10.3 mg/dL   GFR, Estimated >24 >23 mL/min   Anion gap 6 5 - 15  CBC WITH DIFFERENTIAL     Status: Abnormal   Collection Time: 06/22/22 11:42 PM  Result Value Ref Range   WBC 4.5 4.0 - 10.5 K/uL   RBC 5.20 4.22 - 5.81 MIL/uL   Hemoglobin 13.5 13.0 - 17.0 g/dL   HCT 53.6 14.4 - 31.5 %   MCV 81.5 80.0 - 100.0 fL   MCH 26.0 26.0 - 34.0 pg   MCHC 31.8 30.0 - 36.0 g/dL   RDW 40.0 (H) 86.7 - 61.9 %   Platelets 239 150 - 400 K/uL   nRBC 0.0 0.0 - 0.2 %   Neutrophils Relative % 47 %   Neutro Abs 2.1 1.7 - 7.7 K/uL   Lymphocytes Relative 42 %   Lymphs Abs 1.9 0.7 - 4.0 K/uL   Monocytes Relative 10 %   Monocytes Absolute 0.4 0.1 - 1.0 K/uL   Eosinophils Relative 1 %   Eosinophils Absolute 0.1 0.0 - 0.5 K/uL   Basophils Relative 0 %   Basophils Absolute 0.0 0.0 - 0.1 K/uL   Immature Granulocytes 0 %   Abs Immature Granulocytes 0.01 0.00 - 0.07 K/uL   Imaging Studies: No results found.  ED COURSE and MDM  Nursing notes, initial and subsequent vitals signs, including pulse oximetry, reviewed and interpreted by myself.  Vitals:   06/22/22 1746 06/22/22 2230 06/22/22 2345  BP: (!) 132/91 127/85 127/84  Pulse: (!) 103 82 80  Resp: 18 18 18   Temp: 98.4 F (36.9 C) 98.1 F (36.7 C)   TempSrc: Oral    SpO2: 99% 94% 98%   Medications  sodium chloride 0.9 % bolus 1,000 mL (has no administration in time range)      PROCEDURES  Procedures   ED DIAGNOSES  No diagnosis found.

## 2022-06-22 NOTE — ED Provider Notes (Addendum)
[facility]-EMERGENCY DEPT Provider Note: [name], MD, FACEP  CSN: [id number] MRN: [id number] ARRIVAL: 06/22/22 at 1739 ROOM: WA08/WA08   CHIEF COMPLAINT  Syncope   HISTORY OF PRESENT ILLNESS  06/22/22 11:56 PM Blake Chambers is a 32 y.o. male with HIV not currently on antiretroviral therapy, and substance abuse.  He was walking down the street earlier today and awakened on the side of the road.  He does not remember falling and presumes he passed out.  He did not injure himself.  When EMS arrived they found to be awake, alert and oriented.  He states he had been walking a long way and had not been drinking much fluid earlier.   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 trying to quit  Vaping Use   Vaping Use: Never used  Substance Use Topics   Alcohol use: Not Currently    Alcohol/week: 1.0 standard drink of alcohol    Types: 1 Standard drinks or equivalent per week   Drug use: Not Currently    Frequency: 1.0 times per week    Types: Marijuana, Cocaine, Methamphetamines    Prior to Admission medications   Medication Sig Start Date End Date Taking? Authorizing Provider  acetaminophen (TYLENOL 8 HOUR) 650 MG CR tablet Take 1 tablet (650 mg total) by mouth every 8 (eight) hours as needed for pain. 04/18/22    Mannie StabileAberman, Caroline C, PA-C  bictegravir-emtricitabine-tenofovir AF (BIKTARVY) 50-200-25 MG TABS tablet Take 1 tablet by mouth daily. Patient not taking: Reported on 05/20/2022 12/21/21   Mare FerrariBelaya, Maria A, PA-C  ibuprofen (ADVIL) 600 MG tablet Take 1 tablet (600 mg total) by mouth every 6 (six) hours as needed. Patient taking differently: Take 200-600 mg by mouth every 6 (six) hours as needed for moderate pain. 06/30/21   Gwyneth SproutPlunkett, Whitney, MD    Allergies Tenofovir disoproxil and Kiwi extract   REVIEW OF SYSTEMS  Negative except as noted here or in the History of Present Illness.   PHYSICAL EXAMINATION  Initial Vital Signs Blood pressure 127/85, pulse 82, temperature 98.1 F (36.7 C), resp. rate 18, SpO2 94 %.  Examination General: Well-developed, well-nourished male in no acute distress; appearance consistent with age of record HENT: normocephalic; atraumatic; poor dentition Eyes: Normal appearance Neck: supple Heart: regular rate and rhythm Lungs: clear to auscultation bilaterally Abdomen: soft; nondistended; nontender; bowel sounds present Extremities: Chronic appearing deformity of distal left index finger; full range of motion; pulses normal Neurologic: Awake, alert and oriented; motor function intact in all extremities and symmetric; no facial droop Skin: Warm and dry Psychiatric: Flat affect   RESULTS  Summary of this visit's results, reviewed and interpreted by myself:   EKG Interpretation  Date/Time:  Sunday June 22 2022 18:08:41 EST Ventricular Rate:  100 PR Interval:  129 QRS Duration: 78 QT Interval:  325 QTC Calculation: 420 R Axis:   56 Text Interpretation: Sinus tachycardia Left ventricular hypertrophy Confirmed by Jermale Crass [phone]) on 06/23/2022 12:05:54 AM       Laboratory Studies: Results for orders placed or performed during the hospital encounter of 06/22/22 (from the past 24 hour(s))  Basic metabolic panel     Status: Abnormal   Collection  Time: 06/22/22 11:42 PM  Result Value Ref Range   Sodium 133 (L) 135 - 145 mmol/L   Potassium 3.8 3.5 - 5.1 mmol/L   Chloride 104 98 - 111 mmol/L   CO2 23 22 - 32 mmol/L   Glucose, Bld 87 70 - 99 mg/dL   BUN 11 6 - 20 mg/dL   Creatinine, Ser 1.02 0.61 - 1.24 mg/dL   Calcium 8.3 (L) 8.9 - 10.3 mg/dL   GFR, Estimated >60 >60 mL/min   Anion gap 6 5 - 15  CBC WITH DIFFERENTIAL     Status: Abnormal   Collection Time: 06/22/22 11:42 PM  Result Value Ref Range   WBC 4.5 4.0 - 10.5 K/uL   RBC 5.20 4.22 - 5.81 MIL/uL   Hemoglobin 13.5 13.0 - 17.0 g/dL   HCT 42.4 39.0 - 52.0 %   MCV 81.5 80.0 - 100.0 fL   MCH 26.0 26.0 - 34.0 pg   MCHC 31.8 30.0 - 36.0 g/dL   RDW 16.1 (H) 11.5 - 15.5 %   Platelets 239 150 - 400 K/uL   nRBC 0.0 0.0 - 0.2 %   Neutrophils Relative % 47 %   Neutro Abs 2.1 1.7 - 7.7 K/uL   Lymphocytes Relative 42 %   Lymphs Abs 1.9 0.7 - 4.0 K/uL   Monocytes Relative 10 %   Monocytes Absolute 0.4 0.1 - 1.0 K/uL   Eosinophils Relative 1 %   Eosinophils Absolute 0.1 0.0 - 0.5 K/uL   Basophils Relative 0 %   Basophils Absolute 0.0 0.0 - 0.1 K/uL   Immature Granulocytes 0 %   Abs Immature Granulocytes 0.01 0.00 - 0.07 K/uL  Urinalysis, Routine w reflex microscopic     Status: Abnormal   Collection Time: 06/23/22  1:58 AM  Result Value Ref Range   Color, Urine AMBER (A) YELLOW   APPearance CLEAR CLEAR   Specific Gravity, Urine 1.030 1.005 - 1.030   pH 5.0 5.0 - 8.0   Glucose, UA NEGATIVE NEGATIVE mg/dL   Hgb urine dipstick NEGATIVE NEGATIVE   Bilirubin Urine NEGATIVE NEGATIVE   Ketones, ur NEGATIVE NEGATIVE mg/dL   Protein, ur 30 (A) NEGATIVE mg/dL   Nitrite NEGATIVE NEGATIVE   Leukocytes,Ua NEGATIVE NEGATIVE   RBC / HPF 0-5 0 - 5 RBC/hpf   WBC, UA 0-5 0 - 5 WBC/hpf   Bacteria, UA NONE SEEN NONE SEEN   Squamous Epithelial / HPF 0-5 0 - 5 /HPF   Mucus PRESENT   Rapid urine drug screen (hospital performed)     Status: Abnormal   Collection Time: 06/23/22  1:58 AM   Result Value Ref Range   Opiates NONE DETECTED NONE DETECTED   Cocaine NONE DETECTED NONE DETECTED   Benzodiazepines NONE DETECTED NONE DETECTED   Amphetamines POSITIVE (A) NONE DETECTED   Tetrahydrocannabinol NONE DETECTED NONE DETECTED   Barbiturates NONE DETECTED NONE DETECTED   Imaging Studies: No results found.  ED COURSE and MDM  Nursing notes, initial and subsequent vitals  signs, including pulse oximetry, reviewed and interpreted by myself.  Vitals:   06/22/22 2230 06/22/22 2345 06/23/22 0045 06/23/22 0145  BP: 127/85 127/84 110/65 109/78  Pulse: 82 80 92 78  Resp: 18 18 17 17   Temp: 98.1 F (36.7 C)     TempSrc:      SpO2: 94% 98% 96% 96%   Medications  sodium chloride 0.9 % bolus 1,000 mL (has no administration in time range)  sodium chloride 0.9 % bolus 1,000 mL (1,000 mLs Intravenous New Bag/Given 06/23/22 0008)   2:26 AM Patient's elevated urine specific gravity is consistent with dehydration, likely the cause of his syncopal episode.  We will give him a second liter of normal saline.  He is not currently on antiretrovirals but states he intends to follow-up with the infectious disease clinic soon.  He was encouraged to do so.   PROCEDURES  Procedures   ED DIAGNOSES     ICD-10-CM   1. Syncope and collapse  R55     2. Dehydration  E86.0          Prerana Strayer, 08/22/22, MD 06/23/22 08/22/22    [id number], MD 06/23/22 [phone]

## 2022-06-22 NOTE — ED Provider Triage Note (Signed)
Emergency Medicine Provider Triage Evaluation Note  Blake Chambers , a 32 y.o. male  was evaluated in triage.  Uncooperative on exam, will not answer my questions. Per nursing triage, pt had episode of LOC today. When EMS arrived, he was alert and oriented. Unable to obtain further history from patient. Per nursing staff, pt here frequently with similar complaints and is usually uncooperative and refuses to leave ED if discharged.   Review of Systems  Positive: Unable to obtain Negative: Unable to obtain  Physical Exam  BP (!) 132/91 (BP Location: Left Arm)   Pulse (!) 103   Temp 98.4 F (36.9 C) (Oral)   Resp 18   SpO2 99%  Gen:   no distress, sleeping on entry to exam room, arouses easily Resp:  Normal effort LCTA MSK:   Moves extremities easily and equally x 4 Other:  Arouses to verbal stimuli, makes eye contact, in no acute distress, when I try to answer questions and assess patient he refuses to cooperate with interview, will follow some commands but during most of exam will make eye contact with me when talking to him and then close his eyes and try to go back to sleep  Medical Decision Making  Medically screening exam initiated at 6:49 PM.  Appropriate orders placed.  Verlyn Lambert was informed that the remainder of the evaluation will be completed by another provider, this initial triage assessment does not replace that evaluation, and the importance of remaining in the ED until their evaluation is complete.     Suzzette Righter, PA-C 06/22/22 [id number]

## 2022-06-22 NOTE — ED Triage Notes (Signed)
Pt presents with c/o LOC. Pt was walking outside and said he had an LOC. Pt was alert and oriented when EMS arrived but he reported the positive LOC. Pt reported to EMS he remembers being cold and then having the syncopal episode.

## 2022-06-23 DIAGNOSIS — R55 Syncope and collapse: Secondary | ICD-10-CM | POA: Diagnosis not present

## 2022-06-23 LAB — BASIC METABOLIC PANEL
Anion gap: 6 (ref 5–15)
BUN: 11 mg/dL (ref 6–20)
CO2: 23 mmol/L (ref 22–32)
Calcium: 8.3 mg/dL — ABNORMAL LOW (ref 8.9–10.3)
Chloride: 104 mmol/L (ref 98–111)
Creatinine, Ser: 1.02 mg/dL (ref 0.61–1.24)
GFR, Estimated: >60 mL/min (ref >60)
Glucose, Bld: 87 mg/dL (ref 70–99)
Potassium: 3.8 mmol/L (ref 3.5–5.1)
Sodium: 133 mmol/L — ABNORMAL LOW (ref 135–145)

## 2022-06-23 LAB — URINALYSIS, ROUTINE W REFLEX MICROSCOPIC
Bacteria, UA: NONE SEEN
Bilirubin Urine: NEGATIVE
Glucose, UA: NEGATIVE mg/dL
Hgb urine dipstick: NEGATIVE
Ketones, ur: NEGATIVE mg/dL
Leukocytes,Ua: NEGATIVE
Nitrite: NEGATIVE
Protein, ur: 30 mg/dL — AB
Specific Gravity, Urine: 1.03 (ref 1.005–1.030)
pH: 5 (ref 5.0–8.0)

## 2022-06-23 LAB — RAPID URINE DRUG SCREEN, HOSP PERFORMED
Amphetamines: POSITIVE — AB
Barbiturates: NOT DETECTED
Benzodiazepines: NOT DETECTED
Cocaine: NOT DETECTED
Opiates: NOT DETECTED
Tetrahydrocannabinol: NOT DETECTED

## 2022-06-23 MED ORDER — SODIUM CHLORIDE 0.9 % IV BOLUS
1000.0000 mL | Freq: Once | INTRAVENOUS | Status: AC
  Administered 2022-06-23: 1000 mL via INTRAVENOUS

## 2022-06-25 ENCOUNTER — Emergency Department (HOSPITAL_BASED_OUTPATIENT_CLINIC_OR_DEPARTMENT_OTHER): Payer: No Typology Code available for payment source | Admitting: Radiology

## 2022-06-25 ENCOUNTER — Encounter (HOSPITAL_BASED_OUTPATIENT_CLINIC_OR_DEPARTMENT_OTHER): Payer: Self-pay

## 2022-06-25 ENCOUNTER — Other Ambulatory Visit: Payer: Self-pay

## 2022-06-25 ENCOUNTER — Emergency Department (HOSPITAL_BASED_OUTPATIENT_CLINIC_OR_DEPARTMENT_OTHER)
Admission: EM | Admit: 2022-06-25 | Discharge: 2022-06-25 | Disposition: A | Discharge status: Home / Self Care | Payer: No Typology Code available for payment source | Attending: Emergency Medicine | Admitting: Emergency Medicine

## 2022-06-25 DIAGNOSIS — Z5900 Homelessness unspecified: Secondary | ICD-10-CM | POA: Diagnosis not present

## 2022-06-25 DIAGNOSIS — M79672 Pain in left foot: Secondary | ICD-10-CM | POA: Insufficient documentation

## 2022-06-25 DIAGNOSIS — Z21 Asymptomatic human immunodeficiency virus [HIV] infection status: Secondary | ICD-10-CM | POA: Diagnosis not present

## 2022-06-25 MED ORDER — KETOROLAC TROMETHAMINE 60 MG/2ML IM SOLN
60.0000 mg | Freq: Once | INTRAMUSCULAR | Status: AC
  Administered 2022-06-25: 60 mg via INTRAMUSCULAR
  Filled 2022-06-25: qty 2

## 2022-06-25 MED ORDER — ACETAMINOPHEN 325 MG PO TABS
650.0000 mg | ORAL_TABLET | Freq: Once | ORAL | Status: AC
  Administered 2022-06-25: 650 mg via ORAL
  Filled 2022-06-25: qty 2

## 2022-06-25 NOTE — ED Notes (Signed)
Pt given ginger ale, crackers, apple juice per his request. Pt needs bus pass.

## 2022-06-25 NOTE — ED Triage Notes (Signed)
Patient BIB [facility] from Home.  Endorses Left Foot Pain that was noted upon awakening today. Cramping-Like and Constant. Radiates to leg. EMS States Bilateral leg Pain but Patient only endorses Pain to left.   VSS with EMS besides Tachycardia at 115-120.   Uncomfortable during Triage. A&Ox4. GCS 15. BIB Wheelchair.

## 2022-06-25 NOTE — ED Notes (Signed)
Pt discharged home after verbalizing understanding of discharge instructions; nad noted.  Pt sleeping in and out during assessment, care, discharge.

## 2022-06-25 NOTE — ED Provider Notes (Signed)
[facility] EMERGENC[facility] DEPT Provider Note   CSN: [id number] Arrival date & time: 06/25/22  1307     History  Chief Complaint  Patient presents with   Foot Pain    Quavion Boule is a 32 y.o. male.  Patient here with acute on chronic left foot pain.  He is homeless.  Polysubstance abuse history.  He denies any trauma.  Denies any fevers or chills.  He is asking for something to eat and drink and some pain medicine.  Has been having to walk around more than normal.  Denies any chest pain or shortness of breath or fever or weakness or numbness.  The history is provided by the patient.       Home Medications Prior to Admission medications   Medication Sig Start Date End Date Taking? Authorizing Provider  acetaminophen (TYLENOL 8 HOUR) 650 MG CR tablet Take 1 tablet (650 mg total) by mouth every 8 (eight) hours as needed for pain. 04/18/22   Suzy Bouchard, PA-C  bictegravir-emtricitabine-tenofovir AF (BIKTARVY) 50-200-25 MG TABS tablet Take 1 tablet by mouth daily. Patient not taking: Reported on 05/20/2022 12/21/21   Garald Balding, PA-C  ibuprofen (ADVIL) 600 MG tablet Take 1 tablet (600 mg total) by mouth every 6 (six) hours as needed. Patient taking differently: Take 200-600 mg by mouth every 6 (six) hours as needed for moderate pain. 06/30/21   Blanchie Dessert, MD      Allergies    Tenofovir disoproxil and Kiwi extract    Review of Systems   Review of Systems  Physical Exam Updated Vital Signs BP 125/84 (BP Location: Right Arm)   Pulse (!) 115   Temp 98.2 F (36.8 C) (Oral)   Resp 18   Ht 5\' 9"  (1.753 m)   Wt 68 kg   SpO2 95%   BMI 22.14 kg/m  Physical Exam Vitals and nursing note reviewed.  Constitutional:      General: He is not in acute distress.    Appearance: He is well-developed. He is not ill-appearing.  HENT:     Head: Normocephalic and atraumatic.  Eyes:     Extraocular Movements: Extraocular movements intact.      Conjunctiva/sclera: Conjunctivae normal.     Pupils: Pupils are equal, round, and reactive to light.  Cardiovascular:     Rate and Rhythm: Normal rate and regular rhythm.     Pulses: Normal pulses.     Heart sounds: No murmur heard. Pulmonary:     Effort: Pulmonary effort is normal. No respiratory distress.     Breath sounds: Normal breath sounds.  Abdominal:     Palpations: Abdomen is soft.     Tenderness: There is no abdominal tenderness.  Musculoskeletal:        General: Tenderness present. No swelling.     Cervical back: Normal range of motion and neck supple.     Comments: The base of the left foot, no obvious deformity  Skin:    General: Skin is warm and dry.     Capillary Refill: Capillary refill takes less than 2 seconds.     Findings: No bruising or erythema.  Neurological:     Mental Status: He is alert.  Psychiatric:        Mood and Affect: Mood normal.     ED Results / Procedures / Treatments   Labs (all labs ordered are listed, but only abnormal results are displayed) Labs Reviewed - No data to display  EKG None  Radiology DG Foot Complete Left  Result Date: 06/25/2022 CLINICAL DATA:  Foot pain and cramping EXAM: LEFT FOOT - COMPLETE 3+ VIEW COMPARISON:  06/30/2021 FINDINGS: There is no evidence of fracture or dislocation. No arthropathy. Stable Achilles calcaneal spur. IMPRESSION: 1. No acute findings. 2. Stable Achilles calcaneal spur. Electronically Signed   By: Van Clines M.D.   On: 06/25/2022 13:43    Procedures Procedures    Medications Ordered in ED Medications  acetaminophen (TYLENOL) tablet 650 mg (has no administration in time range)  ketorolac (TORADOL) injection 60 mg (has no administration in time range)    ED Course/ Medical Decision Making/ A&P                           Medical Decision Making Amount and/or Complexity of Data Reviewed Radiology: ordered.  Risk OTC drugs. Prescription drug management.   Dashiel Bergquist is  here with left foot pain.  History of homelessness, HIV.  Noncompliance.  He is asking mostly for food and drink.  He is having some ongoing left foot pain that he gets at times when he has to walk a lot.  He has had normal pulses in his feet.  No signs of trauma or deformity.  X-ray was obtained that showed no acute fracture or malalignment.  I have no concern for blood clot or peripheral arterial disease.  This is likely an acute on chronic process.  Likely overuse process.  Will put him in a walking boot.  He was given resources for shelters.  He was given something to eat and drink.  He was given a Toradol shot and Tylenol and discharged in the ED in good condition.  This chart was dictated using voice recognition software.  Despite best efforts to proofread,  errors can occur which can change the documentation meaning.         Final Clinical Impression(s) / ED Diagnoses Final diagnoses:  Left foot pain    Rx / DC Orders ED Discharge Orders     None         Lennice Sites, DO 06/25/22 1348

## 2022-06-27 ENCOUNTER — Other Ambulatory Visit (HOSPITAL_COMMUNITY): Payer: Self-pay

## 2022-06-27 ENCOUNTER — Encounter (HOSPITAL_BASED_OUTPATIENT_CLINIC_OR_DEPARTMENT_OTHER): Payer: Self-pay | Admitting: Emergency Medicine

## 2022-06-27 ENCOUNTER — Emergency Department (HOSPITAL_BASED_OUTPATIENT_CLINIC_OR_DEPARTMENT_OTHER)
Admission: EM | Admit: 2022-06-27 | Discharge: 2022-06-27 | Disposition: A | Discharge status: Home / Self Care | Payer: No Typology Code available for payment source | Attending: Emergency Medicine | Admitting: Emergency Medicine

## 2022-06-27 ENCOUNTER — Other Ambulatory Visit: Payer: Self-pay

## 2022-06-27 DIAGNOSIS — M79671 Pain in right foot: Secondary | ICD-10-CM | POA: Diagnosis present

## 2022-06-27 DIAGNOSIS — Z21 Asymptomatic human immunodeficiency virus [HIV] infection status: Secondary | ICD-10-CM | POA: Insufficient documentation

## 2022-06-27 MED ORDER — IBUPROFEN 800 MG PO TABS
800.0000 mg | ORAL_TABLET | Freq: Once | ORAL | Status: AC
  Administered 2022-06-27: 800 mg via ORAL
  Filled 2022-06-27: qty 1

## 2022-06-27 NOTE — Discharge Instructions (Signed)
Wear well-fitting, padded shoes.  Rest.  Follow-up with your primary doctor for any problems.

## 2022-06-27 NOTE — ED Triage Notes (Signed)
  Patient comes in with bilateral foot pain that has been going on for 5 days.  Patient denies any injury that he can recall.  Patient seen a few days ago and given cam walker for L foot pain.  Pain 10/10, aching in both feet.

## 2022-06-27 NOTE — ED Provider Notes (Signed)
[facility] EMERGENC[facility] DEPT Provider Note   CSN: [id number] Arrival date & time: 06/27/22  0109     History  Chief Complaint  Patient presents with   Foot Pain    Blake Chambers is a 32 y.o. male.  Patient is a 32 year old male with past medical history of HIV disease, homelessness, bipolar.  Patient presenting with complaints of foot pain.  Patient presents here frequently with the same complaint.  Today's complaint involves his right foot, but was seen yesterday with left foot pain.  He tells me he walks a lot, but denies any specific injury or trauma.  The history is provided by the patient.       Home Medications Prior to Admission medications   Medication Sig Start Date End Date Taking? Authorizing Provider  acetaminophen (TYLENOL 8 HOUR) 650 MG CR tablet Take 1 tablet (650 mg total) by mouth every 8 (eight) hours as needed for pain. 04/18/22   Suzy Bouchard, PA-C  bictegravir-emtricitabine-tenofovir AF (BIKTARVY) 50-200-25 MG TABS tablet Take 1 tablet by mouth daily. Patient not taking: Reported on 05/20/2022 12/21/21   Garald Balding, PA-C  ibuprofen (ADVIL) 600 MG tablet Take 1 tablet (600 mg total) by mouth every 6 (six) hours as needed. Patient taking differently: Take 200-600 mg by mouth every 6 (six) hours as needed for moderate pain. 06/30/21   Blanchie Dessert, MD      Allergies    Tenofovir disoproxil and Kiwi extract    Review of Systems   Review of Systems  All other systems reviewed and are negative.   Physical Exam Updated Vital Signs BP 128/89 (BP Location: Right Arm)   Pulse 100   Temp 98.2 F (36.8 C) (Oral)   Resp 20   Ht 5\' 9"  (1.753 m)   Wt 67.6 kg   SpO2 99%   BMI 22.00 kg/m  Physical Exam Vitals and nursing note reviewed.  Constitutional:      Appearance: Normal appearance.  Pulmonary:     Effort: Pulmonary effort is normal.  Musculoskeletal:     Comments: Right foot has no significant swelling, erythema, or  sores/skin breakdown.  There are some calluses to the heel and the balls of the foot, but no evidence for cellulitis.  Skin:    General: Skin is warm and dry.  Neurological:     Mental Status: He is alert and oriented to person, place, and time.     ED Results / Procedures / Treatments   Labs (all labs ordered are listed, but only abnormal results are displayed) Labs Reviewed - No data to display  EKG None  Radiology DG Foot Complete Left  Result Date: 06/25/2022 CLINICAL DATA:  Foot pain and cramping EXAM: LEFT FOOT - COMPLETE 3+ VIEW COMPARISON:  06/30/2021 FINDINGS: There is no evidence of fracture or dislocation. No arthropathy. Stable Achilles calcaneal spur. IMPRESSION: 1. No acute findings. 2. Stable Achilles calcaneal spur. Electronically Signed   By: Van Clines M.D.   On: 06/25/2022 13:43    Procedures Procedures    Medications Ordered in ED Medications  ibuprofen (ADVIL) tablet 800 mg (has no administration in time range)    ED Course/ Medical Decision Making/ A&P  Patient presenting with foot pain that has been a chronic problem for him for quite some time.  Patient is homeless and reports walking a lot.  There is no specific injury or trauma and I see no reason to obtain imaging.  Patient to be discharged with ibuprofen,  well fitting shoes, and follow-up as needed.  Final Clinical Impression(s) / ED Diagnoses Final diagnoses:  None    Rx / DC Orders ED Discharge Orders     None         Veryl Speak, MD 06/27/22 [phone]

## 2022-06-27 NOTE — ED Notes (Addendum)
Went to discuss pt's discharge instructions. Pt states he is unable to walk to bus stop.  Pt states he has been able to get assistance with transportation before and is requesting same.

## 2022-07-14 ENCOUNTER — Encounter (HOSPITAL_COMMUNITY): Payer: Self-pay

## 2022-07-14 ENCOUNTER — Emergency Department (HOSPITAL_COMMUNITY)
Admission: EM | Admit: 2022-07-14 | Discharge: 2022-07-14 | Disposition: A | Discharge status: Home / Self Care | Payer: No Typology Code available for payment source | Attending: Emergency Medicine | Admitting: Emergency Medicine

## 2022-07-14 ENCOUNTER — Other Ambulatory Visit: Payer: Self-pay

## 2022-07-14 DIAGNOSIS — E1165 Type 2 diabetes mellitus with hyperglycemia: Secondary | ICD-10-CM | POA: Diagnosis not present

## 2022-07-14 DIAGNOSIS — M79672 Pain in left foot: Secondary | ICD-10-CM | POA: Diagnosis not present

## 2022-07-14 DIAGNOSIS — Z21 Asymptomatic human immunodeficiency virus [HIV] infection status: Secondary | ICD-10-CM | POA: Insufficient documentation

## 2022-07-14 LAB — CBG MONITORING, ED: Glucose-Capillary: 125 mg/dL — ABNORMAL HIGH (ref 70–99)

## 2022-07-14 MED ORDER — ACETAMINOPHEN 325 MG PO TABS
650.0000 mg | ORAL_TABLET | Freq: Once | ORAL | Status: AC
  Administered 2022-07-14: 650 mg via ORAL
  Filled 2022-07-14: qty 2
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
## 2022-07-14 NOTE — ED Triage Notes (Signed)
Ar[place] EMS from streets with ongoing left foot pain.

## 2022-07-14 NOTE — ED Triage Notes (Signed)
Pt [place] EMS from gas station with reports of foot pain. Pt  reports recently being seen for same. Pt reports not taking medication for HIV and diabetes.

## 2022-07-14 NOTE — Discharge Instructions (Addendum)
Continue with Tylenol or ibuprofen for management of pain.  Follow-up with your primary care doctor.

## 2022-07-17 ENCOUNTER — Emergency Department (HOSPITAL_COMMUNITY)
Admission: EM | Admit: 2022-07-17 | Discharge: 2022-07-17 | Disposition: A | Discharge status: Home / Self Care | Payer: No Typology Code available for payment source | Attending: Emergency Medicine | Admitting: Emergency Medicine

## 2022-07-17 DIAGNOSIS — M79671 Pain in right foot: Secondary | ICD-10-CM | POA: Diagnosis present

## 2022-07-17 DIAGNOSIS — Z5900 Homelessness unspecified: Secondary | ICD-10-CM | POA: Diagnosis not present

## 2022-07-17 DIAGNOSIS — R000 Tachycardia, unspecified: Secondary | ICD-10-CM | POA: Insufficient documentation

## 2022-07-17 MED ORDER — ACETAMINOPHEN 325 MG PO TABS
650.0000 mg | ORAL_TABLET | Freq: Once | ORAL | Status: AC
  Administered 2022-07-17: 650 mg via ORAL
  Filled 2022-07-17: qty 2

## 2022-07-17 MED ORDER — KETOROLAC TROMETHAMINE 15 MG/ML IJ SOLN
15.0000 mg | Freq: Once | INTRAMUSCULAR | Status: DC
  Filled 2022-07-17: qty 1

## 2022-07-17 NOTE — ED Triage Notes (Signed)
Patient arrived with complaints of ongoing right foot pain

## 2022-07-17 NOTE — Discharge Instructions (Addendum)
Your exam today is overall reassuring.  You received a shot of Toradol, and dose of Tylenol.  For any concerning symptoms return to the emergency department.  Otherwise follow-up with your primary care provider.  Take Tylenol and ibuprofen as you need to for pain control.
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
## 2022-07-31 ENCOUNTER — Emergency Department (HOSPITAL_COMMUNITY)
Admission: EM | Admit: 2022-07-31 | Discharge: 2022-07-31 | Disposition: A | Discharge status: Home / Self Care | Payer: No Typology Code available for payment source | Attending: Emergency Medicine | Admitting: Emergency Medicine

## 2022-07-31 ENCOUNTER — Encounter (HOSPITAL_COMMUNITY): Payer: Self-pay

## 2022-07-31 ENCOUNTER — Other Ambulatory Visit: Payer: Self-pay

## 2022-07-31 DIAGNOSIS — H5712 Ocular pain, left eye: Secondary | ICD-10-CM | POA: Diagnosis not present

## 2022-07-31 DIAGNOSIS — H5713 Ocular pain, bilateral: Secondary | ICD-10-CM

## 2022-07-31 DIAGNOSIS — H5711 Ocular pain, right eye: Secondary | ICD-10-CM | POA: Diagnosis present

## 2022-07-31 MED ORDER — FLUORESCEIN SODIUM 1 MG OP STRP
1.0000 | ORAL_STRIP | Freq: Once | OPHTHALMIC | Status: DC
  Filled 2022-07-31: qty 1

## 2022-07-31 MED ORDER — TETRACAINE HCL 0.5 % OP SOLN
2.0000 [drp] | Freq: Once | OPHTHALMIC | Status: AC
  Administered 2022-07-31: 2 [drp] via OPHTHALMIC
  Filled 2022-07-31: qty 4
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
## 2022-07-31 NOTE — Discharge Instructions (Signed)
Your exam today was reassuring.  However you did not allow [place] to fully irrigate the eyes, or do a fluorescein exam.  If you have not thoroughly rinsed the eyes please do so when you are able.  You stated you did have your eyes irrigated prior to arrival.  If your vision worsens, you have any concerning symptoms return to the emergency department otherwise follow-up with your primary care provider.

## 2022-07-31 NOTE — ED Notes (Signed)
Provided pt ginger ale and bus pass
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
## 2022-08-04 IMAGING — CR DG HAND COMPLETE 3+V*L*
3 series · 3 of 3 positions shown · non-contrast
Comparison: None.

CLINICAL DATA: Swelling.

EXAM:
LEFT HAND - COMPLETE 3+ VIEW

[hand pa]
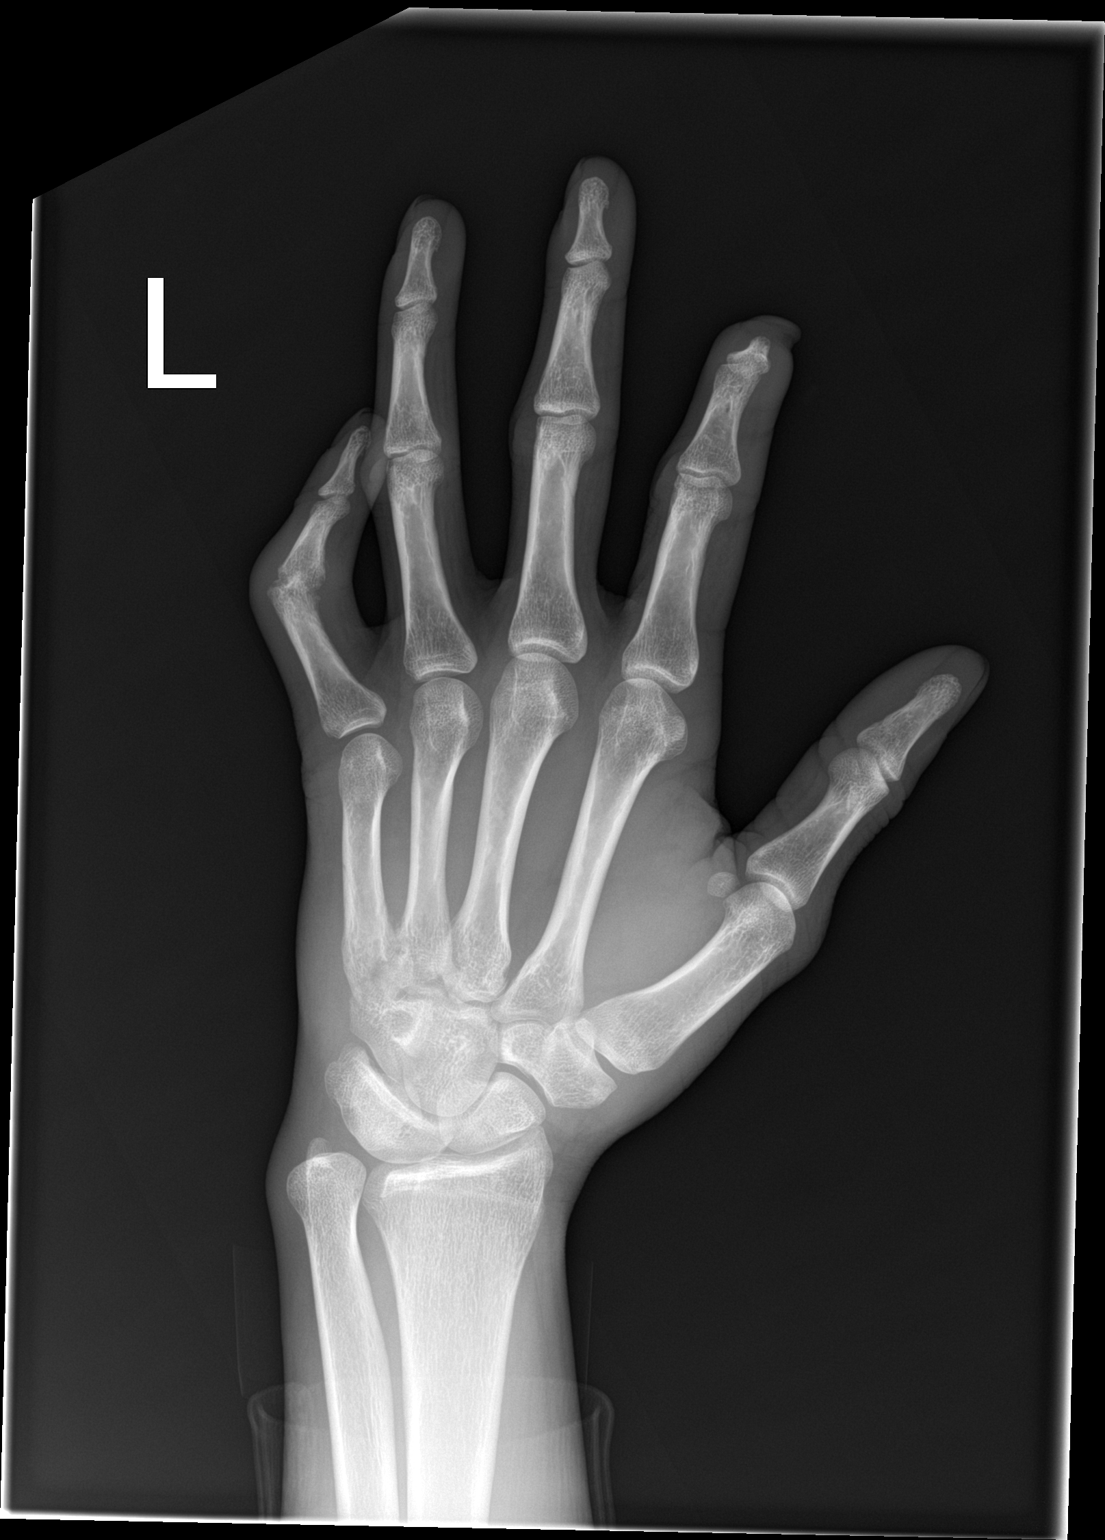

[hand obl]
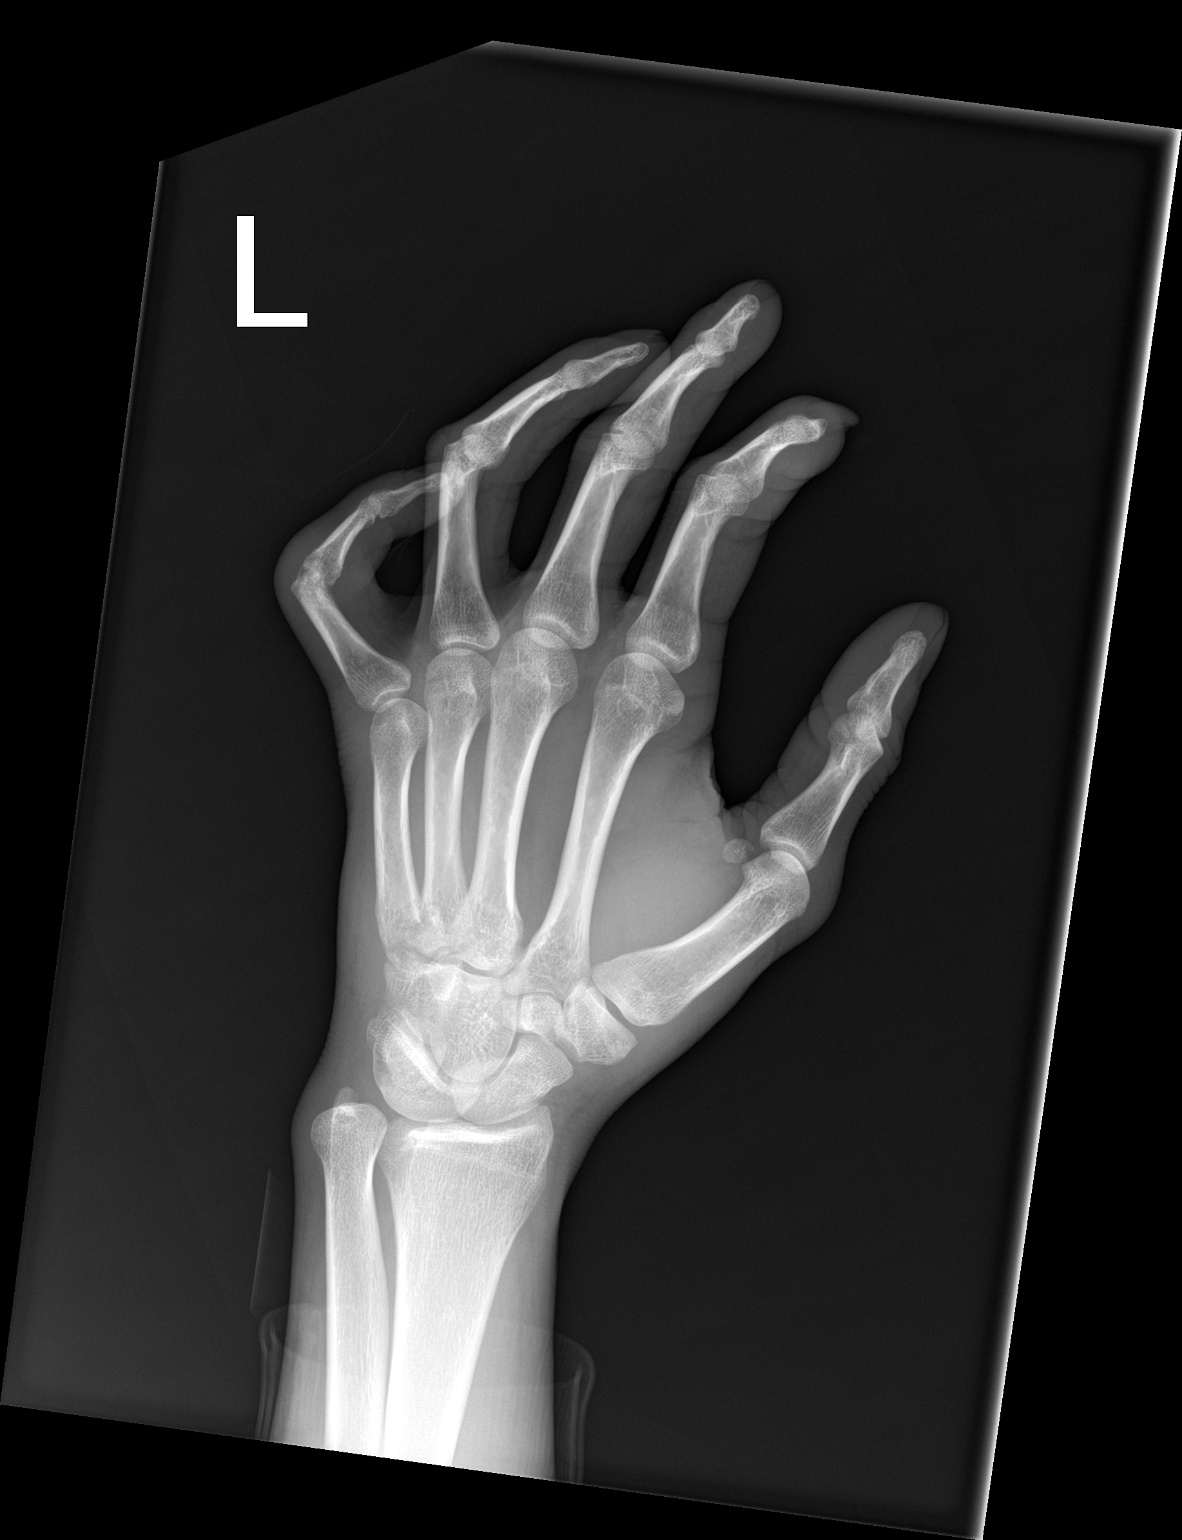

[hand lat]
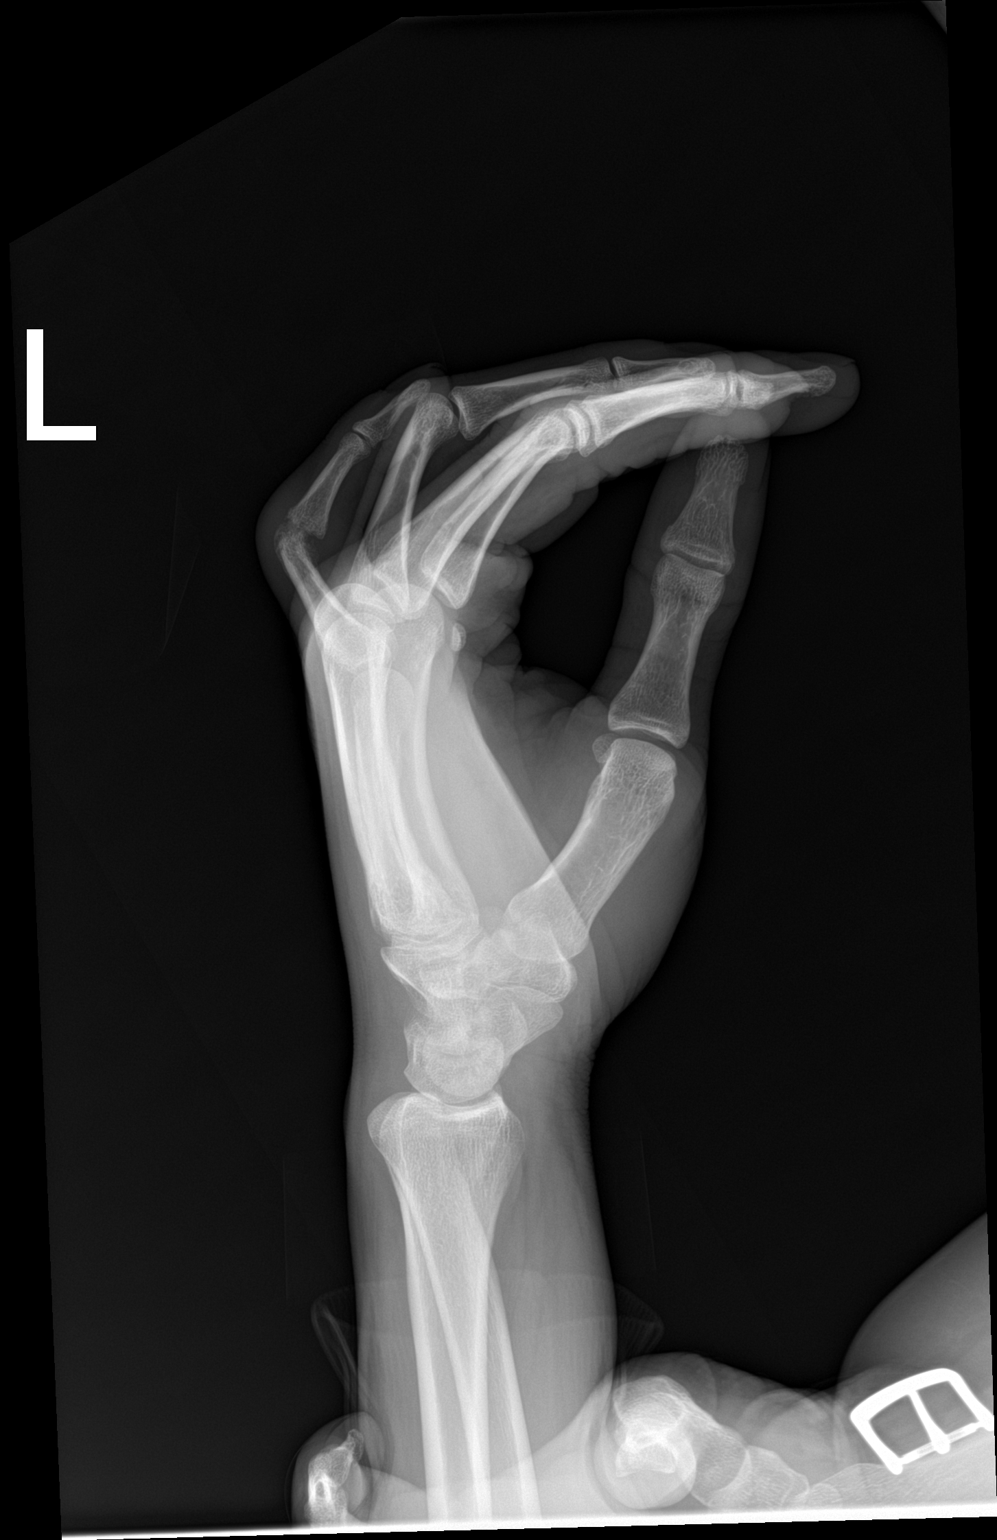

[3 of 3 positions shown; findings below may reference images not displayed]

FINDINGS: Erosive changes about the fifth PIP joint, with overlying soft
tissue swelling. Presumed fixed flexion deformity at the fifth PIP
joint, but patient uncooperative during the exam.

Remainder of the osseous structures of the LEFT hand appear normal.
No other sites of erosive change. Soft tissues about the LEFT hand
are otherwise unremarkable.
IMPRESSION: Erosive changes about the fifth PIP joint with overlying soft tissue
swelling. Findings are compatible with an inflammatory arthritis
such as psoriatic arthritis. Gout is also a consideration.
Osteomyelitis not excluded.

## 2022-08-04 IMAGING — CR DG FINGER LITTLE 2+V*L*
3 series · 3 of 3 positions shown · non-contrast
Comparison: None.

CLINICAL DATA: Swelling

EXAM:
LEFT LITTLE FINGER 2+V

[finger ap]
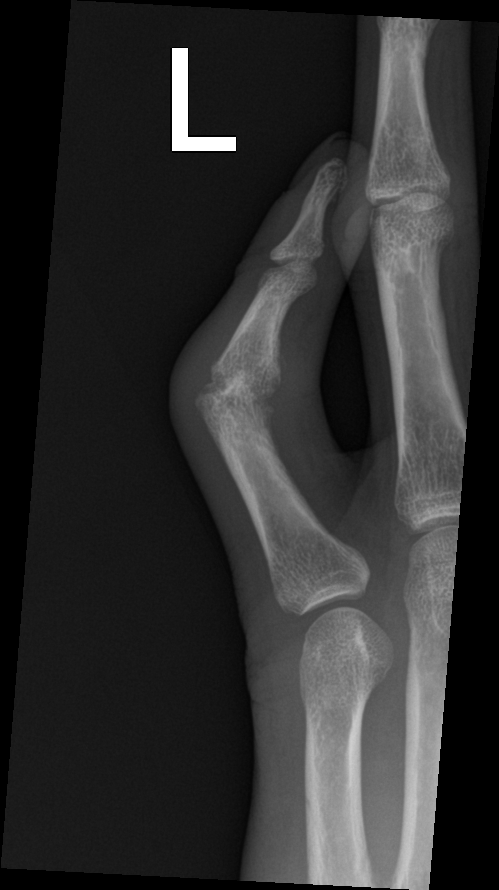

[finger lat]
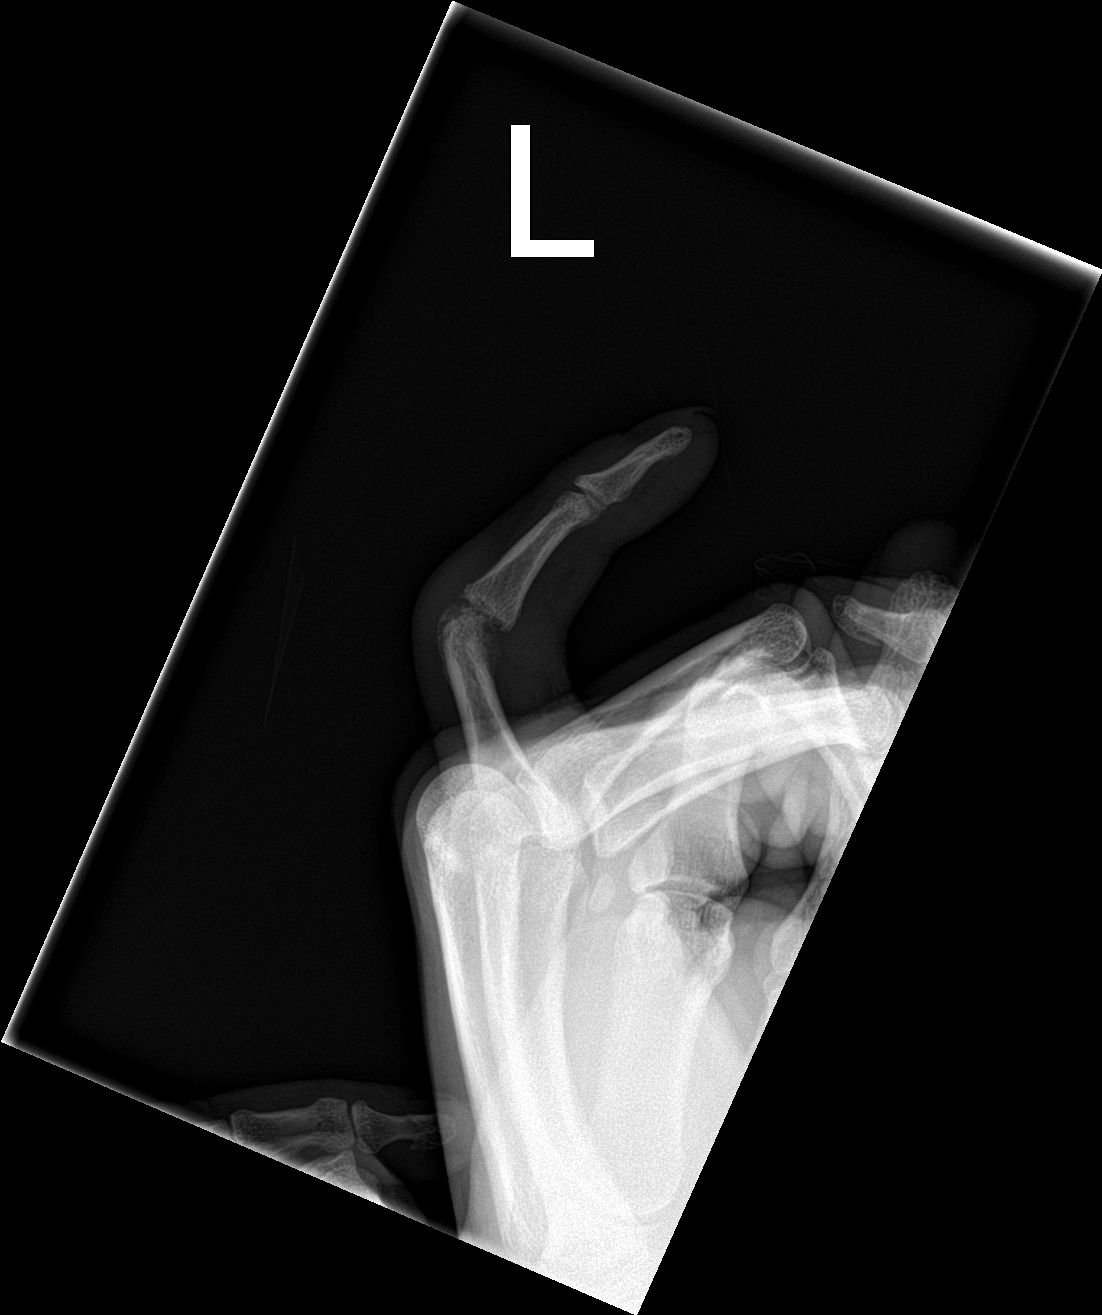

[finger obl]
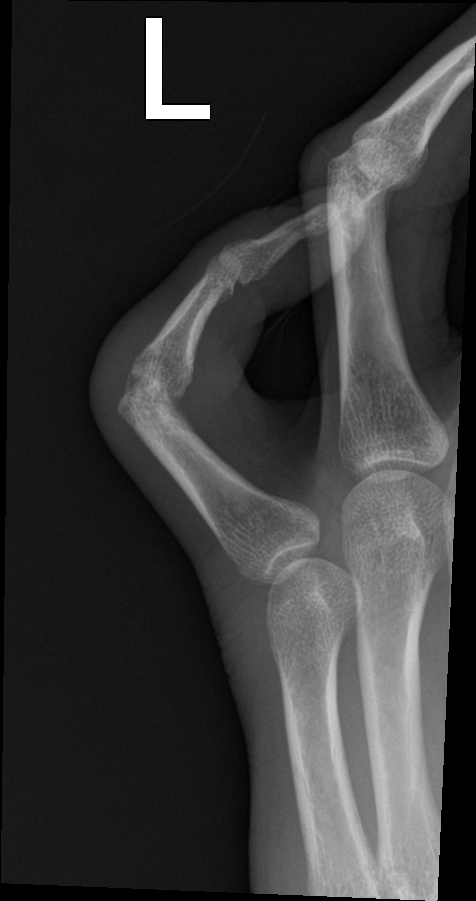

[3 of 3 positions shown; findings below may reference images not displayed]

FINDINGS: Erosive changes at the fifth PIP joint with overlying soft tissue
swelling. Fifth MTP and DIP joints are well preserved. No soft
tissue gas is seen. No radiodense foreign body.
IMPRESSION: Erosive changes at the fifth PIP joint, with overlying soft tissue
swelling. Findings are compatible with an inflammatory arthritis
such as psoriatic arthritis. Gout is also a consideration.
Osteomyelitis is not excluded in the appropriate clinical setting.

## 2022-08-23 ENCOUNTER — Emergency Department (HOSPITAL_COMMUNITY): Payer: No Typology Code available for payment source

## 2022-08-23 ENCOUNTER — Emergency Department (HOSPITAL_COMMUNITY)
Admission: EM | Admit: 2022-08-23 | Discharge: 2022-08-23 | Disposition: A | Discharge status: Home / Self Care | Payer: No Typology Code available for payment source | Attending: Emergency Medicine | Admitting: Emergency Medicine

## 2022-08-23 ENCOUNTER — Encounter (HOSPITAL_COMMUNITY): Payer: Self-pay

## 2022-08-23 DIAGNOSIS — W208XXA Other cause of strike by thrown, projected or falling object, initial encounter: Secondary | ICD-10-CM | POA: Insufficient documentation

## 2022-08-23 DIAGNOSIS — M79671 Pain in right foot: Secondary | ICD-10-CM | POA: Insufficient documentation
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
## 2022-08-24 ENCOUNTER — Emergency Department (HOSPITAL_COMMUNITY): Payer: No Typology Code available for payment source

## 2022-08-24 ENCOUNTER — Other Ambulatory Visit: Payer: Self-pay

## 2022-08-24 ENCOUNTER — Emergency Department (HOSPITAL_COMMUNITY)
Admission: EM | Admit: 2022-08-24 | Discharge: 2022-08-24 | Disposition: A | Discharge status: Home / Self Care | Payer: No Typology Code available for payment source | Attending: Emergency Medicine | Admitting: Emergency Medicine

## 2022-08-24 DIAGNOSIS — S40212A Abrasion of left shoulder, initial encounter: Secondary | ICD-10-CM | POA: Diagnosis not present

## 2022-08-24 DIAGNOSIS — R911 Solitary pulmonary nodule: Secondary | ICD-10-CM | POA: Diagnosis not present

## 2022-08-24 DIAGNOSIS — S2231XA Fracture of one rib, right side, initial encounter for closed fracture: Secondary | ICD-10-CM | POA: Diagnosis not present

## 2022-08-24 DIAGNOSIS — S27321A Contusion of lung, unilateral, initial encounter: Secondary | ICD-10-CM | POA: Diagnosis not present

## 2022-08-24 DIAGNOSIS — Y92481 Parking lot as the place of occurrence of the external cause: Secondary | ICD-10-CM | POA: Insufficient documentation

## 2022-08-24 DIAGNOSIS — R0789 Other chest pain: Secondary | ICD-10-CM | POA: Diagnosis present

## 2022-08-24 DIAGNOSIS — M79672 Pain in left foot: Secondary | ICD-10-CM | POA: Insufficient documentation

## 2022-08-24 LAB — CBC WITH DIFFERENTIAL/PLATELET
Abs Immature Granulocytes: 0.09 10*3/uL — ABNORMAL HIGH (ref 0.00–0.07)
Basophils Absolute: 0 10*3/uL (ref 0.0–0.1)
Basophils Relative: 0 %
Eosinophils Absolute: 0.1 10*3/uL (ref 0.0–0.5)
Eosinophils Relative: 1 %
HCT: 45.8 % (ref 39.0–52.0)
Hemoglobin: 14.2 g/dL (ref 13.0–17.0)
Immature Granulocytes: 1 %
Lymphocytes Relative: 20 %
Lymphs Abs: 1.4 10*3/uL (ref 0.7–4.0)
MCH: 25.5 pg — ABNORMAL LOW (ref 26.0–34.0)
MCHC: 31 g/dL (ref 30.0–36.0)
MCV: 82.2 fL (ref 80.0–100.0)
Monocytes Absolute: 0.6 10*3/uL (ref 0.1–1.0)
Monocytes Relative: 9 %
Neutro Abs: 4.7 10*3/uL (ref 1.7–7.7)
Neutrophils Relative %: 69 %
Platelets: 341 10*3/uL (ref 150–400)
RBC: 5.57 MIL/uL (ref 4.22–5.81)
RDW: 16.6 % — ABNORMAL HIGH (ref 11.5–15.5)
WBC: 6.9 10*3/uL (ref 4.0–10.5)
nRBC: 0 % (ref 0.0–0.2)

## 2022-08-24 LAB — HEPATIC FUNCTION PANEL
ALT: 32 U/L (ref 0–44)
AST: 36 U/L (ref 15–41)
Albumin: 3.5 g/dL (ref 3.5–5.0)
Alkaline Phosphatase: 64 U/L (ref 38–126)
Bilirubin, Direct: 0.1 mg/dL (ref 0.0–0.2)
Indirect Bilirubin: 0.5 mg/dL (ref 0.3–0.9)
Total Bilirubin: 0.6 mg/dL (ref 0.3–1.2)
Total Protein: 9.5 g/dL — ABNORMAL HIGH (ref 6.5–8.1)

## 2022-08-24 LAB — BASIC METABOLIC PANEL
Anion gap: <3 — ABNORMAL LOW (ref 5–15)
BUN: 12 mg/dL (ref 6–20)
CO2: 28 mmol/L (ref 22–32)
Calcium: 8.6 mg/dL — ABNORMAL LOW (ref 8.9–10.3)
Chloride: 102 mmol/L (ref 98–111)
Creatinine, Ser: 1.16 mg/dL (ref 0.61–1.24)
GFR, Estimated: >60 mL/min (ref >60)
Glucose, Bld: 81 mg/dL (ref 70–99)
Potassium: 3.8 mmol/L (ref 3.5–5.1)
Sodium: 132 mmol/L — ABNORMAL LOW (ref 135–145)

## 2022-08-24 LAB — I-STAT CHEM 8, ED
BUN: 12 mg/dL (ref 6–20)
Calcium, Ion: 1.18 mmol/L (ref 1.15–1.40)
Chloride: 100 mmol/L (ref 98–111)
Creatinine, Ser: 1.2 mg/dL (ref 0.61–1.24)
Glucose, Bld: 81 mg/dL (ref 70–99)
HCT: 47 % (ref 39.0–52.0)
Hemoglobin: 16 g/dL (ref 13.0–17.0)
Potassium: 4 mmol/L (ref 3.5–5.1)
Sodium: 138 mmol/L (ref 135–145)
TCO2: 28 mmol/L (ref 22–32)

## 2022-08-24 LAB — TYPE AND SCREEN
ABO/RH(D): O POS
Antibody Screen: NEGATIVE

## 2022-08-24 LAB — LACTIC ACID, PLASMA: Lactic Acid, Venous: 0.9 mmol/L (ref 0.5–1.9)

## 2022-08-24 LAB — PROTIME-INR
INR: 1.1 (ref 0.8–1.2)
Prothrombin Time: 14.2 seconds (ref 11.4–15.2)

## 2022-08-24 LAB — SAMPLE TO BLOOD BANK

## 2022-08-24 LAB — ABO/RH: ABO/RH(D): O POS

## 2022-08-24 MED ORDER — IOHEXOL 300 MG/ML  SOLN
100.0000 mL | Freq: Once | INTRAMUSCULAR | Status: AC | PRN
  Administered 2022-08-24: 100 mL via INTRAVENOUS

## 2022-08-24 MED ORDER — IBUPROFEN 200 MG PO TABS
400.0000 mg | ORAL_TABLET | Freq: Once | ORAL | Status: AC
  Administered 2022-08-24: 400 mg via ORAL
  Filled 2022-08-24: qty 2

## 2022-08-24 MED ORDER — SODIUM CHLORIDE 0.9 % IV BOLUS: 500.0000 mL | Freq: Once | INTRAVENOUS | Status: DC
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
## 2022-08-24 NOTE — ED Notes (Signed)
Pt given sandwich and ginger ale and crackers and peanut butter.  Pt eating at this time
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
## 2022-08-24 NOTE — ED Notes (Signed)
Pt refused IV and ECG. Pt was explained purporse of both, but stated he absolutely did not want either. Pt stated writer could draw his blood, which was done.

## 2022-08-25 ENCOUNTER — Encounter (HOSPITAL_COMMUNITY): Payer: Self-pay

## 2022-08-25 ENCOUNTER — Emergency Department (HOSPITAL_COMMUNITY)
Admission: EM | Admit: 2022-08-25 | Discharge: 2022-08-26 | Disposition: A | Discharge status: Home / Self Care | Payer: No Typology Code available for payment source | Attending: Emergency Medicine | Admitting: Emergency Medicine

## 2022-08-25 ENCOUNTER — Other Ambulatory Visit: Payer: Self-pay

## 2022-08-25 DIAGNOSIS — M79642 Pain in left hand: Secondary | ICD-10-CM | POA: Diagnosis not present

## 2022-08-25 DIAGNOSIS — Z21 Asymptomatic human immunodeficiency virus [HIV] infection status: Secondary | ICD-10-CM | POA: Diagnosis not present

## 2022-08-25 DIAGNOSIS — Z5900 Homelessness unspecified: Secondary | ICD-10-CM | POA: Diagnosis not present

## 2022-08-25 DIAGNOSIS — M79641 Pain in right hand: Secondary | ICD-10-CM | POA: Diagnosis present

## 2022-08-25 DIAGNOSIS — R0781 Pleurodynia: Secondary | ICD-10-CM | POA: Diagnosis not present
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
## 2022-08-26 ENCOUNTER — Other Ambulatory Visit: Payer: Self-pay

## 2022-08-26 ENCOUNTER — Emergency Department (HOSPITAL_COMMUNITY): Payer: No Typology Code available for payment source

## 2022-08-26 ENCOUNTER — Encounter (HOSPITAL_COMMUNITY): Payer: Self-pay | Admitting: Emergency Medicine

## 2022-08-26 ENCOUNTER — Emergency Department (HOSPITAL_COMMUNITY)
Admission: EM | Admit: 2022-08-26 | Discharge: 2022-08-27 | Disposition: A | Discharge status: Home / Self Care | Payer: No Typology Code available for payment source | Source: Home / Self Care

## 2022-08-26 DIAGNOSIS — Z21 Asymptomatic human immunodeficiency virus [HIV] infection status: Secondary | ICD-10-CM | POA: Insufficient documentation

## 2022-08-26 DIAGNOSIS — R0781 Pleurodynia: Secondary | ICD-10-CM | POA: Insufficient documentation

## 2022-08-26 LAB — CBC WITH DIFFERENTIAL/PLATELET
Abs Immature Granulocytes: 0.04 10*3/uL (ref 0.00–0.07)
Basophils Absolute: 0 10*3/uL (ref 0.0–0.1)
Basophils Relative: 0 %
Eosinophils Absolute: 0.2 10*3/uL (ref 0.0–0.5)
Eosinophils Relative: 2 %
HCT: 41.3 % (ref 39.0–52.0)
Hemoglobin: 13.4 g/dL (ref 13.0–17.0)
Immature Granulocytes: 1 %
Lymphocytes Relative: 30 %
Lymphs Abs: 1.9 10*3/uL (ref 0.7–4.0)
MCH: 26.1 pg (ref 26.0–34.0)
MCHC: 32.4 g/dL (ref 30.0–36.0)
MCV: 80.5 fL (ref 80.0–100.0)
Monocytes Absolute: 0.6 10*3/uL (ref 0.1–1.0)
Monocytes Relative: 10 %
Neutro Abs: 3.6 10*3/uL (ref 1.7–7.7)
Neutrophils Relative %: 57 %
Platelets: 316 10*3/uL (ref 150–400)
RBC: 5.13 MIL/uL (ref 4.22–5.81)
RDW: 16.3 % — ABNORMAL HIGH (ref 11.5–15.5)
WBC: 6.2 10*3/uL (ref 4.0–10.5)
nRBC: 0 % (ref 0.0–0.2)

## 2022-08-26 LAB — BASIC METABOLIC PANEL
Anion gap: 10 (ref 5–15)
BUN: 19 mg/dL (ref 6–20)
CO2: 24 mmol/L (ref 22–32)
Calcium: 8.6 mg/dL — ABNORMAL LOW (ref 8.9–10.3)
Chloride: 102 mmol/L (ref 98–111)
Creatinine, Ser: 1.02 mg/dL (ref 0.61–1.24)
GFR, Estimated: >60 mL/min (ref >60)
Glucose, Bld: 144 mg/dL — ABNORMAL HIGH (ref 70–99)
Potassium: 4.2 mmol/L (ref 3.5–5.1)
Sodium: 136 mmol/L (ref 135–145)

## 2022-08-26 LAB — TROPONIN I (HIGH SENSITIVITY): Troponin I (High Sensitivity): 5 ng/L (ref <18)

## 2022-08-26 MED ORDER — IBUPROFEN 800 MG PO TABS
800.0000 mg | ORAL_TABLET | Freq: Once | ORAL | Status: AC
  Administered 2022-08-26: 800 mg via ORAL
  Filled 2022-08-26: qty 1
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
## 2022-08-27 LAB — TROPONIN I (HIGH SENSITIVITY): Troponin I (High Sensitivity): 5 ng/L (ref <18)
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
## 2022-08-29 ENCOUNTER — Other Ambulatory Visit: Payer: Self-pay

## 2022-08-29 ENCOUNTER — Encounter (HOSPITAL_COMMUNITY): Payer: Self-pay

## 2022-08-29 ENCOUNTER — Emergency Department (HOSPITAL_COMMUNITY)
Admission: EM | Admit: 2022-08-29 | Discharge: 2022-08-29 | Disposition: A | Discharge status: Home / Self Care | Payer: No Typology Code available for payment source | Attending: Emergency Medicine | Admitting: Emergency Medicine

## 2022-08-29 DIAGNOSIS — Z21 Asymptomatic human immunodeficiency virus [HIV] infection status: Secondary | ICD-10-CM | POA: Diagnosis not present

## 2022-08-29 DIAGNOSIS — S50812A Abrasion of left forearm, initial encounter: Secondary | ICD-10-CM | POA: Insufficient documentation

## 2022-08-29 DIAGNOSIS — Z87891 Personal history of nicotine dependence: Secondary | ICD-10-CM | POA: Insufficient documentation

## 2022-08-29 DIAGNOSIS — S59912A Unspecified injury of left forearm, initial encounter: Secondary | ICD-10-CM | POA: Diagnosis present
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
## 2022-08-29 NOTE — ED Notes (Signed)
Patient states "he thinks he received a tetanus shot 6 months" but is not for certain.
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
## 2022-09-02 ENCOUNTER — Encounter (HOSPITAL_COMMUNITY): Payer: Self-pay

## 2022-09-02 ENCOUNTER — Emergency Department (HOSPITAL_COMMUNITY): Payer: No Typology Code available for payment source

## 2022-09-02 ENCOUNTER — Other Ambulatory Visit: Payer: Self-pay

## 2022-09-02 ENCOUNTER — Emergency Department (HOSPITAL_COMMUNITY)
Admission: EM | Admit: 2022-09-02 | Discharge: 2022-09-02 | Disposition: A | Discharge status: Home / Self Care | Payer: No Typology Code available for payment source | Source: Home / Self Care | Attending: Emergency Medicine | Admitting: Emergency Medicine

## 2022-09-02 ENCOUNTER — Emergency Department (HOSPITAL_COMMUNITY)
Admission: EM | Admit: 2022-09-02 | Discharge: 2022-09-02 | Disposition: A | Discharge status: Home / Self Care | Payer: No Typology Code available for payment source | Attending: Emergency Medicine | Admitting: Emergency Medicine

## 2022-09-02 DIAGNOSIS — R52 Pain, unspecified: Secondary | ICD-10-CM | POA: Diagnosis not present

## 2022-09-02 DIAGNOSIS — M79605 Pain in left leg: Secondary | ICD-10-CM

## 2022-09-02 DIAGNOSIS — M25462 Effusion, left knee: Secondary | ICD-10-CM

## 2022-09-02 DIAGNOSIS — Z21 Asymptomatic human immunodeficiency virus [HIV] infection status: Secondary | ICD-10-CM | POA: Insufficient documentation

## 2022-09-02 MED ORDER — NAPROXEN 500 MG PO TABS
500.0000 mg | ORAL_TABLET | Freq: Once | ORAL | Status: AC
  Administered 2022-09-02: 500 mg via ORAL
  Filled 2022-09-02: qty 1

## 2022-09-02 MED ORDER — IBUPROFEN 200 MG PO TABS
600.0000 mg | ORAL_TABLET | Freq: Once | ORAL | Status: AC
  Administered 2022-09-02: 600 mg via ORAL
  Filled 2022-09-02: qty 3
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
## 2022-09-02 NOTE — Progress Notes (Signed)
Left lower extremity venous duplex has been completed. Preliminary results can be found in CV Proc through chart review.  Results were given to Genevive Bi PA.  09/02/22 9:02 AM Blake Chambers RVT
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
## 2022-09-02 NOTE — Discharge Instructions (Signed)
Return to the ED with any new or worsening signs or symptoms such as fevers, reduced range of motion to left knee You may continue taking ibuprofen or Tylenol every 6 hours as needed for pain Please utilize the RICE protocol.  This stands for rest, ice, compression and elevation.  Please elevate your left leg while sleeping.  Please rest when able to do so.  He may also apply ice. Please read the attached guide concerning the effusions

## 2022-09-02 NOTE — Discharge Instructions (Signed)
You are seen in the emergency department for left leg pain.  Your x-ray of the left hip was negative for any fractures or dislocations.  He also had imaging performed earlier today as well as an ultrasound which were all negative with no signs of any clots.  Please manage her pain at home as best as you can with Tylenol or ibuprofen.  Also try to refrain from significant walking or physical activity for the next few days to allow the area to recover and become more comfortable.

## 2022-09-03 ENCOUNTER — Emergency Department (HOSPITAL_COMMUNITY)
Admission: EM | Admit: 2022-09-03 | Discharge: 2022-09-03 | Discharge status: Against Medical Advice | Payer: No Typology Code available for payment source

## 2022-09-03 ENCOUNTER — Emergency Department (HOSPITAL_COMMUNITY)
Admission: EM | Admit: 2022-09-03 | Discharge: 2022-09-03 | Disposition: A | Discharge status: Home / Self Care | Payer: No Typology Code available for payment source | Attending: Emergency Medicine | Admitting: Emergency Medicine

## 2022-09-03 DIAGNOSIS — Z7689 Persons encountering health services in other specified circumstances: Secondary | ICD-10-CM | POA: Insufficient documentation

## 2022-09-03 DIAGNOSIS — Z5900 Homelessness unspecified: Secondary | ICD-10-CM | POA: Diagnosis present

## 2022-09-03 NOTE — ED Provider Notes (Signed)
[place] EMERGENCY DEPARTMENT AT [facility] Provider Note   CSN: [id number] Arrival date & time: 09/03/22  [id number]     History  Chief Complaint  Patient presents with   Homeless    Blaiden Lac is a 32 y.o. male.  The history is provided by the patient and medical records.   22 male presenting to the ED for shelter.  He was seen earlier this evening and discharged.  He was found to be sleeping in the lobby and was told he could not sleep there so needed to check-in or relief.  States he checked back in because he did not want to be out in the cold.  He has no acute complaints.  Home Medications Prior to Admission medications   Medication Sig Start Date End Date Taking? Authorizing Provider  acetaminophen (TYLENOL 8 HOUR) 650 MG CR tablet Take 1 tablet (650 mg total) by mouth every 8 (eight) hours as needed for pain. 04/18/22   Suzy Bouchard, PA-C  bictegravir-emtricitabine-tenofovir AF (BIKTARVY) 50-200-25 MG TABS tablet Take 1 tablet by mouth daily. Patient not taking: Reported on 05/20/2022 12/21/21   Garald Balding, PA-C  ibuprofen (ADVIL) 600 MG tablet Take 1 tablet (600 mg total) by mouth every 6 (six) hours as needed. Patient taking differently: Take 200-600 mg by mouth every 6 (six) hours as needed for moderate pain. 06/30/21   Blanchie Dessert, MD      Allergies    Tenofovir disoproxil and Kiwi extract    Review of Systems   Review of Systems  Constitutional:        Homeless  All other systems reviewed and are negative.   Physical Exam Updated Vital Signs BP 110/81 (BP Location: Left Arm)   Pulse 71   Temp 98 F (36.7 C) (Oral)   Resp 16   SpO2 99%  Physical Exam Vitals and nursing note reviewed.  Constitutional:      Appearance: He is well-developed.  HENT:     Head: Normocephalic and atraumatic.  Eyes:     Conjunctiva/sclera: Conjunctivae normal.     Pupils: Pupils are equal, round, and reactive to light.  Cardiovascular:     Rate  and Rhythm: Normal rate and regular rhythm.     Heart sounds: Normal heart sounds.  Pulmonary:     Effort: Pulmonary effort is normal.     Breath sounds: Normal breath sounds.  Abdominal:     General: Bowel sounds are normal.     Palpations: Abdomen is soft.  Musculoskeletal:        General: Normal range of motion.     Cervical back: Normal range of motion.  Skin:    General: Skin is warm and dry.  Neurological:     Mental Status: He is alert and oriented to person, place, and time.     ED Results / Procedures / Treatments   Labs (all labs ordered are listed, but only abnormal results are displayed) Labs Reviewed - No data to display  EKG None  Radiology   Procedures Procedures    Medications Ordered in ED Medications - No data to display  ED Course/ Medical Decision Making/ A&P                             Medical Decision Making  31 year old male well-known to the ED, here requesting shelter.  Seen earlier this evening and has been sleeping in the lobby.  He was approached by security and told to either check-in or leave so decided to check back in as it is cold outside.  He has no acute complaints.  His vitals are stable.  He does not require any emergent workup.  Stable for discharge.  He was given list of local shelters/warming stations.  Final Clinical Impression(s) / ED Diagnoses Final diagnoses:  Homeless    Rx / DC Orders ED Discharge Orders     None         Larene Pickett, PA-C 09/03/22 0349    Quintella Reichert, MD 09/03/22 [phone]

## 2022-09-03 NOTE — Discharge Instructions (Signed)
You can go to one of the homeless shelters.

## 2022-09-03 NOTE — ED Triage Notes (Signed)
Patient was discharged and sleeping in the lobby, states he was told he had to leave or check back in to be seen. States its cold and doesn't have anywhere to go.

## 2022-09-25 ENCOUNTER — Encounter (HOSPITAL_COMMUNITY): Payer: Self-pay

## 2022-09-25 ENCOUNTER — Emergency Department (HOSPITAL_COMMUNITY)
Admission: EM | Admit: 2022-09-25 | Discharge: 2022-09-26 | Disposition: A | Discharge status: Home / Self Care | Payer: No Typology Code available for payment source | Attending: Emergency Medicine | Admitting: Emergency Medicine

## 2022-09-25 ENCOUNTER — Other Ambulatory Visit: Payer: Self-pay

## 2022-09-25 DIAGNOSIS — K0889 Other specified disorders of teeth and supporting structures: Secondary | ICD-10-CM | POA: Diagnosis not present

## 2022-09-25 DIAGNOSIS — Z5900 Homelessness unspecified: Secondary | ICD-10-CM | POA: Insufficient documentation

## 2022-09-25 DIAGNOSIS — M549 Dorsalgia, unspecified: Secondary | ICD-10-CM | POA: Insufficient documentation

## 2022-09-25 DIAGNOSIS — M545 Low back pain, unspecified: Secondary | ICD-10-CM

## 2022-09-25 DIAGNOSIS — Z21 Asymptomatic human immunodeficiency virus [HIV] infection status: Secondary | ICD-10-CM | POA: Insufficient documentation

## 2022-09-25 NOTE — ED Triage Notes (Signed)
Complaining of back pain today has gotten worse over the last 2-3 hours.

## 2022-09-26 MED ORDER — CYCLOBENZAPRINE HCL 10 MG PO TABS
10.0000 mg | ORAL_TABLET | Freq: Once | ORAL | Status: AC
  Administered 2022-09-26: 10 mg via ORAL
  Filled 2022-09-26: qty 1

## 2022-09-26 MED ORDER — KETOROLAC TROMETHAMINE 60 MG/2ML IM SOLN
30.0000 mg | Freq: Once | INTRAMUSCULAR | Status: DC
  Filled 2022-09-26: qty 2

## 2022-09-26 MED ORDER — DOXYCYCLINE HYCLATE 100 MG PO CAPS: 100.0000 mg | ORAL_CAPSULE | Freq: Two times a day (BID) | ORAL | 0 refills | Status: AC

## 2022-09-26 MED ORDER — PENICILLIN V POTASSIUM 250 MG PO TABS
500.0000 mg | ORAL_TABLET | Freq: Once | ORAL | Status: AC
  Administered 2022-09-26: 500 mg via ORAL
  Filled 2022-09-26: qty 2

## 2022-09-26 MED ORDER — CYCLOBENZAPRINE HCL 10 MG PO TABS: 10.0000 mg | ORAL_TABLET | Freq: Two times a day (BID) | ORAL | 0 refills | Status: DC | PRN

## 2022-09-26 NOTE — ED Provider Triage Note (Signed)
Emergency Medicine Provider Triage Evaluation Note  Blake Chambers , a 32 y.o. male  was evaluated in triage.  Pt complains of low back pain after lifting some bags.  No history of back problems.  No focal weakness, numbness or tingling.  No bowel or bladder incontinence.  No history of IV drug abuse..  Review of Systems  Positive: Low back pain Negative: Incontinence, weakness, numbness, tingling patient  Physical Exam  BP 119/87 (BP Location: Left Arm)   Pulse 76   Temp 98 F (36.7 C) (Oral)   Resp 18   Ht 5\' 9"  (1.753 m)   Wt 65.8 kg   SpO2 98%   BMI 21.41 kg/m  Gen:   Awake, no distress   Resp:  Normal effort  MSK:   Moves extremities without difficulty. Other:  5/5 strength in lower extremities.  Intact PT pulses  Medical Decision Making  Medically screening exam initiated at 12:38 AM.  Appropriate orders placed.  Blake Chambers was informed that the remainder of the evaluation will be completed by another provider, this initial triage assessment does not replace that evaluation, and the importance of remaining in the ED until their evaluation is complete.     Blake Octave, MD 09/26/22 [phone]

## 2022-09-26 NOTE — Discharge Instructions (Addendum)
Dental pain-likely dental cavity, started on antibiotics please take as prescribed.  I have given you information for dentist this is within the area please call for a follow-up appointment Back pain-muscular, have given you a muscle relaxer take as prescribed medication make you drowsy do not consume alcohol or operate heavy she will take this medication.  Follow-up with community health and wellness for further assessment. Shelters-given information for shelters within the area  Come back to the emergency department if you develop chest pain, shortness of breath, severe abdominal pain, uncontrolled nausea, vomiting, diarrhea.

## 2022-09-26 NOTE — ED Provider Notes (Signed)
[place] EMERGENCY DEPARTMENT AT [facility] Provider Note   CSN: [id number] Arrival date & time: 09/25/22  2350     History  Chief Complaint  Patient presents with   Back Pain    Blake Chambers is a 32 y.o. male.  HPI   Patient with medical history including , bipolar, HIV, homeless presenting with complaints of back pain.  Patient states that back pain started earlier today, states it happened while he was walking, states that he was carrying his tent in his gear and states that put a muscle spasm in his back, patient states pain remains constant, does not radiate, no associated paresthesia or weakness in his lower extremities.  No urinary or bowel incontinency's.  Denies any recent trauma.  Also notes that he is having pain in his tooth, also started today, his right upper canine, no associate facial swelling tongue throat lip swelling difficulty breathing or swallowing.  Reviewed patient's chart has been seen multiple times for variety of different reasons, this month  patient has been seen 10 times.  Home Medications Prior to Admission medications   Medication Sig Start Date End Date Taking? Authorizing Provider  cyclobenzaprine (FLEXERIL) 10 MG tablet Take 1 tablet (10 mg total) by mouth 2 (two) times daily as needed for muscle spasms. 09/26/22  Yes Carroll Sage, PA-C  doxycycline (VIBRAMYCIN) 100 MG capsule Take 1 capsule (100 mg total) by mouth 2 (two) times daily for 7 days. 09/26/22 10/03/22 Yes Carroll Sage, PA-C  acetaminophen (TYLENOL 8 HOUR) 650 MG CR tablet Take 1 tablet (650 mg total) by mouth every 8 (eight) hours as needed for pain. 04/18/22   Mannie Stabile, PA-C  bictegravir-emtricitabine-tenofovir AF (BIKTARVY) 50-200-25 MG TABS tablet Take 1 tablet by mouth daily. Patient not taking: Reported on 05/20/2022 12/21/21   Mare Ferrari, PA-C  ibuprofen (ADVIL) 600 MG tablet Take 1 tablet (600 mg total) by mouth every 6 (six) hours as  needed. Patient taking differently: Take 200-600 mg by mouth every 6 (six) hours as needed for moderate pain. 06/30/21   Gwyneth Sprout, MD      Allergies    Tenofovir disoproxil and Kiwi extract    Review of Systems   Review of Systems  Constitutional:  Negative for chills and fever.  HENT:  Positive for dental problem.   Respiratory:  Negative for shortness of breath.   Cardiovascular:  Negative for chest pain.  Gastrointestinal:  Negative for abdominal pain.  Musculoskeletal:  Positive for back pain.  Neurological:  Negative for headaches.    Physical Exam Updated Vital Signs BP 128/88   Pulse 78   Temp 98 F (36.7 C) (Oral)   Resp 18   Ht  (1.753 m)   Wt 65.8 kg   SpO2 97%   BMI 21.41 kg/m  Physical Exam Vitals and nursing note reviewed.  Constitutional:      General: He is not in acute distress.    Appearance: He is not ill-appearing.  HENT:     Head: Normocephalic and atraumatic.     Nose: No congestion.     Mouth/Throat:     Mouth: Mucous membranes are moist.     Pharynx: Oropharynx is clear.     Comments: No facial swelling, no overlying skin changes, no trismus no torticollis, tongue uvula both midline controlling oral secretions tonsils both are symmetric bilaterally, no submandibular swelling no muffled voice.  Patient has poor dental hygiene, right upper canine is nearly  eroded away, no gingivitis, no palpable abscess present. Eyes:     Conjunctiva/sclera: Conjunctivae normal.  Cardiovascular:     Rate and Rhythm: Normal rate and regular rhythm.     Pulses: Normal pulses.  Pulmonary:     Effort: Pulmonary effort is normal.  Abdominal:     Palpations: Abdomen is soft.     Tenderness: There is no abdominal tenderness. There is no right CVA tenderness or left CVA tenderness.  Musculoskeletal:     Comments: Spine was palpated was nontender to palpation no step-off deformities noted, patient had pain noted in the musculature on the left right iliac  crest, both focalized and easily reproducible.  He has 5-5 strength neurovascular tact in lower extremities bilaterally.  Skin:    General: Skin is warm and dry.  Neurological:     Mental Status: He is alert.  Psychiatric:        Mood and Affect: Mood normal.     ED Results / Procedures / Treatments   Labs (all labs ordered are listed, but only abnormal results are displayed) Labs Reviewed - No data to display  EKG None  Radiology No results found.  Procedures Procedures    Medications Ordered in ED Medications  ketorolac (TORADOL) injection 30 mg (30 mg Intramuscular Patient Refused/Not Given 09/26/22 0425)  cyclobenzaprine (FLEXERIL) tablet 10 mg (10 mg Oral Given 09/26/22 0419)  penicillin v potassium (VEETID) tablet 500 mg (500 mg Oral Given 09/26/22 0419)    ED Course/ Medical Decision Making/ A&P                             Medical Decision Making Risk Prescription drug management.   This patient presents to the ED for concern of back pain, dental pain, this involves an extensive number of treatment options, and is a complaint that carries with it a high risk of complications and morbidity.  The differential diagnosis includes Ludwig angina, periorbital cellulitis, Spina equina    Additional history obtained:  Additional history obtained from N/A External records from outside source obtained and reviewed including recent ER notes   Co morbidities that complicate the patient evaluation  Psychiatric disorder  Social Determinants of Health:  Homeless    Lab Tests:  I Ordered, and personally interpreted labs.  The pertinent results include:  n/a   Imaging Studies ordered:  I ordered imaging studies including n/a  I independently visualized and interpreted imaging which showed n/a I agree with the radiologist interpretation   Cardiac Monitoring:  The patient was maintained on a cardiac monitor.  I personally viewed and interpreted the cardiac  monitored which showed an underlying rhythm of: n/a   Medicines ordered and prescription drug management:  I ordered medication including Flexeril, penicillin I have reviewed the patients home medicines and have made adjustments as needed  Critical Interventions:  N/a   Reevaluation:  Presents with back pain as well as dental pain, he had a benign physical exam will provide patient with with a muscle relaxer, antibiotics, and continue to monitor  Patient was reassessed feeling better, patient was given a soda, sandwich, and new close as his pants were wet.  He is agreement discharge at this time   Consultations Obtained:  N/a    Test Considered:  N/A    Rule out I have low suspicion for peritonsillar abscess, retropharyngeal abscess, or Ludwig angina as oropharynx was visualized tongue and uvula were both midline, there is  no exudates, erythema or edema noted in the posterior pillars or on/ around tonsils.  Low suspicion for an abscess as gumline were palpated no fluctuance or induration felt.  Low suspicion for periorbital or orbital cellulitis as patient face had no erythematous, patient EOMs were intact, he had no pain with eye movement.I have low suspicion for spinal fracture or spinal cord abnormality as patient denies urinary incontinency, retention, difficulty with bowel movements, denies saddle paresthesias.  Spine was palpated there is no step-off, crepitus or gross deformities felt, patient had 5/5 strength, full range of motion, neurovascular fully intact in the lower extremities.  Suspicion for epidural abscess/discitis is low at this time patient is atypical, having no pain along his spine, pain is focalized to reduce along the musculature surrounding his left and right iliac crest.  Doubt AAA or dissection as again pain is reproducible worsened with movement.      Dispostion and problem list  After consideration of the diagnostic results and the patients response  to treatment, I feel that the patent would benefit from discharge.  Back pain-muscular nature, will provide a muscle relaxer, follow-up with PCP for further evaluation. Dental pain-likely dental cavity, will start antibiotics follow-up with a dentist. Homelessness-will provide outpatient resources for shelters within the area.            Final Clinical Impression(s) / ED Diagnoses Final diagnoses:  None    Rx / DC Orders ED Discharge Orders          Ordered    doxycycline (VIBRAMYCIN) 100 MG capsule  2 times daily        09/26/22 0545    cyclobenzaprine (FLEXERIL) 10 MG tablet  2 times daily PRN        09/26/22 0545              Carroll Sage, PA-C 09/26/22 6237    [name], MD 09/26/22 [phone]

## 2022-10-04 ENCOUNTER — Emergency Department (HOSPITAL_COMMUNITY)
Admission: EM | Admit: 2022-10-04 | Discharge: 2022-10-04 | Disposition: A | Discharge status: Home / Self Care | Payer: No Typology Code available for payment source | Attending: Emergency Medicine | Admitting: Emergency Medicine

## 2022-10-04 ENCOUNTER — Other Ambulatory Visit: Payer: Self-pay

## 2022-10-04 DIAGNOSIS — M79671 Pain in right foot: Secondary | ICD-10-CM | POA: Insufficient documentation

## 2022-10-04 DIAGNOSIS — Z21 Asymptomatic human immunodeficiency virus [HIV] infection status: Secondary | ICD-10-CM | POA: Diagnosis not present

## 2022-10-04 DIAGNOSIS — N182 Chronic kidney disease, stage 2 (mild): Secondary | ICD-10-CM | POA: Insufficient documentation

## 2022-10-04 MED ORDER — KETOROLAC TROMETHAMINE 15 MG/ML IJ SOLN
15.0000 mg | Freq: Once | INTRAMUSCULAR | Status: DC
  Filled 2022-10-04: qty 1

## 2022-10-04 MED ORDER — IBUPROFEN 400 MG PO TABS
600.0000 mg | ORAL_TABLET | Freq: Once | ORAL | Status: AC
  Administered 2022-10-04: 600 mg via ORAL
  Filled 2022-10-04: qty 1

## 2022-10-04 NOTE — Discharge Instructions (Signed)
You were evaluated today for right sided foot pain. You may take over the counter medication such as ibuprofen or acetaminophen for pain relief. Follow up with your primary care provider.

## 2022-10-04 NOTE — ED Provider Notes (Signed)
[place] EMERGENCY DEPARTMENT AT [facility] Provider Note   CSN: [id number] Arrival date & time: 10/04/22  0321     History  Chief Complaint  Patient presents with   Foot Pain    Blake Chambers is a 32 y.o. male.  Patient presents to the emergency department via EMS complaining of right-sided foot pain after walking in the rain.  He denies any trauma or injury to the area.  Patient with history of multiple visits for the same complaint over the past few months.  Patient is homeless.  Past medical history significant for bipolar 1 disorder, HIV, chronic renal insufficiency (stage II), cocaine use disorder, cannabis use disorder, anxiety  HPI     Home Medications Prior to Admission medications   Medication Sig Start Date End Date Taking? Authorizing Provider  acetaminophen (TYLENOL 8 HOUR) 650 MG CR tablet Take 1 tablet (650 mg total) by mouth every 8 (eight) hours as needed for pain. 04/18/22   Mannie Stabile, PA-C  bictegravir-emtricitabine-tenofovir AF (BIKTARVY) 50-200-25 MG TABS tablet Take 1 tablet by mouth daily. Patient not taking: Reported on 05/20/2022 12/21/21   Mare Ferrari, PA-C  cyclobenzaprine (FLEXERIL) 10 MG tablet Take 1 tablet (10 mg total) by mouth 2 (two) times daily as needed for muscle spasms. 09/26/22   Carroll Sage, PA-C  ibuprofen (ADVIL) 600 MG tablet Take 1 tablet (600 mg total) by mouth every 6 (six) hours as needed. Patient taking differently: Take 200-600 mg by mouth every 6 (six) hours as needed for moderate pain. 06/30/21   Gwyneth Sprout, MD      Allergies    Tenofovir disoproxil and Kiwi extract    Review of Systems   Review of Systems  Physical Exam Updated Vital Signs BP (!) 133/95 (BP Location: Left Arm)   Pulse 82   Temp 98.5 F (36.9 C)   Resp 16   SpO2 97%  Physical Exam Vitals and nursing note reviewed.  HENT:     Head: Normocephalic and atraumatic.     Mouth/Throat:     Mouth: Mucous membranes are  moist.  Eyes:     Conjunctiva/sclera: Conjunctivae normal.  Cardiovascular:     Rate and Rhythm: Normal rate.  Pulmonary:     Effort: Pulmonary effort is normal. No respiratory distress.  Musculoskeletal:        General: No signs of injury.     Cervical back: Normal range of motion.     Comments: No deformity noted to right foot.  There is a callus over the right lateral foot at the base of the fifth toe.  No signs of abscess or other acute finding.  Brisk cap refill.  DP pulse 2+.  Sensation intact.    Skin:    General: Skin is dry.  Neurological:     Mental Status: He is alert.  Psychiatric:        Speech: Speech normal.        Behavior: Behavior normal.     ED Results / Procedures / Treatments   Labs (all labs ordered are listed, but only abnormal results are displayed) Labs Reviewed - No data to display  EKG None  Radiology No results found.  Procedures Procedures    Medications Ordered in ED Medications  ketorolac (TORADOL) 15 MG/ML injection 15 mg (has no administration in time range)    ED Course/ Medical Decision Making/ A&P  Medical Decision Making Risk Prescription drug management.   Patient presents with chief complaint of foot pain.  Differential includes fracture, dislocation, soft tissue injury, and others  I reviewed the patient's past medical history including multiple visits for similar complaints of right foot pain, other musculoskeletal pain, and other complaints regarding housing insecurity.  Comorbidities include history of right foot pain  The patient's foot pain is reportedly related to walking in the rain, no dry close.  There is no indication at this time for lab work.  No signs of sepsis or infection.  With the patient reporting no trauma and having normal range of motion, I see no indication at this time for imaging of the right foot  I ordered the patient a dose of Toradol.  The patient is homeless which  is certainly contributing to his frequent visits.  No signs of infection or trauma to the foot.  Brisk cap refill at the toes.  Plan to discharge at this time with recommendations for over-the-counter medications such as acetaminophen and ibuprofen.  Will also provide resource guide for local shelters        Final Clinical Impression(s) / ED Diagnoses Final diagnoses:  Foot pain, right    Rx / DC Orders ED Discharge Orders     None         Pamala Duffel 10/04/22 0981    Rexford Maus, DO 10/04/22 1546

## 2022-10-04 NOTE — ED Triage Notes (Signed)
Pt arrived with EMS for R foot pain after walking around in the rain. Denies injury

## 2022-10-07 ENCOUNTER — Emergency Department (HOSPITAL_COMMUNITY)
Admission: EM | Admit: 2022-10-07 | Discharge: 2022-10-08 | Disposition: A | Discharge status: Home / Self Care | Payer: No Typology Code available for payment source | Attending: Emergency Medicine | Admitting: Emergency Medicine

## 2022-10-07 ENCOUNTER — Ambulatory Visit: Payer: No Typology Code available for payment source | Admitting: Physician Assistant

## 2022-10-07 ENCOUNTER — Other Ambulatory Visit: Payer: Self-pay

## 2022-10-07 DIAGNOSIS — M79641 Pain in right hand: Secondary | ICD-10-CM | POA: Insufficient documentation

## 2022-10-07 DIAGNOSIS — M79672 Pain in left foot: Secondary | ICD-10-CM | POA: Insufficient documentation

## 2022-10-07 DIAGNOSIS — M79642 Pain in left hand: Secondary | ICD-10-CM | POA: Diagnosis not present

## 2022-10-07 DIAGNOSIS — M79671 Pain in right foot: Secondary | ICD-10-CM | POA: Diagnosis not present

## 2022-10-07 DIAGNOSIS — Z21 Asymptomatic human immunodeficiency virus [HIV] infection status: Secondary | ICD-10-CM | POA: Insufficient documentation
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
## 2022-10-07 NOTE — ED Triage Notes (Signed)
Pt states he thinks he walked through poison ivy and c/o rash and numbness in left hand. No rash noted in triage.

## 2022-10-07 NOTE — ED Notes (Signed)
Pt refused blood work  

## 2022-10-08 NOTE — ED Provider Notes (Signed)
[place] EMERGENCY DEPARTMENT AT [facility] Provider Note   CSN: [id number] Arrival date & time: 10/07/22  2205     History  Chief Complaint  Patient presents with   Rash    Blake Chambers is a 32 y.o. male.  HPI   Patient with medical history including anxiety, HIV, psychiatric disorder presenting with complaints of pain in his hands and feet, states that this typically happens when he gets cold, states he will experience phantom syndrome.  Pain does not radiate up into his arms or into his legs, he denies any trauma to the area, is not endorsing any paresthesias or weakness.   Reviewed patient's chart seen multiple times for right different reasons mainly foot pain, was most recently seen on 04/20  Home Medications Prior to Admission medications   Medication Sig Start Date End Date Taking? Authorizing Provider  acetaminophen (TYLENOL 8 HOUR) 650 MG CR tablet Take 1 tablet (650 mg total) by mouth every 8 (eight) hours as needed for pain. 04/18/22   Mannie Stabile, PA-C  bictegravir-emtricitabine-tenofovir AF (BIKTARVY) 50-200-25 MG TABS tablet Take 1 tablet by mouth daily. Patient not taking: Reported on 05/20/2022 12/21/21   Mare Ferrari, PA-C  cyclobenzaprine (FLEXERIL) 10 MG tablet Take 1 tablet (10 mg total) by mouth 2 (two) times daily as needed for muscle spasms. 09/26/22   Carroll Sage, PA-C  ibuprofen (ADVIL) 600 MG tablet Take 1 tablet (600 mg total) by mouth every 6 (six) hours as needed. Patient taking differently: Take 200-600 mg by mouth every 6 (six) hours as needed for moderate pain. 06/30/21   Gwyneth Sprout, MD      Allergies    Tenofovir disoproxil and Kiwi extract    Review of Systems   Review of Systems  Constitutional:  Negative for chills and fever.  Respiratory:  Negative for shortness of breath.   Cardiovascular:  Negative for chest pain.  Gastrointestinal:  Negative for abdominal pain.  Neurological:  Negative for  headaches.    Physical Exam Updated Vital Signs BP 130/87   Pulse 82   Temp 98.9 F (37.2 C)   Resp 16   SpO2 98%  Physical Exam Vitals and nursing note reviewed.  Constitutional:      General: He is not in acute distress.    Appearance: He is not ill-appearing.  HENT:     Head: Normocephalic and atraumatic.     Nose: No congestion.  Eyes:     Conjunctiva/sclera: Conjunctivae normal.  Cardiovascular:     Rate and Rhythm: Normal rate and regular rhythm.     Pulses: Normal pulses.     Heart sounds: No murmur heard.    No friction rub. No gallop.  Pulmonary:     Effort: Pulmonary effort is normal.  Musculoskeletal:     Comments: Exam of the upper and lower extremities were benign, forage motion both the upper and lower extremities, he has 2+ radial pulses bilaterally, 2+ dorsal pedal pulses bilaterally, 2-second capillary refill in the upper extremities as well as lower extremities, sensation tact light touch, compartments are all soft, no overlying skin changes.  Skin:    General: Skin is warm and dry.     Comments: No noted rash on the patient's upper and/or lower extremities, no noted wounds present on the soles of the feet.  Neurological:     Mental Status: He is alert.  Psychiatric:        Mood and Affect: Mood normal.  ED Results / Procedures / Treatments   Labs (all labs ordered are listed, but only abnormal results are displayed) Labs Reviewed  CBC WITH DIFFERENTIAL/PLATELET  BASIC METABOLIC PANEL  LACTIC ACID, PLASMA  LACTIC ACID, PLASMA  CK    EKG None  Radiology No results found.  Procedures Procedures    Medications Ordered in ED Medications - No data to display  ED Course/ Medical Decision Making/ A&P                             Medical Decision Making Amount and/or Complexity of Data Reviewed Labs: ordered.   This patient presents to the ED for concern of hand and feet pain, this involves an extensive number of treatment options,  and is a complaint that carries with it a high risk of complications and morbidity.  The differential diagnosis includes limb ischemia, Guillain-Barr, rhabdomyolysis,     Additional history obtained:  Additional history obtained from N/A External records from outside source obtained and reviewed including recent ER notes   Co morbidities that complicate the patient evaluation  Psychiatric disorder  Social Determinants of Health:  N/A    Lab Tests:  I Ordered, and personally interpreted labs.  The pertinent results include: N/A   Imaging Studies ordered:  I ordered imaging studies including N/A I independently visualized and interpreted imaging which showed NA I agree with the radiologist interpretation   Cardiac Monitoring:  The patient was maintained on a cardiac monitor.  I personally viewed and interpreted the cardiac monitored which showed an underlying rhythm of: N/A   Medicines ordered and prescription drug management:  I ordered medication including n/a I have reviewed the patients home medicines and have made adjustments as needed  Critical Interventions:  N/a   Reevaluation:  Triage ordered lab work, patient refused, on reassessment patient not endorsing any complaints, he had a benign physical exam, he is in agreement with discharge at this time  Consultations Obtained:  N/a    Test Considered:  N/a    Rule out Suspicion for Guillain-Barr is low at this time as presentation atypical, would not expect sudden onset, nor sudden resolution.  I doubt rhabdomyolysis nontoxic-appearing, no noted limb edema, patient endorsing any pain at this time.  I doubt infectious etiology as he is nontoxic-appearing, vital signs reassuring.    Dispostion and problem list  After consideration of the diagnostic results and the patients response to treatment, I feel that the patent would benefit from discharge.  Feet and hand pain-since resolved, unclear  etiology, follow-up PCP for further evaluation.            Final Clinical Impression(s) / ED Diagnoses Final diagnoses:  Pain in both feet  Pain in both hands    Rx / DC Orders ED Discharge Orders     None         Barnie Del 10/08/22 1610    Nicanor Alcon, April, MD 10/08/22 [phone]

## 2022-10-08 NOTE — Discharge Instructions (Addendum)
Please follow-up with PCP for further evaluation.  Please review information regarding shelters within the area  Come back to the emergency department if you develop chest pain, shortness of breath, severe abdominal pain, uncontrolled nausea, vomiting, diarrhea.

## 2022-10-09 ENCOUNTER — Emergency Department (HOSPITAL_COMMUNITY)
Admission: EM | Admit: 2022-10-09 | Discharge: 2022-10-09 | Disposition: A | Discharge status: Home / Self Care | Payer: No Typology Code available for payment source | Attending: Emergency Medicine | Admitting: Emergency Medicine

## 2022-10-09 ENCOUNTER — Encounter (HOSPITAL_COMMUNITY): Payer: Self-pay

## 2022-10-09 ENCOUNTER — Other Ambulatory Visit: Payer: Self-pay

## 2022-10-09 DIAGNOSIS — X500XXA Overexertion from strenuous movement or load, initial encounter: Secondary | ICD-10-CM | POA: Diagnosis not present

## 2022-10-09 DIAGNOSIS — Z21 Asymptomatic human immunodeficiency virus [HIV] infection status: Secondary | ICD-10-CM | POA: Insufficient documentation

## 2022-10-09 DIAGNOSIS — M79641 Pain in right hand: Secondary | ICD-10-CM | POA: Diagnosis not present

## 2022-10-09 DIAGNOSIS — Y9301 Activity, walking, marching and hiking: Secondary | ICD-10-CM | POA: Diagnosis not present

## 2022-10-09 DIAGNOSIS — M79642 Pain in left hand: Secondary | ICD-10-CM | POA: Diagnosis not present

## 2022-10-09 DIAGNOSIS — M79674 Pain in right toe(s): Secondary | ICD-10-CM

## 2022-10-09 DIAGNOSIS — M79672 Pain in left foot: Secondary | ICD-10-CM | POA: Diagnosis not present

## 2022-10-09 DIAGNOSIS — F17210 Nicotine dependence, cigarettes, uncomplicated: Secondary | ICD-10-CM | POA: Insufficient documentation

## 2022-10-09 DIAGNOSIS — M79671 Pain in right foot: Secondary | ICD-10-CM | POA: Insufficient documentation

## 2022-10-09 MED ORDER — ACETAMINOPHEN 325 MG PO TABS
650.0000 mg | ORAL_TABLET | Freq: Once | ORAL | Status: AC
  Administered 2022-10-09: 650 mg via ORAL
  Filled 2022-10-09: qty 2

## 2022-10-09 NOTE — Discharge Instructions (Addendum)
Note the visit emergency department today was overall reassuring.  As discussed, see discharge instructions for local shelter information.  Attached is information for follow-up regarding primary care.  Take Tylenol/Motrin as needed for pain.  Please do not hesitate to return to emergency department the worrisome signs and symptoms we discussed become apparent.

## 2022-10-09 NOTE — ED Triage Notes (Signed)
Pt states he has been carrying some heavy plastic and walking a lot and is c/o painful hands and feet x 3 days

## 2022-10-09 NOTE — ED Provider Notes (Signed)
[place] EMERGENCY DEPARTMENT AT [facility] Provider Note   CSN: [id number] Arrival date & time: 10/09/22  0530     History  Chief Complaint  Patient presents with   Hand Pain   Foot Pain    Blake Chambers is a 32 y.o. male.   Hand Pain  Foot Pain   32 year old male presents emergency department with complaints of pain in fingers and toes.  Patient reports symptoms occurring last night when he was sleeping outside in the cold.  States he has a history of similar symptoms in the past.  States that symptoms do seem to be related to sleeping outside:.  Patient reports homelessness with no desire to live in shelter.  Denies any trauma/injury to hands or feet.  Denies any weakness or sensory deficits.  Taken no medication for this.  Past medical history significant for HIV, mental health disorder, syphilis, bipolar disorder, cocaine use, cannabis use, frostbite  Home Medications Prior to Admission medications   Medication Sig Start Date End Date Taking? Authorizing Provider  acetaminophen (TYLENOL 8 HOUR) 650 MG CR tablet Take 1 tablet (650 mg total) by mouth every 8 (eight) hours as needed for pain. 04/18/22   Mannie Stabile, PA-C  bictegravir-emtricitabine-tenofovir AF (BIKTARVY) 50-200-25 MG TABS tablet Take 1 tablet by mouth daily. Patient not taking: Reported on 05/20/2022 12/21/21   Mare Ferrari, PA-C  cyclobenzaprine (FLEXERIL) 10 MG tablet Take 1 tablet (10 mg total) by mouth 2 (two) times daily as needed for muscle spasms. 09/26/22   Carroll Sage, PA-C  ibuprofen (ADVIL) 600 MG tablet Take 1 tablet (600 mg total) by mouth every 6 (six) hours as needed. Patient taking differently: Take 200-600 mg by mouth every 6 (six) hours as needed for moderate pain. 06/30/21   Gwyneth Sprout, MD      Allergies    Tenofovir disoproxil and Kiwi extract    Review of Systems   Review of Systems  All other systems reviewed and are negative.   Physical  Exam Updated Vital Signs BP 128/82 (BP Location: Right Arm)   Pulse 79   Temp 98 F (36.7 C) (Oral)   Resp 14   Ht  (1.753 m)   Wt 68.9 kg   SpO2 100%   BMI 22.45 kg/m  Physical Exam Vitals and nursing note reviewed.  Constitutional:      General: He is not in acute distress.    Appearance: He is well-developed.  HENT:     Head: Normocephalic and atraumatic.  Eyes:     Conjunctiva/sclera: Conjunctivae normal.  Cardiovascular:     Rate and Rhythm: Normal rate and regular rhythm.     Heart sounds: No murmur heard. Pulmonary:     Effort: Pulmonary effort is normal. No respiratory distress.     Breath sounds: Normal breath sounds.  Abdominal:     Palpations: Abdomen is soft.     Tenderness: There is no abdominal tenderness.  Musculoskeletal:        General: No swelling.     Cervical back: Neck supple.     Comments: Patient with full range of motion of bilateral shoulders, elbows, wrists, digits, hips, knees, ankles, digits.  Pedal and radial pulses 2+ bilaterally.  Fingers and toes cool to the touch.  Multiple partially potation's appreciated at bilateral upper extremity digits.  No obvious erythema, palpable fluctuance, induration.  No appreciable rash or drainage.  No sensory deficits distally.  Muscular strength 5 out of 5  bilateral upper and lower extremities.  Skin:    General: Skin is warm and dry.     Capillary Refill: Capillary refill takes less than 2 seconds.  Neurological:     Mental Status: He is alert.  Psychiatric:        Mood and Affect: Mood normal.     ED Results / Procedures / Treatments   Labs (all labs ordered are listed, but only abnormal results are displayed) Labs Reviewed - No data to display  EKG None  Radiology No results found.  Procedures Procedures    Medications Ordered in ED Medications  acetaminophen (TYLENOL) tablet 650 mg (650 mg Oral Given 10/09/22 0541)    ED Course/ Medical Decision Making/ A&P                              Medical Decision Making  This patient presents to the ED for concern of finger and toe pain, this involves an extensive number of treatment options, and is a complaint that carries with it a high risk of complications and morbidity.  The differential diagnosis includes fracture, dislocation, strain/sprain, neurovascular compromise, frostbite, ischemic limb   Co morbidities that complicate the patient evaluation  See HPI   Additional history obtained:  Additional history obtained from EMR External records from outside source obtained and reviewed including hospital records   Lab Tests:  N/a   Imaging Studies ordered:  N/a    Cardiac Monitoring: / EKG:  The patient was maintained on a cardiac monitor.  I personally viewed and interpreted the cardiac monitored which showed an underlying rhythm of: Sinus rhythm   Consultations Obtained:  N/a   Problem List / ED Course / Critical interventions / Medication management  Finger and toe pain I ordered medication including Tylenol   Reevaluation of the patient after these medicines showed that the patient improved I have reviewed the patients home medicines and have made adjustments as needed   Social Determinants of Health:  Some cigarette use.  Denies illicit drug use.   Test / Admission - Considered:  Finger and toe pain Vitals signs within normal range and stable throughout visit. 32 year old male presents emergency department complaints of bilateral finger and toe pain.  No clinical evidence of acute fracture or dislocation.  No overlying skin abnormalities indicative of secondary infectious process.  Patient's symptoms seem to be improving with heat pack placed while in the emergency department.  Likely secondary to cold exposure from sleeping outside of night.  No evidence of hospitalized currently.  Patient recommended follow-up with primary care for reassessment as well as Tylenol/Motrin as needed for  pain/discomfort.  Patient given shelter information upon discharge for safe living environment.  Treatment plan discussed at length with patient and he acknowledged understanding was agreeable to said plan.  Further workup deemed unnecessary at this time while in the ED. Worrisome signs and symptoms were discussed with the patient, and the patient acknowledged understanding to return to the ED if noticed. Patient was stable upon discharge.          Final Clinical Impression(s) / ED Diagnoses Final diagnoses:  Pain in both hands  Pain in toes of both feet    Rx / DC Orders ED Discharge Orders     None         Peter Garter, [place] 10/09/22 0555    Melene Plan, DO 10/09/22 0602

## 2022-10-12 ENCOUNTER — Encounter (HOSPITAL_COMMUNITY): Payer: Self-pay

## 2022-10-12 ENCOUNTER — Emergency Department (HOSPITAL_COMMUNITY)
Admission: EM | Admit: 2022-10-12 | Discharge: 2022-10-12 | Disposition: A | Discharge status: Home / Self Care | Payer: No Typology Code available for payment source | Attending: Emergency Medicine | Admitting: Emergency Medicine

## 2022-10-12 DIAGNOSIS — M79641 Pain in right hand: Secondary | ICD-10-CM | POA: Insufficient documentation

## 2022-10-12 DIAGNOSIS — Z21 Asymptomatic human immunodeficiency virus [HIV] infection status: Secondary | ICD-10-CM | POA: Insufficient documentation

## 2022-10-12 DIAGNOSIS — M79642 Pain in left hand: Secondary | ICD-10-CM | POA: Insufficient documentation

## 2022-10-12 HISTORY — DX: Malingerer (conscious simulation): Z76.5

## 2022-10-12 MED ORDER — IBUPROFEN 800 MG PO TABS
800.0000 mg | ORAL_TABLET | Freq: Once | ORAL | Status: AC
  Administered 2022-10-12: 800 mg via ORAL
  Filled 2022-10-12: qty 1

## 2022-10-12 NOTE — ED Notes (Signed)
Bus pass provided upon discharge.

## 2022-10-12 NOTE — ED Triage Notes (Signed)
Pt BIB [facility] from [facility] for left hand pain. Pt has been seen recently for same twice this week. Pt reported "forgot what they told me". Pt is also homeless and malingering.

## 2022-10-12 NOTE — ED Provider Notes (Signed)
[place] EMERGENCY DEPARTMENT AT [facility] Provider Note   CSN: [id number] Arrival date & time: 10/12/22  4098     History  Chief Complaint  Patient presents with   Hand Pain    Blake Chambers is a 32 y.o. male.  32 year old male brought in by EMS with complaint of left hand pain.  Currently orts bilateral hand pain, no injuries.  Past medical history of anxiety, HIV, bipolar disorder, syphilis, osteomyelitis of finger left hand, malingering.       Home Medications Prior to Admission medications   Medication Sig Start Date End Date Taking? Authorizing Provider  acetaminophen (TYLENOL 8 HOUR) 650 MG CR tablet Take 1 tablet (650 mg total) by mouth every 8 (eight) hours as needed for pain. 04/18/22   Mannie Stabile, PA-C  bictegravir-emtricitabine-tenofovir AF (BIKTARVY) 50-200-25 MG TABS tablet Take 1 tablet by mouth daily. Patient not taking: Reported on 05/20/2022 12/21/21   Mare Ferrari, PA-C  cyclobenzaprine (FLEXERIL) 10 MG tablet Take 1 tablet (10 mg total) by mouth 2 (two) times daily as needed for muscle spasms. 09/26/22   Carroll Sage, PA-C  ibuprofen (ADVIL) 600 MG tablet Take 1 tablet (600 mg total) by mouth every 6 (six) hours as needed. Patient taking differently: Take 200-600 mg by mouth every 6 (six) hours as needed for moderate pain. 06/30/21   Gwyneth Sprout, MD      Allergies    Tenofovir disoproxil and Kiwi extract    Review of Systems   Review of Systems Negative except as per HPI Physical Exam Updated Vital Signs BP (!) 140/85 (BP Location: Right Arm)   Pulse 84   Temp 97.8 F (36.6 C) (Oral)   Resp 18   Ht 5\' 9"  (1.753 m)   Wt 68.9 kg   SpO2 95%   BMI 22.45 kg/m  Physical Exam Vitals and nursing note reviewed.  Constitutional:      General: He is not in acute distress.    Appearance: He is well-developed. He is not diaphoretic.     Comments: Sleeping, arouses with verbal stimuli  HENT:     Head: Normocephalic  and atraumatic.  Cardiovascular:     Pulses: Normal pulses.  Pulmonary:     Effort: Pulmonary effort is normal.  Musculoskeletal:        General: No swelling or tenderness.     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     Findings: No erythema or rash.  Neurological:     Mental Status: He is oriented to person, place, and time.  Psychiatric:        Behavior: Behavior normal.     ED Results / Procedures / Treatments   Labs (all labs ordered are listed, but only abnormal results are displayed) Labs Reviewed - No data to display  EKG None  Radiology No results found.  Procedures Procedures    Medications Ordered in ED Medications  ibuprofen (ADVIL) tablet 800 mg (has no administration in time range)    ED Course/ Medical Decision Making/ A&P                             Medical Decision Making Risk Prescription drug management.   32 year old male brought in by EMS for left hand pain. Sleeping, rouses to verbal stimuli and participates minimally in history of presentation today. Seen here  recently for same. No reports of injury, states both hands hurt. 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 Provided with Motrin for pain. Recommend follow up with PCP.         Final Clinical Impression(s) / ED Diagnoses Final diagnoses:  Right hand pain  Left hand pain    Rx / DC Orders ED Discharge Orders     None         Alden Hipp 10/12/22 0622    Nira Conn, MD 10/12/22 [phone]

## 2022-10-21 ENCOUNTER — Ambulatory Visit
Admission: EM | Admit: 2022-10-21 | Discharge: 2022-10-21 | Disposition: A | Discharge status: Home / Self Care | Payer: No Typology Code available for payment source | Attending: Urgent Care | Admitting: Urgent Care

## 2022-10-21 DIAGNOSIS — L089 Local infection of the skin and subcutaneous tissue, unspecified: Secondary | ICD-10-CM

## 2022-10-21 DIAGNOSIS — B353 Tinea pedis: Secondary | ICD-10-CM

## 2022-10-21 DIAGNOSIS — B20 Human immunodeficiency virus [HIV] disease: Secondary | ICD-10-CM

## 2022-10-21 MED ORDER — KETOCONAZOLE 2 % EX CREA: TOPICAL_CREAM | CUTANEOUS | 0 refills | Status: DC

## 2022-10-21 MED ORDER — CEPHALEXIN 500 MG PO CAPS: 500.0000 mg | ORAL_CAPSULE | Freq: Three times a day (TID) | ORAL | 0 refills | Status: DC
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
## 2022-10-21 NOTE — ED Triage Notes (Signed)
Pt reports bilateral foot pain x 6 months, states he walks a lot. Antifungal spray and cream gives no relief.    Pt reports he has anxiety, depression, he has " a lot of regrets", he wish he "could do better: Denies he wants to harm himself. States sometimes he gets angry with people that works at [employer] because talk about him.

## 2022-10-21 NOTE — Discharge Instructions (Addendum)
Start ketoconazole cream once daily for 2 weeks. This can address a fungal infection of the feet. I also think that you have a secondary bacterial skin infection and need you start cephalexin 3 times daily for 1 week. Make sure you keep your feet as dry as you can.

## 2022-10-22 ENCOUNTER — Other Ambulatory Visit: Payer: Self-pay

## 2022-10-22 ENCOUNTER — Emergency Department (HOSPITAL_COMMUNITY)
Admission: EM | Admit: 2022-10-22 | Discharge: 2022-10-22 | Disposition: A | Discharge status: Home / Self Care | Payer: No Typology Code available for payment source | Attending: Emergency Medicine | Admitting: Emergency Medicine

## 2022-10-22 DIAGNOSIS — M79671 Pain in right foot: Secondary | ICD-10-CM | POA: Insufficient documentation

## 2022-10-22 DIAGNOSIS — M79672 Pain in left foot: Secondary | ICD-10-CM | POA: Diagnosis present

## 2022-10-22 DIAGNOSIS — Z21 Asymptomatic human immunodeficiency virus [HIV] infection status: Secondary | ICD-10-CM | POA: Diagnosis not present

## 2022-10-22 NOTE — ED Notes (Signed)
Pt axox4. Bus pass provided.

## 2022-10-22 NOTE — Discharge Instructions (Signed)
Tylenol or ibuprofen for foot pain
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
## 2022-10-22 NOTE — [facility] Notes (Signed)
Visited with patient who had return due to pain in foot.  Chaplain provided presence and emotional support. Chaplain available as needed.  Venida Jarvis, [place], [facility], Pager [phone]

## 2022-10-26 ENCOUNTER — Ambulatory Visit
Admission: EM | Admit: 2022-10-26 | Discharge: 2022-10-26 | Disposition: A | Discharge status: Home / Self Care | Payer: Medicaid Other | Attending: Family Medicine | Admitting: Family Medicine

## 2022-10-26 DIAGNOSIS — F32A Depression, unspecified: Secondary | ICD-10-CM

## 2022-10-26 DIAGNOSIS — R739 Hyperglycemia, unspecified: Secondary | ICD-10-CM

## 2022-10-26 DIAGNOSIS — F419 Anxiety disorder, unspecified: Secondary | ICD-10-CM

## 2022-10-26 NOTE — ED Triage Notes (Signed)
Pt reports he needs to pause when he is talking and is bothering him x 1-2 months. Pt reports he is dealing with anxiety, depression, over thinking.   Pt states he wants to start taking his meds for anxiety and depression.  Reports he will start working on 10/28/22 on a part time job.

## 2022-10-26 NOTE — Discharge Instructions (Addendum)
Please go to the behavioral health urgent care on [address] to get started on your psychiatric treatment and referral.  Please call infectious diseases and get an appointment with them  You can use the QR code/website at the back of the summary paperwork to schedule yourself a new patient appointment with primary care  Most your sugars in our chart have been normal.  Occasionally they have been mildly elevated.  For the short-term, just limit his weeks and sugary drinks.

## 2022-10-26 NOTE — ED Provider Notes (Addendum)
[facility]    CSN: [id number] Arrival date & time: 10/26/22  1452      History   Chief Complaint No chief complaint on file.   HPI Blake Chambers is a 32 y.o. male.   HPI Here for trouble with attention and anxiety and depression.  He is not having any suicidal thoughts or homicidal thoughts.  He states he is ready to get back on his medicines, which included Abilify.  He also was not taking his Biktarvy for his HIV disease.  He last saw infectious disease specialist in 2023.  He states he has been told he has diabetes.  On review of his chart I see mostly normal sugars in the 80s to 90s range, with an occasional 140s or 150s.     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   Pain of left forearm    Finger osteomyelitis, left (HCC) 09/11/2020   Family discord 08/13/2020   Perirectal cellulitis 11/01/2017   Abscess 08/20/2017   Perirectal abscess 08/20/2017   Encounter for long-term (current) use of high-risk medication 02/24/2017   Screening examination for venereal disease 10/21/2016   Cocaine use disorder, moderate, dependence (HCC) 10/23/2014   Cannabis use disorder, severe, dependence (HCC) 10/23/2014   Bipolar I disorder, severe, current or most recent episode depressed, with mixed features (HCC) 10/21/2014   Frostbite of both hands 08/01/2014   Tobacco use disorder 07/18/2013   Chronic renal insufficiency, stage II (mild) 11/30/2012   HIV disease (HCC) 02/13/2011   Syphilis 02/13/2011   Bipolar I  disorder (HCC) 02/10/2011    Past Surgical History:  Procedure Laterality Date   IRRIGATION AND DEBRIDEMENT ABSCESS N/A 08/20/2017   Procedure: IRRIGATION AND DEBRIDEMENT PERI RECTAL ABSCESS;  Surgeon: Andria Meuse, MD;  Location: [facility] OR;  Service: General;  Laterality: N/A;       Home Medications    Prior to Admission medications   Medication Sig Start Date End Date Taking? Authorizing Provider  acetaminophen (TYLENOL 8 HOUR) 650 MG CR tablet Take 1 tablet (650 mg total) by mouth every 8 (eight) hours as needed for pain. 04/18/22   Mannie Stabile, PA-C  bictegravir-emtricitabine-tenofovir AF (BIKTARVY) 50-200-25 MG TABS tablet Take 1 tablet by mouth daily. Patient not taking: Reported on 05/20/2022 12/21/21   Mare Ferrari, PA-C  cephALEXin (KEFLEX) 500 MG capsule Take 1 capsule (500 mg total) by mouth 3 (three) times daily. 10/21/22   Wallis Bamberg, PA-C  cyclobenzaprine (FLEXERIL) 10 MG tablet Take 1 tablet (10 mg total) by mouth 2 (two) times daily as needed for muscle spasms. 09/26/22   Carroll Sage, PA-C  ibuprofen (ADVIL) 600 MG tablet Take 1 tablet (600 mg total) by mouth every 6 (six) hours as needed. Patient taking differently: Take 200-600 mg by mouth every 6 (six) hours as needed for moderate pain. 06/30/21   Gwyneth Sprout, MD  ketoconazole (NIZORAL) 2 % cream Apply once daily to both feet for 2 weeks. 10/21/22  Wallis Bamberg, PA-C    Family History Family History  Family history unknown: Yes    Social History Social History   Tobacco Use   Smoking status: Some Days    Packs/day: .3    Types: Cigarettes   Smokeless tobacco: Never   Tobacco comments:    pt. trying to quit  Vaping Use   Vaping Use: Never used  Substance Use Topics   Alcohol use: Not Currently    Alcohol/week: 1.0 standard drink of alcohol    Types: 1 Standard drinks or equivalent per week   Drug use: Not Currently    Frequency: 1.0 times per week    Types: Marijuana, Cocaine,  Methamphetamines    Comment: pt reports not using any substance for the past month     Allergies   Tenofovir disoproxil and Kiwi extract   Review of Systems Review of Systems   Physical Exam Triage Vital Signs ED Triage Vitals [10/26/22 1503]  Enc Vitals Group     BP 126/82     Pulse Rate 96     Resp 18     Temp 98.2 F (36.8 C)     Temp Source Oral     SpO2 97 %     Weight      Height      Head Circumference      Peak Flow      Pain Score      Pain Loc      Pain Edu?      Excl. in GC?    No data found.  Updated Vital Signs BP 126/82 (BP Location: Right Arm)   Pulse 96   Temp 98.2 F (36.8 C) (Oral)   Resp 18   SpO2 97%   Visual Acuity Right Eye Distance:   Left Eye Distance:   Bilateral Distance:    Right Eye Near:   Left Eye Near:    Bilateral Near:     Physical Exam Vitals reviewed.  Constitutional:      General: He is not in acute distress.    Appearance: He is not ill-appearing, toxic-appearing or diaphoretic.  Skin:    Coloration: Skin is not jaundiced or pale.  Neurological:     Mental Status: He is alert and oriented to person, place, and time.  Psychiatric:        Behavior: Behavior normal.      UC Treatments / Results  Labs (all labs ordered are listed, but only abnormal results are displayed) Labs Reviewed - No data to display  EKG   Radiology No results found.  Procedures Procedures (including critical care time)  Medications Ordered in UC Medications - No data to display  Initial Impression / Assessment and Plan / UC Course  I have reviewed the triage vital signs and the nursing notes.  Pertinent labs & imaging results that were available during my care of the patient were reviewed by me and considered in my medical decision making (see chart for details).     He is in no distress here and is calm and overall coherent and his speech. He is given a flyer with information about the behavioral health urgent care, so  that he can go there and get started on his psychiatric referrals and possibly on medication at that point.  I have asked him to schedule a new appointment with his infectious disease specialist  He is given information on self scheduling a new patient appointment, and assistance is requested to  help him find a primary care   Final Clinical Impressions(s) / UC Diagnoses   Final diagnoses:  Anxiety and depression  Elevated blood sugar     Discharge Instructions      Please go to the behavioral health urgent care on [address] to get started on your psychiatric treatment and referral.  Please call infectious diseases and get an appointment with them  You can use the QR code/website at the back of the summary paperwork to schedule yourself a new patient appointment with primary care  Most your sugars in our chart have been normal.  Occasionally they have been mildly elevated.  For the short-term, just limit his weeks and sugary drinks.     ED Prescriptions   None    PDMP not reviewed this encounter.   Zenia Resides, MD 10/26/22 1529    Zenia Resides, MD 10/26/22 [phone]

## 2022-10-27 ENCOUNTER — Emergency Department (HOSPITAL_COMMUNITY)
Admission: EM | Admit: 2022-10-27 | Discharge: 2022-10-27 | Disposition: A | Discharge status: Home / Self Care | Payer: No Typology Code available for payment source | Attending: Emergency Medicine | Admitting: Emergency Medicine

## 2022-10-27 DIAGNOSIS — M79672 Pain in left foot: Secondary | ICD-10-CM | POA: Insufficient documentation

## 2022-10-27 DIAGNOSIS — M79642 Pain in left hand: Secondary | ICD-10-CM | POA: Insufficient documentation

## 2022-10-27 DIAGNOSIS — Z760 Encounter for issue of repeat prescription: Secondary | ICD-10-CM

## 2022-10-27 DIAGNOSIS — M79641 Pain in right hand: Secondary | ICD-10-CM | POA: Diagnosis present

## 2022-10-27 DIAGNOSIS — M79671 Pain in right foot: Secondary | ICD-10-CM | POA: Diagnosis not present

## 2022-10-27 MED ORDER — BICTEGRAVIR-EMTRICITAB-TENOFOV 50-200-25 MG PO TABS: 1.0000 | ORAL_TABLET | Freq: Every day | ORAL | 11 refills | Status: DC

## 2022-10-27 NOTE — Discharge Instructions (Signed)
Take your medications as prescribed.  Follow up with Dr. Luciana Axe.

## 2022-10-27 NOTE — ED Provider Notes (Signed)
[place] EMERGENCY DEPARTMENT AT [facility] Provider Note   CSN: [id number] Arrival date & time: 10/27/22  0516     History  Chief Complaint  Patient presents with   Foot Pain    Blake Chambers is a 32 y.o. male.   Foot Pain  Pt with BL foot pain.  Also indicates BL hand pain.  He comes to the ER frequently for foot and hand pain.   No trauma. No fevers. Hands have been cold overnights.       Home Medications Prior to Admission medications   Medication Sig Start Date End Date Taking? Authorizing Provider  acetaminophen (TYLENOL 8 HOUR) 650 MG CR tablet Take 1 tablet (650 mg total) by mouth every 8 (eight) hours as needed for pain. 04/18/22   Mannie Stabile, PA-C  bictegravir-emtricitabine-tenofovir AF (BIKTARVY) 50-200-25 MG TABS tablet Take 1 tablet by mouth daily. 10/27/22   Gailen Shelter, PA  cephALEXin (KEFLEX) 500 MG capsule Take 1 capsule (500 mg total) by mouth 3 (three) times daily. 10/21/22   Wallis Bamberg, PA-C  cyclobenzaprine (FLEXERIL) 10 MG tablet Take 1 tablet (10 mg total) by mouth 2 (two) times daily as needed for muscle spasms. 09/26/22   Carroll Sage, PA-C  ketoconazole (NIZORAL) 2 % cream Apply once daily to both feet for 2 weeks. 10/21/22   Wallis Bamberg, PA-C      Allergies    Tenofovir disoproxil and Kiwi extract    Review of Systems   Review of Systems  Physical Exam Updated Vital Signs BP 136/85 (BP Location: Right Arm)   Pulse 95   Temp 97.9 F (36.6 C) (Oral)   Resp 18   SpO2 98%  Physical Exam Vitals and nursing note reviewed.  Constitutional:      General: He is not in acute distress.    Appearance: Normal appearance. He is not ill-appearing.  HENT:     Head: Normocephalic and atraumatic.  Eyes:     General: No scleral icterus.       Right eye: No discharge.        Left eye: No discharge.     Conjunctiva/sclera: Conjunctivae normal.  Pulmonary:     Effort: Pulmonary effort is normal.     Breath sounds:  No stridor.  Skin:    General: Skin is warm and dry.     Comments: Bilateral hands with fingernail clubbing, cap refill less than 2 is available, sensation intact fingers full range of motion with some deformities of fingers that appear chronic.  Bilateral feet without lesions.  Do not appreciate any cellulitic changes  Neurological:     Mental Status: He is alert and oriented to person, place, and time. Mental status is at baseline.     ED Results / Procedures / Treatments   Labs (all labs ordered are listed, but only abnormal results are displayed) Labs Reviewed - No data to display  EKG None  Radiology No results found.  Procedures Procedures    Medications Ordered in ED Medications - No data to display  ED Course/ Medical Decision Making/ A&P                             Medical Decision Making Risk Prescription drug management.   Pt with BL foot pain.  Also indicates BL hand pain.  He comes to the ER frequently for foot and hand pain.   No trauma. No fevers.  Hands have been cold overnights.   Was prescribed antifungal/keflex for tinea but I do not appreciate any evidence of this.   Refilled pt's Biktarvy as requested by pt. All Qs answered. Pt has no additional requests. Very pleasant. Return precautions discussed.   Final Clinical Impression(s) / ED Diagnoses Final diagnoses:  Bilateral hand pain  Medication refill    Rx / DC Orders ED Discharge Orders          Ordered    bictegravir-emtricitabine-tenofovir AF (BIKTARVY) 50-200-25 MG TABS tablet  Daily        10/27/22 0559              Gailen Shelter, [place] 10/27/22 0701    Nira Conn, MD 10/28/22 1143
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
## 2022-10-28 ENCOUNTER — Emergency Department (HOSPITAL_COMMUNITY)
Admission: EM | Admit: 2022-10-28 | Discharge: 2022-10-28 | Disposition: A | Discharge status: Home / Self Care | Payer: No Typology Code available for payment source | Attending: Emergency Medicine | Admitting: Emergency Medicine

## 2022-10-28 ENCOUNTER — Other Ambulatory Visit: Payer: Self-pay

## 2022-10-28 DIAGNOSIS — R464 Slowness and poor responsiveness: Secondary | ICD-10-CM | POA: Insufficient documentation

## 2022-10-28 DIAGNOSIS — E162 Hypoglycemia, unspecified: Secondary | ICD-10-CM | POA: Insufficient documentation

## 2022-10-28 DIAGNOSIS — R55 Syncope and collapse: Secondary | ICD-10-CM | POA: Diagnosis not present

## 2022-10-28 DIAGNOSIS — Z21 Asymptomatic human immunodeficiency virus [HIV] infection status: Secondary | ICD-10-CM | POA: Diagnosis not present

## 2022-10-28 LAB — CBC WITH DIFFERENTIAL/PLATELET
Abs Immature Granulocytes: 0.03 10*3/uL (ref 0.00–0.07)
Basophils Absolute: 0 10*3/uL (ref 0.0–0.1)
Basophils Relative: 0 %
Eosinophils Absolute: 0 10*3/uL (ref 0.0–0.5)
Eosinophils Relative: 1 %
HCT: 41.7 % (ref 39.0–52.0)
Hemoglobin: 13.4 g/dL (ref 13.0–17.0)
Immature Granulocytes: 1 %
Lymphocytes Relative: 31 %
Lymphs Abs: 1.8 10*3/uL (ref 0.7–4.0)
MCH: 25.7 pg — ABNORMAL LOW (ref 26.0–34.0)
MCHC: 32.1 g/dL (ref 30.0–36.0)
MCV: 80 fL (ref 80.0–100.0)
Monocytes Absolute: 0.5 10*3/uL (ref 0.1–1.0)
Monocytes Relative: 9 %
Neutro Abs: 3.6 10*3/uL (ref 1.7–7.7)
Neutrophils Relative %: 58 %
Platelets: 299 10*3/uL (ref 150–400)
RBC: 5.21 MIL/uL (ref 4.22–5.81)
RDW: 15.7 % — ABNORMAL HIGH (ref 11.5–15.5)
WBC: 6 10*3/uL (ref 4.0–10.5)
nRBC: 0 % (ref 0.0–0.2)

## 2022-10-28 LAB — COMPREHENSIVE METABOLIC PANEL
ALT: 45 U/L — ABNORMAL HIGH (ref 0–44)
AST: 55 U/L — ABNORMAL HIGH (ref 15–41)
Albumin: 3.2 g/dL — ABNORMAL LOW (ref 3.5–5.0)
Alkaline Phosphatase: 67 U/L (ref 38–126)
Anion gap: 6 (ref 5–15)
BUN: 16 mg/dL (ref 6–20)
CO2: 23 mmol/L (ref 22–32)
Calcium: 8.3 mg/dL — ABNORMAL LOW (ref 8.9–10.3)
Chloride: 101 mmol/L (ref 98–111)
Creatinine, Ser: 1.05 mg/dL (ref 0.61–1.24)
GFR, Estimated: >60 mL/min (ref >60)
Glucose, Bld: 178 mg/dL — ABNORMAL HIGH (ref 70–99)
Potassium: 3.5 mmol/L (ref 3.5–5.1)
Sodium: 130 mmol/L — ABNORMAL LOW (ref 135–145)
Total Bilirubin: 0.8 mg/dL (ref 0.3–1.2)
Total Protein: 8.9 g/dL — ABNORMAL HIGH (ref 6.5–8.1)

## 2022-10-28 LAB — CBG MONITORING, ED
Glucose-Capillary: 127 mg/dL — ABNORMAL HIGH (ref 70–99)
Glucose-Capillary: 85 mg/dL (ref 70–99)

## 2022-10-28 MED ORDER — SODIUM CHLORIDE 0.9 % IV BOLUS
1000.0000 mL | Freq: Once | INTRAVENOUS | Status: AC
  Administered 2022-10-28: 1000 mL via INTRAVENOUS

## 2022-10-28 NOTE — Discharge Instructions (Signed)
Your sugar was low today.  Please stay hydrated and eat normally.  Follow-up with your doctor  Return to ER if you have another episode of passing out, chest pain, shortness of breath

## 2022-10-28 NOTE — ED Provider Notes (Signed)
[place] EMERGENCY DEPARTMENT AT [facility] Provider Note   CSN: [id number] Arrival date & time: 10/28/22  1647     History  Chief Complaint  Patient presents with   Hypoglycemia    Khamron Hurry is a 32 y.o. male history of HIV and compliant with meds, here presenting with possible syncope.  Patient was found at [facility] and was found to be unresponsive.  When EMS got there his sugar was 60 and he began eating food and his sugar went up to 78.  He states that he was stressed out today and may have passed out from there.  Denies any chest pain or any head injury.  Patient was seen in the ED multiple times for various symptoms.  He was most recently seen yesterday for foot pain.   The history is provided by the patient.       Home Medications Prior to Admission medications   Medication Sig Start Date End Date Taking? Authorizing Provider  acetaminophen (TYLENOL 8 HOUR) 650 MG CR tablet Take 1 tablet (650 mg total) by mouth every 8 (eight) hours as needed for pain. 04/18/22   Mannie Stabile, PA-C  bictegravir-emtricitabine-tenofovir AF (BIKTARVY) 50-200-25 MG TABS tablet Take 1 tablet by mouth daily. 10/27/22   Gailen Shelter, PA  cephALEXin (KEFLEX) 500 MG capsule Take 1 capsule (500 mg total) by mouth 3 (three) times daily. 10/21/22   Wallis Bamberg, PA-C  cyclobenzaprine (FLEXERIL) 10 MG tablet Take 1 tablet (10 mg total) by mouth 2 (two) times daily as needed for muscle spasms. 09/26/22   Carroll Sage, PA-C  ketoconazole (NIZORAL) 2 % cream Apply once daily to both feet for 2 weeks. 10/21/22   Wallis Bamberg, PA-C      Allergies    Tenofovir disoproxil and Kiwi extract    Review of Systems   Review of Systems  Neurological:  Positive for syncope.  All other systems reviewed and are negative.   Physical Exam Updated Vital Signs There were no vitals taken for this visit. Physical Exam Vitals and nursing note reviewed.  Constitutional:      Comments: Awake  and alert  HENT:     Head: Normocephalic.     Comments: No signs of head trauma    Nose: Nose normal.     Mouth/Throat:     Mouth: Mucous membranes are dry.  Eyes:     Extraocular Movements: Extraocular movements intact.     Pupils: Pupils are equal, round, and reactive to light.  Cardiovascular:     Rate and Rhythm: Normal rate and regular rhythm.     Pulses: Normal pulses.     Heart sounds: Normal heart sounds.  Pulmonary:     Effort: Pulmonary effort is normal.     Breath sounds: Normal breath sounds.  Abdominal:     General: Abdomen is flat.     Palpations: Abdomen is soft.  Musculoskeletal:        General: Normal range of motion.     Cervical back: Normal range of motion and neck supple.  Skin:    General: Skin is warm.     Capillary Refill: Capillary refill takes less than 2 seconds.  Neurological:     General: No focal deficit present.     Mental Status: He is alert and oriented to person, place, and time.  Psychiatric:        Mood and Affect: Mood normal.        Behavior: Behavior  normal.     ED Results / Procedures / Treatments   Labs (all labs ordered are listed, but only abnormal results are displayed) Labs Reviewed  CBC WITH DIFFERENTIAL/PLATELET  COMPREHENSIVE METABOLIC PANEL    EKG None  Radiology No results found.  Procedures Procedures    Medications Ordered in ED Medications  sodium chloride 0.9 % bolus 1,000 mL (has no administration in time range)    ED Course/ Medical Decision Making/ A&P                             Medical Decision Making Justyce Merfeld is a 32 y.o. male here presenting with possible hypoglycemia and altered mental status.  Patient was apparently hypoglycemic and then ate some food and most recent blood sugar was 78.  I think the syncopal episode likely from hypoglycemia.  Patient has no head injury.  Plan to get CBC and CMP and EKG and hydrate patient and reassess  6:25 PM Reviewed patient's labs and glucose was  178.  Patient's glucose remained stable several hours later at 85.  Patient is eating and drinking and ANO x 3.  I told him to stay hydrated.  Stable for discharge.  Problems Addressed: Hypoglycemia: acute illness or injury  Amount and/or Complexity of Data Reviewed Labs: ordered. Decision-making details documented in ED Course. ECG/medicine tests: ordered and independent interpretation performed. Decision-making details documented in ED Course.    Final Clinical Impression(s) / ED Diagnoses Final diagnoses:  None    Rx / DC Orders ED Discharge Orders     None         Charlynne Pander, MD 10/28/22 1825

## 2022-10-28 NOTE — ED Triage Notes (Signed)
[facility] was called out for "unresponsive pt" cbg 64 per ems "he was eating a snickers" before being transported to hospital.

## 2022-10-30 ENCOUNTER — Other Ambulatory Visit: Payer: Self-pay

## 2022-10-30 ENCOUNTER — Encounter (HOSPITAL_COMMUNITY): Payer: Self-pay | Admitting: *Deleted

## 2022-10-30 ENCOUNTER — Emergency Department (HOSPITAL_COMMUNITY)
Admission: EM | Admit: 2022-10-30 | Discharge: 2022-10-30 | Disposition: A | Discharge status: Home / Self Care | Payer: No Typology Code available for payment source | Attending: Emergency Medicine | Admitting: Emergency Medicine

## 2022-10-30 ENCOUNTER — Encounter (HOSPITAL_COMMUNITY): Payer: Self-pay

## 2022-10-30 DIAGNOSIS — M79672 Pain in left foot: Secondary | ICD-10-CM | POA: Insufficient documentation

## 2022-10-30 DIAGNOSIS — Z5900 Homelessness unspecified: Secondary | ICD-10-CM | POA: Diagnosis not present

## 2022-10-30 DIAGNOSIS — M79671 Pain in right foot: Secondary | ICD-10-CM | POA: Insufficient documentation

## 2022-10-30 LAB — CBG MONITORING, ED: Glucose-Capillary: 98 mg/dL (ref 70–99)

## 2022-10-30 MED ORDER — ACETAMINOPHEN 325 MG PO TABS
650.0000 mg | ORAL_TABLET | Freq: Once | ORAL | Status: AC
  Administered 2022-10-30: 650 mg via ORAL
  Filled 2022-10-30: qty 2

## 2022-10-30 NOTE — ED Provider Triage Note (Signed)
Emergency Medicine Provider Triage Evaluation Note  Blake Chambers , a 32 y.o. male  was evaluated in triage.  Pt complains of bilateral foot pain.  Patient states that symptoms been present for the past few months.  Denies any new falls/traumas.  Denies fever, chills, night sweats, chest pain, shortness of breath, abdominal pain, nausea, vomiting..  Review of Systems  Positive: See above Negative:   Physical Exam  BP (!) 128/92 (BP Location: Right Arm)   Pulse (!) 122   Temp 97.7 F (36.5 C) (Oral)   Resp 18   SpO2 95%  Gen:   Awake, no distress   Resp:  Normal effort  MSK:   Moves extremities without difficulty  Other:    Medical Decision Making  Medically screening exam initiated at 2:13 PM.  Appropriate orders placed.  Blake Chambers was informed that the remainder of the evaluation will be completed by another provider, this initial triage assessment does not replace that evaluation, and the importance of remaining in the ED until their evaluation is complete.     Blake Chambers, [place] 10/30/22 1414

## 2022-10-30 NOTE — Discharge Instructions (Signed)
You were seen today for bilateral foot pain.  This is likely due to how much you are walking.  We recommend you try to stay off your feet as much as you can and keep them elevated when not walking.  If you develop skin changes or severe pain you can return to the ED.  You should follow-up with your primary care provider.

## 2022-10-30 NOTE — ED Provider Notes (Signed)
[place] EMERGENCY DEPARTMENT AT [facility] Provider Note   CSN: [id number] Arrival date & time: 10/30/22  1336     History  Chief Complaint  Patient presents with   Foot Pain    Blake Chambers is a 32 y.o. male. 32 year old male history of HIV presenting for bilateral foot pain.  Patient states for months has had pain to the bottom of his bilateral feet.  Has not had any trauma.  Pain is diffuse over the bottom of his feet worse with walking.  He walks frequently as he is currently homeless.  He is trying to get to his sister's house tonight to stay there, he request help getting there.  Denies fevers, chills, itching, or exposure to anyone's with athlete's foot.  He has no other pain or concerns.   Foot Pain       Home Medications Prior to Admission medications   Medication Sig Start Date End Date Taking? Authorizing Provider  acetaminophen (TYLENOL 8 HOUR) 650 MG CR tablet Take 1 tablet (650 mg total) by mouth every 8 (eight) hours as needed for pain. 04/18/22   Mannie Stabile, PA-C  bictegravir-emtricitabine-tenofovir AF (BIKTARVY) 50-200-25 MG TABS tablet Take 1 tablet by mouth daily. 10/27/22   Gailen Shelter, PA  cephALEXin (KEFLEX) 500 MG capsule Take 1 capsule (500 mg total) by mouth 3 (three) times daily. 10/21/22   Wallis Bamberg, PA-C  cyclobenzaprine (FLEXERIL) 10 MG tablet Take 1 tablet (10 mg total) by mouth 2 (two) times daily as needed for muscle spasms. 09/26/22   Carroll Sage, PA-C  ketoconazole (NIZORAL) 2 % cream Apply once daily to both feet for 2 weeks. 10/21/22   Wallis Bamberg, PA-C      Allergies    Tenofovir disoproxil and Kiwi extract    Review of Systems   Review of Systems  Musculoskeletal:        Bilateral foot pain  All other systems reviewed and are negative.   Physical Exam Updated Vital Signs BP (!) 123/97 (BP Location: Right Arm)   Pulse 79   Temp 97.8 F (36.6 C)   Resp 16   SpO2 98%  Physical Exam Vitals and  nursing note reviewed.  Constitutional:      General: He is not in acute distress.    Appearance: He is well-developed.  HENT:     Head: Normocephalic and atraumatic.  Eyes:     Conjunctiva/sclera: Conjunctivae normal.  Cardiovascular:     Rate and Rhythm: Normal rate and regular rhythm.     Heart sounds: No murmur heard. Pulmonary:     Effort: Pulmonary effort is normal. No respiratory distress.     Breath sounds: Normal breath sounds.  Abdominal:     Palpations: Abdomen is soft.     Tenderness: There is no abdominal tenderness.  Musculoskeletal:        General: No swelling.     Cervical back: Neck supple.     Comments: Diffuse nonfocal tenderness to the bilateral plantar aspect of the feet.  No skin changes, erythema.  2+ DP and PT pulse.  Normal strength and sensation of the bilateral lower extremities.  Skin:    General: Skin is warm and dry.     Capillary Refill: Capillary refill takes less than 2 seconds.  Neurological:     Mental Status: He is alert.  Psychiatric:        Mood and Affect: Mood normal.     ED Results / Procedures /  Treatments   Labs (all labs ordered are listed, but only abnormal results are displayed) Labs Reviewed  CBG MONITORING, ED    EKG None  Radiology No results found.  Procedures Procedures    Medications Ordered in ED Medications  acetaminophen (TYLENOL) tablet 650 mg (650 mg Oral Given 10/30/22 1422)    ED Course/ Medical Decision Making/ A&P                             Medical Decision Making  32 year old male presenting for chronic bilateral foot pain.  Vital signs reviewed.  On exam he has tenderness to bilateral feet but no skin changes to suggest cellulitis, abscess, fungal infection.  He has not had any trauma, low concern for osseous abnormality including osteomyelitis, fracture, malalignment.  He is neurovascularly intact.  I think his pain is related to frequent walking as he is currently homeless.  Given bus pass to  help him get to his sisters where he can stay, food.  Discharged with return precautions.        Final Clinical Impression(s) / ED Diagnoses Final diagnoses:  Foot pain, bilateral    Rx / DC Orders ED Discharge Orders     None         Fulton Reek, MD 10/30/22 1850    Blane Ohara, MD 10/30/22 [phone]

## 2022-10-30 NOTE — ED Triage Notes (Signed)
Pt is here for evaluation of bilateral foot pain x 2 months.  Not associated with any trauma.  Pt is experiencing homelessness

## 2022-11-22 ENCOUNTER — Encounter (HOSPITAL_COMMUNITY): Payer: Self-pay

## 2022-11-22 ENCOUNTER — Other Ambulatory Visit: Payer: Self-pay

## 2022-11-22 ENCOUNTER — Emergency Department (HOSPITAL_COMMUNITY)
Admission: EM | Admit: 2022-11-22 | Discharge: 2022-11-22 | Disposition: A | Discharge status: Home / Self Care | Payer: No Typology Code available for payment source | Attending: Emergency Medicine | Admitting: Emergency Medicine

## 2022-11-22 DIAGNOSIS — W57XXXA Bitten or stung by nonvenomous insect and other nonvenomous arthropods, initial encounter: Secondary | ICD-10-CM | POA: Insufficient documentation

## 2022-11-22 DIAGNOSIS — S50861A Insect bite (nonvenomous) of right forearm, initial encounter: Secondary | ICD-10-CM

## 2022-11-22 MED ORDER — DIPHENHYDRAMINE HCL 25 MG PO CAPS
25.0000 mg | ORAL_CAPSULE | Freq: Once | ORAL | Status: AC
  Administered 2022-11-22: 25 mg via ORAL
  Filled 2022-11-22: qty 1

## 2022-11-22 MED ORDER — BACITRACIN ZINC 500 UNIT/GM EX OINT
TOPICAL_OINTMENT | Freq: Once | CUTANEOUS | Status: AC
  Administered 2022-11-22: 1 via TOPICAL
  Filled 2022-11-22: qty 0.9

## 2022-11-22 NOTE — Discharge Instructions (Signed)
Thank you for letting us take care of you. You may take over the counter Benadryl or use topical cream as needed for itching or discomfort at the area of the bug bite. I recommend keeping it clean and applying antibiotic ointment twice daily. Follow up with your PCP for any continued concerns or re-evaluation. For any new or worsening symptoms such as trouble breathing, spreading rash from wound, spreading redness or warmth, fever, or other concerns, return to ED for re-evaluation.

## 2022-11-22 NOTE — ED Triage Notes (Signed)
Pt present to ED with c/o unknown insect bite to right arm, onset two hours ago. Pt c/o right arm pain.

## 2022-11-22 NOTE — ED Provider Notes (Signed)
[place] EMERGENCY DEPARTMENT AT [facility] Provider Note   CSN: [id number] Arrival date & time: 11/22/22  1221     History  Chief Complaint  Patient presents with   Insect Bite    Blake Chambers is a 32 y.o. male who presents to the ED complaining of an insect bite to his right forearm.  He states that this occurred 2 hours ago while [place] the woods.  He is currently experiencing housing instability and that is where he stays during the day but he states that he goes to a shelter at night.  He does not know what bit him.  He denies associated fever, body aches, nausea, vomiting, diarrhea, or other acute complaints.  States that the area is pruritic.    Home Medications Prior to Admission medications   Medication Sig Start Date End Date Taking? Authorizing Provider  acetaminophen (TYLENOL 8 HOUR) 650 MG CR tablet Take 1 tablet (650 mg total) by mouth every 8 (eight) hours as needed for pain. 04/18/22   Mannie Stabile, PA-C  bictegravir-emtricitabine-tenofovir AF (BIKTARVY) 50-200-25 MG TABS tablet Take 1 tablet by mouth daily. 10/27/22   Gailen Shelter, PA  cephALEXin (KEFLEX) 500 MG capsule Take 1 capsule (500 mg total) by mouth 3 (three) times daily. 10/21/22   Wallis Bamberg, PA-C  cyclobenzaprine (FLEXERIL) 10 MG tablet Take 1 tablet (10 mg total) by mouth 2 (two) times daily as needed for muscle spasms. 09/26/22   Carroll Sage, PA-C  ketoconazole (NIZORAL) 2 % cream Apply once daily to both feet for 2 weeks. 10/21/22   Wallis Bamberg, PA-C      Allergies    Tenofovir disoproxil and Kiwi extract    Review of Systems   Review of Systems  All other systems reviewed and are negative.   Physical Exam Updated Vital Signs BP 124/87 (BP Location: Left Arm)   Pulse 85   Temp 97.6 F (36.4 C) (Oral)   Resp 16   Ht 5\' 9"  (1.753 m)   Wt 68 kg   SpO2 98%   BMI 22.14 kg/m  Physical Exam Vitals and nursing note reviewed.  Constitutional:       General: He is not in acute distress.    Appearance: Normal appearance. He is not ill-appearing or toxic-appearing.  HENT:     Head: Normocephalic and atraumatic.     Mouth/Throat:     Mouth: Mucous membranes are moist.     Pharynx: Oropharynx is clear.  Eyes:     General: No scleral icterus.    Extraocular Movements: Extraocular movements intact.     Conjunctiva/sclera: Conjunctivae normal.  Cardiovascular:     Rate and Rhythm: Normal rate and regular rhythm.  Pulmonary:     Effort: Pulmonary effort is normal.     Breath sounds: Normal breath sounds.  Abdominal:     General: Abdomen is flat.     Palpations: Abdomen is soft.  Musculoskeletal:        General: Normal range of motion.     Cervical back: Neck supple.     Right lower leg: No edema.     Left lower leg: No edema.     Comments: Small raised area to proximal right forearm with minimal increased warmth and excoriations, no significant erythema, no open wounds, 2+ radial pulse, normal sensation, normal strength, no induration or fluctuance  Skin:    General: Skin is warm and dry.     Capillary Refill:  Capillary refill takes less than 2 seconds.  Neurological:     Mental Status: He is alert. Mental status is at baseline.  Psychiatric:        Behavior: Behavior normal.     ED Results / Procedures / Treatments   Labs (all labs ordered are listed, but only abnormal results are displayed) Labs Reviewed - No data to display  EKG None  Radiology No results found.  Procedures Procedures    Medications Ordered in ED Medications  diphenhydrAMINE (BENADRYL) capsule 25 mg (has no administration in time range)  bacitracin ointment (has no administration in time range)    ED Course/ Medical Decision Making/ A&P                             Medical Decision Making Risk OTC drugs.   Medical Decision Making:   Blake Chambers is a 32 y.o. male who presented to the ED today with insect bite detailed above.     Patient's presentation is complicated by their history of housing instability.  Complete initial physical exam performed, notably the patient was in no acute distress. Small raised area to proximal right forearm with minimal increased warmth and excoriations, no significant erythema, no open wounds, 2+ radial pulse, normal sensation, normal strength, no induration or fluctuance.    Reviewed and confirmed nursing documentation for past medical history, family history, social history.    Initial Assessment:   With the patient's presentation, differential diagnosis includes but is not limited to insect bite, spider bite, cellulitis, allergic reaction, malingering. This is most consistent with an acute complicated illness  Initial Plan:  Symptomatic treatment Wound care Objective evaluation as below reviewed    Final Assessment and Plan:   32 year old male presents to the ED complaining of a bug bite to his right arm.  Area is slightly warm but not erythematous and no induration or fluctuance.  No significant open wound.  Patient in no acute distress.  No associated symptoms.  Vital signs stable.  Describes area as pruritic.  No signs of cellulitis or emergent process.  Will treat itching with Benadryl and discharged home.  Patient given strict ED return precautions, all questions answered, and stable for discharge.   Clinical Impression:  1. Insect bite of right forearm, initial encounter      Discharge           Final Clinical Impression(s) / ED Diagnoses Final diagnoses:  Insect bite of right forearm, initial encounter    Rx / DC Orders ED Discharge Orders     None         Tonette Lederer, PA-C 11/22/22 1255    Rondel Baton, MD 11/25/22 [phone]

## 2022-12-04 ENCOUNTER — Emergency Department (HOSPITAL_COMMUNITY)
Admission: EM | Admit: 2022-12-04 | Discharge: 2022-12-04 | Discharge status: Against Medical Advice | Payer: No Typology Code available for payment source | Attending: Emergency Medicine | Admitting: Emergency Medicine

## 2022-12-04 ENCOUNTER — Other Ambulatory Visit: Payer: Self-pay

## 2022-12-04 ENCOUNTER — Encounter (HOSPITAL_COMMUNITY): Payer: Self-pay

## 2022-12-04 DIAGNOSIS — Z5900 Homelessness unspecified: Secondary | ICD-10-CM | POA: Diagnosis not present

## 2022-12-04 DIAGNOSIS — W5551XA Bitten by raccoon, initial encounter: Secondary | ICD-10-CM | POA: Insufficient documentation

## 2022-12-04 DIAGNOSIS — Z23 Encounter for immunization: Secondary | ICD-10-CM | POA: Diagnosis not present

## 2022-12-04 DIAGNOSIS — Z5329 Procedure and treatment not carried out because of patient's decision for other reasons: Secondary | ICD-10-CM | POA: Insufficient documentation

## 2022-12-04 DIAGNOSIS — S80811A Abrasion, right lower leg, initial encounter: Secondary | ICD-10-CM | POA: Insufficient documentation

## 2022-12-04 DIAGNOSIS — T148XXA Other injury of unspecified body region, initial encounter: Secondary | ICD-10-CM

## 2022-12-04 DIAGNOSIS — Z21 Asymptomatic human immunodeficiency virus [HIV] infection status: Secondary | ICD-10-CM | POA: Insufficient documentation

## 2022-12-04 MED ORDER — RABIES VACCINE, PCEC IM SUSR
1.0000 mL | Freq: Once | INTRAMUSCULAR | Status: DC
  Filled 2022-12-04: qty 1

## 2022-12-04 MED ORDER — RABIES IMMUNE GLOBULIN 150 UNIT/ML IM INJ
1200.0000 [IU] | INJECTION | Freq: Once | INTRAMUSCULAR | Status: AC
  Administered 2022-12-04: 1200 [IU]
  Filled 2022-12-04: qty 8

## 2022-12-04 MED ORDER — TETANUS-DIPHTH-ACELL PERTUSSIS 5-2.5-18.5 LF-MCG/0.5 IM SUSY
0.5000 mL | PREFILLED_SYRINGE | Freq: Once | INTRAMUSCULAR | Status: AC
  Administered 2022-12-04: 0.5 mL via INTRAMUSCULAR
  Filled 2022-12-04: qty 0.5

## 2022-12-04 NOTE — Discharge Instructions (Signed)
You were seen today with concern for a raccoon bite.  While your physical exam does not support a bite, given your living conditions, you were offered rabies vaccination.  You have declined.  Make sure to keep abrasions clean.

## 2022-12-04 NOTE — ED Notes (Signed)
Pt leaving AMA after declining Rabies vaccine. MD aware

## 2022-12-04 NOTE — ED Provider Notes (Signed)
[place] EMERGENCY DEPARTMENT AT [facility] Provider Note   CSN: [id number] Arrival date & time: 12/04/22  0010     History  Chief Complaint  Patient presents with   Animal Bite    Blake Chambers is a 32 y.o. male.  HPI     This is a 32 year old male who presents with reported raccoon bite.  Patient is well-known to our emergency department.  He is homeless.  He lives in a homeless camp.  He states that he saw a raccoon scant running out.  Initially he could not provide details but states that he feels like he was bitten on the leg.  He states that this happened 1 hour prior to arrival.  When asked if he saw the raccoon bite him he states "no" but I think it happened.  Unknown last tetanus.  Home Medications Prior to Admission medications   Medication Sig Start Date End Date Taking? Authorizing Provider  acetaminophen (TYLENOL 8 HOUR) 650 MG CR tablet Take 1 tablet (650 mg total) by mouth every 8 (eight) hours as needed for pain. 04/18/22   Mannie Stabile, PA-C  bictegravir-emtricitabine-tenofovir AF (BIKTARVY) 50-200-25 MG TABS tablet Take 1 tablet by mouth daily. 10/27/22   Gailen Shelter, PA  cephALEXin (KEFLEX) 500 MG capsule Take 1 capsule (500 mg total) by mouth 3 (three) times daily. 10/21/22   Wallis Bamberg, PA-C  cyclobenzaprine (FLEXERIL) 10 MG tablet Take 1 tablet (10 mg total) by mouth 2 (two) times daily as needed for muscle spasms. 09/26/22   Carroll Sage, PA-C  ketoconazole (NIZORAL) 2 % cream Apply once daily to both feet for 2 weeks. 10/21/22   Wallis Bamberg, PA-C      Allergies    Pineapple, Tenofovir disoproxil, and Kiwi extract    Review of Systems   Review of Systems  Skin:  Positive for wound.  All other systems reviewed and are negative.   Physical Exam Updated Vital Signs BP (!) 122/105 (BP Location: Right Arm)   Pulse 95   Temp 98.2 F (36.8 C)   Resp 16   Ht 1.753 m (5\' 9" )   Wt 64.4 kg   SpO2 96%   BMI 20.97 kg/m   Physical Exam Vitals and nursing note reviewed.  Constitutional:      Appearance: He is well-developed.     Comments: Disheveled appearing, nontoxic  HENT:     Head: Normocephalic and atraumatic.  Cardiovascular:     Rate and Rhythm: Normal rate and regular rhythm.  Pulmonary:     Effort: Pulmonary effort is normal. No respiratory distress.  Musculoskeletal:     Cervical back: Neck supple.  Lymphadenopathy:     Cervical: No cervical adenopathy.  Skin:    General: Skin is warm and dry.     Comments: Abrasion and scabbing noted on right mid calf, no adjacent erythema, no fresh bite marks or abrasions noted  Neurological:     Mental Status: He is alert and oriented to person, place, and time.  Psychiatric:        Mood and Affect: Mood normal.     ED Results / Procedures / Treatments   Labs (all labs ordered are listed, but only abnormal results are displayed) Labs Reviewed - No data to display  EKG None  Radiology No results found.  Procedures Procedures    Medications Ordered in ED Medications  rabies vaccine (RABAVERT) injection 1 mL (has no administration in time range)  rabies immune  globulin (HYPERRAB/KEDRAB) injection 1,200 Units (1,200 Units Infiltration Given 12/04/22 0256)  Tdap (BOOSTRIX) injection 0.5 mL (0.5 mLs Intramuscular Given 12/04/22 0256)    ED Course/ Medical Decision Making/ A&P Clinical Course as of 12/04/22 0308  Thu Dec 04, 2022  0157 Discussed with patient that I felt that his physical exam showed abrasions that were older than 1 hour.  Do not appear fresh.  Do not appear consistent with bites.  However, patient is a poor historian and does live in a homeless camp.  We discussed rabies prophylaxis and tetanus.  Patient is requesting treatment. [CH]    Clinical Course User Index [CH] Gabriell Daigneault, Mayer Masker, MD                             Medical Decision Making Risk Prescription drug management.   This patient presents to the ED for  concern of animal bite, this involves an extensive number of treatment options, and is a complaint that carries with it a high risk of complications and morbidity.  I considered the following differential and admission for this acute, potentially life threatening condition.  The differential diagnosis includes bite, abrasion, malingering  MDM:    This is a 32 year old male well-known to our emergency department who presents after reportedly being bitten by a raccoon.  Initially he states that he was scratched by a raccoon.  He is very unclear on details and his wounds do not appear consistent at least with a fresh bite or scratch.  However, he is homeless and his living conditions would increase his risk for exposure to rabid animal.  Initially he consented to rabies vaccination and immuno globulin; however, when the nurse attempted to administer, he refused.  Tetanus was updated.  I do not see any signs or symptoms of infection.  Refusal was documented in the chart.  (Labs, imaging, consults)  Labs: I Ordered, and personally interpreted labs.  The pertinent results include: None  Imaging Studies ordered: I ordered imaging studies including none I independently visualized and interpreted imaging. I agree with the radiologist interpretation  Additional history obtained from chart review.  External records from outside source obtained and reviewed including prior evaluations  Cardiac Monitoring: The patient was not maintained on a cardiac monitor.  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)    Syphilis      Medicines Meds ordered this encounter  Medications   rabies vaccine (RABAVERT) injection 1 mL   rabies immune globulin (HYPERRAB/KEDRAB) injection 1,200 Units   Tdap (BOOSTRIX) injection 0.5 mL    I have reviewed the patients home medicines and have made adjustments as needed  Problem List / ED Course: Problem List Items Addressed This Visit   None Visit Diagnoses     Animal bite    -  Primary  Final Clinical Impression(s) / ED Diagnoses Final diagnoses:  Animal bite    Rx / DC Orders ED Discharge Orders     None         Lizvette Lightsey, Mayer Masker, MD 12/04/22 [phone]

## 2022-12-04 NOTE — ED Notes (Addendum)
Pt refuses Rabies and TDAP vaccine after multiple attempts to educate pt about risks. Pt states " That looks like it hurts and I don't need that shit" MD made aware

## 2022-12-04 NOTE — ED Triage Notes (Addendum)
[place]his [place]" and was scratched by a raccoon on [place] approx 40 minutes PTA

## 2022-12-04 NOTE — ED Notes (Signed)
ED PA Rebekah S. States pt can go to lobby and wait for room.

## 2022-12-14 ENCOUNTER — Emergency Department (HOSPITAL_COMMUNITY)
Admission: EM | Admit: 2022-12-14 | Discharge: 2022-12-14 | Disposition: A | Discharge status: Home / Self Care | Payer: No Typology Code available for payment source | Attending: Emergency Medicine | Admitting: Emergency Medicine

## 2022-12-14 ENCOUNTER — Other Ambulatory Visit: Payer: Self-pay

## 2022-12-14 DIAGNOSIS — M79672 Pain in left foot: Secondary | ICD-10-CM | POA: Insufficient documentation

## 2022-12-14 DIAGNOSIS — Z21 Asymptomatic human immunodeficiency virus [HIV] infection status: Secondary | ICD-10-CM | POA: Diagnosis not present

## 2022-12-14 DIAGNOSIS — M79671 Pain in right foot: Secondary | ICD-10-CM | POA: Diagnosis not present

## 2022-12-14 MED ORDER — IBUPROFEN 800 MG PO TABS
800.0000 mg | ORAL_TABLET | Freq: Once | ORAL | Status: AC
  Administered 2022-12-14: 800 mg via ORAL
  Filled 2022-12-14: qty 1

## 2022-12-14 NOTE — Discharge Instructions (Addendum)
You were evaluated today for bilateral foot pain.  I recommend using ibuprofen and acetaminophen as needed for pain control.  Please follow-up as needed with your primary care provider.

## 2022-12-14 NOTE — ED Triage Notes (Signed)
Patient BIB EMS for evaluation of bilateral foot pain.  Hx of same.  No reports of new injuries.

## 2022-12-14 NOTE — ED Provider Notes (Signed)
[place] EMERGENCY DEPARTMENT AT [facility] Provider Note   CSN: [id number] Arrival date & time: 12/14/22  0143     History  Chief Complaint  Patient presents with   Foot Pain    Blake Chambers is a 32 y.o. male. Patient with history of HIV presenting for bilateral foot pain. Patient states f complaints of bilateral pain to the bottom of the feet for multiple months.  Patient denies any trauma.  He states the pain is diffuse over the bottom of his feet and he states that it is worse with walking.  The patient is homeless and is on his feet throughout the day. Denies fevers, chills, itching, or exposure to anyone's with athlete's foot.  Patient also endorses being hungry.  He has no other concerns at this time.  HPI     Home Medications Prior to Admission medications   Medication Sig Start Date End Date Taking? Authorizing Provider  acetaminophen (TYLENOL 8 HOUR) 650 MG CR tablet Take 1 tablet (650 mg total) by mouth every 8 (eight) hours as needed for pain. 04/18/22   Mannie Stabile, PA-C  bictegravir-emtricitabine-tenofovir AF (BIKTARVY) 50-200-25 MG TABS tablet Take 1 tablet by mouth daily. 10/27/22   Gailen Shelter, PA  cephALEXin (KEFLEX) 500 MG capsule Take 1 capsule (500 mg total) by mouth 3 (three) times daily. 10/21/22   Wallis Bamberg, PA-C  cyclobenzaprine (FLEXERIL) 10 MG tablet Take 1 tablet (10 mg total) by mouth 2 (two) times daily as needed for muscle spasms. 09/26/22   Carroll Sage, PA-C  ketoconazole (NIZORAL) 2 % cream Apply once daily to both feet for 2 weeks. 10/21/22   Wallis Bamberg, PA-C      Allergies    Pineapple, Tenofovir disoproxil, and Kiwi extract    Review of Systems   Review of Systems  Physical Exam Updated Vital Signs BP 117/77 (BP Location: Left Arm)   Pulse 76   Temp 97.7 F (36.5 C) (Axillary)   Resp 16   Ht 5\' 9"  (1.753 m)   Wt 65.8 kg   SpO2 100%   BMI 21.41 kg/m  Physical Exam Vitals and nursing note reviewed.   HENT:     Head: Normocephalic and atraumatic.  Eyes:     Pupils: Pupils are equal, round, and reactive to light.  Pulmonary:     Effort: Pulmonary effort is normal. No respiratory distress.  Musculoskeletal:        General: Tenderness (Mild bilateral foot tenderness to plantar surface) present. No swelling, deformity or signs of injury. Normal range of motion.     Cervical back: Normal range of motion.     Right lower leg: No edema.     Comments: Palpable pedal pulses bilaterally, 5/5 strength in bilateral lower extremities  Skin:    General: Skin is dry.     Comments: No skin changes noted on bilateral feet to suggest cellulitis or fungal infection  Neurological:     Mental Status: He is alert.  Psychiatric:        Speech: Speech normal.        Behavior: Behavior normal.     ED Results / Procedures / Treatments   Labs (all labs ordered are listed, but only abnormal results are displayed) Labs Reviewed - No data to display  EKG None  Radiology No results found.  Procedures Procedures    Medications Ordered in ED Medications  ibuprofen (ADVIL) tablet 800 mg (has no administration in time range)  ED Course/ Medical Decision Making/ A&P                             Medical Decision Making  Patient presenting with chief complaint of chronic bilateral foot pain.  Differential includes pain due to overuse, plantar fasciitis, fracture, dislocation, infection.  Reviewed medical records including previous notes from emergency room visits for foot pain.  The patient is homeless and I feel that this is contributing significantly to his situation.  He was provided with food.  I do not see any indication at this time for imaging.  The patient denies any trauma.  No signs of any deformity/dislocation.  No point tenderness.  Very low clinical suspicion of osseous abnormality including fracture, osteomyelitis.  No physical exam findings to suggest cellulitis, abscess,  fungal infection.  Plan to discharge home at this time after ibuprofen administration.        Final Clinical Impression(s) / ED Diagnoses Final diagnoses:  Bilateral foot pain    Rx / DC Orders ED Discharge Orders     None         Pamala Duffel 12/14/22 1610    Terald Sleeper, MD 12/14/22 [phone]

## 2022-12-22 ENCOUNTER — Emergency Department (HOSPITAL_COMMUNITY): Payer: No Typology Code available for payment source

## 2022-12-22 ENCOUNTER — Emergency Department (HOSPITAL_COMMUNITY)
Admission: EM | Admit: 2022-12-22 | Discharge: 2022-12-22 | Disposition: A | Discharge status: Home / Self Care | Payer: No Typology Code available for payment source | Attending: Emergency Medicine | Admitting: Emergency Medicine

## 2022-12-22 ENCOUNTER — Encounter (HOSPITAL_COMMUNITY): Payer: Self-pay | Admitting: Emergency Medicine

## 2022-12-22 ENCOUNTER — Other Ambulatory Visit: Payer: Self-pay

## 2022-12-22 DIAGNOSIS — R0789 Other chest pain: Secondary | ICD-10-CM | POA: Diagnosis not present

## 2022-12-22 DIAGNOSIS — Z5900 Homelessness unspecified: Secondary | ICD-10-CM | POA: Insufficient documentation

## 2022-12-22 DIAGNOSIS — M546 Pain in thoracic spine: Secondary | ICD-10-CM | POA: Diagnosis not present

## 2022-12-22 LAB — CBC WITH DIFFERENTIAL/PLATELET
Abs Immature Granulocytes: 0.04 10*3/uL (ref 0.00–0.07)
Basophils Absolute: 0 10*3/uL (ref 0.0–0.1)
Basophils Relative: 0 %
Eosinophils Absolute: 0.1 10*3/uL (ref 0.0–0.5)
Eosinophils Relative: 1 %
HCT: 43.5 % (ref 39.0–52.0)
Hemoglobin: 13.9 g/dL (ref 13.0–17.0)
Immature Granulocytes: 1 %
Lymphocytes Relative: 28 %
Lymphs Abs: 1.7 10*3/uL (ref 0.7–4.0)
MCH: 25.7 pg — ABNORMAL LOW (ref 26.0–34.0)
MCHC: 32 g/dL (ref 30.0–36.0)
MCV: 80.4 fL (ref 80.0–100.0)
Monocytes Absolute: 0.6 10*3/uL (ref 0.1–1.0)
Monocytes Relative: 9 %
Neutro Abs: 3.8 10*3/uL (ref 1.7–7.7)
Neutrophils Relative %: 61 %
Platelets: 293 10*3/uL (ref 150–400)
RBC: 5.41 MIL/uL (ref 4.22–5.81)
RDW: 16 % — ABNORMAL HIGH (ref 11.5–15.5)
WBC: 6.2 10*3/uL (ref 4.0–10.5)
nRBC: 0 % (ref 0.0–0.2)

## 2022-12-22 LAB — COMPREHENSIVE METABOLIC PANEL
ALT: 39 U/L (ref 0–44)
AST: 45 U/L — ABNORMAL HIGH (ref 15–41)
Albumin: 3.2 g/dL — ABNORMAL LOW (ref 3.5–5.0)
Alkaline Phosphatase: 62 U/L (ref 38–126)
Anion gap: 12 (ref 5–15)
BUN: 8 mg/dL (ref 6–20)
CO2: 23 mmol/L (ref 22–32)
Calcium: 8.6 mg/dL — ABNORMAL LOW (ref 8.9–10.3)
Chloride: 102 mmol/L (ref 98–111)
Creatinine, Ser: 0.94 mg/dL (ref 0.61–1.24)
GFR, Estimated: >60 mL/min (ref >60)
Glucose, Bld: 92 mg/dL (ref 70–99)
Potassium: 3.7 mmol/L (ref 3.5–5.1)
Sodium: 137 mmol/L (ref 135–145)
Total Bilirubin: 0.8 mg/dL (ref 0.3–1.2)
Total Protein: 9 g/dL — ABNORMAL HIGH (ref 6.5–8.1)

## 2022-12-22 LAB — TROPONIN I (HIGH SENSITIVITY): Troponin I (High Sensitivity): 4 ng/L (ref <18)

## 2022-12-22 MED ORDER — DIAZEPAM 5 MG/ML IJ SOLN
5.0000 mg | Freq: Once | INTRAMUSCULAR | Status: DC | PRN
  Filled 2022-12-22: qty 2

## 2022-12-22 MED ORDER — IBUPROFEN 200 MG PO TABS
600.0000 mg | ORAL_TABLET | Freq: Once | ORAL | Status: AC
  Administered 2022-12-22: 600 mg via ORAL
  Filled 2022-12-22: qty 3

## 2022-12-22 NOTE — Discharge Instructions (Signed)
I strongly recommended that you get an MRI of your back today given your history of HIV to help rule out infection or other emergent cause.  You have declined this.  We discussed this could result in a missed diagnosis that could result in neurologic disability and/or death.  You are encouraged to return to the ER if you change your mind or if you develop any other new or worsening symptoms.  It is very important to follow-up with infectious disease to get restarted on your HIV medicines.  If you develop worsening, recurrent, or continued back pain, numbness or weakness in the legs, incontinence of your bowels or bladders, numbness of your buttocks, fever, abdominal pain, or any other new/concerning symptoms then return to the ER for evaluation.

## 2022-12-22 NOTE — ED Provider Notes (Signed)
[place] EMERGENCY DEPARTMENT AT [facility] Provider Note   CSN: [id number] Arrival date & time: 12/22/22  0827     History  Chief Complaint  Patient presents with   Back Pain    Blake Chambers is a 32 y.o. male.  HPI 32 year old male presents with back pain.  States it started a couple days ago and got worse today.  He is also been dealing with chest pain during that time.  Denies any cough or shortness of breath.  No fevers.  No radicular symptoms, weakness, numbness in his extremities or incontinence.  The pain is primarily thoracic over lumbar.  No trauma.  Home Medications Prior to Admission medications   Medication Sig Start Date End Date Taking? Authorizing Provider  acetaminophen (TYLENOL 8 HOUR) 650 MG CR tablet Take 1 tablet (650 mg total) by mouth every 8 (eight) hours as needed for pain. 04/18/22   Mannie Stabile, PA-C  bictegravir-emtricitabine-tenofovir AF (BIKTARVY) 50-200-25 MG TABS tablet Take 1 tablet by mouth daily. 10/27/22   Gailen Shelter, PA  cephALEXin (KEFLEX) 500 MG capsule Take 1 capsule (500 mg total) by mouth 3 (three) times daily. 10/21/22   Wallis Bamberg, PA-C  cyclobenzaprine (FLEXERIL) 10 MG tablet Take 1 tablet (10 mg total) by mouth 2 (two) times daily as needed for muscle spasms. 09/26/22   Carroll Sage, PA-C  ketoconazole (NIZORAL) 2 % cream Apply once daily to both feet for 2 weeks. 10/21/22   Wallis Bamberg, PA-C      Allergies    Pineapple, Tenofovir disoproxil, and Kiwi extract    Review of Systems   Review of Systems  Constitutional:  Negative for fever.  Respiratory:  Negative for shortness of breath.   Cardiovascular:  Positive for chest pain.  Gastrointestinal:  Negative for abdominal pain.  Genitourinary:  Negative for dysuria.  Musculoskeletal:  Positive for back pain.  Neurological:  Negative for weakness and numbness.    Physical Exam Updated Vital Signs BP 110/70   Pulse 86   Temp 99 F (37.2 C) (Oral)    Resp 16   Ht 5\' 9"  (1.753 m)   Wt 65 kg   SpO2 100%   BMI 21.16 kg/m  Physical Exam Vitals and nursing note reviewed.  Constitutional:      General: He is not in acute distress.    Appearance: He is well-developed. He is not ill-appearing or diaphoretic.  HENT:     Head: Normocephalic and atraumatic.  Cardiovascular:     Rate and Rhythm: Normal rate and regular rhythm.     Heart sounds: Normal heart sounds.  Pulmonary:     Effort: Pulmonary effort is normal.     Breath sounds: Normal breath sounds.  Chest:     Chest wall: Tenderness (right lateral chest) present.  Abdominal:     General: There is no distension.     Palpations: Abdomen is soft.     Tenderness: There is no abdominal tenderness.  Musculoskeletal:     Thoracic back: Tenderness present.     Lumbar back: No tenderness.  Skin:    General: Skin is warm and dry.  Neurological:     Mental Status: He is alert.     Comments: 5/5 strength in BLE. Grossly normal sensation     ED Results / Procedures / Treatments   Labs (all labs ordered are listed, but only abnormal results are displayed) Labs Reviewed  COMPREHENSIVE METABOLIC PANEL - Abnormal; Notable for the following  components:      Result Value   Calcium 8.6 (*)    Total Protein 9.0 (*)    Albumin 3.2 (*)    AST 45 (*)    All other components within normal limits  CBC WITH DIFFERENTIAL/PLATELET - Abnormal; Notable for the following components:   MCH 25.7 (*)    RDW 16.0 (*)    All other components within normal limits  TROPONIN I (HIGH SENSITIVITY)    EKG EKG Interpretation Date/Time:  Monday December 22 2022 09:14:30 EDT Ventricular Rate:  84 PR Interval:  138 QRS Duration:  79 QT Interval:  360 QTC Calculation: 426 R Axis:   71  Text Interpretation: Sinus rhythm RSR' in V1 or V2, probably normal variant no acute ST/T changes similar to May 2024 Confirmed by Pricilla Loveless [phone]) on 12/22/2022 10:06:37 AM  Radiology DG Lumbar Spine  Complete  Result Date: 12/22/2022 CLINICAL DATA:  Back pain EXAM: LUMBAR SPINE - COMPLETE 4 VIEW COMPARISON:  None Available. FINDINGS: There is no evidence of lumbar spine fracture. Alignment is normal. Intervertebral disc spaces are maintained. IMPRESSION: Negative. Electronically Signed   By: Allegra Lai M.D.   On: 12/22/2022 11:03   DG Thoracic Spine W/Swimmers  Result Date: 12/22/2022 CLINICAL DATA:  271510 Thoracic back pain 271510 EXAM: THORACIC SPINE - 3 VIEWS COMPARISON:  CT scan chest, abdomen and pelvis from 08/24/2022. FINDINGS: Unremarkable spinal curvature. No spondylolisthesis. Vertebral body heights are maintained. No aggressive osseous lesion. No significant degenerative changes. Intervertebral disc heights are maintained. Visualized soft tissues are within normal limits. IMPRESSION: *No acute osseous abnormality of the thoracic spine. No significant degenerative changes. Electronically Signed   By: Jules Schick M.D.   On: 12/22/2022 10:44   DG Chest 1 View  Result Date: 12/22/2022 CLINICAL DATA:  back pain EXAM: CHEST  1 VIEW COMPARISON:  CXR 08/24/22 FINDINGS: No pleural effusion. No pneumothorax. Normal cardiac and mediastinal contours. No focal airspace opacity. No radiographically apparent displaced rib fractures. Visualized upper abdomen is unremarkable. IMPRESSION: No focal airspace opacity. Electronically Signed   By: Lorenza Cambridge M.D.   On: 12/22/2022 10:40    Procedures Procedures    Medications Ordered in ED Medications  ibuprofen (ADVIL) tablet 600 mg (600 mg Oral Given 12/22/22 0908)    ED Course/ Medical Decision Making/ A&P                             Medical Decision Making Amount and/or Complexity of Data Reviewed Labs: ordered.    Details: Normal troponin. Radiology: ordered and independent interpretation performed.    Details: No pneumothorax.  No fractures of T or L-spine. ECG/medicine tests: ordered and independent interpretation performed.     Details: No ischemia.  Risk OTC drugs.   Patient presents with nonspecific chest and back pain.  He thinks is from sleeping on the ground though he does have a complicated history including drug abuse and uncontrolled HIV.  Talking further with patient, he has not been on his HIV meds for over a year.  Due to this I am concerned that there could be an underlying spinal cord infection given his last CD4 count in December was around 300.  Discussed doing MRI which she initially agreed to and then later declines.  He is awake, alert, and appears to have insight and understands that not doing an MRI could result in a missed or delayed diagnosis and a potentially lethal or  disabling infection of his spinal cord.  He understands he can return at any time.  Otherwise the lab work and x-rays and EKG are unremarkable.  Doubt ACS, PE, dissection.         Final Clinical Impression(s) / ED Diagnoses Final diagnoses:  Acute thoracic back pain, unspecified back pain laterality    Rx / DC Orders ED Discharge Orders     None         Pricilla Loveless, MD 12/22/22 1630

## 2022-12-22 NOTE — ED Triage Notes (Signed)
[place] with reports of back pain x2 days from sleeping on ground. Pt ambulatory to chair and currently asleep and snoring.   EMS vitals: 116/82 82 HR 100% RA 122 CBG

## 2022-12-22 NOTE — ED Notes (Signed)
Pt refusing MRI at this time. States he will come back for it another time.

## 2022-12-29 ENCOUNTER — Other Ambulatory Visit: Payer: Self-pay

## 2022-12-29 ENCOUNTER — Ambulatory Visit (HOSPITAL_COMMUNITY): Admission: RE | Admit: 2022-12-29 | Payer: No Typology Code available for payment source | Source: Ambulatory Visit

## 2022-12-29 ENCOUNTER — Emergency Department (HOSPITAL_COMMUNITY)
Admission: EM | Admit: 2022-12-29 | Discharge: 2022-12-29 | Discharge status: Against Medical Advice | Payer: No Typology Code available for payment source | Attending: Emergency Medicine | Admitting: Emergency Medicine

## 2022-12-29 DIAGNOSIS — Z21 Asymptomatic human immunodeficiency virus [HIV] infection status: Secondary | ICD-10-CM | POA: Insufficient documentation

## 2022-12-29 DIAGNOSIS — Z5321 Procedure and treatment not carried out due to patient leaving prior to being seen by health care provider: Secondary | ICD-10-CM | POA: Insufficient documentation

## 2022-12-29 DIAGNOSIS — M545 Low back pain, unspecified: Secondary | ICD-10-CM | POA: Insufficient documentation

## 2022-12-29 DIAGNOSIS — R200 Anesthesia of skin: Secondary | ICD-10-CM | POA: Insufficient documentation

## 2022-12-29 LAB — CBC WITH DIFFERENTIAL/PLATELET
Abs Immature Granulocytes: 0.02 10*3/uL (ref 0.00–0.07)
Basophils Absolute: 0 10*3/uL (ref 0.0–0.1)
Basophils Relative: 1 %
Eosinophils Absolute: 0.2 10*3/uL (ref 0.0–0.5)
Eosinophils Relative: 4 %
HCT: 39.9 % (ref 39.0–52.0)
Hemoglobin: 12.9 g/dL — ABNORMAL LOW (ref 13.0–17.0)
Immature Granulocytes: 0 %
Lymphocytes Relative: 44 %
Lymphs Abs: 2.2 10*3/uL (ref 0.7–4.0)
MCH: 26.3 pg (ref 26.0–34.0)
MCHC: 32.3 g/dL (ref 30.0–36.0)
MCV: 81.3 fL (ref 80.0–100.0)
Monocytes Absolute: 0.4 10*3/uL (ref 0.1–1.0)
Monocytes Relative: 8 %
Neutro Abs: 2.1 10*3/uL (ref 1.7–7.7)
Neutrophils Relative %: 43 %
Platelets: 316 10*3/uL (ref 150–400)
RBC: 4.91 MIL/uL (ref 4.22–5.81)
RDW: 15.9 % — ABNORMAL HIGH (ref 11.5–15.5)
WBC: 4.8 10*3/uL (ref 4.0–10.5)
nRBC: 0 % (ref 0.0–0.2)

## 2022-12-29 LAB — COMPREHENSIVE METABOLIC PANEL
ALT: 43 U/L (ref 0–44)
AST: 63 U/L — ABNORMAL HIGH (ref 15–41)
Albumin: 2.9 g/dL — ABNORMAL LOW (ref 3.5–5.0)
Alkaline Phosphatase: 71 U/L (ref 38–126)
Anion gap: 8 (ref 5–15)
BUN: 11 mg/dL (ref 6–20)
CO2: 23 mmol/L (ref 22–32)
Calcium: 8.4 mg/dL — ABNORMAL LOW (ref 8.9–10.3)
Chloride: 106 mmol/L (ref 98–111)
Creatinine, Ser: 1.03 mg/dL (ref 0.61–1.24)
GFR, Estimated: >60 mL/min (ref >60)
Glucose, Bld: 90 mg/dL (ref 70–99)
Potassium: 4.2 mmol/L (ref 3.5–5.1)
Sodium: 137 mmol/L (ref 135–145)
Total Bilirubin: 0.6 mg/dL (ref 0.3–1.2)
Total Protein: 7.8 g/dL (ref 6.5–8.1)

## 2022-12-29 LAB — LACTIC ACID, PLASMA: Lactic Acid, Venous: 1.7 mmol/L (ref 0.5–1.9)

## 2022-12-29 MED ORDER — LORAZEPAM 1 MG PO TABS: 0.5000 mg | ORAL_TABLET | Freq: Once | ORAL | Status: DC | PRN

## 2022-12-29 NOTE — ED Notes (Signed)
Pt became very verbally aggressive ripped IV out of left hand himself. Yelled at all professional medical staff that we had lied to him and that we were lazy bitches and continued to leave while still yelling out expletives.

## 2022-12-29 NOTE — ED Provider Triage Note (Cosign Needed)
Emergency Medicine Provider Triage Evaluation Note  Blake Chambers , a 32 y.o. male  was evaluated in triage.  Pt complains of lower back pain with radiation down his right leg.  Reports numbness to his right leg as well.  Reports has been having this pain since walking around for a day and a half pushing a cart.  Does have history of HIV that he is not taking any medication for for over a year.  He reports previous history of IV drug use but has not used any drugs in 3 months per patient.  Denies any fevers.  He denies any saddle anesthesia, or urinary/fecal incontinence.  Review of Systems  Positive:  Negative:   Physical Exam  BP 108/70 (BP Location: Right Arm)   Pulse 82   Temp 97.6 F (36.4 C) (Oral)   Resp 17   SpO2 98%  Gen:   Awake, no distress   Resp:  Normal effort  MSK:   Moves extremities without difficulty  Other:  Reports decrease sensation in his right lower extremity.  Generalized weakness throughout.  Medical Decision Making  Medically screening exam initiated at 6:48 PM.  Appropriate orders placed.  Blake Chambers was informed that the remainder of the evaluation will be completed by another provider, this initial triage assessment does not replace that evaluation, and the importance of remaining in the ED until their evaluation is complete.  MRI ordered with labs. Consulted with my attending physician,    Achille Rich, PA-C 12/29/22 1853

## 2022-12-29 NOTE — ED Triage Notes (Signed)
Patient reports back pain, leg pain, and foot pain after walking for 1.5 days. Patient also states wound to L cheek causing him pain. Hx HIV and not taking his medications because he cannot afford them.

## 2023-01-02 LAB — CULTURE, BLOOD (ROUTINE X 2)
Culture: NO GROWTH
Special Requests: ADEQUATE

## 2023-01-03 LAB — CULTURE, BLOOD (ROUTINE X 2): Culture: NO GROWTH

## 2023-02-05 ENCOUNTER — Other Ambulatory Visit: Payer: No Typology Code available for payment source

## 2023-02-05 ENCOUNTER — Other Ambulatory Visit: Payer: Self-pay

## 2023-02-05 DIAGNOSIS — Z113 Encounter for screening for infections with a predominantly sexual mode of transmission: Secondary | ICD-10-CM

## 2023-02-05 DIAGNOSIS — Z79899 Other long term (current) drug therapy: Secondary | ICD-10-CM

## 2023-02-05 DIAGNOSIS — B20 Human immunodeficiency virus [HIV] disease: Secondary | ICD-10-CM

## 2023-02-13 ENCOUNTER — Ambulatory Visit: Payer: No Typology Code available for payment source | Admitting: Internal Medicine

## 2023-05-07 ENCOUNTER — Ambulatory Visit: Payer: No Typology Code available for payment source | Admitting: Internal Medicine

## 2023-05-10 ENCOUNTER — Emergency Department (HOSPITAL_COMMUNITY)
Admission: EM | Admit: 2023-05-10 | Discharge: 2023-05-10 | Disposition: A | Discharge status: Home / Self Care | Payer: No Typology Code available for payment source | Attending: Emergency Medicine | Admitting: Emergency Medicine

## 2023-05-10 ENCOUNTER — Emergency Department (HOSPITAL_COMMUNITY): Payer: No Typology Code available for payment source

## 2023-05-10 ENCOUNTER — Other Ambulatory Visit: Payer: Self-pay

## 2023-05-10 ENCOUNTER — Encounter (HOSPITAL_COMMUNITY): Payer: Self-pay

## 2023-05-10 DIAGNOSIS — R410 Disorientation, unspecified: Secondary | ICD-10-CM | POA: Diagnosis not present

## 2023-05-10 DIAGNOSIS — T699XXA Effect of reduced temperature, unspecified, initial encounter: Secondary | ICD-10-CM | POA: Diagnosis present

## 2023-05-10 DIAGNOSIS — Z5900 Homelessness unspecified: Secondary | ICD-10-CM | POA: Diagnosis not present

## 2023-05-10 DIAGNOSIS — F1510 Other stimulant abuse, uncomplicated: Secondary | ICD-10-CM | POA: Insufficient documentation

## 2023-05-10 DIAGNOSIS — X31XXXA Exposure to excessive natural cold, initial encounter: Secondary | ICD-10-CM | POA: Diagnosis not present

## 2023-05-10 DIAGNOSIS — Y900 Blood alcohol level of less than 20 mg/100 ml: Secondary | ICD-10-CM | POA: Diagnosis not present

## 2023-05-10 LAB — COMPREHENSIVE METABOLIC PANEL
ALT: 35 U/L (ref 0–44)
AST: 36 U/L (ref 15–41)
Albumin: 3.3 g/dL — ABNORMAL LOW (ref 3.5–5.0)
Alkaline Phosphatase: 58 U/L (ref 38–126)
Anion gap: 10 (ref 5–15)
BUN: 18 mg/dL (ref 6–20)
CO2: 25 mmol/L (ref 22–32)
Calcium: 8.9 mg/dL (ref 8.9–10.3)
Chloride: 104 mmol/L (ref 98–111)
Creatinine, Ser: 1.26 mg/dL — ABNORMAL HIGH (ref 0.61–1.24)
GFR, Estimated: >60 mL/min (ref >60)
Glucose, Bld: 77 mg/dL (ref 70–99)
Potassium: 3.7 mmol/L (ref 3.5–5.1)
Sodium: 139 mmol/L (ref 135–145)
Total Bilirubin: 0.7 mg/dL (ref <1.2)
Total Protein: 8.2 g/dL — ABNORMAL HIGH (ref 6.5–8.1)

## 2023-05-10 LAB — RAPID URINE DRUG SCREEN, HOSP PERFORMED
Amphetamines: POSITIVE — AB
Barbiturates: NOT DETECTED
Benzodiazepines: NOT DETECTED
Cocaine: NOT DETECTED
Opiates: NOT DETECTED
Tetrahydrocannabinol: NOT DETECTED

## 2023-05-10 LAB — CBC WITH DIFFERENTIAL/PLATELET
Abs Immature Granulocytes: 0.01 10*3/uL (ref 0.00–0.07)
Basophils Absolute: 0 10*3/uL (ref 0.0–0.1)
Basophils Relative: 0 %
Eosinophils Absolute: 0.1 10*3/uL (ref 0.0–0.5)
Eosinophils Relative: 2 %
HCT: 42 % (ref 39.0–52.0)
Hemoglobin: 13.3 g/dL (ref 13.0–17.0)
Immature Granulocytes: 0 %
Lymphocytes Relative: 31 %
Lymphs Abs: 1.9 10*3/uL (ref 0.7–4.0)
MCH: 25.7 pg — ABNORMAL LOW (ref 26.0–34.0)
MCHC: 31.7 g/dL (ref 30.0–36.0)
MCV: 81.2 fL (ref 80.0–100.0)
Monocytes Absolute: 0.6 10*3/uL (ref 0.1–1.0)
Monocytes Relative: 10 %
Neutro Abs: 3.4 10*3/uL (ref 1.7–7.7)
Neutrophils Relative %: 57 %
Platelets: 252 10*3/uL (ref 150–400)
RBC: 5.17 MIL/uL (ref 4.22–5.81)
RDW: 15.9 % — ABNORMAL HIGH (ref 11.5–15.5)
WBC: 6.1 10*3/uL (ref 4.0–10.5)
nRBC: 0 % (ref 0.0–0.2)

## 2023-05-10 LAB — RPR
RPR Ser Ql: REACTIVE — AB
RPR Titer: 1:4 {titer}

## 2023-05-10 LAB — ETHANOL: Alcohol, Ethyl (B): <10 mg/dL (ref <10)

## 2023-05-10 LAB — I-STAT CG4 LACTIC ACID, ED: Lactic Acid, Venous: 0.5 mmol/L (ref 0.5–1.9)

## 2023-05-10 NOTE — ED Triage Notes (Signed)
Pt to ED via [facility] from a parking lot. Pt was outside and flagged down PD, PD called EMS due to patient being outside, being cold and not making sense. EMS reports pt A&Ox2, unable to tell how long he has been outside. Pt wearing shorts and cold to touch. Pt shaking when asked to stretch out for evaluation. Pt mumbling incoherently and not answering questions on arrival. Pt able to say his name and is at hospital.   EMS VS: 142/palp HR 84 RR 18 Cbg=124

## 2023-05-10 NOTE — ED Notes (Signed)
Pt dressed for discharge . Pt ambulated from ed with security.

## 2023-05-10 NOTE — ED Provider Notes (Signed)
[facility]-EMERGENCY DEPT [facility] Emergency Department Provider Note MRN:  [id number]  Arrival date & time: 05/10/23     Chief Complaint   Homeless   History of Present Illness   Alexey Starwalt is a 32 y.o. year-old male presents to the ED with chief complaint of confusion.  Hx of HIV, bipolar, syphilis, polysubstance abuse.  Was found outside by [facility] and waived them down.  He was in shorts and t-shirt on this cold night.  He doesn't give me any history at all.    Level 5 caveat 2/2 confusion.  6:04 AM Patient now awake and oriented.  States that he has been moving around because of homelessness.  Hasn't been being treated well because of being homeless.  Recently released from jail and states that he enjoyed being there.    States that he is primarily here because he is cold.  History provided by patient.   Review of Systems  Pertinent positive and negative review of systems noted in HPI.    Physical Exam   Vitals:   05/10/23 0402 05/10/23 0550  BP: 95/84 116/64  Pulse: 77 88  Resp: 16 15  Temp: 98.4 F (36.9 C) 97.8 F (36.6 C)  SpO2: 99% 99%    CONSTITUTIONAL:  non toxic-appearing, NAD NEURO: Initially uncooperative, now,  Alert and oriented x 3, CN 3-12 grossly intact EYES:  eyes equal and reactive ENT/NECK:  Supple, no stridor  CARDIO:  normal rate, regular rhythm, appears well-perfused  PULM:  No respiratory distress, CTAB GI/GU:  non-distended,  MSK/SPINE:  No gross deformities, no edema, moves all extremities  SKIN:  no rash, atraumatic   *Additional and/or pertinent findings included in MDM below  Diagnostic and Interventional Summary    EKG Interpretation Date/Time:    Ventricular Rate:    PR Interval:    QRS Duration:    QT Interval:    QTC Calculation:   R Axis:      Text Interpretation:         Labs Reviewed  COMPREHENSIVE METABOLIC PANEL - Abnormal; Notable for the following components:      Result Value   Creatinine, Ser 1.26 (*)     Total Protein 8.2 (*)    Albumin 3.3 (*)    All other components within normal limits  CBC WITH DIFFERENTIAL/PLATELET - Abnormal; Notable for the following components:   MCH 25.7 (*)    RDW 15.9 (*)    All other components within normal limits  RAPID URINE DRUG SCREEN, HOSP PERFORMED - Abnormal; Notable for the following components:   Amphetamines POSITIVE (*)    All other components within normal limits  ETHANOL  RPR  I-STAT CG4 LACTIC ACID, ED    CT HEAD WO CONTRAST ( )  Final Result      Medications - No data to display   Procedures  /  Critical Care Procedures  ED Course and Medical Decision Making  I have reviewed the triage vital signs, the nursing notes, and pertinent available records from the EMR.  Social Determinants Affecting Complexity of Care: Patient has no clinically significant social determinants affecting this chief complaint..   ED Course: Clinical Course as of 05/10/23 0604  Wynelle Link May 10, 2023  0603 CT HEAD WO CONTRAST ( ) No obvious ICH [RB]  0603 Comprehensive metabolic panel(!) No significant electrolyte abnormality [RB]  0603 CBC with Diff(!) No leukocytosis or anemia [RB]  0604 Rapid urine drug screen (hospital performed)(!) Positive for amphetamines [RB]    Clinical  Course User Index [RB] Roxy Horseman, PA-C    Medical Decision Making Patient here with homelessness and being cold.  Was initially acting strange and altered, but might have been just resisting for some kind of secondary gain.  AMS workup was initiated, but now that the patient is warm and more comfortable, he is cooperative and responding well.  Eating and drinking.    Plan for discharge barring any significant abnormality on workup.  Amount and/or Complexity of Data Reviewed Labs: ordered. Decision-making details documented in ED Course. Radiology: ordered and independent interpretation performed. Decision-making details documented in ED Course.          Consultants: No consultations were needed in caring for this patient.   Treatment and Plan: Emergency department workup does not suggest an emergent condition requiring admission or immediate intervention beyond  what has been performed at this time. The patient is safe for discharge and has  been instructed to return immediately for worsening symptoms, change in  symptoms or any other concerns    Final Clinical Impressions(s) / ED Diagnoses     ICD-10-CM   1. Homelessness  Z59.00     2. Cold exposure, initial encounter  [id number]       ED Discharge Orders     None         Discharge Instructions Discussed with and Provided to Patient:   Discharge Instructions   None      Roxy Horseman, PA-C 05/10/23 0604    Nira Conn, MD 05/10/23 [phone]

## 2023-05-11 LAB — T.PALLIDUM AB, TOTAL: T Pallidum Abs: REACTIVE — AB

## 2023-05-21 ENCOUNTER — Emergency Department (HOSPITAL_COMMUNITY)
Admission: EM | Admit: 2023-05-21 | Discharge: 2023-05-22 | Disposition: A | Discharge status: Home / Self Care | Payer: No Typology Code available for payment source | Attending: Emergency Medicine | Admitting: Emergency Medicine

## 2023-05-21 ENCOUNTER — Other Ambulatory Visit: Payer: Self-pay

## 2023-05-21 ENCOUNTER — Encounter (HOSPITAL_COMMUNITY): Payer: Self-pay

## 2023-05-21 DIAGNOSIS — Z5900 Homelessness unspecified: Secondary | ICD-10-CM | POA: Diagnosis not present

## 2023-05-21 DIAGNOSIS — R109 Unspecified abdominal pain: Secondary | ICD-10-CM | POA: Diagnosis present

## 2023-05-21 DIAGNOSIS — R1084 Generalized abdominal pain: Secondary | ICD-10-CM | POA: Insufficient documentation

## 2023-05-21 LAB — CBC WITH DIFFERENTIAL/PLATELET
Abs Immature Granulocytes: 0.06 10*3/uL (ref 0.00–0.07)
Basophils Absolute: 0 10*3/uL (ref 0.0–0.1)
Basophils Relative: 0 %
Eosinophils Absolute: 0.1 10*3/uL (ref 0.0–0.5)
Eosinophils Relative: 1 %
HCT: 41.7 % (ref 39.0–52.0)
Hemoglobin: 13.2 g/dL (ref 13.0–17.0)
Immature Granulocytes: 1 %
Lymphocytes Relative: 12 %
Lymphs Abs: 1.1 10*3/uL (ref 0.7–4.0)
MCH: 25.4 pg — ABNORMAL LOW (ref 26.0–34.0)
MCHC: 31.7 g/dL (ref 30.0–36.0)
MCV: 80.3 fL (ref 80.0–100.0)
Monocytes Absolute: 0.8 10*3/uL (ref 0.1–1.0)
Monocytes Relative: 9 %
Neutro Abs: 7.2 10*3/uL (ref 1.7–7.7)
Neutrophils Relative %: 77 %
Platelets: 412 10*3/uL — ABNORMAL HIGH (ref 150–400)
RBC: 5.19 MIL/uL (ref 4.22–5.81)
RDW: 16.2 % — ABNORMAL HIGH (ref 11.5–15.5)
WBC: 9.2 10*3/uL (ref 4.0–10.5)
nRBC: 0 % (ref 0.0–0.2)

## 2023-05-21 LAB — COMPREHENSIVE METABOLIC PANEL
ALT: 30 U/L (ref 0–44)
AST: 36 U/L (ref 15–41)
Albumin: 3.1 g/dL — ABNORMAL LOW (ref 3.5–5.0)
Alkaline Phosphatase: 70 U/L (ref 38–126)
Anion gap: 10 (ref 5–15)
BUN: 13 mg/dL (ref 6–20)
CO2: 22 mmol/L (ref 22–32)
Calcium: 8.4 mg/dL — ABNORMAL LOW (ref 8.9–10.3)
Chloride: 104 mmol/L (ref 98–111)
Creatinine, Ser: 1.12 mg/dL (ref 0.61–1.24)
GFR, Estimated: >60 mL/min (ref >60)
Glucose, Bld: 118 mg/dL — ABNORMAL HIGH (ref 70–99)
Potassium: 3.8 mmol/L (ref 3.5–5.1)
Sodium: 136 mmol/L (ref 135–145)
Total Bilirubin: 0.3 mg/dL (ref <1.2)
Total Protein: 7.9 g/dL (ref 6.5–8.1)

## 2023-05-21 NOTE — ED Triage Notes (Signed)
Pt arrived fia [facility] c/o abd pain 8/10, all over abd, no specific

## 2023-05-21 NOTE — ED Provider Triage Note (Signed)
Emergency Medicine Provider Triage Evaluation Note  Blake Chambers , a 32 y.o. male  was evaluated in triage.  Pt complains of abdominal pain.  Patient currently experiencing homelessness.  I asked the patient if he just needed a place to be this evening and he said yes.  Will check basic labs on the patient.  He is appropriate to go to the lobby.  Review of Systems    Physical Exam  BP 131/81 (BP Location: Left Arm)   Pulse (!) 112   Temp 98.3 F (36.8 C) (Oral)   Resp 16   SpO2 98%  Gen:   Awake, no distress   Resp:  Normal effort  MSK:   Moves extremities without difficulty  Other:    Medical Decision Making  Medically screening exam initiated at 10:23 PM.  Appropriate orders placed.  Dag Rohloff was informed that the remainder of the evaluation will be completed by another provider, this initial triage assessment does not replace that evaluation, and the importance of remaining in the ED until their evaluation is complete.  Basic labs order, patient appropriate for lobby.   Anders Simmonds T, DO 05/21/23 2224

## 2023-05-22 ENCOUNTER — Emergency Department (HOSPITAL_COMMUNITY): Payer: No Typology Code available for payment source

## 2023-05-22 LAB — URINALYSIS, ROUTINE W REFLEX MICROSCOPIC
Bacteria, UA: NONE SEEN
Glucose, UA: NEGATIVE mg/dL
Hgb urine dipstick: NEGATIVE
Ketones, ur: NEGATIVE mg/dL
Leukocytes,Ua: NEGATIVE
Nitrite: NEGATIVE
Protein, ur: 30 mg/dL — AB
Specific Gravity, Urine: 1.033 — ABNORMAL HIGH (ref 1.005–1.030)
pH: 6 (ref 5.0–8.0)

## 2023-05-22 LAB — LIPASE, BLOOD: Lipase: 28 U/L (ref 11–51)

## 2023-05-22 MED ORDER — IOHEXOL 350 MG/ML SOLN
75.0000 mL | Freq: Once | INTRAVENOUS | Status: AC | PRN
  Administered 2023-05-22: 75 mL via INTRAVENOUS

## 2023-05-22 NOTE — ED Notes (Signed)
Pt was called x3 and No answer.

## 2023-05-22 NOTE — ED Provider Notes (Signed)
[place] EMERGENCY DEPARTMENT AT [facility] Provider Note   CSN: [id number] Arrival date & time: 05/21/23  2207     History  Chief Complaint  Patient presents with   Abdominal Pain    Blake Chambers is a 32 y.o. male.  Pt is a 32 yo male with pmhx significant for HIV, bipolar, syphilis, and polysubstance abuse.  Pt is homeless and came in complaining of abdominal pain. He did tell the doctor who did the initial evaluation note that he just wanted a place to stay inside for the night.  It was 20 degrees last night.  Pt has been in the lobby all night and just came back to a room.  He is a poor historian and just wants to sleep.  I have not seen any recent notes from ID, so it is unlikely that he's taking his HIV meds.       Home Medications Prior to Admission medications   Medication Sig Start Date End Date Taking? Authorizing Provider  acetaminophen (TYLENOL 8 HOUR) 650 MG CR tablet Take 1 tablet (650 mg total) by mouth every 8 (eight) hours as needed for pain. 04/18/22   Mannie Stabile, PA-C  bictegravir-emtricitabine-tenofovir AF (BIKTARVY) 50-200-25 MG TABS tablet Take 1 tablet by mouth daily. 10/27/22   Gailen Shelter, PA  cephALEXin (KEFLEX) 500 MG capsule Take 1 capsule (500 mg total) by mouth 3 (three) times daily. 10/21/22   Wallis Bamberg, PA-C  cyclobenzaprine (FLEXERIL) 10 MG tablet Take 1 tablet (10 mg total) by mouth 2 (two) times daily as needed for muscle spasms. 09/26/22   Carroll Sage, PA-C  ketoconazole (NIZORAL) 2 % cream Apply once daily to both feet for 2 weeks. 10/21/22   Wallis Bamberg, PA-C      Allergies    Pineapple, Tenofovir disoproxil, and Kiwi extract    Review of Systems   Review of Systems  Gastrointestinal:  Positive for abdominal pain.  All other systems reviewed and are negative.   Physical Exam Updated Vital Signs BP 131/81 (BP Location: Left Arm)   Pulse (!) 112   Temp 98.3 F (36.8 C) (Oral)   Resp 16   SpO2 98%   Physical Exam Vitals and nursing note reviewed.  Constitutional:      Appearance: He is well-developed.  HENT:     Head: Normocephalic and atraumatic.     Mouth/Throat:     Mouth: Mucous membranes are moist.     Pharynx: Oropharynx is clear.  Eyes:     Extraocular Movements: Extraocular movements intact.     Pupils: Pupils are equal, round, and reactive to light.  Cardiovascular:     Rate and Rhythm: Normal rate and regular rhythm.     Heart sounds: Normal heart sounds.  Pulmonary:     Effort: Pulmonary effort is normal.     Breath sounds: Normal breath sounds.  Abdominal:     General: Abdomen is flat. Bowel sounds are normal.     Palpations: Abdomen is soft.  Skin:    General: Skin is warm.     Capillary Refill: Capillary refill takes less than 2 seconds.  Neurological:     General: No focal deficit present.     Mental Status: He is alert and oriented to person, place, and time.  Psychiatric:        Mood and Affect: Mood normal.        Behavior: Behavior normal.     ED Results / Procedures /  Treatments   Labs (all labs ordered are listed, but only abnormal results are displayed) Labs Reviewed  COMPREHENSIVE METABOLIC PANEL - Abnormal; Notable for the following components:      Result Value   Glucose, Bld 118 (*)    Calcium 8.4 (*)    Albumin 3.1 (*)    All other components within normal limits  CBC WITH DIFFERENTIAL/PLATELET - Abnormal; Notable for the following components:   MCH 25.4 (*)    RDW 16.2 (*)    Platelets 412 (*)    All other components within normal limits  URINALYSIS, ROUTINE W REFLEX MICROSCOPIC - Abnormal; Notable for the following components:   Color, Urine AMBER (*)    Specific Gravity, Urine 1.033 (*)    Bilirubin Urine SMALL (*)    Protein, ur 30 (*)    All other components within normal limits  LIPASE, BLOOD    EKG None  Radiology CT ABDOMEN PELVIS W CONTRAST  Result Date: 05/22/2023 CLINICAL DATA:  Diffuse abdominal pain. EXAM:  CT ABDOMEN AND PELVIS WITH CONTRAST TECHNIQUE: Multidetector CT imaging of the abdomen and pelvis was performed using the standard protocol following bolus administration of intravenous contrast. RADIATION DOSE REDUCTION: This exam was performed according to the departmental dose-optimization program which includes automated exposure control, adjustment of the mA and/or kV according to patient size and/or use of iterative reconstruction technique. CONTRAST:  75mL OMNIPAQUE IOHEXOL 350 MG/ML SOLN COMPARISON:  August 24, 2022 FINDINGS: Lower chest: No acute abnormality. Hepatobiliary: No focal liver abnormality is seen. No gallstones, gallbladder wall thickening, or biliary dilatation. Pancreas: Unremarkable. No pancreatic ductal dilatation or surrounding inflammatory changes. Spleen: Normal in size without focal abnormality. Adrenals/Urinary Tract: Adrenal glands are unremarkable. Kidneys are normal, without renal calculi, focal lesion, or hydronephrosis. Bladder is unremarkable. Stomach/Bowel: Stomach is within normal limits. Appendix appears normal. No evidence of bowel dilatation. Markedly thickened and inflamed small bowel loops are seen throughout the abdomen and pelvis. This involves predominantly the length of the jejunum and proximal ileum. Vascular/Lymphatic: No significant vascular findings are present. No enlarged abdominal or pelvic lymph nodes. Reproductive: Prostate is unremarkable. Other: No abdominal wall hernia or abnormality. No abdominopelvic ascites. Musculoskeletal: No acute or significant osseous findings. IMPRESSION: Markedly thickened and inflamed small bowel loops throughout the abdomen and pelvis, consistent with an infectious or inflammatory enteritis. Electronically Signed   By: Aram Candela M.D.   On: 05/22/2023 02:17    Procedures Procedures    Medications Ordered in ED Medications  iohexol (OMNIPAQUE) 350 MG/ML injection 75 mL (75 mLs Intravenous Contrast Given 05/22/23 0203)     ED Course/ Medical Decision Making/ A&P                                 Medical Decision Making  This patient presents to the ED for concern of abd pain, this involves an extensive number of treatment options, and is a complaint that carries with it a high risk of complications and morbidity.  The differential diagnosis includes gastroenteritis, appendicitis, cholecystitis, uti   Co morbidities that complicate the patient evaluation  Hx of HIV, bipolar, syphilis, polysubstance abuse    Additional history obtained:  Additional history obtained from epic chart review  Lab Tests:  I Ordered, and personally interpreted labs.  The pertinent results include:  cmp nl, ua nl other than protein, cbc nl, lip nl   Imaging Studies ordered:  I ordered imaging studies  including ct abd/pelvis  I independently visualized and interpreted imaging which showed  Markedly thickened and inflamed small bowel loops throughout the  abdomen and pelvis, consistent with an infectious or inflammatory  enteritis.   I agree with the radiologist interpretation   Medicines ordered and prescription drug management:   I have reviewed the patients home medicines and have made adjustments as needed   Test Considered:  ct   Problem List / ED Course:  Abd pain:  labs nl.  Pt has no pain now.  I suspect he just wanted a place that was warm.  CT did show some enteritis, but nothing that would require intervention.  Pt able to eat w/o problems Homeless status:  shelter info given.  [facility] and other shelters with [facility] [place] during this [place] snap. Hx HIV:  pt given the number to [facility] to f/u.   Reevaluation:  After the interventions noted above, I reevaluated the patient and found that they have :improved   Social Determinants of Health:  homeless   Dispostion:  After consideration of the diagnostic results and the patients response to treatment, I feel that the patent would benefit from  discharge with outpatient tx.          Final Clinical Impression(s) / ED Diagnoses Final diagnoses:  Generalized abdominal pain  Homeless    Rx / DC Orders ED Discharge Orders     None         Jacalyn Lefevre, MD 05/22/23 [phone]

## 2023-05-22 NOTE — ED Notes (Signed)
Patient is complaining of increasing pain and requesting to be re-evaluated. Triage RN, notified.

## 2023-05-23 ENCOUNTER — Other Ambulatory Visit (HOSPITAL_COMMUNITY): Payer: Self-pay

## 2023-05-23 ENCOUNTER — Emergency Department (HOSPITAL_COMMUNITY)
Admission: EM | Admit: 2023-05-23 | Discharge: 2023-05-23 | Disposition: A | Discharge status: Home / Self Care | Payer: MEDICAID | Attending: Emergency Medicine | Admitting: Emergency Medicine

## 2023-05-23 ENCOUNTER — Encounter (HOSPITAL_COMMUNITY): Payer: Self-pay

## 2023-05-23 DIAGNOSIS — K529 Noninfective gastroenteritis and colitis, unspecified: Secondary | ICD-10-CM | POA: Diagnosis not present

## 2023-05-23 DIAGNOSIS — R109 Unspecified abdominal pain: Secondary | ICD-10-CM | POA: Diagnosis present

## 2023-05-23 DIAGNOSIS — Z21 Asymptomatic human immunodeficiency virus [HIV] infection status: Secondary | ICD-10-CM | POA: Insufficient documentation

## 2023-05-23 DIAGNOSIS — Z5900 Homelessness unspecified: Secondary | ICD-10-CM | POA: Diagnosis not present

## 2023-05-23 LAB — COMPREHENSIVE METABOLIC PANEL
ALT: 31 U/L (ref 0–44)
AST: 33 U/L (ref 15–41)
Albumin: 3.2 g/dL — ABNORMAL LOW (ref 3.5–5.0)
Alkaline Phosphatase: 66 U/L (ref 38–126)
Anion gap: 10 (ref 5–15)
BUN: 11 mg/dL (ref 6–20)
CO2: 24 mmol/L (ref 22–32)
Calcium: 8.9 mg/dL (ref 8.9–10.3)
Chloride: 101 mmol/L (ref 98–111)
Creatinine, Ser: 0.91 mg/dL (ref 0.61–1.24)
GFR, Estimated: >60 mL/min (ref >60)
Glucose, Bld: 89 mg/dL (ref 70–99)
Potassium: 3.6 mmol/L (ref 3.5–5.1)
Sodium: 135 mmol/L (ref 135–145)
Total Bilirubin: 0.6 mg/dL (ref <1.2)
Total Protein: 8.4 g/dL — ABNORMAL HIGH (ref 6.5–8.1)

## 2023-05-23 LAB — CBC
HCT: 46.2 % (ref 39.0–52.0)
Hemoglobin: 14.9 g/dL (ref 13.0–17.0)
MCH: 26.5 pg (ref 26.0–34.0)
MCHC: 32.3 g/dL (ref 30.0–36.0)
MCV: 82.1 fL (ref 80.0–100.0)
Platelets: 414 10*3/uL — ABNORMAL HIGH (ref 150–400)
RBC: 5.63 MIL/uL (ref 4.22–5.81)
RDW: 16.2 % — ABNORMAL HIGH (ref 11.5–15.5)
WBC: 6.4 10*3/uL (ref 4.0–10.5)
nRBC: 0 % (ref 0.0–0.2)

## 2023-05-23 LAB — LIPASE, BLOOD: Lipase: 26 U/L (ref 11–51)

## 2023-05-23 MED ORDER — ONDANSETRON 4 MG PO TBDP
8.0000 mg | ORAL_TABLET | Freq: Once | ORAL | Status: AC
  Administered 2023-05-23: 8 mg via ORAL
  Filled 2023-05-23: qty 2

## 2023-05-23 MED ORDER — ONDANSETRON 8 MG PO TBDP
8.0000 mg | ORAL_TABLET | Freq: Three times a day (TID) | ORAL | 0 refills | Status: AC | PRN
  Filled 2023-05-23: qty 4, 2d supply, fill #0

## 2023-05-23 MED ORDER — KETOROLAC TROMETHAMINE 60 MG/2ML IM SOLN
60.0000 mg | Freq: Once | INTRAMUSCULAR | Status: AC
  Administered 2023-05-23: 60 mg via INTRAMUSCULAR
  Filled 2023-05-23: qty 2

## 2023-05-23 MED ORDER — FAMOTIDINE 20 MG PO TABS: 20.0000 mg | ORAL_TABLET | Freq: Two times a day (BID) | ORAL | 0 refills | Status: DC

## 2023-05-23 NOTE — ED Provider Notes (Signed)
[place] EMERGENCY DEPARTMENT AT [facility] Provider Note   CSN: [id number] Arrival date & time: 05/23/23  7829     History  Chief Complaint  Patient presents with   Abdominal Pain    Blake Chambers is a 32 y.o. male.  The history is provided by the patient.  Abdominal Pain Pain location:  Generalized Pain quality: cramping   Pain radiates to:  Does not radiate Onset quality:  Gradual Progression:  Waxing and waning Chronicity:  New Context: not recent sexual activity and not recent travel   Context comment:  Seen earlier and had enteritis on CT Relieved by:  Nothing Ineffective treatments:  None tried Associated symptoms: no anorexia, no belching, no chest pain, no constipation, no cough, no fever, no flatus, no hematochezia, no sore throat and no vomiting   Risk factors: no alcohol abuse, not elderly and no NSAID use   Patient with a h/o Bipolar presents with abdominal pain.  Seen for same earlier and had CT but symptoms persist.      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 Authorizing Provider  famotidine (PEPCID) 20 MG tablet Take 1 tablet (20 mg total) by mouth 2 (two) times daily. 05/23/23  Yes Emagene Merfeld, MD  ondansetron (ZOFRAN-ODT) 8 MG disintegrating tablet 8mg  ODT q8 hours prn nausea 05/23/23  Yes Janiyha Montufar, MD  acetaminophen (TYLENOL 8 HOUR) 650 MG CR tablet Take 1 tablet (650 mg total) by mouth every 8 (eight) hours as needed for pain. 04/18/22   Mannie Stabile, PA-C  bictegravir-emtricitabine-tenofovir AF (BIKTARVY) 50-200-25 MG TABS tablet Take 1 tablet by mouth daily. 10/27/22   Gailen Shelter, PA   cephALEXin (KEFLEX) 500 MG capsule Take 1 capsule (500 mg total) by mouth 3 (three) times daily. 10/21/22   Wallis Bamberg, PA-C  cyclobenzaprine (FLEXERIL) 10 MG tablet Take 1 tablet (10 mg total) by mouth 2 (two) times daily as needed for muscle spasms. 09/26/22   Carroll Sage, PA-C  ketoconazole (NIZORAL) 2 % cream Apply once daily to both feet for 2 weeks. 10/21/22   Wallis Bamberg, PA-C      Allergies    Pineapple, Tenofovir disoproxil, and Kiwi extract    Review of Systems   Review of Systems  Constitutional:  Negative for fever.  HENT:  Negative for facial swelling and sore throat.   Respiratory:  Negative for cough.   Cardiovascular:  Negative for chest pain.  Gastrointestinal:  Positive for abdominal pain. Negative for anorexia, constipation, flatus, hematochezia and vomiting.  All other systems reviewed and are negative.   Physical Exam Updated Vital Signs BP (!) 147/103 (BP Location: Right Arm)   Pulse 99   Temp 98.4 F (36.9 C)   Resp 18   SpO2 100%  Physical Exam Vitals and nursing note reviewed.  Constitutional:      General: He is not in acute distress.    Appearance: He is well-developed. He is not diaphoretic.  HENT:     Head: Normocephalic and atraumatic.     Nose: Nose normal.  Eyes:     Conjunctiva/sclera:  Conjunctivae normal.     Pupils: Pupils are equal, round, and reactive to light.  Cardiovascular:     Rate and Rhythm: Normal rate and regular rhythm.     Pulses: Normal pulses.     Heart sounds: Normal heart sounds.  Pulmonary:     Effort: Pulmonary effort is normal.     Breath sounds: Normal breath sounds. No wheezing or rales.  Abdominal:     General: Abdomen is flat. Bowel sounds are normal.     Palpations: Abdomen is soft.     Tenderness: There is no abdominal tenderness. There is no guarding or rebound. Negative signs include Murphy's sign, Rovsing's sign and McBurney's sign.  Musculoskeletal:        General: Normal range of motion.      Cervical back: Normal range of motion and neck supple.  Skin:    General: Skin is warm and dry.     Capillary Refill: Capillary refill takes less than 2 seconds.  Neurological:     General: No focal deficit present.     Mental Status: He is alert and oriented to person, place, and time.     Deep Tendon Reflexes: Reflexes normal.  Psychiatric:        Mood and Affect: Mood normal.     ED Results / Procedures / Treatments   Labs (all labs ordered are listed, but only abnormal results are displayed) Results for orders placed or performed during the hospital encounter of 05/23/23  Lipase, blood  Result Value Ref Range   Lipase 26 11 - 51 U/L  Comprehensive metabolic panel  Result Value Ref Range   Sodium 135 135 - 145 mmol/L   Potassium 3.6 3.5 - 5.1 mmol/L   Chloride 101 98 - 111 mmol/L   CO2 24 22 - 32 mmol/L   Glucose, Bld 89 70 - 99 mg/dL   BUN 11 6 - 20 mg/dL   Creatinine, Ser 5.28 0.61 - 1.24 mg/dL   Calcium 8.9 8.9 - 41.3 mg/dL   Total Protein 8.4 (H) 6.5 - 8.1 g/dL   Albumin 3.2 (L) 3.5 - 5.0 g/dL   AST 33 15 - 41 U/L   ALT 31 0 - 44 U/L   Alkaline Phosphatase 66 38 - 126 U/L   Total Bilirubin 0.6 <1.2 mg/dL   GFR, Estimated >24 >40 mL/min   Anion gap 10 5 - 15  CBC  Result Value Ref Range   WBC 6.4 4.0 - 10.5 K/uL   RBC 5.63 4.22 - 5.81 MIL/uL   Hemoglobin 14.9 13.0 - 17.0 g/dL   HCT 10.2 72.5 - 36.6 %   MCV 82.1 80.0 - 100.0 fL   MCH 26.5 26.0 - 34.0 pg   MCHC 32.3 30.0 - 36.0 g/dL   RDW 44.0 (H) 34.7 - 42.5 %   Platelets 414 (H) 150 - 400 K/uL   nRBC 0.0 0.0 - 0.2 %   CT ABDOMEN PELVIS W CONTRAST  Result Date: 05/22/2023 CLINICAL DATA:  Diffuse abdominal pain. EXAM: CT ABDOMEN AND PELVIS WITH CONTRAST TECHNIQUE: Multidetector CT imaging of the abdomen and pelvis was performed using the standard protocol following bolus administration of intravenous contrast. RADIATION DOSE REDUCTION: This exam was performed according to the departmental dose-optimization  program which includes automated exposure control, adjustment of the mA and/or kV according to patient size and/or use of iterative reconstruction technique. CONTRAST:  75mL OMNIPAQUE IOHEXOL 350 MG/ML SOLN COMPARISON:  August 24, 2022 FINDINGS: Lower chest: No acute abnormality.  Hepatobiliary: No focal liver abnormality is seen. No gallstones, gallbladder wall thickening, or biliary dilatation. Pancreas: Unremarkable. No pancreatic ductal dilatation or surrounding inflammatory changes. Spleen: Normal in size without focal abnormality. Adrenals/Urinary Tract: Adrenal glands are unremarkable. Kidneys are normal, without renal calculi, focal lesion, or hydronephrosis. Bladder is unremarkable. Stomach/Bowel: Stomach is within normal limits. Appendix appears normal. No evidence of bowel dilatation. Markedly thickened and inflamed small bowel loops are seen throughout the abdomen and pelvis. This involves predominantly the length of the jejunum and proximal ileum. Vascular/Lymphatic: No significant vascular findings are present. No enlarged abdominal or pelvic lymph nodes. Reproductive: Prostate is unremarkable. Other: No abdominal wall hernia or abnormality. No abdominopelvic ascites. Musculoskeletal: No acute or significant osseous findings. IMPRESSION: Markedly thickened and inflamed small bowel loops throughout the abdomen and pelvis, consistent with an infectious or inflammatory enteritis. Electronically Signed   By: Aram Candela M.D.   On: 05/22/2023 02:17   CT HEAD WO CONTRAST ( )  Result Date: 05/10/2023 CLINICAL DATA:  32 year old male found in parking lot. Altered mental status. EXAM: CT HEAD WITHOUT CONTRAST TECHNIQUE: Contiguous axial images were obtained from the base of the skull through the vertex without intravenous contrast. RADIATION DOSE REDUCTION: This exam was performed according to the departmental dose-optimization program which includes automated exposure control, adjustment of the mA  and/or kV according to patient size and/or use of iterative reconstruction technique. COMPARISON:  Head CT 08/24/2022. FINDINGS: Brain: Normal cerebral volume. Streak artifact related to the patient's right upper extremity position across the head. No midline shift, ventriculomegaly, mass effect, evidence of mass lesion, intracranial hemorrhage or evidence of cortically based acute infarction. Gray-white matter differentiation is within normal limits throughout the brain. Vascular: No suspicious intracranial vascular hyperdensity. Skull: Small chronic left lamina papyracea fracture. No acute osseous abnormality identified. Sinuses/Orbits: Visualized paranasal sinuses and mastoids are stable and well aerated. Other: Disconjugate gaze. Orbit and scalp soft tissues otherwise within normal limits. Poor dentition partially visible. IMPRESSION: 1. Stable and normal noncontrast CT appearance of the brain when allowing for streak artifact from the patient's right upper extremity. 2. No acute traumatic injury identified. 3. Poor dentition. Electronically Signed   By: Odessa Fleming M.D.   On: 05/10/2023 05:48     Radiology CT ABDOMEN PELVIS W CONTRAST  Result Date: 05/22/2023 CLINICAL DATA:  Diffuse abdominal pain. EXAM: CT ABDOMEN AND PELVIS WITH CONTRAST TECHNIQUE: Multidetector CT imaging of the abdomen and pelvis was performed using the standard protocol following bolus administration of intravenous contrast. RADIATION DOSE REDUCTION: This exam was performed according to the departmental dose-optimization program which includes automated exposure control, adjustment of the mA and/or kV according to patient size and/or use of iterative reconstruction technique. CONTRAST:  75mL OMNIPAQUE IOHEXOL 350 MG/ML SOLN COMPARISON:  August 24, 2022 FINDINGS: Lower chest: No acute abnormality. Hepatobiliary: No focal liver abnormality is seen. No gallstones, gallbladder wall thickening, or biliary dilatation. Pancreas: Unremarkable. No  pancreatic ductal dilatation or surrounding inflammatory changes. Spleen: Normal in size without focal abnormality. Adrenals/Urinary Tract: Adrenal glands are unremarkable. Kidneys are normal, without renal calculi, focal lesion, or hydronephrosis. Bladder is unremarkable. Stomach/Bowel: Stomach is within normal limits. Appendix appears normal. No evidence of bowel dilatation. Markedly thickened and inflamed small bowel loops are seen throughout the abdomen and pelvis. This involves predominantly the length of the jejunum and proximal ileum. Vascular/Lymphatic: No significant vascular findings are present. No enlarged abdominal or pelvic lymph nodes. Reproductive: Prostate is unremarkable. Other: No abdominal wall hernia or abnormality. No  abdominopelvic ascites. Musculoskeletal: No acute or significant osseous findings. IMPRESSION: Markedly thickened and inflamed small bowel loops throughout the abdomen and pelvis, consistent with an infectious or inflammatory enteritis. Electronically Signed   By: Aram Candela M.D.   On: 05/22/2023 02:17    Procedures Procedures    Medications Ordered in ED Medications  ondansetron (ZOFRAN-ODT) disintegrating tablet 8 mg (8 mg Oral Given 05/23/23 0457)  ketorolac (TORADOL) injection 60 mg (60 mg Intramuscular Given 05/23/23 0457)    ED Course/ Medical Decision Making/ A&P                                 Medical Decision Making Patient seen earlier and had full work up returns   Amount and/or Complexity of Data Reviewed Independent Historian: EMS    Details: See above  External Data Reviewed: labs, radiology and notes.    Details: Previous ED visit reviewed. Urine was negative earlier  Labs: ordered.    Details: Normal white count 6.4, normal hemoglobin 14.9, elevated platelets 414.  Normal sodium and potassium and creatinine 0.91, normal LFTs.  Normal lipase 26  Risk Prescription drug management. Risk Details: Well appearing normal exam, vital and  labs.  No change since seen earlier.  Stable for discharge.  RX sent to pharmacy.        Final Clinical Impression(s) / ED Diagnoses Final diagnoses:  Enteritis   Return for intractable cough, coughing up blood, fevers > 100.4 unrelieved by medication, shortness of breath, intractable vomiting, chest pain, shortness of breath, weakness, numbness, changes in speech, facial asymmetry, abdominal pain, passing out, Inability to tolerate liquids or food, cough, altered mental status or any concerns. No signs of systemic illness or infection. The patient is nontoxic-appearing on exam and vital signs are within normal limits.  I have reviewed the triage vital signs and the nursing notes. Pertinent labs & imaging results that were available during my care of the patient were reviewed by me and considered in my medical decision making (see chart for details). After history, exam, and medical workup I feel the patient has been appropriately medically screened and is safe for discharge home. Pertinent diagnoses were discussed with the patient. Patient was given return precautions.    Rx / DC Orders ED Discharge Orders          Ordered    ondansetron (ZOFRAN-ODT) 8 MG disintegrating tablet        05/23/23 0447    famotidine (PEPCID) 20 MG tablet  2 times daily        05/23/23 0447              Amia Rynders, MD 05/23/23 0510

## 2023-05-23 NOTE — ED Triage Notes (Signed)
Pt is coming in homeless, with [facility], with pain all over his abd. Lying on medic stretcher on his side with intermittent " grunting " to which he falls back asleep.   Medic Vitals   137/78 92%ra 94hr 18rr 93bgl

## 2023-05-26 ENCOUNTER — Other Ambulatory Visit (HOSPITAL_COMMUNITY): Payer: Self-pay

## 2023-05-27 ENCOUNTER — Other Ambulatory Visit: Payer: Self-pay

## 2023-05-27 ENCOUNTER — Emergency Department
Admission: EM | Admit: 2023-05-27 | Discharge: 2023-05-27 | Disposition: A | Discharge status: Home / Self Care | Payer: No Typology Code available for payment source | Attending: Emergency Medicine | Admitting: Emergency Medicine

## 2023-05-27 DIAGNOSIS — Z21 Asymptomatic human immunodeficiency virus [HIV] infection status: Secondary | ICD-10-CM | POA: Insufficient documentation

## 2023-05-27 DIAGNOSIS — X31XXXA Exposure to excessive natural cold, initial encounter: Secondary | ICD-10-CM | POA: Diagnosis not present

## 2023-05-27 DIAGNOSIS — T699XXA Effect of reduced temperature, unspecified, initial encounter: Secondary | ICD-10-CM | POA: Diagnosis present

## 2023-05-27 DIAGNOSIS — Z5900 Homelessness unspecified: Secondary | ICD-10-CM | POA: Diagnosis not present

## 2023-05-27 NOTE — ED Triage Notes (Signed)
EMS brings pt in from local [facility]; reports pain to extremities from "being cold"

## 2023-05-27 NOTE — ED Provider Notes (Signed)
[facility] Provider Note    None    (approximate)   History   No chief complaint on file.   HPI  Blake Chambers is a 32 y.o. male with PMH of anxiety, HIV, bipolar, syphilis and malingering presents for evaluation of cold exposure.  Patient was brought in by EMS from a [employer] due to pain in the extremities from being cold.  Patient is homeless.      Physical Exam   Triage Vital Signs: ED Triage Vitals  Encounter Vitals Group     BP 05/27/23 2019 (!) 140/100     Systolic BP Percentile --      Diastolic BP Percentile --      Pulse Rate 05/27/23 2019 100     Resp 05/27/23 2019 18     Temp 05/27/23 2019 98 F (36.7 C)     Temp Source 05/27/23 2019 Oral     SpO2 05/27/23 1943 98 %     Weight 05/27/23 2019 149 lb (67.6 kg)     Height 05/27/23 2019 5\' 9"  (1.753 m)     Head Circumference --      Peak Flow --      Pain Score 05/27/23 2018 0     Pain Loc --      Pain Education --      Exclude from Growth Chart --     Most recent vital signs: Vitals:   05/27/23 1943 05/27/23 2019  BP:  (!) 140/100  Pulse:  100  Resp:  18  Temp:  98 F (36.7 C)  SpO2: 98% 98%   General: Awake, no distress.  CV:  Good peripheral perfusion.  Resp:  Normal effort.  Abd:  No distention.  Other:  Skin on hands is warm and dry.  Capillary refill is appropriate, sensation intact across all dermatomes.  Skin on feet is cold and wet, skin has wrinkling from long exposure to water, capillary refill is appropriate, sensation intact across all dermatomes.     ED Results / Procedures / Treatments   Labs (all labs ordered are listed, but only abnormal results are displayed) Labs Reviewed - No data to display   PROCEDURES:  Critical Care performed: No  Procedures   MEDICATIONS ORDERED IN ED: Medications - No data to display   IMPRESSION / MDM / ASSESSMENT AND PLAN / ED COURSE  I reviewed the triage vital signs and the nursing notes.                              32 year old male presents for evaluation of cold exposure.  Patient was hypertensive in triage otherwise vital signs are stable.  Patient NAD on exam.  Differential diagnosis includes, but is not limited to, frostbite, wound infection, malingering.  Patient's presentation is most consistent with acute, uncomplicated illness.  Patient has good capillary refill and there are no blisters or numbness.  Patient's wet shoes and socks were removed.  I stuffed his shoes with paper towel to help them dry.  He was given 2 pairs of new socks to wear.  Feet were wrapped in a warm blanket. He is stable for discharge.  He voiced understanding, all questions were answered.  Patient states he doesn't have anywhere else to go. I spoke with the first nurse in triage who is agreeable to let patient spend the night in the waiting room until he can catch the bus in  the morning as long as he remains cooperative. Patient was made aware of this and is agreeable.     FINAL CLINICAL IMPRESSION(S) / ED DIAGNOSES   Final diagnoses:  Cold exposure, initial encounter     Rx / DC Orders   ED Discharge Orders     None        Note:  This document was prepared using Dragon voice recognition software and may include unintentional dictation errors.   Cameron Ali, PA-C 05/27/23 2147    Minna Antis, MD 05/27/23 [phone]

## 2023-05-27 NOTE — ED Triage Notes (Signed)
Pt reports he is homeless and his hands and feet hurt from being cold. Good cap refill in all extremities.

## 2023-05-31 ENCOUNTER — Emergency Department (HOSPITAL_COMMUNITY)
Admission: EM | Admit: 2023-05-31 | Discharge: 2023-05-31 | Disposition: A | Discharge status: Home / Self Care | Payer: No Typology Code available for payment source | Attending: Emergency Medicine | Admitting: Emergency Medicine

## 2023-05-31 ENCOUNTER — Other Ambulatory Visit: Payer: Self-pay

## 2023-05-31 DIAGNOSIS — M79671 Pain in right foot: Secondary | ICD-10-CM | POA: Diagnosis not present

## 2023-05-31 DIAGNOSIS — E162 Hypoglycemia, unspecified: Secondary | ICD-10-CM | POA: Insufficient documentation

## 2023-05-31 DIAGNOSIS — M79672 Pain in left foot: Secondary | ICD-10-CM | POA: Diagnosis present

## 2023-05-31 DIAGNOSIS — Z21 Asymptomatic human immunodeficiency virus [HIV] infection status: Secondary | ICD-10-CM | POA: Diagnosis not present

## 2023-05-31 DIAGNOSIS — Z5900 Homelessness unspecified: Secondary | ICD-10-CM | POA: Insufficient documentation

## 2023-05-31 DIAGNOSIS — N189 Chronic kidney disease, unspecified: Secondary | ICD-10-CM | POA: Insufficient documentation

## 2023-05-31 LAB — CBG MONITORING, ED
Glucose-Capillary: 65 mg/dL — ABNORMAL LOW (ref 70–99)
Glucose-Capillary: 57 mg/dL — ABNORMAL LOW (ref 70–99)
Glucose-Capillary: 121 mg/dL — ABNORMAL HIGH (ref 70–99)
Glucose-Capillary: 174 mg/dL — ABNORMAL HIGH (ref 70–99)
Glucose-Capillary: 26 mg/dL — CL (ref 70–99)
Glucose-Capillary: 57 mg/dL — ABNORMAL LOW (ref 70–99)
Glucose-Capillary: 77 mg/dL (ref 70–99)

## 2023-05-31 MED ORDER — DEXTROSE 50 % IV SOLN
INTRAVENOUS | Status: AC
  Filled 2023-05-31: qty 50

## 2023-05-31 MED ORDER — ACETAMINOPHEN 325 MG PO TABS
650.0000 mg | ORAL_TABLET | Freq: Once | ORAL | Status: AC
  Administered 2023-05-31: 650 mg via ORAL
  Filled 2023-05-31: qty 2

## 2023-05-31 NOTE — ED Provider Notes (Signed)
[place] EMERGENCY DEPARTMENT AT [facility] Provider Note   CSN: [id number] Arrival date & time: 05/31/23  0801     History  Chief Complaint  Patient presents with   Foot Pain    Bilateral foot pain    Edgardo Turlington is a 32 y.o. male with past medical history of bipolar disorder, syphilis, chronic kidney disease, substance abuse, HIV presenting to emergency room with complaints that both of his feet hurt he is tired and he is very cold. Will not provide further information. Reports he has been out of medications for 3 months. Denies associated symptoms. Patient able to ambulate.     Foot Pain       Home Medications Prior to Admission medications   Medication Sig Start Date End Date Taking? Authorizing Provider  acetaminophen (TYLENOL 8 HOUR) 650 MG CR tablet Take 1 tablet (650 mg total) by mouth every 8 (eight) hours as needed for pain. 04/18/22   Mannie Stabile, PA-C  bictegravir-emtricitabine-tenofovir AF (BIKTARVY) 50-200-25 MG TABS tablet Take 1 tablet by mouth daily. 10/27/22   Gailen Shelter, PA  cephALEXin (KEFLEX) 500 MG capsule Take 1 capsule (500 mg total) by mouth 3 (three) times daily. 10/21/22   Wallis Bamberg, PA-C  cyclobenzaprine (FLEXERIL) 10 MG tablet Take 1 tablet (10 mg total) by mouth 2 (two) times daily as needed for muscle spasms. 09/26/22   Carroll Sage, PA-C  famotidine (PEPCID) 20 MG tablet Take 1 tablet (20 mg total) by mouth 2 (two) times daily. 05/23/23   Palumbo, April, MD  ketoconazole (NIZORAL) 2 % cream Apply once daily to both feet for 2 weeks. 10/21/22   Wallis Bamberg, PA-C  ondansetron (ZOFRAN-ODT) 8 MG disintegrating tablet Take 1 tablet (8 mg total) by mouth every 8 (eight) hours as needed for nausea 05/23/23   Palumbo, April, MD      Allergies    Pineapple, Tenofovir disoproxil, and Kiwi extract    Review of Systems   Review of Systems  Physical Exam Updated Vital Signs BP (!) 114/92   Pulse 91   Temp (!) 97.2 F  (36.2 C) (Oral)   Resp 20   Ht 5\' 9"  (1.753 m)   Wt 68.9 kg   SpO2 99%   BMI 22.45 kg/m  Physical Exam Vitals and nursing note reviewed.  Constitutional:      General: He is not in acute distress.    Appearance: He is not toxic-appearing.  HENT:     Head: Normocephalic and atraumatic.  Eyes:     General: No scleral icterus.    Conjunctiva/sclera: Conjunctivae normal.  Cardiovascular:     Rate and Rhythm: Normal rate and regular rhythm.     Pulses: Normal pulses.     Heart sounds: Normal heart sounds.  Pulmonary:     Effort: Pulmonary effort is normal. No respiratory distress.     Breath sounds: Normal breath sounds.  Abdominal:     General: Abdomen is flat. Bowel sounds are normal.     Palpations: Abdomen is soft.     Tenderness: There is no abdominal tenderness.  Musculoskeletal:        General: No swelling, tenderness, deformity or signs of injury.     Right lower leg: No edema.     Left lower leg: No edema.     Comments: Feet without erythema, ecchymosis, point tenderness or wound. No sign of deformity. Strong dorsal pedal pulse, equal bilaterally.   Skin:    General:  Skin is warm and dry.     Capillary Refill: Capillary refill takes less than 2 seconds.     Findings: No lesion.  Neurological:     General: No focal deficit present.     Mental Status: He is alert and oriented to person, place, and time. Mental status is at baseline.     Cranial Nerves: No cranial nerve deficit.     Sensory: No sensory deficit.     Motor: No weakness.     Coordination: Coordination normal.     Gait: Gait normal.     ED Results / Procedures / Treatments   Labs (all labs ordered are listed, but only abnormal results are displayed) Labs Reviewed  CBG MONITORING, ED - Abnormal; Notable for the following components:      Result Value   Glucose-Capillary 65 (*)    All other components within normal limits  CBG MONITORING, ED - Abnormal; Notable for the following components:    Glucose-Capillary 57 (*)    All other components within normal limits  CBG MONITORING, ED - Abnormal; Notable for the following components:   Glucose-Capillary 26 (*)    All other components within normal limits  CBG MONITORING, ED - Abnormal; Notable for the following components:   Glucose-Capillary 57 (*)    All other components within normal limits  CBG MONITORING, ED - Abnormal; Notable for the following components:   Glucose-Capillary 174 (*)    All other components within normal limits    EKG None  Radiology No results found.  Procedures Procedures    Medications Ordered in ED Medications - No data to display  ED Course/ Medical Decision Making/ A&P                                 Medical Decision Making Risk OTC drugs.   This patient presents to the ED for concern of foot pain, this involves an extensive number of treatment options, and is a complaint that carries with it a high risk of complications and morbidity.  The differential diagnosis includes cellulitis, fracture, neuropathy, ulcer   Co morbidities that complicate the patient evaluation  bipolar disorder, syphilis, chronic kidney disease, substance abuse, HIV   Cardiac Monitoring: / EKG:  The patient was maintained on a cardiac monitor.     Problem List / ED Course / Critical interventions / Medication management  Patient reporting to emergency room with lower extremity pain bilaterally.  On physical exam no erythema deformity or point tenderness.  Patient ambulating with steady gait. Strong dorsal pedal pulse, good strength and good cap refill. Patient did have a blood sugar upon arrival which improved after juice and sandwich.  Patient has had recent visits to the emergency room for similar thing, feet pain. Given duration of symptoms and reassuring physical exam, I do not feel patient needs further workup in emergency room at this time.  Patient's initial blood glucose low tolerating eating and  drinking well recheck after he drinks juice.  Resting comfortably in room, no acute distress, normal vitals. Will re-assess. After repeat POC BG, BG has improved. Patient requesting Tylenol for pain, ordered. Stable for discharge.  I have reviewed the patients home medicines and have made adjustments as needed   Plan  F/u w/ PCP in 2-3d to ensure resolution of sx.  Patient was given return precautions. Patient stable for discharge at this time.  Patient educated on sx/dx and  verbalized understanding of plan. Return to ER w/ new or worsening sx.          Final Clinical Impression(s) / ED Diagnoses Final diagnoses:  Hypoglycemia  Homeless    Rx / DC Orders ED Discharge Orders     None         Reinaldo Raddle 05/31/23 1205    Jacalyn Lefevre, MD 05/31/23 1454

## 2023-05-31 NOTE — ED Notes (Signed)
PT GIVEN ORANGE JUICE AND BREAKFAST SANDWICH

## 2023-05-31 NOTE — ED Notes (Signed)
PA stated that she would D/C EKG.

## 2023-05-31 NOTE — ED Triage Notes (Signed)
Pt BIB [facility]. Per [facility] pt. found in lobby of [facility] looking for medication. Pt. States he has been out of his medications for a couple of months. Pt. C/o BLE pain. No noted bruising or swelling. Pt. States he has been walking around since yesterday.  BP 130/68 RR 22 SPO2 94 CBG 73

## 2023-05-31 NOTE — Discharge Instructions (Signed)
Please alternate Tylenol and ibuprofen for foot pain.  Follow-up with your primary care doctor.

## 2023-06-05 ENCOUNTER — Emergency Department (HOSPITAL_COMMUNITY)
Admission: EM | Admit: 2023-06-05 | Discharge: 2023-06-05 | Disposition: A | Discharge status: Home / Self Care | Payer: No Typology Code available for payment source | Attending: Emergency Medicine | Admitting: Emergency Medicine

## 2023-06-05 ENCOUNTER — Other Ambulatory Visit: Payer: Self-pay

## 2023-06-05 ENCOUNTER — Other Ambulatory Visit (HOSPITAL_COMMUNITY): Payer: Self-pay

## 2023-06-05 ENCOUNTER — Encounter (HOSPITAL_COMMUNITY): Payer: Self-pay

## 2023-06-05 DIAGNOSIS — Z5900 Homelessness unspecified: Secondary | ICD-10-CM | POA: Diagnosis not present

## 2023-06-05 DIAGNOSIS — M79673 Pain in unspecified foot: Secondary | ICD-10-CM | POA: Insufficient documentation

## 2023-06-05 DIAGNOSIS — G8929 Other chronic pain: Secondary | ICD-10-CM | POA: Diagnosis not present

## 2023-06-05 MED ORDER — IBUPROFEN 200 MG PO TABS
600.0000 mg | ORAL_TABLET | Freq: Once | ORAL | Status: AC
Start: 2023-06-05 — End: 2023-06-05
  Administered 2023-06-05: 600 mg via ORAL
  Filled 2023-06-05: qty 3

## 2023-06-05 NOTE — ED Triage Notes (Signed)
Pt arrived via [facility], picked up from [employer], c/o bilateral foot pain. BP 128/90 HR 90 O2: 99% RA Cbg 92 Temp: 99.9

## 2023-06-05 NOTE — Discharge Instructions (Signed)
Follow-up with your primary care physician.  If you develop fever, new or worsening pain, color change, or any other new/concerning symptoms then return to the ER or call 911.

## 2023-06-05 NOTE — ED Provider Notes (Signed)
[place] EMERGENCY DEPARTMENT AT [facility] Provider Note   CSN: [id number] Arrival date & time: 06/05/23  9563     History  No chief complaint on file.   Blake Chambers is a 32 y.o. male.  HPI 32 year old male presents with chronic bilateral foot pain.  Patient is homeless and has been here before for similar symptoms.  Patient denies any fevers or trauma.  The pain is primarily to his mid plantar foot.  Hurts mostly when he is walking.  He endorses this has been going on for months.  Home Medications Prior to Admission medications   Medication Sig Start Date End Date Taking? Authorizing Provider  acetaminophen (TYLENOL 8 HOUR) 650 MG CR tablet Take 1 tablet (650 mg total) by mouth every 8 (eight) hours as needed for pain. 04/18/22   Mannie Stabile, PA-C  bictegravir-emtricitabine-tenofovir AF (BIKTARVY) 50-200-25 MG TABS tablet Take 1 tablet by mouth daily. 10/27/22   Gailen Shelter, PA  cephALEXin (KEFLEX) 500 MG capsule Take 1 capsule (500 mg total) by mouth 3 (three) times daily. 10/21/22   Wallis Bamberg, PA-C  cyclobenzaprine (FLEXERIL) 10 MG tablet Take 1 tablet (10 mg total) by mouth 2 (two) times daily as needed for muscle spasms. 09/26/22   Carroll Sage, PA-C  famotidine (PEPCID) 20 MG tablet Take 1 tablet (20 mg total) by mouth 2 (two) times daily. 05/23/23   Palumbo, April, MD  ketoconazole (NIZORAL) 2 % cream Apply once daily to both feet for 2 weeks. 10/21/22   Wallis Bamberg, PA-C  ondansetron (ZOFRAN-ODT) 8 MG disintegrating tablet Take 1 tablet (8 mg total) by mouth every 8 (eight) hours as needed for nausea 05/23/23   Palumbo, April, MD      Allergies    Pineapple, Tenofovir disoproxil, and Kiwi extract    Review of Systems   Review of Systems  Constitutional:  Negative for fever.  Musculoskeletal:  Positive for arthralgias.    Physical Exam Updated Vital Signs BP (!) 129/98 (BP Location: Right Arm)   Pulse 83   Temp 97.7 F (36.5 C) (Oral)    Resp 18   Ht 5\' 9"  (1.753 m)   Wt 69 kg   SpO2 98%   BMI 22.46 kg/m  Physical Exam Vitals and nursing note reviewed.  Constitutional:      General: He is not in acute distress.    Appearance: He is well-developed. He is not ill-appearing or diaphoretic.  HENT:     Head: Normocephalic and atraumatic.  Cardiovascular:     Rate and Rhythm: Normal rate and regular rhythm.     Pulses:          Dorsalis pedis pulses are 2+ on the right side and 2+ on the left side.  Pulmonary:     Effort: Pulmonary effort is normal.  Abdominal:     General: There is no distension.  Musculoskeletal:     Right ankle: No tenderness. Normal range of motion.     Left ankle: No tenderness. Normal range of motion.     Right foot: No swelling or tenderness.     Left foot: No swelling or tenderness.     Comments: No cellulitis or swelling appreciated to either foot.  I am unable to reproduce any specific tenderness, especially to his plantar feet or heel.  Skin:    General: Skin is warm and dry.  Neurological:     Mental Status: He is alert.     ED Results /  Procedures / Treatments   Labs (all labs ordered are listed, but only abnormal results are displayed) Labs Reviewed - No data to display  EKG None  Radiology No results found.  Procedures Procedures    Medications Ordered in ED Medications - No data to display  ED Course/ Medical Decision Making/ A&P                                 Medical Decision Making Risk OTC drugs.   Patient with chronic foot pain.  In the past that seems to have been related to significant walking from being homeless and I suspect this is similarly related.  Could be neuropathy as well, but at this point with strong DP pulses, warm feet, and no signs of an obvious infection I do not think he needs further workup.  There has been no trauma and this is a chronic problem so I do not think x-ray would be helpful or needed.  At this point, he appears stable for  discharge.  Will give a dose of ibuprofen.        Final Clinical Impression(s) / ED Diagnoses Final diagnoses:  None    Rx / DC Orders ED Discharge Orders     None         Pricilla Loveless, MD 06/05/23 [phone]

## 2023-06-16 ENCOUNTER — Other Ambulatory Visit: Payer: Self-pay

## 2023-06-16 ENCOUNTER — Emergency Department (HOSPITAL_COMMUNITY)
Admission: EM | Admit: 2023-06-16 | Discharge: 2023-06-16 | Disposition: A | Discharge status: Home / Self Care | Payer: No Typology Code available for payment source | Attending: Emergency Medicine | Admitting: Emergency Medicine

## 2023-06-16 ENCOUNTER — Encounter (HOSPITAL_COMMUNITY): Payer: Self-pay

## 2023-06-16 DIAGNOSIS — M79672 Pain in left foot: Secondary | ICD-10-CM | POA: Diagnosis not present

## 2023-06-16 DIAGNOSIS — M79671 Pain in right foot: Secondary | ICD-10-CM | POA: Diagnosis present

## 2023-06-16 NOTE — Discharge Instructions (Signed)
You have chronic foot pain, you can take Tylenol or ibuprofen

## 2023-06-16 NOTE — ED Triage Notes (Signed)
Pt here for pain

## 2023-06-16 NOTE — ED Notes (Signed)
Pt will not talk during triage

## 2023-06-16 NOTE — ED Provider Notes (Signed)
 [place] EMERGENCY DEPARTMENT AT [facility] Provider Note   CSN: [id number] Arrival date & time: 06/16/23  9261     History  No chief complaint on file.   Blake Chambers is a 32 y.o. male.  HPI   This patient evidently is a homeless male, presenting with bilateral foot pain because he has been having to walk around, he has no other complaints, he arrives by EMS transport, the patient is not willing to give me very much information, he does not appear to be a danger to himself or others, he denies swelling bleeding or other injuries  Home Medications Prior to Admission medications   Medication Sig Start Date End Date Taking? Authorizing Provider  acetaminophen  (TYLENOL  8 HOUR) 650 MG CR tablet Take 1 tablet (650 mg total) by mouth every 8 (eight) hours as needed for pain. 04/18/22   Aberman, Caroline C, PA-C  bictegravir-emtricitabine -tenofovir  AF (BIKTARVY ) 50-200-25 MG TABS tablet Take 1 tablet by mouth daily. 10/27/22   Neldon Hamp RAMAN, PA  cephALEXin  (KEFLEX ) 500 MG capsule Take 1 capsule (500 mg total) by mouth 3 (three) times daily. 10/21/22   Christopher Savannah, PA-C  cyclobenzaprine  (FLEXERIL ) 10 MG tablet Take 1 tablet (10 mg total) by mouth 2 (two) times daily as needed for muscle spasms. 09/26/22   Waylan Elsie PARAS, PA-C  famotidine  (PEPCID ) 20 MG tablet Take 1 tablet (20 mg total) by mouth 2 (two) times daily. 05/23/23   Palumbo, April, MD  ketoconazole  (NIZORAL ) 2 % cream Apply once daily to both feet for 2 weeks. 10/21/22   Christopher Savannah, PA-C  ondansetron  (ZOFRAN -ODT) 8 MG disintegrating tablet Take 1 tablet (8 mg total) by mouth every 8 (eight) hours as needed for nausea 05/23/23   Palumbo, April, MD      Allergies    Pineapple, Tenofovir  disoproxil, and Kiwi extract    Review of Systems   Review of Systems  All other systems reviewed and are negative.   Physical Exam Updated Vital Signs There were no vitals taken for this visit. Physical Exam Vitals and  nursing note reviewed.  Constitutional:      Appearance: He is well-developed. He is not diaphoretic.  HENT:     Head: Normocephalic and atraumatic.  Eyes:     General:        Right eye: No discharge.        Left eye: No discharge.     Conjunctiva/sclera: Conjunctivae normal.  Pulmonary:     Effort: Pulmonary effort is normal. No respiratory distress.  Musculoskeletal:     Comments: No swelling or deformity of the legs knees or ankles,  Skin:    General: Skin is warm and dry.     Findings: No erythema or rash.  Neurological:     Mental Status: He is alert.     Coordination: Coordination normal.     Comments: The patient is talking quietly, when I ask him to talk louder he became upset, he is able to move all 4 extremities and has no cranial nerve deficits 3 through 12     ED Results / Procedures / Treatments   Labs (all labs ordered are listed, but only abnormal results are displayed) Labs Reviewed - No data to display  EKG None  Radiology No results found.  Procedures Procedures    Medications Ordered in ED Medications - No data to display  ED Course/ Medical Decision Making/ A&P  Medical Decision Making  This patient has multiple visits for minor complaints, I see no signs of trauma or injury, no need for imaging, no signs of significant skin breakdown, the patient is stable for discharge, I suspect there is some secondary gain the patient is homeless and it is cold outside        Final Clinical Impression(s) / ED Diagnoses Final diagnoses:  Pain in both feet    Rx / DC Orders ED Discharge Orders     None         Cleotilde Rogue, MD 06/16/23 [phone]

## 2023-07-24 ENCOUNTER — Emergency Department (HOSPITAL_COMMUNITY)
Admission: EM | Admit: 2023-07-24 | Discharge: 2023-07-24 | Disposition: A | Discharge status: Home / Self Care | Payer: No Typology Code available for payment source | Attending: Emergency Medicine | Admitting: Emergency Medicine

## 2023-07-24 ENCOUNTER — Other Ambulatory Visit: Payer: Self-pay

## 2023-07-24 ENCOUNTER — Encounter (HOSPITAL_COMMUNITY): Payer: Self-pay

## 2023-07-24 ENCOUNTER — Emergency Department (HOSPITAL_COMMUNITY): Payer: No Typology Code available for payment source

## 2023-07-24 ENCOUNTER — Other Ambulatory Visit (HOSPITAL_COMMUNITY): Payer: Self-pay

## 2023-07-24 DIAGNOSIS — Z5900 Homelessness unspecified: Secondary | ICD-10-CM | POA: Insufficient documentation

## 2023-07-24 DIAGNOSIS — Z21 Asymptomatic human immunodeficiency virus [HIV] infection status: Secondary | ICD-10-CM | POA: Diagnosis not present

## 2023-07-24 DIAGNOSIS — R519 Headache, unspecified: Secondary | ICD-10-CM | POA: Insufficient documentation

## 2023-07-24 LAB — CBG MONITORING, ED: Glucose-Capillary: 120 mg/dL — ABNORMAL HIGH (ref 70–99)

## 2023-07-24 LAB — DIFFERENTIAL
Abs Immature Granulocytes: 0.03 10*3/uL (ref 0.00–0.07)
Basophils Absolute: 0 10*3/uL (ref 0.0–0.1)
Basophils Relative: 0 %
Eosinophils Absolute: 0.1 10*3/uL (ref 0.0–0.5)
Eosinophils Relative: 1 %
Immature Granulocytes: 0 %
Lymphocytes Relative: 31 %
Lymphs Abs: 2.4 10*3/uL (ref 0.7–4.0)
Monocytes Absolute: 0.9 10*3/uL (ref 0.1–1.0)
Monocytes Relative: 11 %
Neutro Abs: 4.4 10*3/uL (ref 1.7–7.7)
Neutrophils Relative %: 57 %

## 2023-07-24 LAB — CBC
HCT: 44.4 % (ref 39.0–52.0)
Hemoglobin: 14.1 g/dL (ref 13.0–17.0)
MCH: 26.4 pg (ref 26.0–34.0)
MCHC: 31.8 g/dL (ref 30.0–36.0)
MCV: 83.1 fL (ref 80.0–100.0)
Platelets: 307 10*3/uL (ref 150–400)
RBC: 5.34 MIL/uL (ref 4.22–5.81)
RDW: 15.7 % — ABNORMAL HIGH (ref 11.5–15.5)
WBC: 7.8 10*3/uL (ref 4.0–10.5)
nRBC: 0 % (ref 0.0–0.2)

## 2023-07-24 LAB — CSF CELL COUNT WITH DIFFERENTIAL
RBC Count, CSF: 0 /mm3
Tube #: 4
WBC, CSF: 4 /mm3 (ref 0–5)

## 2023-07-24 LAB — MENINGITIS/ENCEPHALITIS PANEL (CSF)

## 2023-07-24 LAB — BASIC METABOLIC PANEL
Anion gap: 8 (ref 5–15)
BUN: 12 mg/dL (ref 6–20)
CO2: 23 mmol/L (ref 22–32)
Calcium: 9 mg/dL (ref 8.9–10.3)
Chloride: 102 mmol/L (ref 98–111)
Creatinine, Ser: 0.75 mg/dL (ref 0.61–1.24)
GFR, Estimated: >60 mL/min (ref >60)
Glucose, Bld: 76 mg/dL (ref 70–99)
Potassium: 4.8 mmol/L (ref 3.5–5.1)
Sodium: 133 mmol/L — ABNORMAL LOW (ref 135–145)

## 2023-07-24 LAB — CRYPTOCOCCAL ANTIGEN, CSF: Crypto Ag: NEGATIVE

## 2023-07-24 LAB — PROTEIN AND GLUCOSE, CSF
Glucose, CSF: 63 mg/dL (ref 40–70)
Total  Protein, CSF: 38 mg/dL (ref 15–45)

## 2023-07-24 MED ORDER — SODIUM CHLORIDE 0.9 % IV BOLUS
1000.0000 mL | Freq: Once | INTRAVENOUS | Status: AC
  Administered 2023-07-24: 1000 mL via INTRAVENOUS

## 2023-07-24 MED ORDER — ACETAMINOPHEN 500 MG PO TABS
1000.0000 mg | ORAL_TABLET | Freq: Once | ORAL | Status: AC
  Administered 2023-07-24: 1000 mg via ORAL
  Filled 2023-07-24: qty 2

## 2023-07-24 MED ORDER — KETOROLAC TROMETHAMINE 15 MG/ML IJ SOLN
15.0000 mg | Freq: Once | INTRAMUSCULAR | Status: AC
  Administered 2023-07-24: 15 mg via INTRAVENOUS
  Filled 2023-07-24: qty 1

## 2023-07-24 MED ORDER — METOCLOPRAMIDE HCL 5 MG/ML IJ SOLN
10.0000 mg | Freq: Once | INTRAMUSCULAR | Status: AC
  Administered 2023-07-24: 10 mg via INTRAVENOUS
  Filled 2023-07-24: qty 2

## 2023-07-24 MED ORDER — LIDOCAINE HCL (PF) 1 % IJ SOLN
5.0000 mL | Freq: Once | INTRAMUSCULAR | Status: DC
  Filled 2023-07-24: qty 30

## 2023-07-24 MED ORDER — BICTEGRAVIR-EMTRICITAB-TENOFOV 50-200-25 MG PO TABS
1.0000 | ORAL_TABLET | Freq: Every day | ORAL | 1 refills | Status: DC
  Filled 2023-07-24: qty 30, 30d supply, fill #0

## 2023-07-24 MED ORDER — LORAZEPAM 2 MG/ML IJ SOLN
1.0000 mg | Freq: Once | INTRAMUSCULAR | Status: AC
  Administered 2023-07-24: 1 mg via INTRAVENOUS
  Filled 2023-07-24: qty 1

## 2023-07-24 NOTE — ED Notes (Signed)
 Patient was able to ambulate to the bathroom and back to his room without assistance. Patient tolerated ambulation well.

## 2023-07-24 NOTE — ED Triage Notes (Signed)
 Pt homeless and BIBA after being picked up from a laundry mat with c/o of a migraine x2hrs ago. No hx of migraines per Pt.  HR 117 BP 130(palpated) 99%RA

## 2023-07-24 NOTE — Discharge Instructions (Signed)
 I set up an appointment with the infectious disease doctor for further 12th at 2 PM.  You need to follow-up regarding your HIV and restart your tardy medication.  If you develop worsening headache, fever or any other new concerning symptoms you should return to the ED.

## 2023-07-24 NOTE — ED Provider Notes (Signed)
 [place] EMERGENCY DEPARTMENT AT [facility] Provider Note   CSN: [id number] Arrival date & time: 07/24/23  0310     History  Chief Complaint  Patient presents with   Migraine    Blake Chambers is a 33 y.o. male.   Migraine  33 year old male history of HIV and homelessness presenting for headache.  Patient states headache started about 3 hours ago.  It is frontal, was relatively quick onset.  Has not had a headache like this before.  No headaches prior to this.  No neck pain or stiffness.  Has not had any head trauma.  No fevers or chills or vomiting.  No vision changes or weakness or numbness.  Patient endorses some photophobia.     Home Medications Prior to Admission medications   Medication Sig Start Date End Date Taking? Authorizing Provider  acetaminophen  (TYLENOL  8 HOUR) 650 MG CR tablet Take 1 tablet (650 mg total) by mouth every 8 (eight) hours as needed for pain. 04/18/22   Aberman, Caroline C, PA-C  bictegravir-emtricitabine -tenofovir  AF (BIKTARVY ) 50-200-25 MG TABS tablet Take 1 tablet by mouth daily. 07/24/23   Blake Levitz H, MD  cephALEXin  (KEFLEX ) 500 MG capsule Take 1 capsule (500 mg total) by mouth 3 (three) times daily. 10/21/22   Blake Savannah, PA-C  cyclobenzaprine  (FLEXERIL ) 10 MG tablet Take 1 tablet (10 mg total) by mouth 2 (two) times daily as needed for muscle spasms. 09/26/22   Blake Elsie PARAS, PA-C  famotidine  (PEPCID ) 20 MG tablet Take 1 tablet (20 mg total) by mouth 2 (two) times daily. 05/23/23   Chambers, April, MD  ketoconazole  (NIZORAL ) 2 % cream Apply once daily to both feet for 2 weeks. 10/21/22   Blake Savannah, PA-C  ondansetron  (ZOFRAN -ODT) 8 MG disintegrating tablet Take 1 tablet (8 mg total) by mouth every 8 (eight) hours as needed for nausea 05/23/23   Chambers, April, MD      Allergies    Blueberry flavoring agent (non-screening), Pineapple, Tenofovir  disoproxil, and Kiwi extract    Review of Systems   Review of Systems Review of  systems completed and notable as per HPI.  ROS otherwise negative.   Physical Exam Updated Vital Signs BP 122/85   Pulse 83   Temp 97.6 F (36.4 C) (Oral)   Resp 16   Ht 5' 9 (1.753 Chambers)   Wt 64.4 kg   SpO2 100%   BMI 20.97 kg/Chambers  Physical Exam Vitals and nursing note reviewed.  Constitutional:      General: He is not in acute distress.    Appearance: He is well-developed.  HENT:     Head: Normocephalic and atraumatic.     Nose: Nose normal.     Mouth/Throat:     Mouth: Mucous membranes are moist.     Pharynx: Oropharynx is clear.  Eyes:     Extraocular Movements: Extraocular movements intact.     Conjunctiva/sclera: Conjunctivae normal.     Pupils: Pupils are equal, round, and reactive to light.  Cardiovascular:     Rate and Rhythm: Normal rate and regular rhythm.     Pulses: Normal pulses.     Heart sounds: Normal heart sounds. No murmur heard. Pulmonary:     Effort: Pulmonary effort is normal. No respiratory distress.     Breath sounds: Normal breath sounds.  Abdominal:     Palpations: Abdomen is soft.     Tenderness: There is no abdominal tenderness.  Musculoskeletal:  General: No swelling.     Cervical back: Normal range of motion and neck supple. No rigidity or tenderness.  Skin:    General: Skin is warm and dry.     Capillary Refill: Capillary refill takes less than 2 seconds.  Neurological:     General: No focal deficit present.     Mental Status: He is alert and oriented to person, place, and time. Mental status is at baseline.     Cranial Nerves: No cranial nerve deficit.     Sensory: No sensory deficit.     Motor: No weakness.     Coordination: Coordination normal.     Gait: Gait normal.  Psychiatric:        Mood and Affect: Mood normal.     ED Results / Procedures / Treatments   Labs (all labs ordered are listed, but only abnormal results are displayed) Labs Reviewed  BASIC METABOLIC PANEL - Abnormal; Notable for the following components:       Result Value   Sodium 133 (*)    All other components within normal limits  CBC - Abnormal; Notable for the following components:   RDW 15.7 (*)    All other components within normal limits  CSF CELL COUNT WITH DIFFERENTIAL - Abnormal; Notable for the following components:   Color, CSF CLEAR (*)    All other components within normal limits  CBG MONITORING, ED - Abnormal; Notable for the following components:   Glucose-Capillary 120 (*)    All other components within normal limits  CSF CULTURE W GRAM STAIN  DIFFERENTIAL  PROTEIN AND GLUCOSE, CSF  CRYPTOCOCCAL ANTIGEN, CSF  MENINGITIS/ENCEPHALITIS PANEL (CSF)  T-HELPER CELLS (CD4) COUNT (NOT AT [facility])    EKG None  Radiology CT Head Wo Contrast Result Date: 07/24/2023 CLINICAL DATA:  Headache, increasing frequency or severity EXAM: CT HEAD WITHOUT CONTRAST TECHNIQUE: Contiguous axial images were obtained from the base of the skull through the vertex without intravenous contrast. RADIATION DOSE REDUCTION: This exam was performed according to the departmental dose-optimization program which includes automated exposure control, adjustment of the mA and/or kV according to patient size and/or use of iterative reconstruction technique. COMPARISON:  Head CT 05/10/2023 FINDINGS: Brain: No evidence of acute infarction, hemorrhage, hydrocephalus, extra-axial collection or mass lesion/mass effect. Vascular: No hyperdense vessel or unexpected calcification. Skull: Normal. Negative for fracture or focal lesion. Sinuses/Orbits: No middle ear or mastoid effusion. Paranasal sinuses are clear. Orbits unremarkable. Other: None. IMPRESSION: No CT etiology for headaches identified. Electronically Signed   By: Blake ChambersD.   On: 07/24/2023 08:38    Procedures Lumbar Puncture  Date/Time: 07/24/2023 2:01 PM  Performed by: Blake Cassondra DEL, MD Authorized by: Blake Cassondra DEL, MD   Consent:    Consent obtained:  Written and verbal   Consent given  by:  Patient   Risks, benefits, and alternatives were discussed: yes     Risks discussed:  Bleeding, headache, infection, nerve damage, repeat procedure and pain   Alternatives discussed:  No treatment Universal protocol:    Patient identity confirmed:  Verbally with patient Pre-procedure details:    Procedure purpose:  Diagnostic   Preparation: Patient was prepped and draped in usual sterile fashion   Anesthesia:    Anesthesia method:  Local infiltration   Local anesthetic:  Lidocaine  1% w/o epi Procedure details:    Lumbar space:  L4-L5 interspace   Patient position:  L lateral decubitus   Needle gauge:  20   Needle type:  Spinal needle - Quincke tip   Needle length (in):  2.5   Ultrasound guidance: no     Number of attempts:  1   Opening pressure (cm H2O):  16   Fluid appearance:  Clear   Tubes of fluid:  4   Total volume (ml):  5 Post-procedure details:    Puncture site:  Adhesive bandage applied   Procedure completion:  Tolerated well, no immediate complications     Medications Ordered in ED Medications  lidocaine  (PF) (XYLOCAINE ) 1 % injection 5 mL (5 mLs Other Not Given 07/24/23 1101)  metoCLOPramide  (REGLAN ) injection 10 mg (10 mg Intravenous Given 07/24/23 0846)  sodium chloride  0.9 % bolus 1,000 mL (0 mLs Intravenous Stopped 07/24/23 1049)  acetaminophen  (TYLENOL ) tablet 1,000 mg (1,000 mg Oral Given 07/24/23 0846)  ketorolac  (TORADOL ) 15 MG/ML injection 15 mg (15 mg Intravenous Given 07/24/23 1009)  LORazepam  (ATIVAN ) injection 1 mg (1 mg Intravenous Given 07/24/23 1016)    ED Course/ Medical Decision Making/ A&P Clinical Course as of 07/24/23 1502  Fri Jul 24, 2023  0941 Positive RPR and treponemal antibodies in November.  Last CD4 in December 2023 was 326. [JD]  [id number] ID Blake Chambers: LP, drop below 20 ICP, get opening pressure, cryptococcal antigen, if negative could DC [JD]  1058 OP 17 [JD]    Clinical Course User Index [JD] Blake Cassondra DEL, MD                                  Medical Decision Making Amount and/or Complexity of Data Reviewed Labs: ordered. Radiology: ordered.  Risk OTC drugs. Prescription drug management.   Medical Decision Making:   Anup Brigham is a 33 y.o. male who presented to the ED today with new onset headache.  He was reportedly tachycardic with EMS although here he is not tachycardic, afebrile.  Is overall well-appearing.  Normal neurologic exam.  Headache is acute today, within the last 3 hours.  No trauma.  Obtain CT head to rule out mass, intracranial bleeding.  He has no meningismus or signs of CNS infection at this time.  Does have history of HIV on the last CD4 count was over 300.   Patient placed on continuous vitals and telemetry monitoring while in ED which was reviewed periodically.  Reviewed and confirmed nursing documentation for past medical history, family history, social history.  Reassessment and Plan:   CT reviewed, no acute findings clear no mass or evidence of bleeding.  Lab work is overall quite reassuring.  CD4 count is pending.  I spoke with Dr. Lindia with infectious disease.  He recommends starting LP with opening pressure to rule out cryptococcal meningitis.  If negative could follow-up outpatient.  LP performed as above.  Opening pressure already less than 20, CSF count not consistent with bacterial meningitis.  Cryptococcal antigen is negative.  Meningitis panel is negative as well.  Patient required some Ativan  for anxiety during the procedure.  He is somewhat sleepy from this but arousable without deficit.  I spoke with Dr. Lindia, he recommends outpatient follow-up and set up an appointment for the patient as well as restarting his Biktarvy .  On reassessment headache is resolved.  He is tolerating p.o. and ambulating without difficulty.  He states he feels tired because he has not been sleeping well but is currently staying in a shelter.  He is workup overall reassuring.  No signs of opportunistic  infection.  I discussed with him that he is at high risk given HIV with unknown CD4 count which is pending for optimistic infection.  Given strict return precautions for fever, worsening headache or any other new symptoms.  He has an appointment for next week already made with infectious disease, I gave him the address, information and emphasized importance of him following up with this.  He is comfortable with this plan.  Discharged in stable condition with strict return precautions.  Prescription refill for Biktarvy  given.   Patient's presentation is most consistent with acute complicated illness / injury requiring diagnostic workup.         Final Clinical Impression(s) / ED Diagnoses Final diagnoses:  Acute nonintractable headache, unspecified headache type    Rx / DC Orders ED Discharge Orders          Ordered    bictegravir-emtricitabine -tenofovir  AF (BIKTARVY ) 50-200-25 MG TABS tablet  Daily        07/24/23 1405              Daryl Beehler H, MD 07/24/23 1502

## 2023-07-24 NOTE — ED Notes (Signed)
 LP was successfully done and fluid was walked to the lab and handed to lab tech.

## 2023-07-27 LAB — CSF CULTURE W GRAM STAIN
Culture: NO GROWTH
Gram Stain: NONE SEEN

## 2023-07-27 LAB — T-HELPER CELLS (CD4) COUNT (NOT AT ARMC)
CD4 % Helper T Cell: 24 % — ABNORMAL LOW (ref 33–65)
CD4 T Cell Abs: 520 /uL (ref 400–1790)

## 2023-07-29 ENCOUNTER — Telehealth: Payer: Self-pay

## 2023-07-29 ENCOUNTER — Ambulatory Visit: Payer: No Typology Code available for payment source | Admitting: Internal Medicine

## 2023-07-29 NOTE — Telephone Encounter (Signed)
Attempted to call patient regarding missed appt. Left vm requesting call back. Not in care since 2013. Adding patient to out of care for follow up. Will send mychart message. Juanita Laster, RMA

## 2023-08-07 ENCOUNTER — Telehealth: Payer: Self-pay

## 2023-08-07 ENCOUNTER — Other Ambulatory Visit: Payer: Self-pay

## 2023-08-07 ENCOUNTER — Other Ambulatory Visit (HOSPITAL_COMMUNITY): Payer: Self-pay

## 2023-08-07 NOTE — Telephone Encounter (Signed)
 Patient considered out of care.  Last [facility] Visit: 11/01/21  Last HIV Viral Load:  HIV 1 RNA Quant  Date Value Ref Range Status  05/19/2022 1,920 (H) Copies/mL Final    Last CD4 Count:  CD4 T Cell Abs  Date Value Ref Range Status  07/24/2023 520 400 - 1,790 /uL Final    Medication Dispense History:   Dispensed Days Supply Quantity Provider Pharmacy  BIKTARVY 50/200/25MG  TABLETS 04/08/2023 30 30 each Vickey Sages, FNP [facility]...  BIKTARVY 50-200-25MG  TAB 03/05/2023 30 30 each Gailen Shelter, PA [facility]...  bictegravir-emtricitabine-tenofovir AF (BIKTARVY) 50-200-25 MG TABS tablet 12/12/2021 30 30 tablet Comer, Belia Heman, MD [facility]  bictegravir-emtricitabine-tenofovir AF (BIKTARVY) 50-200-25 MG TABS tablet 11/04/2021 30 30 tablet Comer, Belia Heman, MD [facility]    Interventions: called [facility], no answer. Left HIPAA compliant voicemail requesting callback.   Sandie Ano, RN

## 2023-08-22 ENCOUNTER — Emergency Department (HOSPITAL_COMMUNITY)
Admission: EM | Admit: 2023-08-22 | Discharge: 2023-08-22 | Disposition: A | Discharge status: Home / Self Care | Attending: Emergency Medicine | Admitting: Emergency Medicine

## 2023-08-22 ENCOUNTER — Other Ambulatory Visit: Payer: Self-pay

## 2023-08-22 ENCOUNTER — Encounter (HOSPITAL_COMMUNITY): Payer: Self-pay

## 2023-08-22 DIAGNOSIS — J210 Acute bronchiolitis due to respiratory syncytial virus: Secondary | ICD-10-CM | POA: Insufficient documentation

## 2023-08-22 DIAGNOSIS — R059 Cough, unspecified: Secondary | ICD-10-CM | POA: Diagnosis present

## 2023-08-22 DIAGNOSIS — R000 Tachycardia, unspecified: Secondary | ICD-10-CM | POA: Insufficient documentation

## 2023-08-22 LAB — RESP PANEL BY RT-PCR (RSV, FLU A&B, COVID)  RVPGX2
Influenza A by PCR: NEGATIVE
Influenza B by PCR: NEGATIVE
Resp Syncytial Virus by PCR: POSITIVE — AB
SARS Coronavirus 2 by RT PCR: NEGATIVE

## 2023-08-22 MED ORDER — AZITHROMYCIN 250 MG PO TABS: 250.0000 mg | ORAL_TABLET | Freq: Every day | ORAL | 0 refills | Status: DC

## 2023-08-22 MED ORDER — BENZONATATE 100 MG PO CAPS
100.0000 mg | ORAL_CAPSULE | Freq: Once | ORAL | Status: AC
  Administered 2023-08-22: 100 mg via ORAL
  Filled 2023-08-22: qty 1

## 2023-08-22 MED ORDER — IPRATROPIUM BROMIDE 0.03 % NA SOLN: 2.0000 | Freq: Two times a day (BID) | NASAL | 0 refills | Status: DC

## 2023-08-22 MED ORDER — BENZONATATE 100 MG PO CAPS: 100.0000 mg | ORAL_CAPSULE | Freq: Two times a day (BID) | ORAL | 0 refills | Status: DC | PRN

## 2023-08-22 NOTE — ED Provider Notes (Signed)
 [facility]Y DEPT [facility] Emergency Department Provider Note MRN:  [id number]  Arrival date & time: 08/22/23     Chief Complaint   Shortness of Breath   History of Present Illness   Blake Chambers is a 33 y.o. year-old male presents to the ED with chief complaint of cough and congestion.  States that he is been sick for the past couple of days.  He reports significant sinus congestion.  Denies measured fever.  Denies successful treatments prior to arrival.  States he has had some associated SOB associated with his cough.  History provided by patient.   Review of Systems  Pertinent positive and negative review of systems noted in HPI.    Physical Exam   Vitals:   08/22/23 0448  BP: 123/80  Pulse: (!) 115  Resp: 19  Temp: 98.9 F (37.2 C)  SpO2: 98%    CONSTITUTIONAL:  non toxic-appearing, NAD NEURO:  Alert and oriented x 3, CN 3-12 grossly intact EYES:  eyes equal and reactive ENT/NECK:  Supple, no stridor, congested CARDIO:  tachycardic, regular rhythm, appears well-perfused  PULM:  No respiratory distress, CTAB GI/GU:  non-distended,  MSK/SPINE:  No gross deformities, no edema, moves all extremities  SKIN:  no rash, atraumatic   *Additional and/or pertinent findings included in MDM below  Diagnostic and Interventional Summary    EKG Interpretation Date/Time:    Ventricular Rate:    PR Interval:    QRS Duration:    QT Interval:    QTC Calculation:   R Axis:      Text Interpretation:         Labs Reviewed  RESP PANEL BY RT-PCR (RSV, FLU A&B, COVID)  RVPGX2 - Abnormal; Notable for the following components:      Result Value   Resp Syncytial Virus by PCR POSITIVE (*)    All other components within normal limits    No orders to display    Medications  benzonatate (TESSALON) capsule 100 mg (has no administration in time range)     Procedures  /  Critical Care Procedures  ED Course and Medical Decision Making  I have reviewed the triage  vital signs, the nursing notes, and pertinent available records from the EMR.  Social Determinants Affecting Complexity of Care: Patient has no clinically significant social determinants affecting this chief complaint..   ED Course:    Medical Decision Making Patient here with cough and congestion.  Afebrile.  RSV positive.  No hypoxia.  Hx of HIV.  Report productive cough.  Might benefit from a z-pak.  Will also give tessalon and atrovent nasal spray.  Return precautions given.  He appears stable for discharge.  Risk Prescription drug management.         Consultants: No consultations were needed in caring for this patient.   Treatment and Plan: Emergency department workup does not suggest an emergent condition requiring admission or immediate intervention beyond  what has been performed at this time. The patient is safe for discharge and has  been instructed to return immediately for worsening symptoms, change in  symptoms or any other concerns    Final Clinical Impressions(s) / ED Diagnoses     ICD-10-CM   1. RSV (acute bronchiolitis due to respiratory syncytial virus)  J21.0       ED Discharge Orders          Ordered    azithromycin (ZITHROMAX) 250 MG tablet  Daily        08/22/23 0611  benzonatate (TESSALON) 100 MG capsule  2 times daily PRN        08/22/23 0611    ipratropium (ATROVENT) 0.03 % nasal spray  Every 12 hours        08/22/23 1610              Discharge Instructions Discussed with and Provided to Patient:   Discharge Instructions   None      Roxy Horseman, PA-C 08/22/23 9604    Royanne Foots, DO 08/27/23 1440

## 2023-08-22 NOTE — ED Triage Notes (Signed)
 Pt came in via EMS from home w/ c/o SOB and a cough for a couple of days. Pt believes they may have bronchitis. Denies N/V/D and fever. Endorses congestion and a productive cough of green sputum.   EMS O2 100% RA

## 2023-08-26 ENCOUNTER — Telehealth: Payer: Self-pay

## 2023-08-26 NOTE — Telephone Encounter (Signed)
Called patient to schedule appointment, no answer. Left HIPAA compliant voicemail requesting callback.   Kohei Antonellis D Himmat Enberg, RN  

## 2023-08-26 NOTE — Progress Notes (Signed)
 [facility] Bridge Counselor Referral Notice  Attempts to reach patient to re-engage in ID clinic care have not been successful. Patient is being referred to [facility] Counselor for assistance in re-engaging in care.   Last HIV Viral Load:  HIV 1 RNA Quant  Date Value Ref Range Status  05/19/2022 1,920 (H) Copies/mL Final    Last CD4 Count:  CD4 T Cell Abs  Date Value Ref Range Status  07/24/2023 520 400 - 1,790 /uL Final   Medication Dispense History:  Dispensed Days Supply Quantity Provider Pharmacy  BIKTARVY 50/200/25MG  TABLETS 04/08/2023 30 30 each Vickey Sages, FNP [facility]  BIKTARVY 50-200-25MG  TAB 03/05/2023 30 30 each Gailen Shelter, PA [facility]...  bictegravir-emtricitabine-tenofovir AF (BIKTARVY) 50-200-25 MG TABS tablet 12/12/2021 30 30 tablet Comer, Belia Heman, MD [facility]...    Last [facility] Visit: 11/01/21   Phone Call Attempts:  Attempt Date  07/29/23  08/07/23  08/26/23    MyChart Attempts:  Attempt Date  03/19/22  07/29/23    Letter Mailed: unable to mail letter, address on file is for [facility]   Duration of Service: 5 minutes  Sandie Ano, RN

## 2023-09-01 ENCOUNTER — Emergency Department (HOSPITAL_COMMUNITY)
Admission: EM | Admit: 2023-09-01 | Discharge: 2023-09-01 | Disposition: A | Discharge status: Home / Self Care | Attending: Emergency Medicine | Admitting: Emergency Medicine

## 2023-09-01 ENCOUNTER — Encounter (HOSPITAL_COMMUNITY): Payer: Self-pay

## 2023-09-01 DIAGNOSIS — B974 Respiratory syncytial virus as the cause of diseases classified elsewhere: Secondary | ICD-10-CM | POA: Insufficient documentation

## 2023-09-01 DIAGNOSIS — N182 Chronic kidney disease, stage 2 (mild): Secondary | ICD-10-CM | POA: Diagnosis not present

## 2023-09-01 DIAGNOSIS — R093 Abnormal sputum: Secondary | ICD-10-CM | POA: Insufficient documentation

## 2023-09-01 DIAGNOSIS — B338 Other specified viral diseases: Secondary | ICD-10-CM

## 2023-09-01 DIAGNOSIS — Z21 Asymptomatic human immunodeficiency virus [HIV] infection status: Secondary | ICD-10-CM | POA: Diagnosis not present

## 2023-09-01 LAB — RESP PANEL BY RT-PCR (RSV, FLU A&B, COVID)  RVPGX2
Influenza A by PCR: NEGATIVE
Influenza B by PCR: NEGATIVE
Resp Syncytial Virus by PCR: POSITIVE — AB
SARS Coronavirus 2 by RT PCR: NEGATIVE

## 2023-09-01 NOTE — ED Triage Notes (Signed)
 Pt roommate is sick, pt c/o cold and flu s/s x 2-3 days

## 2023-09-01 NOTE — Discharge Instructions (Signed)
 You were diagnosed today with RSV.  This is typically a self-limiting viral illness similar to a cold.  You may take over-the-counter medications at home for symptom relief as needed.  Follow-up with your primary care provider as needed.

## 2023-09-01 NOTE — ED Provider Notes (Signed)
 [place] EMERGENCY DEPARTMENT AT [facility] Provider Note   CSN: [id number] Arrival date & time: 09/01/23  0236     History  Chief Complaint  Patient presents with   URI    Blake Chambers is a 33 y.o. male.  Patient complains of 1 day of phlegm.  He denies shortness of breath, chest pain, abdominal pain, nausea, vomiting.  He reports a roommate with similar symptoms.  Past medical history significant for stage II chronic renal insufficiency, bipolar 1, HIV   URI      Home Medications Prior to Admission medications   Medication Sig Start Date End Date Taking? Authorizing Provider  acetaminophen (TYLENOL 8 HOUR) 650 MG CR tablet Take 1 tablet (650 mg total) by mouth every 8 (eight) hours as needed for pain. 04/18/22   Mannie Stabile, PA-C  azithromycin (ZITHROMAX) 250 MG tablet Take 1 tablet (250 mg total) by mouth daily. Take first 2 tablets together, then 1 every day until finished. 08/22/23   Roxy Horseman, PA-C  benzonatate (TESSALON) 100 MG capsule Take 1 capsule (100 mg total) by mouth 2 (two) times daily as needed for cough. 08/22/23   Roxy Horseman, PA-C  bictegravir-emtricitabine-tenofovir AF (BIKTARVY) 50-200-25 MG TABS tablet Take 1 tablet by mouth daily. 07/24/23   Laurence Spates, MD  cephALEXin (KEFLEX) 500 MG capsule Take 1 capsule (500 mg total) by mouth 3 (three) times daily. 10/21/22   Wallis Bamberg, PA-C  cyclobenzaprine (FLEXERIL) 10 MG tablet Take 1 tablet (10 mg total) by mouth 2 (two) times daily as needed for muscle spasms. 09/26/22   Carroll Sage, PA-C  famotidine (PEPCID) 20 MG tablet Take 1 tablet (20 mg total) by mouth 2 (two) times daily. 05/23/23   Palumbo, April, MD  ipratropium (ATROVENT) 0.03 % nasal spray Place 2 sprays into both nostrils every 12 (twelve) hours. 08/22/23   Roxy Horseman, PA-C  ketoconazole (NIZORAL) 2 % cream Apply once daily to both feet for 2 weeks. 10/21/22   Wallis Bamberg, PA-C  ondansetron (ZOFRAN-ODT) 8 MG  disintegrating tablet Take 1 tablet (8 mg total) by mouth every 8 (eight) hours as needed for nausea 05/23/23   Palumbo, April, MD      Allergies    Blueberry flavoring agent (non-screening), Pineapple, Tenofovir disoproxil, and Kiwi extract    Review of Systems   Review of Systems  Physical Exam Updated Vital Signs BP 120/72 (BP Location: Left Arm)   Pulse 86   Temp 97.8 F (36.6 C) (Oral)   Resp 20   SpO2 100%  Physical Exam Vitals and nursing note reviewed.  Constitutional:      General: He is not in acute distress.    Appearance: He is well-developed.  HENT:     Head: Normocephalic and atraumatic.  Eyes:     Conjunctiva/sclera: Conjunctivae normal.  Cardiovascular:     Rate and Rhythm: Normal rate and regular rhythm.  Pulmonary:     Effort: Pulmonary effort is normal. No respiratory distress.     Breath sounds: Normal breath sounds.  Musculoskeletal:        General: No swelling.     Cervical back: Neck supple.     Right lower leg: No edema.     Left lower leg: No edema.  Skin:    General: Skin is warm and dry.     Capillary Refill: Capillary refill takes less than 2 seconds.  Neurological:     Mental Status: He is alert.  Psychiatric:  Mood and Affect: Mood normal.     ED Results / Procedures / Treatments   Labs (all labs ordered are listed, but only abnormal results are displayed) Labs Reviewed  RESP PANEL BY RT-PCR (RSV, FLU A&B, COVID)  RVPGX2 - Abnormal; Notable for the following components:      Result Value   Resp Syncytial Virus by PCR POSITIVE (*)    All other components within normal limits    EKG None  Radiology No results found.  Procedures Procedures    Medications Ordered in ED Medications - No data to display  ED Course/ Medical Decision Making/ A&P                                 Medical Decision Making  Patient presents with a chief complaint of congestion.  Differential includes viral infection such as RSV, COVID,  influenza.  Other differentials also include pneumonia  I ordered and interpreted labs including a respiratory panel which was positive for RSV infection  The patient's lungs are clear to auscultation bilaterally and I see no indication at this time for chest imaging  Patient with positive RSV test.  No indication for further emergent workup at this time.  I explained to the patient that this is likely self-limited and recommended measures for supportive care at home.        Final Clinical Impression(s) / ED Diagnoses Final diagnoses:  RSV (respiratory syncytial virus infection)    Rx / DC Orders ED Discharge Orders     None         Pamala Duffel 09/01/23 0557    Nira Conn, MD 09/01/23 [phone]

## 2023-09-11 ENCOUNTER — Other Ambulatory Visit (HOSPITAL_COMMUNITY): Payer: Self-pay

## 2023-09-11 ENCOUNTER — Emergency Department (HOSPITAL_COMMUNITY)

## 2023-09-11 ENCOUNTER — Other Ambulatory Visit: Payer: Self-pay

## 2023-09-11 ENCOUNTER — Emergency Department (HOSPITAL_COMMUNITY)
Admission: EM | Admit: 2023-09-11 | Discharge: 2023-09-11 | Disposition: A | Discharge status: Home / Self Care | Attending: Emergency Medicine | Admitting: Emergency Medicine

## 2023-09-11 ENCOUNTER — Encounter (HOSPITAL_COMMUNITY): Payer: Self-pay

## 2023-09-11 DIAGNOSIS — R1013 Epigastric pain: Secondary | ICD-10-CM | POA: Insufficient documentation

## 2023-09-11 DIAGNOSIS — J181 Lobar pneumonia, unspecified organism: Secondary | ICD-10-CM | POA: Diagnosis not present

## 2023-09-11 DIAGNOSIS — R079 Chest pain, unspecified: Secondary | ICD-10-CM | POA: Diagnosis present

## 2023-09-11 DIAGNOSIS — J189 Pneumonia, unspecified organism: Secondary | ICD-10-CM

## 2023-09-11 LAB — CBC WITH DIFFERENTIAL/PLATELET
Abs Immature Granulocytes: 0.02 10*3/uL (ref 0.00–0.07)
Basophils Absolute: 0 10*3/uL (ref 0.0–0.1)
Basophils Relative: 1 %
Eosinophils Absolute: 0.1 10*3/uL (ref 0.0–0.5)
Eosinophils Relative: 2 %
HCT: 40.2 % (ref 39.0–52.0)
Hemoglobin: 12.6 g/dL — ABNORMAL LOW (ref 13.0–17.0)
Immature Granulocytes: 0 %
Lymphocytes Relative: 31 %
Lymphs Abs: 2.3 10*3/uL (ref 0.7–4.0)
MCH: 25.9 pg — ABNORMAL LOW (ref 26.0–34.0)
MCHC: 31.3 g/dL (ref 30.0–36.0)
MCV: 82.7 fL (ref 80.0–100.0)
Monocytes Absolute: 0.7 10*3/uL (ref 0.1–1.0)
Monocytes Relative: 10 %
Neutro Abs: 4.1 10*3/uL (ref 1.7–7.7)
Neutrophils Relative %: 56 %
Platelets: 466 10*3/uL — ABNORMAL HIGH (ref 150–400)
RBC: 4.86 MIL/uL (ref 4.22–5.81)
RDW: 15 % (ref 11.5–15.5)
WBC: 7.4 10*3/uL (ref 4.0–10.5)
nRBC: 0 % (ref 0.0–0.2)

## 2023-09-11 LAB — TROPONIN I (HIGH SENSITIVITY)
Troponin I (High Sensitivity): 3 ng/L (ref <18)
Troponin I (High Sensitivity): <2 ng/L (ref <18)

## 2023-09-11 LAB — COMPREHENSIVE METABOLIC PANEL WITH GFR
ALT: 20 U/L (ref 0–44)
AST: 26 U/L (ref 15–41)
Albumin: 2.7 g/dL — ABNORMAL LOW (ref 3.5–5.0)
Alkaline Phosphatase: 65 U/L (ref 38–126)
Anion gap: 5 (ref 5–15)
BUN: 15 mg/dL (ref 6–20)
CO2: 27 mmol/L (ref 22–32)
Calcium: 8.2 mg/dL — ABNORMAL LOW (ref 8.9–10.3)
Chloride: 101 mmol/L (ref 98–111)
Creatinine, Ser: 1.01 mg/dL (ref 0.61–1.24)
GFR, Estimated: >60 mL/min (ref >60)
Glucose, Bld: 108 mg/dL — ABNORMAL HIGH (ref 70–99)
Potassium: 3.8 mmol/L (ref 3.5–5.1)
Sodium: 133 mmol/L — ABNORMAL LOW (ref 135–145)
Total Bilirubin: 0.2 mg/dL (ref 0.0–1.2)
Total Protein: 8.1 g/dL (ref 6.5–8.1)

## 2023-09-11 LAB — LIPASE, BLOOD: Lipase: 46 U/L (ref 11–51)

## 2023-09-11 MED ORDER — DOXYCYCLINE HYCLATE 100 MG PO CAPS
100.0000 mg | ORAL_CAPSULE | Freq: Two times a day (BID) | ORAL | 0 refills | Status: DC
  Filled 2023-09-11: qty 20, 10d supply, fill #0

## 2023-09-11 MED ORDER — DOXYCYCLINE HYCLATE 100 MG PO TABS
100.0000 mg | ORAL_TABLET | Freq: Once | ORAL | Status: AC
Start: 2023-09-11 — End: 2023-09-11
  Administered 2023-09-11: 100 mg via ORAL
  Filled 2023-09-11: qty 1

## 2023-09-11 MED ORDER — PANTOPRAZOLE SODIUM 40 MG PO TBEC
40.0000 mg | DELAYED_RELEASE_TABLET | Freq: Every day | ORAL | 3 refills | Status: AC
  Filled 2023-09-11: qty 30, 30d supply, fill #0

## 2023-09-11 MED ORDER — AMOXICILLIN 500 MG PO CAPS
1000.0000 mg | ORAL_CAPSULE | Freq: Two times a day (BID) | ORAL | 0 refills | Status: DC
  Filled 2023-09-11: qty 40, 10d supply, fill #0

## 2023-09-11 NOTE — ED Provider Notes (Signed)
 [place] EMERGENCY DEPARTMENT AT [facility] Provider Note   CSN: [id number] Arrival date & time: 09/11/23  0107     History  Chief Complaint  Patient presents with   Abdominal Pain    Blake Chambers is a 34 y.o. male.  Presents to the emergency department for chest and abdominal pain.  Patient reports nausea, no vomiting.  Symptoms began earlier today.       Home Medications Prior to Admission medications   Medication Sig Start Date End Date Taking? Authorizing Provider  amoxicillin (AMOXIL) 500 MG capsule Take 2 capsules (1,000 mg total) by mouth 2 (two) times daily. 09/11/23  Yes Thula Stewart, Canary Brim, MD  doxycycline (VIBRAMYCIN) 100 MG capsule Take 1 capsule (100 mg total) by mouth 2 (two) times daily. 09/11/23  Yes Lular Letson, Canary Brim, MD  pantoprazole (PROTONIX) 40 MG tablet Take 1 tablet (40 mg total) by mouth daily. 09/11/23  Yes Georga Stys, Canary Brim, MD  acetaminophen (TYLENOL 8 HOUR) 650 MG CR tablet Take 1 tablet (650 mg total) by mouth every 8 (eight) hours as needed for pain. 04/18/22   Mannie Stabile, PA-C  benzonatate (TESSALON) 100 MG capsule Take 1 capsule (100 mg total) by mouth 2 (two) times daily as needed for cough. 08/22/23   Roxy Horseman, PA-C  bictegravir-emtricitabine-tenofovir AF (BIKTARVY) 50-200-25 MG TABS tablet Take 1 tablet by mouth daily. 07/24/23   Laurence Spates, MD  cyclobenzaprine (FLEXERIL) 10 MG tablet Take 1 tablet (10 mg total) by mouth 2 (two) times daily as needed for muscle spasms. 09/26/22   Carroll Sage, PA-C  famotidine (PEPCID) 20 MG tablet Take 1 tablet (20 mg total) by mouth 2 (two) times daily. 05/23/23   Palumbo, April, MD  ipratropium (ATROVENT) 0.03 % nasal spray Place 2 sprays into both nostrils every 12 (twelve) hours. 08/22/23   Roxy Horseman, PA-C  ketoconazole (NIZORAL) 2 % cream Apply once daily to both feet for 2 weeks. 10/21/22   Wallis Bamberg, PA-C  ondansetron (ZOFRAN-ODT) 8 MG disintegrating  tablet Take 1 tablet (8 mg total) by mouth every 8 (eight) hours as needed for nausea 05/23/23   Palumbo, April, MD      Allergies    Blueberry flavoring agent (non-screening), Pineapple, Tenofovir disoproxil, and Kiwi extract    Review of Systems   Review of Systems  Physical Exam Updated Vital Signs BP 120/86   Pulse 88   Temp 97.6 F (36.4 C) (Oral)   Resp 18   Ht 5\' 9"  (1.753 m)   SpO2 100%   BMI 20.97 kg/m  Physical Exam Vitals and nursing note reviewed.  Constitutional:      General: He is not in acute distress.    Appearance: He is well-developed.  HENT:     Head: Normocephalic and atraumatic.     Mouth/Throat:     Mouth: Mucous membranes are moist.  Eyes:     General: Vision grossly intact. Gaze aligned appropriately.     Extraocular Movements: Extraocular movements intact.     Conjunctiva/sclera: Conjunctivae normal.  Cardiovascular:     Rate and Rhythm: Normal rate and regular rhythm.     Pulses: Normal pulses.     Heart sounds: Normal heart sounds, S1 normal and S2 normal. No murmur heard.    No friction rub. No gallop.  Pulmonary:     Effort: Pulmonary effort is normal. No respiratory distress.     Breath sounds: Normal breath sounds.  Abdominal:  Palpations: Abdomen is soft.     Tenderness: There is abdominal tenderness in the epigastric area. There is no guarding or rebound.     Hernia: No hernia is present.  Musculoskeletal:        General: No swelling.     Cervical back: Full passive range of motion without pain, normal range of motion and neck supple. No pain with movement, spinous process tenderness or muscular tenderness. Normal range of motion.     Right lower leg: No edema.     Left lower leg: No edema.  Skin:    General: Skin is warm and dry.     Capillary Refill: Capillary refill takes less than 2 seconds.     Findings: No ecchymosis, erythema, lesion or wound.  Neurological:     Mental Status: He is alert and oriented to person, place,  and time.     GCS: GCS eye subscore is 4. GCS verbal subscore is 5. GCS motor subscore is 6.     Cranial Nerves: Cranial nerves 2-12 are intact.     Sensory: Sensation is intact.     Motor: Motor function is intact. No weakness or abnormal muscle tone.     Coordination: Coordination is intact.  Psychiatric:        Mood and Affect: Mood normal.        Speech: Speech normal.        Behavior: Behavior normal.     ED Results / Procedures / Treatments   Labs (all labs ordered are listed, but only abnormal results are displayed) Labs Reviewed  CBC WITH DIFFERENTIAL/PLATELET - Abnormal; Notable for the following components:      Result Value   Hemoglobin 12.6 (*)    MCH 25.9 (*)    Platelets 466 (*)    All other components within normal limits  COMPREHENSIVE METABOLIC PANEL WITH GFR - Abnormal; Notable for the following components:   Sodium 133 (*)    Glucose, Bld 108 (*)    Calcium 8.2 (*)    Albumin 2.7 (*)    All other components within normal limits  LIPASE, BLOOD  TROPONIN I (HIGH SENSITIVITY)  TROPONIN I (HIGH SENSITIVITY)    EKG EKG Interpretation Date/Time:  Friday September 11 2023 01:30:48 EDT Ventricular Rate:  97 PR Interval:  141 QRS Duration:  90 QT Interval:  384 QTC Calculation: 488 R Axis:   68  Text Interpretation: Sinus rhythm ST depr, consider ischemia, inferior leads Borderline ST elevation, anterior leads Borderline prolonged QT interval No significant change since last tracing Confirmed by Gilda Crease [phone]) on 09/11/2023 1:37:30 AM  Radiology DG Chest Port 1 View Result Date: 09/11/2023 CLINICAL DATA:  Chest pain for 2 days EXAM: PORTABLE CHEST 1 VIEW COMPARISON:  12/22/2022 FINDINGS: Cardiac shadow is within normal limits. Lungs are well aerated bilaterally. Slight increased density is noted in the medial right lung base which may represent some early infiltrate. No bony abnormality is seen. IMPRESSION: Increased density in the right lung base  suggesting early infiltrate. Electronically Signed   By: Alcide Clever M.D.   On: 09/11/2023 01:40    Procedures Procedures    Medications Ordered in ED Medications  doxycycline (VIBRA-TABS) tablet 100 mg (has no administration in time range)    ED Course/ Medical Decision Making/ A&P  Medical Decision Making Amount and/or Complexity of Data Reviewed Labs: ordered. Radiology: ordered.  Risk Prescription drug management.   Presents to the emergency department for evaluation of abdominal and chest pain.  Patient reports that he has had ongoing cough, congestion feel short of breath.  He also has a history of reflux and some of his abdominal discomfort is a burning sensation that he reports feels similar to reflux.  Patient's EKG unchanged.  Troponin negative x 2.  Blood work is entirely normal.  No leukocytosis, normal chemistries.  No elevation of LFTs or lipase, no right upper quadrant tenderness on exam.  Abdominal exam is benign, does not require additional imaging.  Patient noted to have recurrent cough during the exam and his chest x-ray shows evidence of pneumonia.  Reviewing his records reveals that he was on Zithromax earlier in this month for respiratory symptoms.  Will provide more broad-spectrum coverage with amoxicillin and doxycycline.  Follow-up with infectious disease.        Final Clinical Impression(s) / ED Diagnoses Final diagnoses:  Community acquired pneumonia of right lower lobe of lung    Rx / DC Orders ED Discharge Orders          Ordered    pantoprazole (PROTONIX) 40 MG tablet  Daily        09/11/23 0433    doxycycline (VIBRAMYCIN) 100 MG capsule  2 times daily        09/11/23 0435    amoxicillin (AMOXIL) 500 MG capsule  2 times daily        09/11/23 0435              Gilda Crease, MD 09/11/23 [phone]

## 2023-09-11 NOTE — ED Triage Notes (Signed)
 BIB EMS from home for generalized abd pain that is burning x few hours. Hx of acid reflux, states it feels similar. No vomiting, but episodes of belching.

## 2023-09-14 ENCOUNTER — Other Ambulatory Visit (HOSPITAL_COMMUNITY): Payer: Self-pay

## 2023-09-24 ENCOUNTER — Other Ambulatory Visit (HOSPITAL_COMMUNITY): Payer: Self-pay

## 2023-10-06 NOTE — Progress Notes (Signed)
 Reached out to [employer], Bridge Counselor with [facility], for update on referral.   Per Sheletha:  I saw him right before I received a referral on him 09/02/2023. Once on [facility], he was walking across the intersection, and another time at [employer]. I have him on my list of referrals. If you don't mind, let me see what I can do. I will give him another couple of months before I submit a referral to the [facility]. I will go to [employer] today, and check in. I have recently left a message for him at [employer] with the manager of the house.   Nathanuel Cabreja, BSN, RN

## 2023-10-27 NOTE — Progress Notes (Unsigned)
 HPI: Blake Chambers is a 33 y.o. male who presents to the [facility] pharmacy clinic for HIV follow-up.  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   Pain of left forearm    Finger osteomyelitis, left (HCC) 09/11/2020   Family discord 08/13/2020   Perirectal cellulitis 11/01/2017   Abscess 08/20/2017   Perirectal abscess 08/20/2017   Encounter for long-term (current) use of high-risk medication 02/24/2017   Screening examination for venereal disease 10/21/2016   Cocaine use disorder, moderate, dependence (HCC) 10/23/2014   Cannabis use disorder, severe, dependence (HCC) 10/23/2014   Bipolar I disorder, severe, current or most recent episode depressed, with mixed features (HCC) 10/21/2014   Frostbite of both hands 08/01/2014   Tobacco use disorder 07/18/2013   Chronic renal insufficiency, stage II (mild) 11/30/2012   HIV disease (HCC) 02/13/2011   Syphilis 02/13/2011   Bipolar I disorder (HCC) 02/10/2011    Patient's Medications  New Prescriptions   No medications on file  Previous Medications   ACETAMINOPHEN  (TYLENOL  8 HOUR) 650 MG CR TABLET    Take 1 tablet (650 mg total) by mouth every 8 (eight) hours as needed for pain.   AMOXICILLIN  (AMOXIL ) 500 MG CAPSULE    Take 2 capsules (1,000 mg total) by mouth 2 (two) times daily.   BENZONATATE  (TESSALON ) 100 MG CAPSULE    Take 1 capsule (100 mg total) by mouth 2 (two) times daily as needed for cough.   BICTEGRAVIR-EMTRICITABINE -TENOFOVIR  AF (BIKTARVY ) 50-200-25 MG TABS TABLET    Take 1 tablet by mouth daily.   CYCLOBENZAPRINE  (FLEXERIL ) 10 MG TABLET    Take 1 tablet (10 mg total) by mouth 2 (two) times daily as needed for muscle spasms.   DOXYCYCLINE  (VIBRAMYCIN ) 100 MG CAPSULE    Take 1 capsule (100 mg total) by mouth 2 (two) times daily.   FAMOTIDINE  (PEPCID ) 20  MG TABLET    Take 1 tablet (20 mg total) by mouth 2 (two) times daily.   IPRATROPIUM (ATROVENT ) 0.03 % NASAL SPRAY    Place 2 sprays into both nostrils every 12 (twelve) hours.   KETOCONAZOLE  (NIZORAL ) 2 % CREAM    Apply once daily to both feet for 2 weeks.   ONDANSETRON  (ZOFRAN -ODT) 8 MG DISINTEGRATING TABLET    Take 1 tablet (8 mg total) by mouth every 8 (eight) hours as needed for nausea   PANTOPRAZOLE  (PROTONIX ) 40 MG TABLET    Take 1 tablet (40 mg total) by mouth daily.  Modified Medications   No medications on file  Discontinued Medications   No medications on file    Labs: Lab Results  Component Value Date   HIV1RNAQUANT 1,920 (H) 05/19/2022   HIV1RNAQUANT 18,300 (H) 08/14/2021   HIV1RNAQUANT 9,590 (H) 01/10/2021   CD4TABS 520 07/24/2023   CD4TABS 326 (L) 05/19/2022   CD4TABS 462 01/08/2022    RPR and STI Lab Results  Component Value Date   LABRPR Reactive (A) 05/10/2023   LABRPR REACTIVE (A) 05/19/2022   LABRPR Reactive (A) 09/12/2020   LABRPR Reactive (A) 07/10/2019   LABRPR REACTIVE (A) 08/24/2018   RPRTITER 1:4 (H) 05/19/2022   RPRTITER 1:8 (H) 08/24/2018   RPRTITER 1:2 (H) 06/18/2017   RPRTITER 1:1 10/21/2016   RPRTITER 1:2 03/24/2016    STI Results GC GC CT CT  Latest Ref Rng & Units  NEGATIVE  NEGATIVE  05/19/2022  3:04 PM Negative   Negative    01/10/2021  3:15 PM Negative   Negative    11/01/2017 12:00 AM Negative   **POSITIVE**    06/18/2017 12:00 AM Negative   Negative    10/21/2016 12:00 AM Negative   Negative    03/24/2016 12:00 AM Negative   Negative    04/30/2012  3:20 PM  POSITIVE   NEGATIVE     Hepatitis B Lab Results  Component Value Date   HEPBSAB POS (A) 10/10/2013   HEPBSAG NON REACTIVE 09/12/2020   HEPBCAB NON REACTIVE 09/12/2020   Hepatitis C No results found for: "HEPCAB", "HCVRNAPCRQN" Hepatitis A No results found for: "HAV" Lipids: Lab Results  Component Value Date   CHOL 152 01/10/2021   TRIG 138 01/10/2021   HDL 70  01/10/2021   CHOLHDL 2.2 01/10/2021   VLDL 12 03/24/2016   LDLCALC 60 01/10/2021    Current HIV Regimen: ***  Assessment: ***  Plan: ***   Valarie Garner, PharmD PGY1 Pharmacy Resident

## 2023-10-28 ENCOUNTER — Ambulatory Visit: Admitting: Pharmacist

## 2023-10-29 ENCOUNTER — Other Ambulatory Visit: Payer: Self-pay

## 2023-10-29 ENCOUNTER — Encounter (HOSPITAL_COMMUNITY): Payer: Self-pay

## 2023-10-29 ENCOUNTER — Emergency Department (HOSPITAL_COMMUNITY)
Admission: EM | Admit: 2023-10-29 | Discharge: 2023-10-29 | Discharge status: Against Medical Advice | Attending: Emergency Medicine | Admitting: Emergency Medicine

## 2023-10-29 DIAGNOSIS — Z5321 Procedure and treatment not carried out due to patient leaving prior to being seen by health care provider: Secondary | ICD-10-CM | POA: Diagnosis not present

## 2023-10-29 DIAGNOSIS — R109 Unspecified abdominal pain: Secondary | ICD-10-CM | POA: Diagnosis present

## 2023-10-29 LAB — CBC WITH DIFFERENTIAL/PLATELET
Abs Immature Granulocytes: 0.02 10*3/uL (ref 0.00–0.07)
Basophils Absolute: 0 10*3/uL (ref 0.0–0.1)
Basophils Relative: 0 %
Eosinophils Absolute: 0.1 10*3/uL (ref 0.0–0.5)
Eosinophils Relative: 2 %
HCT: 41.4 % (ref 39.0–52.0)
Hemoglobin: 13 g/dL (ref 13.0–17.0)
Immature Granulocytes: 0 %
Lymphocytes Relative: 31 %
Lymphs Abs: 2.7 10*3/uL (ref 0.7–4.0)
MCH: 25.7 pg — ABNORMAL LOW (ref 26.0–34.0)
MCHC: 31.4 g/dL (ref 30.0–36.0)
MCV: 81.8 fL (ref 80.0–100.0)
Monocytes Absolute: 0.9 10*3/uL (ref 0.1–1.0)
Monocytes Relative: 10 %
Neutro Abs: 4.8 10*3/uL (ref 1.7–7.7)
Neutrophils Relative %: 57 %
Platelets: 297 10*3/uL (ref 150–400)
RBC: 5.06 MIL/uL (ref 4.22–5.81)
RDW: 16.7 % — ABNORMAL HIGH (ref 11.5–15.5)
WBC: 8.6 10*3/uL (ref 4.0–10.5)
nRBC: 0 % (ref 0.0–0.2)

## 2023-10-29 LAB — COMPREHENSIVE METABOLIC PANEL WITH GFR
ALT: 33 U/L (ref 0–44)
AST: 29 U/L (ref 15–41)
Albumin: 2.8 g/dL — ABNORMAL LOW (ref 3.5–5.0)
Alkaline Phosphatase: 96 U/L (ref 38–126)
Anion gap: 7 (ref 5–15)
BUN: 7 mg/dL (ref 6–20)
CO2: 26 mmol/L (ref 22–32)
Calcium: 8.6 mg/dL — ABNORMAL LOW (ref 8.9–10.3)
Chloride: 100 mmol/L (ref 98–111)
Creatinine, Ser: 0.89 mg/dL (ref 0.61–1.24)
GFR, Estimated: >60 mL/min (ref >60)
Glucose, Bld: 92 mg/dL (ref 70–99)
Potassium: 3.6 mmol/L (ref 3.5–5.1)
Sodium: 133 mmol/L — ABNORMAL LOW (ref 135–145)
Total Bilirubin: 0.6 mg/dL (ref 0.0–1.2)
Total Protein: 8.1 g/dL (ref 6.5–8.1)

## 2023-10-29 LAB — LIPASE, BLOOD: Lipase: 27 U/L (ref 11–51)

## 2023-10-29 NOTE — ED Provider Triage Note (Signed)
 Emergency Medicine Provider Triage Evaluation Note  Blake Chambers , a 33 y.o. male  was evaluated in triage.  Pt complains of abdominal pain from carrying his backpack no vomiting or diarrhea.  Review of Systems  Positive:  Negative:   Physical Exam  BP 111/74 (BP Location: Right Arm)   Pulse 86   Temp 98.4 F (36.9 C) (Oral)   Resp 19   Ht 5\' 9"  (1.753 m)   Wt 64.4 kg   SpO2 96%   BMI 20.97 kg/m  Gen:   Awake, no distress   Resp:  Normal effort  MSK:   Moves extremities without difficulty  Other:    Medical Decision Making  Medically screening exam initiated at 6:50 PM.  Appropriate orders placed.  Blake Chambers was informed that the remainder of the evaluation will be completed by another provider, this initial triage assessment does not replace that evaluation, and the importance of remaining in the ED until their evaluation is complete.     Tama Fails, PA-C 10/29/23 1921

## 2023-10-29 NOTE — ED Notes (Signed)
 Pt called multiply times. No answer

## 2023-10-29 NOTE — ED Triage Notes (Signed)
 Patient is homeless but complains of abd pain x 4 days.  Patient answer yes to every question you ask about symptoms.

## 2023-10-30 ENCOUNTER — Emergency Department (HOSPITAL_COMMUNITY)
Admission: EM | Admit: 2023-10-30 | Discharge: 2023-10-30 | Disposition: A | Discharge status: Home / Self Care | Attending: Emergency Medicine | Admitting: Emergency Medicine

## 2023-10-30 ENCOUNTER — Encounter (HOSPITAL_COMMUNITY): Payer: Self-pay

## 2023-10-30 DIAGNOSIS — Z79899 Other long term (current) drug therapy: Secondary | ICD-10-CM | POA: Diagnosis not present

## 2023-10-30 DIAGNOSIS — R112 Nausea with vomiting, unspecified: Secondary | ICD-10-CM | POA: Insufficient documentation

## 2023-10-30 DIAGNOSIS — E860 Dehydration: Secondary | ICD-10-CM | POA: Insufficient documentation

## 2023-10-30 DIAGNOSIS — R55 Syncope and collapse: Secondary | ICD-10-CM | POA: Diagnosis present

## 2023-10-30 DIAGNOSIS — Z21 Asymptomatic human immunodeficiency virus [HIV] infection status: Secondary | ICD-10-CM | POA: Insufficient documentation

## 2023-10-30 LAB — COMPREHENSIVE METABOLIC PANEL WITH GFR
ALT: 33 U/L (ref 0–44)
AST: 41 U/L (ref 15–41)
Albumin: 3 g/dL — ABNORMAL LOW (ref 3.5–5.0)
Alkaline Phosphatase: 114 U/L (ref 38–126)
Anion gap: 8 (ref 5–15)
BUN: 12 mg/dL (ref 6–20)
CO2: 23 mmol/L (ref 22–32)
Calcium: 7.9 mg/dL — ABNORMAL LOW (ref 8.9–10.3)
Chloride: 102 mmol/L (ref 98–111)
Creatinine, Ser: 0.99 mg/dL (ref 0.61–1.24)
GFR, Estimated: >60 mL/min (ref >60)
Glucose, Bld: 115 mg/dL — ABNORMAL HIGH (ref 70–99)
Potassium: 4.1 mmol/L (ref 3.5–5.1)
Sodium: 133 mmol/L — ABNORMAL LOW (ref 135–145)
Total Bilirubin: 0.8 mg/dL (ref 0.0–1.2)
Total Protein: 8.1 g/dL (ref 6.5–8.1)

## 2023-10-30 LAB — CBC WITH DIFFERENTIAL/PLATELET
Abs Immature Granulocytes: 0.03 10*3/uL (ref 0.00–0.07)
Basophils Absolute: 0 10*3/uL (ref 0.0–0.1)
Basophils Relative: 0 %
Eosinophils Absolute: 0.1 10*3/uL (ref 0.0–0.5)
Eosinophils Relative: 2 %
HCT: 42.2 % (ref 39.0–52.0)
Hemoglobin: 13.2 g/dL (ref 13.0–17.0)
Immature Granulocytes: 0 %
Lymphocytes Relative: 26 %
Lymphs Abs: 1.9 10*3/uL (ref 0.7–4.0)
MCH: 25.4 pg — ABNORMAL LOW (ref 26.0–34.0)
MCHC: 31.3 g/dL (ref 30.0–36.0)
MCV: 81.3 fL (ref 80.0–100.0)
Monocytes Absolute: 0.7 10*3/uL (ref 0.1–1.0)
Monocytes Relative: 10 %
Neutro Abs: 4.4 10*3/uL (ref 1.7–7.7)
Neutrophils Relative %: 62 %
Platelets: 194 10*3/uL (ref 150–400)
RBC: 5.19 MIL/uL (ref 4.22–5.81)
RDW: 16.7 % — ABNORMAL HIGH (ref 11.5–15.5)
WBC: 7.2 10*3/uL (ref 4.0–10.5)
nRBC: 0 % (ref 0.0–0.2)

## 2023-10-30 LAB — LIPASE, BLOOD: Lipase: 24 U/L (ref 11–51)

## 2023-10-30 MED ORDER — ONDANSETRON HCL 4 MG/2ML IJ SOLN
4.0000 mg | Freq: Once | INTRAMUSCULAR | Status: DC
  Filled 2023-10-30: qty 2

## 2023-10-30 MED ORDER — SODIUM CHLORIDE 0.9 % IV BOLUS
1000.0000 mL | Freq: Once | INTRAVENOUS | Status: AC
  Administered 2023-10-30: 1000 mL via INTRAVENOUS

## 2023-10-30 NOTE — ED Notes (Signed)
 Provided pt with sandwich, cracker and gingerale for PO challenge

## 2023-10-30 NOTE — ED Provider Notes (Signed)
 [place] EMERGENCY DEPARTMENT AT [facility] Provider Note   CSN: [id number] Arrival date & time: 10/30/23  1047     History  Chief Complaint  Patient presents with   Near Syncope    Blake Chambers is a 33 y.o. male.   Near Syncope  Patient with near syncopal episode.  Reportedly was at [employer] walked up and said he felt like his, passed out and threw up.  Mild hypotension with pressure systolic of 102 then.  Reportedly is not been eating much.  Patient states he feels if he is dehydrated.  Feels that his abdomen is painful.  Came to the ER yesterday but was not seen.  Denies fevers.    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 Authorizing Provider  acetaminophen  (TYLENOL  8 HOUR) 650 MG CR tablet Take 1 tablet (650 mg total) by mouth every 8 (eight) hours as needed for pain. 04/18/22   Aberman, Caroline C, PA-C  amoxicillin  (AMOXIL ) 500 MG capsule Take 2 capsules (1,000 mg total) by mouth 2 (two) times daily. 09/11/23   Ballard Bongo, MD  benzonatate  (TESSALON ) 100 MG capsule Take 1 capsule (100 mg total) by mouth 2 (two) times daily as needed for cough. 08/22/23   Sherel Dikes, PA-C  bictegravir-emtricitabine -tenofovir  AF (BIKTARVY ) 50-200-25 MG TABS tablet Take 1 tablet by mouth daily. 07/24/23   Davis, Jonathon H, MD  cyclobenzaprine  (FLEXERIL ) 10 MG tablet Take 1 tablet (10 mg total) by mouth 2 (two) times daily as needed for muscle spasms. 09/26/22   Volney Grumbles, PA-C  doxycycline  (VIBRAMYCIN ) 100 MG capsule Take 1 capsule (100 mg total) by mouth 2 (two) times daily. 09/11/23   Ballard Bongo, MD  famotidine   (PEPCID ) 20 MG tablet Take 1 tablet (20 mg total) by mouth 2 (two) times daily. 05/23/23   Palumbo, April, MD  ipratropium (ATROVENT ) 0.03 % nasal spray Place 2 sprays into both nostrils every 12 (twelve) hours. 08/22/23   Sherel Dikes, PA-C  ketoconazole  (NIZORAL ) 2 % cream Apply once daily to both feet for 2 weeks. 10/21/22   Adolph Hoop, PA-C  ondansetron  (ZOFRAN -ODT) 8 MG disintegrating tablet Take 1 tablet (8 mg total) by mouth every 8 (eight) hours as needed for nausea 05/23/23   Palumbo, April, MD  pantoprazole  (PROTONIX ) 40 MG tablet Take 1 tablet (40 mg total) by mouth daily. 09/11/23   Ballard Bongo, MD      Allergies    Blueberry flavoring agent (non-screening), Pineapple, Tenofovir  disoproxil, and Kiwi extract    Review of Systems   Review of Systems  Cardiovascular:  Positive for near-syncope.    Physical Exam Updated Vital Signs BP 122/80   Pulse 90   Temp 98.1 F (36.7 C) (Oral)   Resp 19   SpO2 99%  Physical Exam Vitals and nursing note reviewed.  HENT:     Head: Normocephalic.  Abdominal:     Tenderness: There is no abdominal tenderness.  Neurological:     Mental Status: He is alert. Mental status is at baseline.     ED Results / Procedures /  Treatments   Labs (all labs ordered are listed, but only abnormal results are displayed) Labs Reviewed  COMPREHENSIVE METABOLIC PANEL WITH GFR - Abnormal; Notable for the following components:      Result Value   Sodium 133 (*)    Glucose, Bld 115 (*)    Calcium 7.9 (*)    Albumin 3.0 (*)    All other components within normal limits  CBC WITH DIFFERENTIAL/PLATELET - Abnormal; Notable for the following components:   MCH 25.4 (*)    RDW 16.7 (*)    All other components within normal limits  LIPASE, BLOOD    EKG EKG Interpretation Date/Time:  Friday Oct 30 2023 11:10:42 EDT Ventricular Rate:  81 PR Interval:  137 QRS Duration:  82 QT Interval:  367 QTC Calculation: 426 R Axis:   59  Text  Interpretation: Sinus arrhythmia ST elev, probable normal early repol pattern No significant change since last tracing Confirmed by Mozell Arias [phone]) on 10/30/2023 11:29:09 AM  Radiology No results found.  Procedures Procedures    Medications Ordered in ED Medications  ondansetron  (ZOFRAN ) injection 4 mg (4 mg Intravenous Not Given 10/30/23 1121)  sodium chloride  0.9 % bolus 1,000 mL (0 mLs Intravenous Stopped 10/30/23 1506)    ED Course/ Medical Decision Making/ A&P                                 Medical Decision Making Amount and/or Complexity of Data Reviewed Labs: ordered.  Risk Prescription drug management.   Patient with reported nausea vomiting and near syncope.  Reported decreased oral intake.  Patient states he is worried he is dehydrated which would be a consideration.  Will give fluid bolus.  Will get basic blood work.  Will give oral trial also.  Blood work reassuring.  Tolerated oral trial.  But still tired.  Allowed to sleep little more but then discharged home.  Likely some dehydration        Final Clinical Impression(s) / ED Diagnoses Final diagnoses:  Dehydration  Near syncope    Rx / DC Orders ED Discharge Orders     None         Mozell Arias, MD 10/30/23 [phone]

## 2023-10-30 NOTE — ED Triage Notes (Signed)
 Pt BIBA from [employer].  Per EMS pt walked up to [employer], stated he felt like he was going to pass out.  EMS states pt vomiting on scene, 4mg  zofran  given.  BP 102/70  other VS within range

## 2023-10-30 NOTE — ED Notes (Signed)
 Pt states no nausea after eating/drinking.  EDP notified

## 2023-11-17 ENCOUNTER — Encounter (HOSPITAL_COMMUNITY): Payer: Self-pay

## 2023-11-17 ENCOUNTER — Other Ambulatory Visit: Payer: Self-pay

## 2023-11-17 ENCOUNTER — Emergency Department (HOSPITAL_COMMUNITY)
Admission: EM | Admit: 2023-11-17 | Discharge: 2023-11-17 | Discharge status: Against Medical Advice | Attending: Emergency Medicine | Admitting: Emergency Medicine

## 2023-11-17 ENCOUNTER — Other Ambulatory Visit (HOSPITAL_COMMUNITY): Payer: Self-pay

## 2023-11-17 DIAGNOSIS — A57 Chancroid: Secondary | ICD-10-CM | POA: Insufficient documentation

## 2023-11-17 DIAGNOSIS — M533 Sacrococcygeal disorders, not elsewhere classified: Secondary | ICD-10-CM | POA: Diagnosis present

## 2023-11-17 DIAGNOSIS — B20 Human immunodeficiency virus [HIV] disease: Secondary | ICD-10-CM | POA: Diagnosis not present

## 2023-11-17 DIAGNOSIS — Z5329 Procedure and treatment not carried out because of patient's decision for other reasons: Secondary | ICD-10-CM | POA: Insufficient documentation

## 2023-11-17 LAB — RPR
RPR Ser Ql: REACTIVE — AB
RPR Titer: 1:32 {titer}

## 2023-11-17 LAB — CBG MONITORING, ED: Glucose-Capillary: 77 mg/dL (ref 70–99)

## 2023-11-17 MED ORDER — CEFTRIAXONE SODIUM 500 MG IJ SOLR
500.0000 mg | Freq: Once | INTRAMUSCULAR | Status: DC
  Filled 2023-11-17: qty 500

## 2023-11-17 MED ORDER — DOXYCYCLINE HYCLATE 100 MG PO CAPS
100.0000 mg | ORAL_CAPSULE | Freq: Two times a day (BID) | ORAL | 0 refills | Status: DC
  Filled 2023-11-17: qty 20, 10d supply, fill #0

## 2023-11-17 MED ORDER — LIDOCAINE HCL (PF) 1 % IJ SOLN
INTRAMUSCULAR | Status: DC
Start: 2023-11-17 — End: 2023-11-17
  Filled 2023-11-17: qty 5

## 2023-11-17 NOTE — ED Provider Notes (Addendum)
 [place] EMERGENCY DEPARTMENT AT [facility] Provider Note   CSN: [id number] Arrival date & time: 11/17/23  0225     History  Chief Complaint  Patient presents with   Tailbone Pain    Blake Chambers is a 33 y.o. male.  Patient is a 33 year old male who presents with painful sores in his genital area.  He said it started about 2 weeks ago.  He denies any penile discharge or burning on urination.  No fevers.  No vomiting or diarrhea.  He is HIV positive but he says he has been off of his medicines for a while.       Home Medications Prior to Admission medications   Medication Sig Start Date End Date Taking? Authorizing Provider  acetaminophen  (TYLENOL  8 HOUR) 650 MG CR tablet Take 1 tablet (650 mg total) by mouth every 8 (eight) hours as needed for pain. 04/18/22   Aberman, Caroline C, PA-C  amoxicillin  (AMOXIL ) 500 MG capsule Take 2 capsules (1,000 mg total) by mouth 2 (two) times daily. 09/11/23   Ballard Bongo, MD  benzonatate  (TESSALON ) 100 MG capsule Take 1 capsule (100 mg total) by mouth 2 (two) times daily as needed for cough. 08/22/23   Sherel Dikes, PA-C  bictegravir-emtricitabine -tenofovir  AF (BIKTARVY ) 50-200-25 MG TABS tablet Take 1 tablet by mouth daily. 07/24/23   Davis, Jonathon H, MD  cyclobenzaprine  (FLEXERIL ) 10 MG tablet Take 1 tablet (10 mg total) by mouth 2 (two) times daily as needed for muscle spasms. 09/26/22   Volney Grumbles, PA-C  doxycycline  (VIBRAMYCIN ) 100 MG capsule Take 1 capsule (100 mg total) by mouth 2 (two) times daily. 09/11/23   Ballard Bongo, MD  famotidine  (PEPCID ) 20 MG tablet Take 1 tablet (20 mg total) by mouth 2 (two) times daily. 05/23/23   Palumbo, April, MD  ipratropium (ATROVENT ) 0.03 % nasal spray Place 2 sprays into both nostrils every 12 (twelve) hours. 08/22/23   Sherel Dikes, PA-C  ketoconazole  (NIZORAL ) 2 % cream Apply once daily to both feet for 2 weeks. 10/21/22   Adolph Hoop, PA-C  ondansetron   (ZOFRAN -ODT) 8 MG disintegrating tablet Take 1 tablet (8 mg total) by mouth every 8 (eight) hours as needed for nausea 05/23/23   Palumbo, April, MD  pantoprazole  (PROTONIX ) 40 MG tablet Take 1 tablet (40 mg total) by mouth daily. 09/11/23   Ballard Bongo, MD      Allergies    Blueberry flavoring agent (non-screening), Pineapple, Tenofovir  disoproxil, and Kiwi extract    Review of Systems   Review of Systems  Constitutional:  Negative for fatigue and fever.  Respiratory:  Negative for shortness of breath.   Cardiovascular:  Negative for chest pain.  Gastrointestinal:  Negative for abdominal pain, nausea and vomiting.  Genitourinary:  Positive for genital sores. Negative for penile discharge, penile pain, scrotal swelling and testicular pain.  Skin:  Positive for rash.    Physical Exam Updated Vital Signs BP 129/84 (BP Location: Left Arm)   Pulse 82   Temp 97.8 F (36.6 C)   Resp 17   Ht 5\' 9"  (1.753 m)   Wt 64.4 kg   SpO2 100%   BMI 20.97 kg/m  Physical Exam Constitutional:      Appearance: He is well-developed.  HENT:     Head: Normocephalic and atraumatic.  Eyes:     Pupils: Pupils are equal, round, and reactive to light.  Cardiovascular:     Rate and Rhythm: Normal rate and regular  rhythm.     Heart sounds: Normal heart sounds.  Pulmonary:     Effort: Pulmonary effort is normal. No respiratory distress.     Breath sounds: Normal breath sounds. No wheezing or rales.  Chest:     Chest wall: No tenderness.  Abdominal:     General: Bowel sounds are normal.     Palpations: Abdomen is soft.     Tenderness: There is no abdominal tenderness. There is no guarding or rebound.  Genitourinary:    Comments: With a chaperone present, external was inspected.  He has 3 or 4 circular painful ulcerated lesions to his scrotal sac.  He does not have any induration or fluctuance.  No drainage.  No discharge from his penis.  No vesicular lesions noted. Musculoskeletal:         General: Normal range of motion.     Cervical back: Normal range of motion and neck supple.  Lymphadenopathy:     Cervical: No cervical adenopathy.  Skin:    General: Skin is warm and dry.     Findings: No rash.  Neurological:     Mental Status: He is alert and oriented to person, place, and time.     ED Results / Procedures / Treatments   Labs (all labs ordered are listed, but only abnormal results are displayed) Labs Reviewed  RPR  HSV 1/2 PCR (SURFACE)  CBG MONITORING, ED  GC/CHLAMYDIA PROBE AMP ([place]) NOT AT [facility]    EKG None  Radiology No results found.  Procedures Procedures    Medications Ordered in ED Medications  cefTRIAXone  (ROCEPHIN ) injection 500 mg (has no administration in time range)    ED Course/ Medical Decision Making/ A&P                                 Medical Decision Making Amount and/or Complexity of Data Reviewed Labs: ordered.  Risk Prescription drug management.   Patient with cancroid lesions.  I do not see any vesicles that would be more concerning for herpetic lesions.  He does not have any concerns for necrotizing fasciitis or scrotal abscess.  He is HIV positive but is not on medications.  He does say that he is getting get back with his infectious disease doctor to get started back on medications.  He is sexually active.  Will treat with a shot of Rocephin .  Will test for syphilis and HSV.  He was discharged in good condition.  Return precautions were given.  Patient refused the Rocephin  shot.  Refused the HSV testing.  Left AMA.  Says he will follow-up with the health department.  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

## 2023-11-17 NOTE — ED Notes (Signed)
 Pt stated "my sugar feels low" during vitals. Pt's blood sugar was taken and was 77. Pt provided with a ginger ale

## 2023-11-17 NOTE — ED Triage Notes (Addendum)
 [place] [facility] [place] [facility] d/t sacral pain - states he has open sores "stinging" - states it looks like "moss"

## 2023-11-17 NOTE — ED Notes (Signed)
 Extra urine sent to main lab

## 2023-11-18 ENCOUNTER — Emergency Department (HOSPITAL_COMMUNITY)
Admission: EM | Admit: 2023-11-18 | Discharge: 2023-11-18 | Discharge status: Against Medical Advice | Attending: Emergency Medicine | Admitting: Emergency Medicine

## 2023-11-18 ENCOUNTER — Other Ambulatory Visit: Payer: Self-pay

## 2023-11-18 DIAGNOSIS — Z5321 Procedure and treatment not carried out due to patient leaving prior to being seen by health care provider: Secondary | ICD-10-CM | POA: Insufficient documentation

## 2023-11-18 DIAGNOSIS — R109 Unspecified abdominal pain: Secondary | ICD-10-CM | POA: Diagnosis present

## 2023-11-18 DIAGNOSIS — Z21 Asymptomatic human immunodeficiency virus [HIV] infection status: Secondary | ICD-10-CM | POA: Insufficient documentation

## 2023-11-18 LAB — COMPREHENSIVE METABOLIC PANEL WITH GFR
ALT: 37 U/L (ref 0–44)
AST: 48 U/L — ABNORMAL HIGH (ref 15–41)
Albumin: 3.3 g/dL — ABNORMAL LOW (ref 3.5–5.0)
Alkaline Phosphatase: 106 U/L (ref 38–126)
Anion gap: 8 (ref 5–15)
BUN: 10 mg/dL (ref 6–20)
CO2: 25 mmol/L (ref 22–32)
Calcium: 8.5 mg/dL — ABNORMAL LOW (ref 8.9–10.3)
Chloride: 103 mmol/L (ref 98–111)
Creatinine, Ser: 0.98 mg/dL (ref 0.61–1.24)
GFR, Estimated: >60 mL/min (ref >60)
Glucose, Bld: 91 mg/dL (ref 70–99)
Potassium: 4.5 mmol/L (ref 3.5–5.1)
Sodium: 136 mmol/L (ref 135–145)
Total Bilirubin: 0.8 mg/dL (ref 0.0–1.2)
Total Protein: 8.2 g/dL — ABNORMAL HIGH (ref 6.5–8.1)

## 2023-11-18 LAB — URINALYSIS, ROUTINE W REFLEX MICROSCOPIC
Bilirubin Urine: NEGATIVE
Glucose, UA: NEGATIVE mg/dL
Hgb urine dipstick: NEGATIVE
Ketones, ur: NEGATIVE mg/dL
Leukocytes,Ua: NEGATIVE
Nitrite: NEGATIVE
Protein, ur: NEGATIVE mg/dL
Specific Gravity, Urine: 1.026 (ref 1.005–1.030)
pH: 7 (ref 5.0–8.0)

## 2023-11-18 LAB — T.PALLIDUM AB, TOTAL: T Pallidum Abs: REACTIVE — AB

## 2023-11-18 LAB — CBC
HCT: 44.2 % (ref 39.0–52.0)
Hemoglobin: 13.7 g/dL (ref 13.0–17.0)
MCH: 25.6 pg — ABNORMAL LOW (ref 26.0–34.0)
MCHC: 31 g/dL (ref 30.0–36.0)
MCV: 82.6 fL (ref 80.0–100.0)
Platelets: 275 10*3/uL (ref 150–400)
RBC: 5.35 MIL/uL (ref 4.22–5.81)
RDW: 17.3 % — ABNORMAL HIGH (ref 11.5–15.5)
WBC: 7.3 10*3/uL (ref 4.0–10.5)
nRBC: 0 % (ref 0.0–0.2)

## 2023-11-18 LAB — LIPASE, BLOOD: Lipase: 41 U/L (ref 11–51)

## 2023-11-18 NOTE — ED Notes (Signed)
 Pt have left the building.

## 2023-11-18 NOTE — ED Triage Notes (Signed)
 BIBA from abdominal pain x 1 week- no nausea, vomiting, or diarrhea. Pt also has a sore to the left side of face. Hx of HIV. 112/72 BP 70 HR 98% room air 141 cbg 99.4 temp

## 2023-11-18 NOTE — ED Notes (Addendum)
 Per [facility] staff, Pt said goodbye and left the building.  Pt no longer visualized in the lobby.

## 2023-11-19 NOTE — Progress Notes (Signed)
 Received the following update from [facility] with [facility]:  I have been out in the field looking for him. He usually goes to [facility]. Kyia Davender, the case manager for [employer], advised me that she was going to re-enroll him for services when he come back around. I advised that he was on my referral list and to just reach out to me when he shows up ASAP. ( Last Week)

## 2023-11-20 LAB — GC/CHLAMYDIA PROBE AMP (~~LOC~~) NOT AT ARMC
Chlamydia: NEGATIVE
Comment: NEGATIVE
Comment: NORMAL
Neisseria Gonorrhea: NEGATIVE

## 2023-11-24 ENCOUNTER — Ambulatory Visit (HOSPITAL_COMMUNITY): Payer: Self-pay

## 2023-11-25 ENCOUNTER — Emergency Department (HOSPITAL_COMMUNITY)
Admission: EM | Admit: 2023-11-25 | Discharge: 2023-11-25 | Disposition: A | Discharge status: Home / Self Care | Attending: Emergency Medicine | Admitting: Emergency Medicine

## 2023-11-25 ENCOUNTER — Other Ambulatory Visit: Payer: Self-pay

## 2023-11-25 DIAGNOSIS — Z21 Asymptomatic human immunodeficiency virus [HIV] infection status: Secondary | ICD-10-CM | POA: Insufficient documentation

## 2023-11-25 DIAGNOSIS — A57 Chancroid: Secondary | ICD-10-CM | POA: Diagnosis not present

## 2023-11-25 DIAGNOSIS — Z5900 Homelessness unspecified: Secondary | ICD-10-CM | POA: Diagnosis not present

## 2023-11-25 DIAGNOSIS — N5082 Scrotal pain: Secondary | ICD-10-CM | POA: Diagnosis present

## 2023-11-25 MED ORDER — AZITHROMYCIN 600 MG PO TABS
1200.0000 mg | ORAL_TABLET | Freq: Once | ORAL | Status: AC
  Administered 2023-11-25: 1200 mg via ORAL
  Filled 2023-11-25: qty 2

## 2023-11-25 NOTE — ED Triage Notes (Signed)
 Pt brought by [place] EMS [place] [employer], Pt complaining of groin pain 10/10 that is been going for a week now. As per pt he was using antibiotic cream. Rash noted noted at the scrotal area.

## 2023-11-25 NOTE — Discharge Instructions (Addendum)
 You were treated today with antibiotics for a genital lesion.  As discussed, it is very important that you follow-up with your infectious disease provider for further management of your HIV.  If you develop any life-threatening symptoms return to the emergency department.

## 2023-11-25 NOTE — ED Provider Notes (Signed)
  EMERGENCY DEPARTMENT AT [facility] Provider Note   CSN: [id number] Arrival date & time: 11/25/23  0200     History  Chief Complaint  Patient presents with   Groin Pain    Blake Chambers is a 33 y.o. male.  Patient with past medical history significant for HIV, syphilis presents the emergency department complaining of painful lesions to his scrotum.  He reports the lesions have been present for approximately 1 week or more.  He has been using over-the-counter antibiotic cream at home.  He denies fever, nausea, vomiting, abdominal pain, urinary symptoms, testicular pain.   Groin Pain       Home Medications Prior to Admission medications   Medication Sig Start Date End Date Taking? Authorizing Provider  acetaminophen  (TYLENOL  8 HOUR) 650 MG CR tablet Take 1 tablet (650 mg total) by mouth every 8 (eight) hours as needed for pain. 04/18/22   Aberman, Caroline C, PA-C  amoxicillin  (AMOXIL ) 500 MG capsule Take 2 capsules (1,000 mg total) by mouth 2 (two) times daily. 09/11/23   Ballard Bongo, MD  benzonatate  (TESSALON ) 100 MG capsule Take 1 capsule (100 mg total) by mouth 2 (two) times daily as needed for cough. 08/22/23   Sherel Dikes, PA-C  bictegravir-emtricitabine -tenofovir  AF (BIKTARVY ) 50-200-25 MG TABS tablet Take 1 tablet by mouth daily. 07/24/23   Davis, Jonathon H, MD  cyclobenzaprine  (FLEXERIL ) 10 MG tablet Take 1 tablet (10 mg total) by mouth 2 (two) times daily as needed for muscle spasms. 09/26/22   Volney Grumbles, PA-C  doxycycline  (VIBRAMYCIN ) 100 MG capsule Take 1 capsule (100 mg total) by mouth 2 (two) times daily. 09/11/23   Ballard Bongo, MD  doxycycline  (VIBRAMYCIN ) 100 MG capsule Take 1 capsule (100 mg total) by mouth 2 (two) times daily. 11/17/23   Hershel Los, MD  famotidine  (PEPCID ) 20 MG tablet Take 1 tablet (20 mg total) by mouth 2 (two) times daily. 05/23/23   Palumbo, April, MD  ipratropium (ATROVENT ) 0.03 % nasal  spray Place 2 sprays into both nostrils every 12 (twelve) hours. 08/22/23   Sherel Dikes, PA-C  ketoconazole  (NIZORAL ) 2 % cream Apply once daily to both feet for 2 weeks. 10/21/22   Adolph Hoop, PA-C  ondansetron  (ZOFRAN -ODT) 8 MG disintegrating tablet Take 1 tablet (8 mg total) by mouth every 8 (eight) hours as needed for nausea 05/23/23   Palumbo, April, MD  pantoprazole  (PROTONIX ) 40 MG tablet Take 1 tablet (40 mg total) by mouth daily. 09/11/23   Ballard Bongo, MD      Allergies    Blueberry flavoring agent (non-screening), Pineapple, Tenofovir  disoproxil, and Kiwi extract    Review of Systems   Review of Systems  Physical Exam Updated Vital Signs BP 122/85 (BP Location: Right Arm)   Pulse 86   Temp (!) 97.4 F (36.3 C) (Oral)   Resp 18   SpO2 100%  Physical Exam Exam conducted with a chaperone present.  Constitutional:      Appearance: He is well-developed.  HENT:     Head: Normocephalic and atraumatic.  Eyes:     Pupils: Pupils are equal, round, and reactive to light.  Cardiovascular:     Rate and Rhythm: Normal rate and regular rhythm.     Heart sounds: Normal heart sounds.  Pulmonary:     Effort: Pulmonary effort is normal. No respiratory distress.     Breath sounds: Normal breath sounds. No wheezing or rales.  Chest:  Chest wall: No tenderness.  Abdominal:     General: Bowel sounds are normal.     Palpations: Abdomen is soft.     Tenderness: There is no abdominal tenderness. There is no guarding or rebound.  Genitourinary:    Comments: 3 or 4 circular painful ulcerated lesions to his scrotal sac.  No induration or fluctuance.  No drainage.  No discharge from his penis.  No vesicular lesions noted. Musculoskeletal:        General: Normal range of motion.     Cervical back: Normal range of motion and neck supple.  Lymphadenopathy:     Cervical: No cervical adenopathy.  Skin:    General: Skin is warm and dry.     Findings: No rash.  Neurological:      Mental Status: He is alert and oriented to person, place, and time.     ED Results / Procedures / Treatments   Labs (all labs ordered are listed, but only abnormal results are displayed) Labs Reviewed - No data to display  EKG None  Radiology No results found.  Procedures Procedures    Medications Ordered in ED Medications  azithromycin  (ZITHROMAX ) tablet 1,200 mg (1,200 mg Oral Given 11/25/23 0239)    ED Course/ Medical Decision Making/ A&P                                 Medical Decision Making Risk Prescription drug management.   This patient presents to the ED for concern of scrotal lesions, this involves an extensive number of treatment options, and is a complaint that carries with it a high risk of complications and morbidity.  The differential diagnosis includes HSV, chancroid, others   Co morbidities / Chronic conditions that complicate the patient evaluation  HIV   Additional history obtained:  Additional history obtained from EMR External records from outside source obtained and reviewed including infectious disease notes showing attempts to help patient make appointments    Problem List / ED Course / Critical interventions / Medication management   I ordered medication including azithromycin  Reevaluation of the patient after these medicines showed that the patient stayed the same I have reviewed the patients home medicines and have made adjustments as needed   Social Determinants of Health:  Patient is homeless   Test / Admission - Considered:  Patient with lesions that appear consistent with chancroid.  Treated with azithromycin  here in the emergency department.  No fluctuance or induration to suggest cellulitis or abscess.  No testicular tenderness to suggest torsion or epididymitis/orchitis.  I have stressed to the patient the importance of following up with infectious disease for further HIV management.  The patient voices understanding  with the importance of this.  I see no indication at this time for further emergent testing.  Will discharge home at this time.  Return precautions provided.         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

## 2023-11-26 ENCOUNTER — Other Ambulatory Visit (HOSPITAL_COMMUNITY): Payer: Self-pay

## 2023-11-26 ENCOUNTER — Ambulatory Visit: Admitting: Family

## 2023-11-27 ENCOUNTER — Other Ambulatory Visit: Payer: Self-pay

## 2023-11-27 ENCOUNTER — Other Ambulatory Visit (HOSPITAL_COMMUNITY): Payer: Self-pay

## 2023-11-27 ENCOUNTER — Emergency Department (HOSPITAL_COMMUNITY)
Admission: EM | Admit: 2023-11-27 | Discharge: 2023-11-27 | Disposition: A | Discharge status: Home / Self Care | Attending: Emergency Medicine | Admitting: Emergency Medicine

## 2023-11-27 ENCOUNTER — Encounter (HOSPITAL_COMMUNITY): Payer: Self-pay

## 2023-11-27 ENCOUNTER — Ambulatory Visit (INDEPENDENT_AMBULATORY_CARE_PROVIDER_SITE_OTHER): Admitting: Family

## 2023-11-27 ENCOUNTER — Encounter: Payer: Self-pay | Admitting: Family

## 2023-11-27 VITALS — BP 114/77 | HR 83 | Temp 96.3°F | Ht 69.0 in | Wt 155.0 lb

## 2023-11-27 DIAGNOSIS — R7309 Other abnormal glucose: Secondary | ICD-10-CM | POA: Diagnosis not present

## 2023-11-27 DIAGNOSIS — A539 Syphilis, unspecified: Secondary | ICD-10-CM | POA: Diagnosis not present

## 2023-11-27 DIAGNOSIS — R3989 Other symptoms and signs involving the genitourinary system: Secondary | ICD-10-CM | POA: Insufficient documentation

## 2023-11-27 DIAGNOSIS — F314 Bipolar disorder, current episode depressed, severe, without psychotic features: Secondary | ICD-10-CM

## 2023-11-27 DIAGNOSIS — N485 Ulcer of penis: Secondary | ICD-10-CM | POA: Insufficient documentation

## 2023-11-27 DIAGNOSIS — Z21 Asymptomatic human immunodeficiency virus [HIV] infection status: Secondary | ICD-10-CM | POA: Insufficient documentation

## 2023-11-27 DIAGNOSIS — Z0000 Encounter for general adult medical examination without abnormal findings: Secondary | ICD-10-CM | POA: Insufficient documentation

## 2023-11-27 DIAGNOSIS — A630 Anogenital (venereal) warts: Secondary | ICD-10-CM | POA: Diagnosis not present

## 2023-11-27 DIAGNOSIS — B20 Human immunodeficiency virus [HIV] disease: Secondary | ICD-10-CM | POA: Diagnosis not present

## 2023-11-27 DIAGNOSIS — Z609 Problem related to social environment, unspecified: Secondary | ICD-10-CM | POA: Insufficient documentation

## 2023-11-27 LAB — CBG MONITORING, ED: Glucose-Capillary: 101 mg/dL — ABNORMAL HIGH (ref 70–99)

## 2023-11-27 MED ORDER — LIDOCAINE HCL URETHRAL/MUCOSAL 2 % EX GEL
1.0000 | Freq: Once | CUTANEOUS | Status: AC
  Administered 2023-11-27: 1 via TOPICAL
  Filled 2023-11-27: qty 11

## 2023-11-27 MED ORDER — PENICILLIN G BENZATHINE 1200000 UNIT/2ML IM SUSY
2.4000 10*6.[IU] | PREFILLED_SYRINGE | Freq: Once | INTRAMUSCULAR | Status: AC
  Administered 2023-11-27: 2.4 10*6.[IU] via INTRAMUSCULAR

## 2023-11-27 MED ORDER — VALACYCLOVIR HCL 1 G PO TABS
1000.0000 mg | ORAL_TABLET | Freq: Two times a day (BID) | ORAL | 0 refills | Status: AC
  Filled 2023-11-27: qty 20, 10d supply, fill #0

## 2023-11-27 MED ORDER — BICTEGRAVIR-EMTRICITAB-TENOFOV 50-200-25 MG PO TABS: 1.0000 | ORAL_TABLET | Freq: Every day | ORAL | 2 refills | Status: DC

## 2023-11-27 MED ORDER — CLOTRIMAZOLE 1 % EX CREA
1.0000 | TOPICAL_CREAM | Freq: Two times a day (BID) | CUTANEOUS | 0 refills | Status: AC
  Filled 2023-11-27: qty 30, 15d supply, fill #0

## 2023-11-27 NOTE — Assessment & Plan Note (Signed)
 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  Will treat with valacyclovir and topical antifungal cream.

## 2023-11-27 NOTE — Assessment & Plan Note (Signed)
 Previously on fluoxetine  and aripiprazole  and not currently taking medication. May need to consider restarting medication and getting connected with counseling as he does have an elevated PHQ9 score. Denies suicidal ideations when asked and no signs of psychosis.
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
## 2023-11-27 NOTE — Assessment & Plan Note (Signed)
 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  No indications for emergent treatment pending reestablishing care.  Will consider additional treatment once other symptoms improved.

## 2023-11-27 NOTE — ED Triage Notes (Signed)
 Arrives [facility] from [facility] with ongoing testicle pain.   Recently dx with STD and provided with antibiotics.

## 2023-11-27 NOTE — Patient Instructions (Signed)
 Nice to see you.  We will check your lab work today.  Start taking your medication daily as prescribed.  Refills have been sent to the pharmacy.  Plan for follow up in 1 week  or sooner if needed with lab work on the same day.  Have a great day and stay safe!

## 2023-11-27 NOTE — Discharge Instructions (Addendum)
 You may use the topical numbing medicine if you need to.  You really need to follow-up with infectious disease for care of your HIV.  Get rechecked if you have new or concerning symptoms.

## 2023-11-27 NOTE — Assessment & Plan Note (Signed)
 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  Treated with 2,400,000 units of Bicillin  once as he did not take the doxycycline  that was previously prescribed.  Plan to recheck RPR in 3 to 6 months.

## 2023-11-27 NOTE — Assessment & Plan Note (Signed)
 [name] has poorly controlled virus secondary to being off medication and out of care since at least 11/01/2021.  Reviewed previous lab work and discussed plan of care to reengage in importance of taking medications on a daily basis to reduce risk of disease progression and complications in the future.  Fortunately his CD4 count seems to be preserved at 520 in February.  Barriers to remaining care include mental health, homelessness, and food insecurity.  Discussed plan of care with case management and will connect to resources.  Check lab work.  Restart Biktarvy  with sample provided.  Discussed possibly starting Cabenuva if he is able to attend appointments.  Plan for follow-up in 1 week or sooner if needed.Aaron Aas

## 2023-11-27 NOTE — Assessment & Plan Note (Signed)
 Condoms offered - recent STD testing performed and treated.  Vaccinations deferred.  Will be due for routine dental care.

## 2023-11-27 NOTE — Assessment & Plan Note (Signed)
 Blake Chambers is experiencing homelessness and food insecurity.  Previously approved for disability with checks currently on hold due to lack of payer.  Resources reviewed with case management and will attempt to connect with bus passes for transportation and refer to [facility] for housing assistance.
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
## 2023-11-30 ENCOUNTER — Other Ambulatory Visit: Payer: Self-pay | Admitting: Pharmacist

## 2023-11-30 ENCOUNTER — Ambulatory Visit: Payer: Self-pay | Admitting: Family

## 2023-11-30 DIAGNOSIS — B20 Human immunodeficiency virus [HIV] disease: Secondary | ICD-10-CM

## 2023-11-30 LAB — COMPREHENSIVE METABOLIC PANEL WITH GFR
AG Ratio: 0.7 (calc) — ABNORMAL LOW (ref 1.0–2.5)
ALT: 34 U/L (ref 9–46)
AST: 34 U/L (ref 10–40)
Albumin: 3.3 g/dL — ABNORMAL LOW (ref 3.6–5.1)
Alkaline phosphatase (APISO): 110 U/L (ref 36–130)
BUN: 10 mg/dL (ref 7–25)
CO2: 25 mmol/L (ref 20–32)
Calcium: 8.7 mg/dL (ref 8.6–10.3)
Chloride: 104 mmol/L (ref 98–110)
Creat: 0.86 mg/dL (ref 0.60–1.26)
Globulin: 4.5 g/dL — ABNORMAL HIGH (ref 1.9–3.7)
Glucose, Bld: 98 mg/dL (ref 65–99)
Potassium: 4.2 mmol/L (ref 3.5–5.3)
Sodium: 136 mmol/L (ref 135–146)
Total Bilirubin: 0.3 mg/dL (ref 0.2–1.2)
Total Protein: 7.8 g/dL (ref 6.1–8.1)
eGFR: 117 mL/min/{1.73_m2} (ref >=60)

## 2023-11-30 LAB — T-HELPER CELLS (CD4) COUNT (NOT AT ARMC)
Absolute CD4: 525 {cells}/uL (ref 490–1740)
CD4 T Helper %: 26 % — ABNORMAL LOW (ref 30–61)
Total lymphocyte count: 2013 {cells}/uL (ref 850–3900)

## 2023-11-30 LAB — MONKEYPOX VIRUS DNA, QUALITATIVE REAL-TIME PCR: Orthopoxvirus DNA, QL PCR: NOT DETECTED

## 2023-11-30 LAB — HIV-1 RNA QUANT-NO REFLEX-BLD
HIV 1 RNA Quant: 24400 {copies}/mL — ABNORMAL HIGH
HIV-1 RNA Quant, Log: 4.39 {Log_copies}/mL — ABNORMAL HIGH

## 2023-11-30 MED ORDER — BIKTARVY 50-200-25 MG PO TABS: 1.0000 | ORAL_TABLET | Freq: Every day | ORAL | Status: AC

## 2023-11-30 NOTE — Progress Notes (Signed)
 Medication Samples have been provided to the patient.  Drug name: Biktarvy         Strength: 50/200/25 mg       Qty: 2 bottles (14 tablets)   LOT: [id number]   Exp.Date: 01/13/26   Samples requested by Gwenevere Lent.  Dosing instructions: Take one tablet by mouth once daily  The patient has been instructed regarding the correct time, dose, and frequency of taking this medication, including desired effects and most common side effects.   Jynesis Nakamura L. Margart Shears, PharmD, BCIDP, AAHIVP, CPP Clinical Pharmacist Practitioner Infectious Diseases Clinical Pharmacist [facility] 05/28/2020, 10:07 AM

## 2023-12-01 LAB — HSV CULTURE AND TYPING

## 2023-12-04 ENCOUNTER — Ambulatory Visit: Admitting: Family

## 2023-12-08 ENCOUNTER — Other Ambulatory Visit: Payer: Self-pay

## 2023-12-08 ENCOUNTER — Emergency Department (HOSPITAL_COMMUNITY)

## 2023-12-08 ENCOUNTER — Encounter (HOSPITAL_COMMUNITY): Payer: Self-pay | Admitting: Emergency Medicine

## 2023-12-08 ENCOUNTER — Emergency Department (HOSPITAL_COMMUNITY)
Admission: EM | Admit: 2023-12-08 | Discharge: 2023-12-09 | Disposition: A | Discharge status: Home / Self Care | Attending: Emergency Medicine | Admitting: Emergency Medicine

## 2023-12-08 DIAGNOSIS — S0990XA Unspecified injury of head, initial encounter: Secondary | ICD-10-CM | POA: Diagnosis present

## 2023-12-08 DIAGNOSIS — S0101XA Laceration without foreign body of scalp, initial encounter: Secondary | ICD-10-CM | POA: Insufficient documentation

## 2023-12-08 DIAGNOSIS — R109 Unspecified abdominal pain: Secondary | ICD-10-CM

## 2023-12-08 DIAGNOSIS — W228XXA Striking against or struck by other objects, initial encounter: Secondary | ICD-10-CM | POA: Diagnosis not present

## 2023-12-08 DIAGNOSIS — R1031 Right lower quadrant pain: Secondary | ICD-10-CM | POA: Diagnosis not present

## 2023-12-08 DIAGNOSIS — S0003XA Contusion of scalp, initial encounter: Secondary | ICD-10-CM

## 2023-12-08 DIAGNOSIS — R739 Hyperglycemia, unspecified: Secondary | ICD-10-CM | POA: Insufficient documentation

## 2023-12-08 LAB — COMPREHENSIVE METABOLIC PANEL WITH GFR
ALT: 36 U/L (ref 0–44)
AST: 41 U/L (ref 15–41)
Albumin: 2.9 g/dL — ABNORMAL LOW (ref 3.5–5.0)
Alkaline Phosphatase: 66 U/L (ref 38–126)
Anion gap: 10 (ref 5–15)
BUN: 15 mg/dL (ref 6–20)
CO2: 22 mmol/L (ref 22–32)
Calcium: 8.4 mg/dL — ABNORMAL LOW (ref 8.9–10.3)
Chloride: 106 mmol/L (ref 98–111)
Creatinine, Ser: 1.17 mg/dL (ref 0.61–1.24)
GFR, Estimated: >60 mL/min (ref >60)
Glucose, Bld: 119 mg/dL — ABNORMAL HIGH (ref 70–99)
Potassium: 3.6 mmol/L (ref 3.5–5.1)
Sodium: 138 mmol/L (ref 135–145)
Total Bilirubin: 0.4 mg/dL (ref 0.0–1.2)
Total Protein: 8 g/dL (ref 6.5–8.1)

## 2023-12-08 LAB — CBC
HCT: 41.4 % (ref 39.0–52.0)
Hemoglobin: 13.1 g/dL (ref 13.0–17.0)
MCH: 25.7 pg — ABNORMAL LOW (ref 26.0–34.0)
MCHC: 31.6 g/dL (ref 30.0–36.0)
MCV: 81.3 fL (ref 80.0–100.0)
Platelets: 326 10*3/uL (ref 150–400)
RBC: 5.09 MIL/uL (ref 4.22–5.81)
RDW: 16.9 % — ABNORMAL HIGH (ref 11.5–15.5)
WBC: 5.8 10*3/uL (ref 4.0–10.5)
nRBC: 0 % (ref 0.0–0.2)

## 2023-12-08 LAB — ETHANOL: Alcohol, Ethyl (B): <15 mg/dL (ref <15)

## 2023-12-08 LAB — LIPASE, BLOOD: Lipase: 38 U/L (ref 11–51)

## 2023-12-08 MED ORDER — IOHEXOL 350 MG/ML SOLN
75.0000 mL | Freq: Once | INTRAVENOUS | Status: AC | PRN
  Administered 2023-12-08: 75 mL via INTRAVENOUS

## 2023-12-08 MED ORDER — ONDANSETRON 4 MG PO TBDP
4.0000 mg | ORAL_TABLET | Freq: Once | ORAL | Status: AC | PRN
  Administered 2023-12-08: 4 mg via ORAL
  Filled 2023-12-08: qty 1
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
## 2023-12-08 NOTE — ED Notes (Addendum)
 Pt had MSE by Lauraine, PA and cleared to go to lobby.

## 2023-12-08 NOTE — ED Provider Notes (Incomplete)
 [place] EMERGENCY DEPARTMENT AT [facility] Provider Note   CSN: [id number] Arrival date & time: 12/08/23  2007     Patient presents with: Abdominal Pain   Jagdeep Ancheta is a 33 y.o. male.  {Add pertinent medical, surgical, social history, OB history to YEP:67052} The history is provided by the patient.  Abdominal Pain He has a history of bipolar disorder, syphilis, HIV disease   Prior to Admission medications   Medication Sig Start Date End Date Taking? Authorizing Provider  acetaminophen  (TYLENOL  8 HOUR) 650 MG CR tablet Take 1 tablet (650 mg total) by mouth every 8 (eight) hours as needed for pain. 04/18/22   Aberman, Caroline C, PA-C  bictegravir-emtricitabine -tenofovir  AF (BIKTARVY ) 50-200-25 MG TABS tablet Take 1 tablet by mouth daily. 11/27/23   Calone, Gregory D, FNP  bictegravir-emtricitabine -tenofovir  AF (BIKTARVY ) 50-200-25 MG TABS tablet Take 1 tablet by mouth daily for 14 days. 11/27/23 12/11/23  Kuppelweiser, Cassie L, RPH-CPP  clotrimazole  (CLOTRIMAZOLE  AF) 1 % cream Apply 1 Application topically 2 (two) times daily. 11/27/23   Calone, Gregory D, FNP  famotidine  (PEPCID ) 20 MG tablet Take 1 tablet (20 mg total) by mouth 2 (two) times daily. 05/23/23   Palumbo, April, MD  ipratropium (ATROVENT ) 0.03 % nasal spray Place 2 sprays into both nostrils every 12 (twelve) hours. 08/22/23   Vicky Charleston, PA-C  ketoconazole  (NIZORAL ) 2 % cream Apply once daily to both feet for 2 weeks. 10/21/22   Christopher Savannah, PA-C  ondansetron  (ZOFRAN -ODT) 8 MG disintegrating tablet Take 1 tablet (8 mg total) by mouth every 8 (eight) hours as needed for nausea 05/23/23   Palumbo, April, MD  pantoprazole  (PROTONIX ) 40 MG tablet Take 1 tablet (40 mg total) by mouth daily. 09/11/23   Haze Lonni PARAS, MD  valACYclovir  (VALTREX ) 1000 MG tablet Take 1 tablet (1,000 mg total) by mouth 2 (two) times daily. 11/27/23   Calone, Gregory D, FNP    Allergies: Blueberry flavoring agent  (non-screening), Pineapple, Tenofovir  disoproxil, and Kiwi extract    Review of Systems  Gastrointestinal:  Positive for abdominal pain.  All other systems reviewed and are negative.   Updated Vital Signs BP 120/86   Pulse 69   Temp 98.1 F (36.7 C)   Resp 16   Ht 5' 9 (1.753 m)   Wt 67.6 kg   SpO2 99%   BMI 22.00 kg/m   Physical Exam Vitals and nursing note reviewed.   33 year old male, resting comfortably and in no acute distress. Vital signs are ***. Oxygen saturation is ***%, which is normal. Head is normocephalic and atraumatic. PERRLA, EOMI. Oropharynx is clear. Neck is nontender and supple without adenopathy or JVD. Back is nontender and there is no CVA tenderness. Lungs are clear without rales, wheezes, or rhonchi. Chest is nontender. Heart has regular rate and rhythm without murmur. Abdomen is soft, flat, nontender without masses or hepatosplenomegaly and peristalsis is normoactive. Extremities have no cyanosis or edema, full range of motion is present. Skin is warm and dry without rash. Neurologic: Mental status is normal, cranial nerves are intact, there are no motor or sensory deficits.  (all labs ordered are listed, but only abnormal results are displayed) Labs Reviewed  COMPREHENSIVE METABOLIC PANEL WITH GFR - Abnormal; Notable for the following components:      Result Value   Glucose, Bld 119 (*)    Calcium 8.4 (*)    Albumin 2.9 (*)    All other components within normal limits  CBC -  Abnormal; Notable for the following components:   MCH 25.7 (*)    RDW 16.9 (*)    All other components within normal limits  LIPASE, BLOOD  ETHANOL  URINALYSIS, ROUTINE W REFLEX MICROSCOPIC  RAPID URINE DRUG SCREEN, HOSP PERFORMED    EKG: None  Radiology: CT Head Wo Contrast Result Date: 12/08/2023 CLINICAL DATA:  Head trauma, moderate-severe EXAM: CT HEAD WITHOUT CONTRAST TECHNIQUE: Contiguous axial images were obtained from the base of the skull through the  vertex without intravenous contrast. RADIATION DOSE REDUCTION: This exam was performed according to the departmental dose-optimization program which includes automated exposure control, adjustment of the mA and/or kV according to patient size and/or use of iterative reconstruction technique. COMPARISON:  07/24/2023 FINDINGS: Brain: No acute intracranial abnormality. Specifically, no hemorrhage, hydrocephalus, mass lesion, acute infarction, or significant intracranial injury. Vascular: No hyperdense vessel or unexpected calcification. Skull: No acute calvarial abnormality. Sinuses/Orbits: Mucosal thickening in the left frontal sinus and ethmoid air cells as well as left maxillary sinus. No air-fluid levels. Mastoid air cells clear. Other: None IMPRESSION: No acute intracranial abnormality. Chronic left sinusitis. Electronically Signed   By: Franky Crease M.D.   On: 12/08/2023 22:35   CT ABDOMEN PELVIS W CONTRAST Result Date: 12/08/2023 CLINICAL DATA:  Abdominal pain, acute, nonlocalized EXAM: CT ABDOMEN AND PELVIS WITH CONTRAST TECHNIQUE: Multidetector CT imaging of the abdomen and pelvis was performed using the standard protocol following bolus administration of intravenous contrast. RADIATION DOSE REDUCTION: This exam was performed according to the departmental dose-optimization program which includes automated exposure control, adjustment of the mA and/or kV according to patient size and/or use of iterative reconstruction technique. CONTRAST:  75mL OMNIPAQUE  IOHEXOL  350 MG/ML SOLN COMPARISON:  05/22/2023 FINDINGS: Lower chest: No acute abnormality Hepatobiliary: No focal hepatic abnormality. Gallbladder unremarkable. Pancreas: No focal abnormality or ductal dilatation. Spleen: No focal abnormality.  Normal size. Adrenals/Urinary Tract: No adrenal abnormality. No focal renal abnormality. No stones or hydronephrosis. Urinary bladder is unremarkable. Stomach/Bowel: Small bowel intussusception noted in the left  abdomen seen on coronal image 43 and axial image 42. No associated bowel obstruction. No inflammatory process. Vascular/Lymphatic: No evidence of aneurysm or adenopathy. Reproductive: No visible focal abnormality. Other: No free fluid or free air. Musculoskeletal: No acute bony abnormality. IMPRESSION: Short segment small bowel intussusception in the left abdomen without evidence of obstruction. This may be a transient, incidental finding. This could be followed with short-term repeat CT to assess for resolution. Otherwise no acute findings in the abdomen or pelvis. Electronically Signed   By: Franky Crease M.D.   On: 12/08/2023 22:35    {Document cardiac monitor, telemetry assessment procedure when appropriate:32947} Procedures   Medications Ordered in the ED  ondansetron  (ZOFRAN -ODT) disintegrating tablet 4 mg (4 mg Oral Given 12/08/23 2019)  iohexol  (OMNIPAQUE ) 350 MG/ML injection 75 mL (75 mLs Intravenous Contrast Given 12/08/23 2221)      {Click here for ABCD2, HEART and other calculators REFRESH Note before signing:1}                              Medical Decision Making Amount and/or Complexity of Data Reviewed Labs: ordered.  Risk Prescription drug management.   ***  {Document critical care time when appropriate  Document review of labs and clinical decision tools ie CHADS2VASC2, etc  Document your independent review of radiology images and any outside records  Document your discussion with family members, caretakers and with consultants  Document  social determinants of health affecting pt's care  Document your decision making why or why not admission, treatments were needed:32947:::1}   Final diagnoses:  None    ED Discharge Orders     None

## 2023-12-09 ENCOUNTER — Emergency Department (HOSPITAL_COMMUNITY)

## 2023-12-09 NOTE — Discharge Instructions (Addendum)
 Your evaluation did not show any sign of a serious problem.  However, return to the emergency department if symptoms are getting worse.

## 2024-01-09 ENCOUNTER — Inpatient Hospital Stay (HOSPITAL_COMMUNITY)
Admission: EM | Admit: 2024-01-09 | Discharge: 2024-01-11 | DRG: 872 | Discharge status: Against Medical Advice | Attending: Internal Medicine | Admitting: Internal Medicine

## 2024-01-09 ENCOUNTER — Emergency Department (HOSPITAL_COMMUNITY)

## 2024-01-09 ENCOUNTER — Other Ambulatory Visit: Payer: Self-pay

## 2024-01-09 DIAGNOSIS — Z5329 Procedure and treatment not carried out because of patient's decision for other reasons: Secondary | ICD-10-CM | POA: Diagnosis present

## 2024-01-09 DIAGNOSIS — F17210 Nicotine dependence, cigarettes, uncomplicated: Secondary | ICD-10-CM | POA: Diagnosis present

## 2024-01-09 DIAGNOSIS — Z1152 Encounter for screening for COVID-19: Secondary | ICD-10-CM

## 2024-01-09 DIAGNOSIS — E876 Hypokalemia: Secondary | ICD-10-CM | POA: Diagnosis present

## 2024-01-09 DIAGNOSIS — E872 Acidosis, unspecified: Secondary | ICD-10-CM | POA: Diagnosis present

## 2024-01-09 DIAGNOSIS — J40 Bronchitis, not specified as acute or chronic: Secondary | ICD-10-CM | POA: Diagnosis not present

## 2024-01-09 DIAGNOSIS — X58XXXA Exposure to other specified factors, initial encounter: Secondary | ICD-10-CM | POA: Diagnosis present

## 2024-01-09 DIAGNOSIS — Z79899 Other long term (current) drug therapy: Secondary | ICD-10-CM

## 2024-01-09 DIAGNOSIS — R911 Solitary pulmonary nodule: Secondary | ICD-10-CM | POA: Diagnosis present

## 2024-01-09 DIAGNOSIS — Z888 Allergy status to other drugs, medicaments and biological substances status: Secondary | ICD-10-CM

## 2024-01-09 DIAGNOSIS — R6510 Systemic inflammatory response syndrome (SIRS) of non-infectious origin without acute organ dysfunction: Secondary | ICD-10-CM | POA: Diagnosis present

## 2024-01-09 DIAGNOSIS — R7881 Bacteremia: Secondary | ICD-10-CM | POA: Diagnosis present

## 2024-01-09 DIAGNOSIS — R55 Syncope and collapse: Secondary | ICD-10-CM | POA: Diagnosis present

## 2024-01-09 DIAGNOSIS — A4189 Other specified sepsis: Secondary | ICD-10-CM | POA: Diagnosis not present

## 2024-01-09 DIAGNOSIS — F319 Bipolar disorder, unspecified: Secondary | ICD-10-CM | POA: Diagnosis present

## 2024-01-09 DIAGNOSIS — Z91148 Patient's other noncompliance with medication regimen for other reason: Secondary | ICD-10-CM

## 2024-01-09 DIAGNOSIS — S2231XA Fracture of one rib, right side, initial encounter for closed fracture: Secondary | ICD-10-CM | POA: Diagnosis present

## 2024-01-09 DIAGNOSIS — A419 Sepsis, unspecified organism: Principal | ICD-10-CM

## 2024-01-09 DIAGNOSIS — Z91018 Allergy to other foods: Secondary | ICD-10-CM

## 2024-01-09 DIAGNOSIS — Z21 Asymptomatic human immunodeficiency virus [HIV] infection status: Secondary | ICD-10-CM | POA: Diagnosis present

## 2024-01-09 DIAGNOSIS — Z5901 Sheltered homelessness: Secondary | ICD-10-CM

## 2024-01-09 LAB — CBC WITH DIFFERENTIAL/PLATELET
Abs Immature Granulocytes: 0.09 K/uL — ABNORMAL HIGH (ref 0.00–0.07)
Basophils Absolute: 0 K/uL (ref 0.0–0.1)
Basophils Relative: 0 %
Eosinophils Absolute: 0.1 K/uL (ref 0.0–0.5)
Eosinophils Relative: 1 %
HCT: 40.2 % (ref 39.0–52.0)
Hemoglobin: 12.9 g/dL — ABNORMAL LOW (ref 13.0–17.0)
Immature Granulocytes: 1 %
Lymphocytes Relative: 16 %
Lymphs Abs: 1.5 K/uL (ref 0.7–4.0)
MCH: 25.6 pg — ABNORMAL LOW (ref 26.0–34.0)
MCHC: 32.1 g/dL (ref 30.0–36.0)
MCV: 79.8 fL — ABNORMAL LOW (ref 80.0–100.0)
Monocytes Absolute: 0.8 K/uL (ref 0.1–1.0)
Monocytes Relative: 8 %
Neutro Abs: 6.8 K/uL (ref 1.7–7.7)
Neutrophils Relative %: 74 %
Platelets: 266 K/uL (ref 150–400)
RBC: 5.04 MIL/uL (ref 4.22–5.81)
RDW: 15.9 % — ABNORMAL HIGH (ref 11.5–15.5)
WBC: 9.3 K/uL (ref 4.0–10.5)
nRBC: 0 % (ref 0.0–0.2)

## 2024-01-09 LAB — I-STAT CHEM 8, ED
BUN: 6 mg/dL (ref 6–20)
Calcium, Ion: 0.99 mmol/L — ABNORMAL LOW (ref 1.15–1.40)
Chloride: 102 mmol/L (ref 98–111)
Creatinine, Ser: 1 mg/dL (ref 0.61–1.24)
Glucose, Bld: 81 mg/dL (ref 70–99)
HCT: 42 % (ref 39.0–52.0)
Hemoglobin: 14.3 g/dL (ref 13.0–17.0)
Potassium: 3.2 mmol/L — ABNORMAL LOW (ref 3.5–5.1)
Sodium: 136 mmol/L (ref 135–145)
TCO2: 23 mmol/L (ref 22–32)

## 2024-01-09 LAB — CK: Total CK: 188 U/L (ref 49–397)

## 2024-01-09 LAB — COMPREHENSIVE METABOLIC PANEL WITH GFR
ALT: 29 U/L (ref 0–44)
AST: 29 U/L (ref 15–41)
Albumin: 2.8 g/dL — ABNORMAL LOW (ref 3.5–5.0)
Alkaline Phosphatase: 66 U/L (ref 38–126)
Anion gap: 11 (ref 5–15)
BUN: 7 mg/dL (ref 6–20)
CO2: 24 mmol/L (ref 22–32)
Calcium: 8.3 mg/dL — ABNORMAL LOW (ref 8.9–10.3)
Chloride: 99 mmol/L (ref 98–111)
Creatinine, Ser: 0.99 mg/dL (ref 0.61–1.24)
GFR, Estimated: >60 mL/min (ref >60)
Glucose, Bld: 107 mg/dL — ABNORMAL HIGH (ref 70–99)
Potassium: 3.3 mmol/L — ABNORMAL LOW (ref 3.5–5.1)
Sodium: 134 mmol/L — ABNORMAL LOW (ref 135–145)
Total Bilirubin: 0.8 mg/dL (ref 0.0–1.2)
Total Protein: 8.1 g/dL (ref 6.5–8.1)

## 2024-01-09 LAB — LIPASE, BLOOD: Lipase: 26 U/L (ref 11–51)

## 2024-01-09 LAB — I-STAT CG4 LACTIC ACID, ED: Lactic Acid, Venous: 2.7 mmol/L (ref 0.5–1.9)

## 2024-01-09 LAB — MAGNESIUM: Magnesium: 1.7 mg/dL (ref 1.7–2.4)

## 2024-01-09 LAB — ETHANOL: Alcohol, Ethyl (B): <15 mg/dL (ref <15)

## 2024-01-09 MED ORDER — SODIUM CHLORIDE 0.9 % IV BOLUS
1000.0000 mL | Freq: Once | INTRAVENOUS | Status: AC
  Administered 2024-01-09: 1000 mL via INTRAVENOUS

## 2024-01-09 MED ORDER — SODIUM CHLORIDE 0.9 % IV BOLUS
1000.0000 mL | Freq: Once | INTRAVENOUS | Status: AC
  Administered 2024-01-10: 1000 mL via INTRAVENOUS

## 2024-01-09 MED ORDER — IOHEXOL 350 MG/ML SOLN
75.0000 mL | Freq: Once | INTRAVENOUS | Status: AC | PRN
  Administered 2024-01-09: 75 mL via INTRAVENOUS

## 2024-01-09 MED ORDER — VANCOMYCIN HCL IN DEXTROSE 1-5 GM/200ML-% IV SOLN: 1000.0000 mg | Freq: Once | INTRAVENOUS | Status: DC

## 2024-01-09 MED ORDER — VANCOMYCIN HCL 1500 MG/300ML IV SOLN
1500.0000 mg | Freq: Once | INTRAVENOUS | Status: AC
  Administered 2024-01-10: 1500 mg via INTRAVENOUS
  Filled 2024-01-09: qty 300

## 2024-01-09 MED ORDER — METRONIDAZOLE 500 MG/100ML IV SOLN
500.0000 mg | Freq: Once | INTRAVENOUS | Status: AC
  Administered 2024-01-10: 500 mg via INTRAVENOUS
  Filled 2024-01-09: qty 100

## 2024-01-09 MED ORDER — SODIUM CHLORIDE 0.9 % IV SOLN
2.0000 g | Freq: Once | INTRAVENOUS | Status: AC
  Administered 2024-01-10: 2 g via INTRAVENOUS
  Filled 2024-01-09: qty 12.5

## 2024-01-09 MED ORDER — ACETAMINOPHEN 500 MG PO TABS
1000.0000 mg | ORAL_TABLET | Freq: Once | ORAL | Status: AC
  Administered 2024-01-10: 1000 mg via ORAL
  Filled 2024-01-09: qty 2
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
## 2024-01-10 DIAGNOSIS — R6510 Systemic inflammatory response syndrome (SIRS) of non-infectious origin without acute organ dysfunction: Secondary | ICD-10-CM | POA: Diagnosis present

## 2024-01-10 LAB — BASIC METABOLIC PANEL WITH GFR
Anion gap: 6 (ref 5–15)
BUN: 5 mg/dL — ABNORMAL LOW (ref 6–20)
CO2: 25 mmol/L (ref 22–32)
Calcium: 8.1 mg/dL — ABNORMAL LOW (ref 8.9–10.3)
Chloride: 104 mmol/L (ref 98–111)
Creatinine, Ser: 0.96 mg/dL (ref 0.61–1.24)
GFR, Estimated: >60 mL/min (ref >60)
Glucose, Bld: 138 mg/dL — ABNORMAL HIGH (ref 70–99)
Potassium: 3.1 mmol/L — ABNORMAL LOW (ref 3.5–5.1)
Sodium: 135 mmol/L (ref 135–145)

## 2024-01-10 LAB — RESP PANEL BY RT-PCR (RSV, FLU A&B, COVID)  RVPGX2
Influenza A by PCR: NEGATIVE
Influenza B by PCR: NEGATIVE
Resp Syncytial Virus by PCR: NEGATIVE
SARS Coronavirus 2 by RT PCR: NEGATIVE

## 2024-01-10 LAB — CBC
HCT: 37.7 % — ABNORMAL LOW (ref 39.0–52.0)
Hemoglobin: 12.1 g/dL — ABNORMAL LOW (ref 13.0–17.0)
MCH: 25.6 pg — ABNORMAL LOW (ref 26.0–34.0)
MCHC: 32.1 g/dL (ref 30.0–36.0)
MCV: 79.7 fL — ABNORMAL LOW (ref 80.0–100.0)
Platelets: 258 K/uL (ref 150–400)
RBC: 4.73 MIL/uL (ref 4.22–5.81)
RDW: 15.9 % — ABNORMAL HIGH (ref 11.5–15.5)
WBC: 9.5 K/uL (ref 4.0–10.5)
nRBC: 0 % (ref 0.0–0.2)

## 2024-01-10 LAB — BLOOD CULTURE ID PANEL (REFLEXED) - BCID2

## 2024-01-10 LAB — LACTIC ACID, PLASMA
Lactic Acid, Venous: >9 mmol/L (ref 0.5–1.9)
Lactic Acid, Venous: 1.2 mmol/L (ref 0.5–1.9)

## 2024-01-10 LAB — MAGNESIUM: Magnesium: 1.8 mg/dL (ref 1.7–2.4)

## 2024-01-10 LAB — PHOSPHORUS: Phosphorus: 2.8 mg/dL (ref 2.5–4.6)

## 2024-01-10 LAB — MRSA NEXT GEN BY PCR, NASAL: MRSA by PCR Next Gen: NOT DETECTED

## 2024-01-10 MED ORDER — MELATONIN 5 MG PO TABS: 5.0000 mg | ORAL_TABLET | Freq: Every evening | ORAL | Status: DC | PRN

## 2024-01-10 MED ORDER — SODIUM CHLORIDE 0.9 % IV SOLN
2.0000 g | Freq: Three times a day (TID) | INTRAVENOUS | Status: DC
  Administered 2024-01-10 – 2024-01-11 (×3): 2 g via INTRAVENOUS
  Filled 2024-01-10 (×3): qty 12.5

## 2024-01-10 MED ORDER — POLYETHYLENE GLYCOL 3350 17 G PO PACK: 17.0000 g | PACK | Freq: Every day | ORAL | Status: DC | PRN

## 2024-01-10 MED ORDER — ENOXAPARIN SODIUM 40 MG/0.4ML IJ SOSY
40.0000 mg | PREFILLED_SYRINGE | INTRAMUSCULAR | Status: DC
  Administered 2024-01-10 – 2024-01-11 (×2): 40 mg via SUBCUTANEOUS
  Filled 2024-01-10 (×2): qty 0.4

## 2024-01-10 MED ORDER — PROCHLORPERAZINE EDISYLATE 10 MG/2ML IJ SOLN: 5.0000 mg | Freq: Four times a day (QID) | INTRAMUSCULAR | Status: DC | PRN

## 2024-01-10 MED ORDER — METRONIDAZOLE 500 MG/100ML IV SOLN
500.0000 mg | Freq: Two times a day (BID) | INTRAVENOUS | Status: DC
  Administered 2024-01-10 – 2024-01-11 (×2): 500 mg via INTRAVENOUS
  Filled 2024-01-10 (×2): qty 100

## 2024-01-10 MED ORDER — BICTEGRAVIR-EMTRICITAB-TENOFOV 50-200-25 MG PO TABS
1.0000 | ORAL_TABLET | Freq: Every day | ORAL | Status: DC
Start: 2024-01-10 — End: 2024-01-11
  Administered 2024-01-10 – 2024-01-11 (×2): 1 via ORAL
  Filled 2024-01-10 (×2): qty 1

## 2024-01-10 MED ORDER — VALACYCLOVIR HCL 500 MG PO TABS
1000.0000 mg | ORAL_TABLET | Freq: Two times a day (BID) | ORAL | Status: DC
  Administered 2024-01-10 – 2024-01-11 (×3): 1000 mg via ORAL
  Filled 2024-01-10 (×4): qty 2

## 2024-01-10 MED ORDER — ACETAMINOPHEN 325 MG PO TABS: 650.0000 mg | ORAL_TABLET | Freq: Four times a day (QID) | ORAL | Status: DC | PRN

## 2024-01-10 MED ORDER — POTASSIUM CHLORIDE CRYS ER 20 MEQ PO TBCR
20.0000 meq | EXTENDED_RELEASE_TABLET | Freq: Two times a day (BID) | ORAL | Status: DC
  Administered 2024-01-10: 20 meq via ORAL
  Filled 2024-01-10: qty 1

## 2024-01-10 MED ORDER — VANCOMYCIN HCL IN DEXTROSE 1-5 GM/200ML-% IV SOLN
1000.0000 mg | Freq: Two times a day (BID) | INTRAVENOUS | Status: DC
  Administered 2024-01-10 – 2024-01-11 (×2): 1000 mg via INTRAVENOUS
  Filled 2024-01-10 (×2): qty 200

## 2024-01-10 MED ORDER — POTASSIUM CHLORIDE CRYS ER 20 MEQ PO TBCR
40.0000 meq | EXTENDED_RELEASE_TABLET | Freq: Two times a day (BID) | ORAL | Status: AC
  Administered 2024-01-10 (×2): 40 meq via ORAL
  Filled 2024-01-10 (×2): qty 2

## 2024-01-10 MED ORDER — LACTATED RINGERS IV SOLN: INTRAVENOUS | Status: AC
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
## 2024-01-10 NOTE — Progress Notes (Addendum)
 PHARMACY - PHYSICIAN COMMUNICATION CRITICAL VALUE ALERT - BLOOD CULTURE IDENTIFICATION (BCID)  Blake Chambers is an 33 y.o. male who presented to [facility] on 01/09/2024 with a chief complaint of possible syncope   Assessment:  1 set of Blood Cx + GPR, second set Blood Cx 1/2 bottles with staph species  Name of physician (or Provider) Contacted: Dr. Dorinda  Current antibiotics: vancomycin  and cefepime   Changes to prescribed antibiotics recommended:  NO med changes for now    [employer].D. CPP, BCPS Clinical Pharmacist [phone] 01/10/2024 4:45 PM

## 2024-01-10 NOTE — Progress Notes (Addendum)
 Pharmacy Antibiotic Note  Blake Chambers is a 33 y.o. male admitted on 01/09/2024 with sepsis.  Pharmacy has been consulted for vancomycin  dosing.  Also resuming cefepime  and flagyl .  Plan: Vancomycin  1000 IV every 12 hours. eAUC 427.1 (based on Scr 0.96, Vd 0.72). Goal trough 15-20 mcg/mL. Cefepime  2g IV q8h.  Flagyl  500 mg IV q 12 hrs.  Height: 5' 9 (175.3 cm) Weight: 68.9 kg (152 lb) IBW/kg (Calculated) : 70.7  Temp (24hrs), Avg:99.1 F (37.3 C), Min:97.7 F (36.5 C), Max:100.7 F (38.2 C)  Recent Labs  Lab 01/09/24 2037 01/09/24 2342 01/09/24 2355 01/10/24 0320  WBC 9.3  --   --  9.5  CREATININE 0.99  --  1.00 0.96  LATICACIDVEN  --  2.7*  --   --     Estimated Creatinine Clearance: 105.7 mL/min (by C-G formula based on SCr of 0.96 mg/dL).    Allergies  Allergen Reactions   Blueberry Flavoring Agent (Non-Screening) Hives   Pineapple Anaphylaxis   Tenofovir  Disoproxil Other (See Comments)    Intolerance, increase in creatinine   (TRUVADA)    Kiwi Extract Swelling    Antimicrobials this admission: Vancomycin  7/26 >>  Cefepime  7/26 >>   Dose adjustments this admission:  Microbiology results: 7/26 BCx: p 7/27 MRSA PCR: p  Thank you for allowing pharmacy to be a part of this patient's care.  Elma Fail, PharmD PGY1 Clinical Pharmacist [facility]  01/10/2024 1:48 PM

## 2024-01-10 NOTE — Plan of Care (Signed)
   Problem: Education: Goal: Knowledge of General Education information will improve Description Including pain rating scale, medication(s)/side effects and non-pharmacologic comfort measures Outcome: Progressing
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
## 2024-01-11 DIAGNOSIS — J40 Bronchitis, not specified as acute or chronic: Secondary | ICD-10-CM | POA: Diagnosis present

## 2024-01-11 DIAGNOSIS — F319 Bipolar disorder, unspecified: Secondary | ICD-10-CM | POA: Diagnosis present

## 2024-01-11 DIAGNOSIS — A4189 Other specified sepsis: Secondary | ICD-10-CM | POA: Diagnosis present

## 2024-01-11 DIAGNOSIS — R7881 Bacteremia: Secondary | ICD-10-CM | POA: Diagnosis not present

## 2024-01-11 DIAGNOSIS — E872 Acidosis, unspecified: Secondary | ICD-10-CM | POA: Diagnosis present

## 2024-01-11 DIAGNOSIS — R911 Solitary pulmonary nodule: Secondary | ICD-10-CM | POA: Diagnosis present

## 2024-01-11 DIAGNOSIS — E876 Hypokalemia: Secondary | ICD-10-CM | POA: Diagnosis present

## 2024-01-11 DIAGNOSIS — Z888 Allergy status to other drugs, medicaments and biological substances status: Secondary | ICD-10-CM | POA: Diagnosis not present

## 2024-01-11 DIAGNOSIS — F17210 Nicotine dependence, cigarettes, uncomplicated: Secondary | ICD-10-CM | POA: Diagnosis present

## 2024-01-11 DIAGNOSIS — Z1152 Encounter for screening for COVID-19: Secondary | ICD-10-CM | POA: Diagnosis not present

## 2024-01-11 DIAGNOSIS — Z79899 Other long term (current) drug therapy: Secondary | ICD-10-CM | POA: Diagnosis not present

## 2024-01-11 DIAGNOSIS — R55 Syncope and collapse: Secondary | ICD-10-CM | POA: Diagnosis present

## 2024-01-11 DIAGNOSIS — X58XXXA Exposure to other specified factors, initial encounter: Secondary | ICD-10-CM | POA: Diagnosis present

## 2024-01-11 DIAGNOSIS — Z5901 Sheltered homelessness: Secondary | ICD-10-CM | POA: Diagnosis not present

## 2024-01-11 DIAGNOSIS — Z91148 Patient's other noncompliance with medication regimen for other reason: Secondary | ICD-10-CM | POA: Diagnosis not present

## 2024-01-11 DIAGNOSIS — Z21 Asymptomatic human immunodeficiency virus [HIV] infection status: Secondary | ICD-10-CM | POA: Diagnosis present

## 2024-01-11 DIAGNOSIS — S2231XA Fracture of one rib, right side, initial encounter for closed fracture: Secondary | ICD-10-CM | POA: Diagnosis present

## 2024-01-11 DIAGNOSIS — R6510 Systemic inflammatory response syndrome (SIRS) of non-infectious origin without acute organ dysfunction: Secondary | ICD-10-CM | POA: Diagnosis not present

## 2024-01-11 DIAGNOSIS — Z5329 Procedure and treatment not carried out because of patient's decision for other reasons: Secondary | ICD-10-CM | POA: Diagnosis present

## 2024-01-11 DIAGNOSIS — Z91018 Allergy to other foods: Secondary | ICD-10-CM | POA: Diagnosis not present

## 2024-01-11 LAB — CBC
HCT: 42 % (ref 39.0–52.0)
Hemoglobin: 13.3 g/dL (ref 13.0–17.0)
MCH: 25.3 pg — ABNORMAL LOW (ref 26.0–34.0)
MCHC: 31.7 g/dL (ref 30.0–36.0)
MCV: 79.8 fL — ABNORMAL LOW (ref 80.0–100.0)
Platelets: 290 K/uL (ref 150–400)
RBC: 5.26 MIL/uL (ref 4.22–5.81)
RDW: 15.8 % — ABNORMAL HIGH (ref 11.5–15.5)
WBC: 8 K/uL (ref 4.0–10.5)
nRBC: 0 % (ref 0.0–0.2)

## 2024-01-11 LAB — T-HELPER CELLS (CD4) COUNT (NOT AT ARMC)
CD4 % Helper T Cell: 28 % — ABNORMAL LOW (ref 33–65)
CD4 T Cell Abs: 551 /uL (ref 400–1790)

## 2024-01-11 LAB — BASIC METABOLIC PANEL WITH GFR
Anion gap: 9 (ref 5–15)
BUN: 5 mg/dL — ABNORMAL LOW (ref 6–20)
CO2: 23 mmol/L (ref 22–32)
Calcium: 8.7 mg/dL — ABNORMAL LOW (ref 8.9–10.3)
Chloride: 102 mmol/L (ref 98–111)
Creatinine, Ser: 0.9 mg/dL (ref 0.61–1.24)
GFR, Estimated: >60 mL/min (ref >60)
Glucose, Bld: 86 mg/dL (ref 70–99)
Potassium: 4.1 mmol/L (ref 3.5–5.1)
Sodium: 134 mmol/L — ABNORMAL LOW (ref 135–145)

## 2024-01-11 LAB — HIV-1 RNA QUANT-NO REFLEX-BLD
HIV 1 RNA Quant: 21000 {copies}/mL
LOG10 HIV-1 RNA: 4.322 {Log_copies}/mL

## 2024-01-11 NOTE — TOC Progression Note (Addendum)
 Transition of Care [facility]) - Progression Note    Patient Details  Name: Blake Chambers MRN: [id number] Date of Birth: 09-Oct-1990  [facility]) CM/SW Contact  Jashae Wiggs A [place], LCSW Phone Number: 01/11/2024, 2:33 PM  Clinical Narrative:     CSW made attempt to see pt to complete assessment and provide support counseling regarding pt's substance use disorder. Pt's room was empty when CSW attempted to meet pt at bedside. Nursing notified CSW that pt left AMA because pt reported needing to care for his dog in the community.   CSW had provided resources in pt's AVS to address SDOH of housing, food and transportation needs before pt left AMA.                       Expected Discharge Plan and Services                                               Social Drivers of Health (SDOH) Interventions SDOH Screenings   Food Insecurity: Food Insecurity Present (01/10/2024)  Housing: High Risk (01/10/2024)  Transportation Needs: Unmet Transportation Needs (01/10/2024)  Utilities: At Risk (01/10/2024)  Depression (PHQ2-9): High Risk (11/27/2023)  Tobacco Use: High Risk (12/08/2023)    Readmission Risk Interventions     No data to display
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
## 2024-01-11 NOTE — Progress Notes (Signed)
 Patient signed out AMA at 1415. Dr saw patient this morning and patient had decided to stay, but he reported that he needed to get home to his dog, because the dog was out of food and didn't have anyone to buy food or care for the dog.  I explained to the patient that he is still not medically cleared to leave because he has not yet received his full treatment, and requires more antibiotics.  He stated, I know that and if I get bad again, I'll come back, but I have to go take care of my dog. I also explained to the patient that once the AMA is signed, we cannot help him leave the building, get transportation, give him any prescriptions for medication, and I informed him that it was 100 degrees outside.  Patient acknowledged that he understood.

## 2024-01-12 LAB — CULTURE, BLOOD (ROUTINE X 2)

## 2024-02-01 ENCOUNTER — Other Ambulatory Visit (HOSPITAL_COMMUNITY): Payer: Self-pay

## 2024-02-08 ENCOUNTER — Emergency Department (HOSPITAL_COMMUNITY)
Admission: EM | Admit: 2024-02-08 | Discharge: 2024-02-08 | Disposition: A | Discharge status: Home / Self Care | Payer: MEDICAID | Attending: Emergency Medicine | Admitting: Emergency Medicine

## 2024-02-08 ENCOUNTER — Other Ambulatory Visit: Payer: Self-pay

## 2024-02-08 ENCOUNTER — Emergency Department (HOSPITAL_COMMUNITY): Payer: MEDICAID

## 2024-02-08 ENCOUNTER — Encounter (HOSPITAL_COMMUNITY): Payer: Self-pay

## 2024-02-08 DIAGNOSIS — Z720 Tobacco use: Secondary | ICD-10-CM | POA: Diagnosis not present

## 2024-02-08 DIAGNOSIS — Z5900 Homelessness unspecified: Secondary | ICD-10-CM | POA: Diagnosis not present

## 2024-02-08 DIAGNOSIS — M25512 Pain in left shoulder: Secondary | ICD-10-CM | POA: Diagnosis present

## 2024-02-08 DIAGNOSIS — E119 Type 2 diabetes mellitus without complications: Secondary | ICD-10-CM | POA: Diagnosis not present

## 2024-02-08 LAB — CBG MONITORING, ED
Glucose-Capillary: 118 mg/dL — ABNORMAL HIGH (ref 70–99)
Glucose-Capillary: 102 mg/dL — ABNORMAL HIGH (ref 70–99)

## 2024-02-08 MED ORDER — IBUPROFEN 800 MG PO TABS
800.0000 mg | ORAL_TABLET | Freq: Once | ORAL | Status: AC
  Administered 2024-02-08: 800 mg via ORAL
  Filled 2024-02-08: qty 1

## 2024-02-08 NOTE — Discharge Instructions (Addendum)
 I have provided you with the contact information for [facility], please schedule follow-up with them for management of your chronic illnesses since you do not have a primary care provider.  Continue ibuprofen /Tylenol  as needed for shoulder pain.  Return to the emergency department if your symptoms worsen.
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
## 2024-03-17 ENCOUNTER — Encounter (HOSPITAL_COMMUNITY): Payer: Self-pay | Admitting: *Deleted

## 2024-03-17 ENCOUNTER — Emergency Department (HOSPITAL_COMMUNITY)
Admission: EM | Admit: 2024-03-17 | Discharge: 2024-03-17 | Discharge status: Against Medical Advice | Attending: Emergency Medicine | Admitting: Emergency Medicine

## 2024-03-17 ENCOUNTER — Other Ambulatory Visit: Payer: Self-pay

## 2024-03-17 DIAGNOSIS — M79642 Pain in left hand: Secondary | ICD-10-CM | POA: Diagnosis present

## 2024-03-17 DIAGNOSIS — Z5321 Procedure and treatment not carried out due to patient leaving prior to being seen by health care provider: Secondary | ICD-10-CM | POA: Diagnosis not present

## 2024-03-17 DIAGNOSIS — M79641 Pain in right hand: Secondary | ICD-10-CM | POA: Diagnosis not present

## 2024-03-17 NOTE — ED Notes (Signed)
 Pt walked out of ED post vitals being taken.

## 2024-03-17 NOTE — ED Triage Notes (Signed)
 The pt is c/o lt  hand pain  he has bi-lateral hand pain for years the change in the weather tonight has made the pain worse

## 2024-03-17 NOTE — ED Notes (Signed)
 Pt not found in lobby for vitals re-check.

## 2024-03-19 ENCOUNTER — Emergency Department (HOSPITAL_COMMUNITY)
Admission: EM | Admit: 2024-03-19 | Discharge: 2024-03-19 | Disposition: A | Discharge status: Home / Self Care | Payer: MEDICAID | Attending: Emergency Medicine | Admitting: Emergency Medicine

## 2024-03-19 ENCOUNTER — Other Ambulatory Visit: Payer: Self-pay

## 2024-03-19 ENCOUNTER — Encounter (HOSPITAL_COMMUNITY): Payer: Self-pay | Admitting: Emergency Medicine

## 2024-03-19 ENCOUNTER — Other Ambulatory Visit (HOSPITAL_COMMUNITY): Payer: Self-pay

## 2024-03-19 DIAGNOSIS — M79672 Pain in left foot: Secondary | ICD-10-CM | POA: Diagnosis present

## 2024-03-19 DIAGNOSIS — Z21 Asymptomatic human immunodeficiency virus [HIV] infection status: Secondary | ICD-10-CM | POA: Insufficient documentation

## 2024-03-19 DIAGNOSIS — M79632 Pain in left forearm: Secondary | ICD-10-CM | POA: Diagnosis not present

## 2024-03-19 MED ORDER — ACETAMINOPHEN 500 MG PO TABS
1000.0000 mg | ORAL_TABLET | Freq: Once | ORAL | Status: DC
  Filled 2024-03-19: qty 2

## 2024-03-19 MED ORDER — ACETAMINOPHEN 500 MG PO TABS
500.0000 mg | ORAL_TABLET | Freq: Four times a day (QID) | ORAL | 0 refills | Status: DC | PRN
  Filled 2024-03-19: qty 30, 8d supply, fill #0

## 2024-03-19 NOTE — ED Notes (Signed)
 Patient is sleep at this time and responding to voice but not speaking clearly and falls back asleep. RR are even and unlabored.
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
## 2024-03-19 NOTE — Discharge Instructions (Addendum)
 You may take Tylenol  as prescribed as needed for pain to your left foot and left forearm.  Follow-up closely with your doctor for further care.

## 2024-03-31 ENCOUNTER — Other Ambulatory Visit (HOSPITAL_COMMUNITY): Payer: Self-pay

## 2024-04-15 ENCOUNTER — Emergency Department (HOSPITAL_COMMUNITY)
Admission: EM | Admit: 2024-04-15 | Discharge: 2024-04-15 | Disposition: A | Discharge status: Home / Self Care | Source: Home / Self Care | Attending: Emergency Medicine | Admitting: Emergency Medicine

## 2024-04-15 ENCOUNTER — Emergency Department (HOSPITAL_COMMUNITY)
Admission: EM | Admit: 2024-04-15 | Discharge: 2024-04-15 | Disposition: A | Discharge status: Home / Self Care | Attending: Emergency Medicine | Admitting: Emergency Medicine

## 2024-04-15 ENCOUNTER — Other Ambulatory Visit (HOSPITAL_COMMUNITY): Payer: Self-pay

## 2024-04-15 ENCOUNTER — Emergency Department (HOSPITAL_COMMUNITY)

## 2024-04-15 ENCOUNTER — Other Ambulatory Visit: Payer: Self-pay

## 2024-04-15 DIAGNOSIS — M79672 Pain in left foot: Secondary | ICD-10-CM | POA: Insufficient documentation

## 2024-04-15 DIAGNOSIS — Z21 Asymptomatic human immunodeficiency virus [HIV] infection status: Secondary | ICD-10-CM | POA: Insufficient documentation

## 2024-04-15 DIAGNOSIS — Z5900 Homelessness unspecified: Secondary | ICD-10-CM | POA: Insufficient documentation

## 2024-04-15 DIAGNOSIS — M7989 Other specified soft tissue disorders: Secondary | ICD-10-CM | POA: Diagnosis not present

## 2024-04-15 MED ORDER — CYCLOBENZAPRINE HCL 10 MG PO TABS
10.0000 mg | ORAL_TABLET | Freq: Once | ORAL | Status: AC
Start: 2024-04-15 — End: 2024-04-15
  Administered 2024-04-15: 10 mg via ORAL
  Filled 2024-04-15: qty 1

## 2024-04-15 MED ORDER — LIDOCAINE 5 % EX PTCH
1.0000 | MEDICATED_PATCH | CUTANEOUS | Status: DC
  Filled 2024-04-15: qty 1

## 2024-04-15 MED ORDER — ACETAMINOPHEN 500 MG PO TABS
500.0000 mg | ORAL_TABLET | Freq: Four times a day (QID) | ORAL | 0 refills | Status: AC | PRN
  Filled 2024-04-15: qty 30, 8d supply, fill #0

## 2024-04-15 MED ORDER — ACETAMINOPHEN 500 MG PO TABS
1000.0000 mg | ORAL_TABLET | Freq: Once | ORAL | Status: AC
  Administered 2024-04-15: 1000 mg via ORAL
  Filled 2024-04-15: qty 2

## 2024-04-15 MED ORDER — ACETAMINOPHEN 325 MG PO TABS
650.0000 mg | ORAL_TABLET | Freq: Once | ORAL | Status: AC
  Administered 2024-04-15: 650 mg via ORAL
  Filled 2024-04-15: qty 2

## 2024-04-15 MED ORDER — LIDOCAINE 5 % EX PTCH
1.0000 | MEDICATED_PATCH | CUTANEOUS | Status: DC
  Administered 2024-04-15: 1 via TRANSDERMAL
  Filled 2024-04-15: qty 1

## 2024-04-15 MED ORDER — KETOROLAC TROMETHAMINE 15 MG/ML IJ SOLN
15.0000 mg | Freq: Once | INTRAMUSCULAR | Status: DC
  Filled 2024-04-15: qty 1

## 2024-04-15 NOTE — ED Notes (Signed)
 Patient discharged to home with self. Patient given written and oral discharge instructions. Patient needed additional verbal home care and follow up information. Patient medicated. Patient given brace and crutches. Patient given home medications. Patient given follow up information. Patient given food and drink. Security walked patient to ER lobby. Patient ambulatory to lobby with crutches. Patient breathing without difficulty.

## 2024-04-15 NOTE — ED Notes (Signed)
Patient given something to eat and drink

## 2024-04-15 NOTE — Discharge Instructions (Addendum)
 Your imaging earlier this morning was reassuring.  Please follow-up with orthopedics.  You can also follow-up with primary care.  Seek emergency care if experiencing any new or worsening symptoms.  Alternating between 650 mg Tylenol  and 400 mg Advil : The best way to alternate taking Acetaminophen  (example Tylenol ) and Ibuprofen  (example Advil /Motrin ) is to take them 3 hours apart. For example, if you take ibuprofen  at 6 am you can then take Tylenol  at 9 am. You can continue this regimen throughout the day, making sure you do not exceed the recommended maximum dose for each drug.

## 2024-04-15 NOTE — ED Notes (Signed)
 Security escorted patient to ER lobby.

## 2024-04-15 NOTE — ED Notes (Signed)
 Patient seen ambulating to the restroom.

## 2024-04-15 NOTE — ED Notes (Addendum)
 Pt is screaming and rolling around in bed complaining of pain in his foot. Described as cramping. Discussed with patient how to stand and stretch to help foot cramp until he could be assessed by physician. Ice pack given as well. Pt would not allow me to finish his temp.

## 2024-04-15 NOTE — Discharge Instructions (Addendum)
 As discussed, your x-ray showed no obvious fracture or dislocation.  Will send in with medicine to use as needed for pain.  Recommend follow-up with primary care/orthopedic for reassessment.

## 2024-04-15 NOTE — ED Triage Notes (Signed)
 Pt arrives via EMS to triage with complaints of LEFT foot pain that began this morning. Pt was seen at [facility], and discharged with no acute findings.
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
## 2024-04-15 NOTE — ED Notes (Signed)
 Security called to escort patient to lobby

## 2024-04-15 NOTE — ED Provider Notes (Signed)
 [place] EMERGENCY DEPARTMENT AT [facility] Provider Note   CSN: [id number] Arrival date & time: 04/15/24  1008     Patient presents with: Foot Pain   Blake Chambers is a 33 y.o. male with PMHx HIV, homelessness, bipolar 1 disorder who presents to ED concerned for left foot pain. Patient stating that pain was present when he woke up this morning. Denies any trauma. Patient stating that he feels something moving inside his foot. Patient was seen at another  ED earlier this morning and had reassuring workup. Patient was provided with ankle brace and crutches earlier this morning but has since stopped using them.     Foot Pain       Prior to Admission medications   Medication Sig Start Date End Date Taking? Authorizing Provider  acetaminophen  (TYLENOL ) 500 MG tablet Take 1 tablet (500 mg total) by mouth every 6 (six) hours as needed. 04/15/24   Silver Wonda LABOR, PA  bictegravir-emtricitabine -tenofovir  AF (BIKTARVY ) 50-200-25 MG TABS tablet Take 1 tablet by mouth daily. Patient not taking: Reported on 01/10/2024 11/27/23   Calone, Gregory D, FNP  clotrimazole  (CLOTRIMAZOLE  AF) 1 % cream Apply 1 Application topically 2 (two) times daily. Patient not taking: Reported on 01/10/2024 11/27/23   Calone, Gregory D, FNP  ondansetron  (ZOFRAN -ODT) 8 MG disintegrating tablet Take 1 tablet (8 mg total) by mouth every 8 (eight) hours as needed for nausea Patient not taking: Reported on 01/10/2024 05/23/23   Palumbo, April, MD  pantoprazole  (PROTONIX ) 40 MG tablet Take 1 tablet (40 mg total) by mouth daily. Patient not taking: Reported on 01/10/2024 09/11/23   Haze Lonni PARAS, MD  valACYclovir  (VALTREX ) 1000 MG tablet Take 1 tablet (1,000 mg total) by mouth 2 (two) times daily. Patient not taking: Reported on 01/10/2024 11/27/23   Calone, Gregory D, FNP    Allergies: Blueberry flavoring agent (non-screening), Pineapple, Tenofovir  disoproxil, and Kiwi extract    Review of Systems   Musculoskeletal:        Foot pain    Updated Vital Signs BP (!) 141/106 (BP Location: Right Arm)   Pulse (!) 58   Temp 98.2 F (36.8 C) (Oral)   Resp 18   Ht 5' 9 (1.753 m)   Wt 68 kg   SpO2 99%   BMI 22.15 kg/m   Physical Exam Vitals and nursing note reviewed.  Constitutional:      General: He is not in acute distress.    Appearance: He is not ill-appearing or toxic-appearing.  HENT:     Head: Normocephalic and atraumatic.  Eyes:     General: No scleral icterus.       Right eye: No discharge.        Left eye: No discharge.     Conjunctiva/sclera: Conjunctivae normal.  Cardiovascular:     Rate and Rhythm: Normal rate.  Pulmonary:     Effort: Pulmonary effort is normal.  Abdominal:     General: Abdomen is flat.  Musculoskeletal:     Comments: Left foot: very mild/minimal swelling of dorsum of foot between navicular area and ankle. No erythema, fluctuance, skin lesions, or increased warmth. +2 pedal pulse. Sensation to light touch intact. Area non-tense. Active ROM intact.   Skin:    General: Skin is warm and dry.  Neurological:     General: No focal deficit present.     Mental Status: He is alert. Mental status is at baseline.  Psychiatric:        Mood and  Affect: Mood normal.        Behavior: Behavior normal.     (all labs ordered are listed, but only abnormal results are displayed) Labs Reviewed - No data to display  EKG: None  Radiology: DG Foot Complete Left Result Date: 04/15/2024 EXAM: 3 OR MORE VIEW(S) XRAY OF THE LEFT FOOT 04/15/2024 07:26:16 AM COMPARISON: None available. CLINICAL HISTORY: pain pain FINDINGS: BONES AND JOINTS: There is no evidence of fracture or dislocation. No focal pathologic bone lesion is seen. There is mild hallux valgus with otherwise normal interosseous alignment. There is a small noninflammatory dorsal calcaneal spur at the achilles tendon insertion. SOFT TISSUES: There is mild soft tissue fullness in the midfoot and  forefoot. No radiopaque foreign body or soft tissue gas is seen. IMPRESSION: 1. No acute fracture or dislocation. 2. Mild hallux valgus. 3. Small dorsal calcaneal spur at the Achilles tendon insertion. 4. Mild soft tissue fullness in the midfoot and forefoot. Electronically signed by: Francis Quam MD 04/15/2024 07:55 AM EDT RP Workstation: [id number]     Procedures   Medications Ordered in the ED  acetaminophen  (TYLENOL ) tablet 650 mg (has no administration in time range)  lidocaine  (LIDODERM ) 5 % 1 patch (has no administration in time range)                                    Medical Decision Making  This patient presents to the ED for concern of foot pain, this involves an extensive number of treatment options, and is a complaint that carries with it a high risk of complications and morbidity.  The differential diagnosis includes hemarthrosis, gout, septic joint, fracture, tendonitis, carpal tunnel syndrome, muscle strain, bursitis, compartment syndrome   Co morbidities that complicate the patient evaluation  HIV, homelessness, bipolar 1 disorder    Additional history obtained:  10/31 left foot x-ray: No acute fracture or dislocation.  Mild hallux valgus.  Small dorsal calcaneal spur at the Achilles tendon insertion.  Mild soft tissue fullness in the midfoot and forefoot.   Problem List / ED Course / Critical interventions / Medication management  Presents to ED concern for left foot pain.  Atraumatic.  Provided with crutches and ankle brace earlier this morning which patient has since gotten rid of.  Exam reassuring.  Patient afebrile with stable vitals.  X-ray this morning without acute osseous process. Provided patient with pain management. Recommended following up with ortho or PCP.  I have reviewed the patients home medicines and have made adjustments as needed The patient has been appropriately medically screened and/or stabilized in the ED. I have low suspicion for any  other emergent medical condition which would require further screening, evaluation or treatment in the ED or require inpatient management. At time of discharge the patient is hemodynamically stable and in no acute distress. Educated on strict return precautions for returning to the emergency department.   Ddx these are considered less likely due to history of present illness and physical exam -gout: no warmth or erythema; ROM intact  -septic joint: afebrile; no warmth or erythema; no skin changes; ROM intact  -fracture: xray without concern  -compartment syndrome: area not tense; neurovascularly intact   Social Determinants of Health:  Homeless      Final diagnoses:  Foot pain, left    ED Discharge Orders     None          Hoy Nidia FALCON,  PA-C 04/15/24 1154    Charlyn Sora, MD 04/16/24 [phone]

## 2024-04-15 NOTE — ED Notes (Signed)
 Provider contacted about patient's additional medication request.

## 2024-04-15 NOTE — Progress Notes (Signed)
 Orthopedic Tech Progress Note Patient Details:  Blake Chambers Jan 19, 1991 [id number]  Ortho Devices Type of Ortho Device: ASO Ortho Device/Splint Location: LLE Ortho Device/Splint Interventions: Ordered, Application, Adjustment   Post Interventions Patient Tolerated: Fair Instructions Provided: Adjustment of device, Care of device  [name] F [name] 04/15/2024, 9:44 AM

## 2024-04-15 NOTE — ED Notes (Signed)
 Ortho tech contacted about crutches and ankle brace

## 2024-04-15 NOTE — ED Triage Notes (Signed)
 Pt is rolling around in bed saying he is in so much pain. Left foot just started hurting

## 2024-04-15 NOTE — ED Notes (Signed)
 Patient refuses to take IM medication unless RN holds him down. Patient educated that is not safe and put the RN at risk for a needle stick.

## 2024-04-16 ENCOUNTER — Other Ambulatory Visit: Payer: Self-pay

## 2024-04-16 ENCOUNTER — Emergency Department (HOSPITAL_COMMUNITY): Admission: EM | Admit: 2024-04-16 | Discharge: 2024-04-16 | Disposition: A | Discharge status: Home / Self Care

## 2024-04-16 ENCOUNTER — Encounter (HOSPITAL_COMMUNITY): Payer: Self-pay | Admitting: Emergency Medicine

## 2024-04-16 DIAGNOSIS — Z21 Asymptomatic human immunodeficiency virus [HIV] infection status: Secondary | ICD-10-CM | POA: Insufficient documentation

## 2024-04-16 DIAGNOSIS — M79672 Pain in left foot: Secondary | ICD-10-CM | POA: Insufficient documentation

## 2024-04-16 DIAGNOSIS — Z5900 Homelessness unspecified: Secondary | ICD-10-CM | POA: Insufficient documentation

## 2024-04-16 MED ORDER — KETOROLAC TROMETHAMINE 15 MG/ML IJ SOLN
15.0000 mg | Freq: Once | INTRAMUSCULAR | Status: AC
  Administered 2024-04-16: 15 mg via INTRAMUSCULAR
  Filled 2024-04-16: qty 1

## 2024-04-16 MED ORDER — ACETAMINOPHEN 325 MG PO TABS
650.0000 mg | ORAL_TABLET | Freq: Once | ORAL | Status: AC
  Administered 2024-04-16: 650 mg via ORAL
  Filled 2024-04-16: qty 2

## 2024-04-16 NOTE — ED Provider Notes (Signed)
 [place] EMERGENCY DEPARTMENT AT [facility] Provider Note   CSN: [id number] Arrival date & time: 04/16/24  1623     Patient presents with: Ankle Pain   Blake Chambers is a 33 y.o. male.   Patient with history of HIV, bipolar disorder, polysubstance abuse --presents to the emergency department today for evaluation of left foot pain.  X-ray performed yesterday showed mild fullness, no bony issues.  Patient was given an ankle brace.  Patient continues to complain of swelling and pain.  He states that it feels like something is moving inside the foot.  He denies fevers to me.  Denies further injuries.  He is currently homeless.       Prior to Admission medications   Medication Sig Start Date End Date Taking? Authorizing Provider  acetaminophen  (TYLENOL ) 500 MG tablet Take 1 tablet (500 mg total) by mouth every 6 (six) hours as needed. 04/15/24   Silver Wonda LABOR, PA  bictegravir-emtricitabine -tenofovir  AF (BIKTARVY ) 50-200-25 MG TABS tablet Take 1 tablet by mouth daily. Patient not taking: Reported on 01/10/2024 11/27/23   Calone, Gregory D, FNP  clotrimazole  (CLOTRIMAZOLE  AF) 1 % cream Apply 1 Application topically 2 (two) times daily. Patient not taking: Reported on 01/10/2024 11/27/23   Calone, Gregory D, FNP  ondansetron  (ZOFRAN -ODT) 8 MG disintegrating tablet Take 1 tablet (8 mg total) by mouth every 8 (eight) hours as needed for nausea Patient not taking: Reported on 01/10/2024 05/23/23   Palumbo, April, MD  pantoprazole  (PROTONIX ) 40 MG tablet Take 1 tablet (40 mg total) by mouth daily. Patient not taking: Reported on 01/10/2024 09/11/23   Haze Lonni PARAS, MD  valACYclovir  (VALTREX ) 1000 MG tablet Take 1 tablet (1,000 mg total) by mouth 2 (two) times daily. Patient not taking: Reported on 01/10/2024 11/27/23   Calone, Gregory D, FNP    Allergies: Blueberry flavoring agent (non-screening), Pineapple, Tenofovir  disoproxil, and Kiwi extract    Review of  Systems  Updated Vital Signs There were no vitals taken for this visit.  Physical Exam Vitals and nursing note reviewed.  Constitutional:      Appearance: He is well-developed.  HENT:     Head: Normocephalic and atraumatic.  Eyes:     Conjunctiva/sclera: Conjunctivae normal.  Cardiovascular:     Pulses: Normal pulses. No decreased pulses.  Musculoskeletal:        General: Tenderness present.     Cervical back: Normal range of motion and neck supple.     Right lower leg: No edema.     Left lower leg: No edema.     Comments: Patient with generalized tenderness, mild swelling to the medial aspect of the foot.  No fluctuance or induration to suggest abscess.  There are some mild areas of erythema, but does not appear cellulitic.  No lymphangitis.  Skin:    General: Skin is warm and dry.  Neurological:     Mental Status: He is alert.     Sensory: No sensory deficit.     Comments: Motor, sensation, and vascular distal to the injury is fully intact.   Psychiatric:        Mood and Affect: Mood normal.     (all labs ordered are listed, but only abnormal results are displayed) Labs Reviewed - No data to display  EKG: None  Radiology: DG Foot Complete Left Result Date: 04/15/2024 EXAM: 3 OR MORE VIEW(S) XRAY OF THE LEFT FOOT 04/15/2024 07:26:16 AM COMPARISON: None available. CLINICAL HISTORY: pain pain FINDINGS: BONES AND JOINTS:  There is no evidence of fracture or dislocation. No focal pathologic bone lesion is seen. There is mild hallux valgus with otherwise normal interosseous alignment. There is a small noninflammatory dorsal calcaneal spur at the achilles tendon insertion. SOFT TISSUES: There is mild soft tissue fullness in the midfoot and forefoot. No radiopaque foreign body or soft tissue gas is seen. IMPRESSION: 1. No acute fracture or dislocation. 2. Mild hallux valgus. 3. Small dorsal calcaneal spur at the Achilles tendon insertion. 4. Mild soft tissue fullness in the midfoot  and forefoot. Electronically signed by: Francis Quam MD 04/15/2024 07:55 AM EDT RP Workstation: [id number]     Procedures   Medications Ordered in the ED  ketorolac  (TORADOL ) 15 MG/ML injection 15 mg (has no administration in time range)  acetaminophen  (TYLENOL ) tablet 650 mg (has no administration in time range)   ED Course  Patient seen and examined. History obtained directly from patient.  Reviewed previous ED notes.  Reviewed x-ray results from yesterday.  Reviewed previous metabolic panel, normal kidney function on last check.  Labs/EKG: None ordered  Imaging: None ordered  Medications/Fluids: Ordered: IM Toradol , p.o. acetaminophen   Most recent vital signs reviewed and are as follows: There were no vitals taken for this visit.  Initial impression: Left foot pain.  5:35 PM Reassessment performed. Patient appears stable. Very labile. Will give ace wrap.   Reviewed pertinent lab work and imaging with patient at bedside. Questions answered.   Most current vital signs reviewed and are as follows: BP (!) 168/97   Pulse (!) 102   Temp 97.9 F (36.6 C) (Oral)   Resp 18   SpO2 100%   Plan: Discharge to home.   Prescriptions written for: None  Other home care instructions discussed: Rest, continue OTC meds  ED return instructions discussed: New or worsening symptoms, worsening redness, fever, redness streaking up the leg  Follow-up instructions discussed: Patient encouraged to follow-up with their PCP in 7 days.                                   Medical Decision Making Risk OTC drugs. Prescription drug management.   Patient here with left foot pain and swelling.  He is homeless.  No focal trauma.  Considered cellulitis or infection, however exam is not consistent at this time.  X-ray without signs of air or osteomyelitis performed yesterday.  Considered CT imaging however do not feel that this is indicated given current exam.  No fever.  Pain treated with Toradol   and Tylenol .  Considered gout, but no previous history and pain is more medial rather than over the MTP so would not be classic for gout.  Patient stabilized, stable for discharge.     Final diagnoses:  Left foot pain    ED Discharge Orders     None          Desiderio Chew, PA-C 04/16/24 1738    Ula Prentice SAUNDERS, MD 04/16/24 2340

## 2024-04-16 NOTE — Discharge Instructions (Addendum)
 Please read and follow all provided instructions.  Your diagnoses today include:  1. Left foot pain     Tests performed today include: Vital signs. See below for your results today.   Medications prescribed:  Please use over-the-counter NSAID medications (ibuprofen , naproxen ) or Tylenol  (acetaminophen ) as directed on the packaging for pain -- as long as you do not have any reasons avoid these medications. Reasons to avoid NSAID medications include: weak kidneys, a history of bleeding in your stomach or gut, or uncontrolled high blood pressure or previous heart attack. Reasons to avoid Tylenol  include: liver problems or ongoing alcohol use. Never take more than 4000mg  or 8 Extra strength Tylenol  in a 24 hour period.     Take any prescribed medications only as directed.  Home care instructions:  Follow any educational materials contained in this packet Follow R.I.C.E. Protocol: R - rest your injury  I  - use ice on injury without applying directly to skin C - compress injury with bandage or splint E - elevate the injury as much as possible  Follow-up instructions: Please follow-up with your primary care provider in 1 week.   Return instructions:  Please return if your toes or feet are numb or tingling, appear gray or blue, or you have severe pain (also elevate the leg and loosen splint or wrap if you were given one) Please return to the Emergency Department if you experience worsening symptoms.  Please return if you have any other emergent concerns.  Additional Information:  Your vital signs today were: There were no vitals taken for this visit. If your blood pressure (BP) was elevated above 135/85 this visit, please have this repeated by your doctor within one month. --------------

## 2024-04-16 NOTE — Progress Notes (Signed)
 Orthopedic Tech Progress Note Patient Details:  Blake Chambers 29-Oct-1990 [id number]  Ortho Devices Type of Ortho Device: Ace wrap Ortho Device/Splint Location: LLE/ left foot and ankle Ortho Device/Splint Interventions: Ordered, Application, Adjustment   Post Interventions Patient Tolerated: Well Instructions Provided: Adjustment of device, Care of device  [name] 04/16/2024, 5:53 PM

## 2024-04-16 NOTE — ED Triage Notes (Addendum)
 BIB EMS for left ankle pain.  Seen twice yesterday for same.  Pt reports he is homeless and cannot afford the meds prescribed.  PMS intact.  Pt writhing in pain and requested mustard packs for cramping.  Has been offered heat and ice packs.  Reports taking 2 bayer aspirin prior to arrival.  Pt given 2 packs of mustard

## 2024-04-17 ENCOUNTER — Encounter (HOSPITAL_COMMUNITY): Payer: Self-pay | Admitting: Emergency Medicine

## 2024-04-17 ENCOUNTER — Emergency Department (HOSPITAL_COMMUNITY)
Admission: EM | Admit: 2024-04-17 | Discharge: 2024-04-18 | Disposition: A | Discharge status: Home / Self Care | Payer: MEDICAID | Attending: Emergency Medicine | Admitting: Emergency Medicine

## 2024-04-17 ENCOUNTER — Other Ambulatory Visit: Payer: Self-pay

## 2024-04-17 DIAGNOSIS — Z59819 Housing instability, housed unspecified: Secondary | ICD-10-CM | POA: Diagnosis not present

## 2024-04-17 DIAGNOSIS — M79672 Pain in left foot: Secondary | ICD-10-CM | POA: Insufficient documentation

## 2024-04-17 DIAGNOSIS — Z21 Asymptomatic human immunodeficiency virus [HIV] infection status: Secondary | ICD-10-CM | POA: Insufficient documentation

## 2024-04-17 NOTE — ED Triage Notes (Signed)
 Pt arrives w/ c/o L foot pain. Been seen for same recently. Arrives w/ crutches.

## 2024-04-18 LAB — CBC
HCT: 43.3 % (ref 39.0–52.0)
Hemoglobin: 13.5 g/dL (ref 13.0–17.0)
MCH: 25.6 pg — ABNORMAL LOW (ref 26.0–34.0)
MCHC: 31.2 g/dL (ref 30.0–36.0)
MCV: 82.2 fL (ref 80.0–100.0)
Platelets: 326 K/uL (ref 150–400)
RBC: 5.27 MIL/uL (ref 4.22–5.81)
RDW: 16.5 % — ABNORMAL HIGH (ref 11.5–15.5)
WBC: 8.2 K/uL (ref 4.0–10.5)
nRBC: 0 % (ref 0.0–0.2)

## 2024-04-18 MED ORDER — NAPROXEN 250 MG PO TABS
500.0000 mg | ORAL_TABLET | Freq: Once | ORAL | Status: AC
  Administered 2024-04-18: 500 mg via ORAL
  Filled 2024-04-18: qty 2

## 2024-04-18 NOTE — ED Provider Notes (Signed)
 [place] EMERGENCY DEPARTMENT AT [facility] Provider Note   CSN: [id number] Arrival date & time: 04/17/24  2324     Patient presents with: Foot Pain   Maleak Chambers is a 33 y.o. male.   33 year old male with a history of HIV (CD4 >500), bipolar affective disorder, homelessness presents to the ED for left foot pain.  Last seen 2 days ago for same.  Seen frequently in the emergency department for evaluation.  Feels that the swelling is subjectively worse compared to prior visit.  He has been using crutches to assist with ambulation as this worsens his discomfort.  He has not had any known fevers.  No trauma since last evaluated.  The history is provided by the patient. No language interpreter was used.  Foot Pain       Prior to Admission medications   Medication Sig Start Date End Date Taking? Authorizing Provider  acetaminophen  (TYLENOL ) 500 MG tablet Take 1 tablet (500 mg total) by mouth every 6 (six) hours as needed. 04/15/24   Silver Wonda LABOR, PA  bictegravir-emtricitabine -tenofovir  AF (BIKTARVY ) 50-200-25 MG TABS tablet Take 1 tablet by mouth daily. Patient not taking: Reported on 01/10/2024 11/27/23   Calone, Gregory D, FNP  clotrimazole  (CLOTRIMAZOLE  AF) 1 % cream Apply 1 Application topically 2 (two) times daily. Patient not taking: Reported on 01/10/2024 11/27/23   Calone, Gregory D, FNP  ondansetron  (ZOFRAN -ODT) 8 MG disintegrating tablet Take 1 tablet (8 mg total) by mouth every 8 (eight) hours as needed for nausea Patient not taking: Reported on 01/10/2024 05/23/23   Palumbo, April, MD  pantoprazole  (PROTONIX ) 40 MG tablet Take 1 tablet (40 mg total) by mouth daily. Patient not taking: Reported on 01/10/2024 09/11/23   Haze Lonni PARAS, MD  valACYclovir  (VALTREX ) 1000 MG tablet Take 1 tablet (1,000 mg total) by mouth 2 (two) times daily. Patient not taking: Reported on 01/10/2024 11/27/23   Calone, Gregory D, FNP    Allergies: Blueberry flavoring agent  (non-screening), Pineapple, Tenofovir  disoproxil, and Kiwi extract    Review of Systems Ten systems reviewed and are negative for acute change, except as noted in the HPI.    Updated Vital Signs BP (!) 151/92   Pulse 93   Temp 97.9 F (36.6 C) (Oral)   Resp 17   SpO2 96%   Physical Exam Vitals and nursing note reviewed.  Constitutional:      General: He is not in acute distress.    Appearance: He is well-developed. He is not diaphoretic.     Comments: Nontoxic appearing. Foul smelling.  HENT:     Head: Normocephalic and atraumatic.  Eyes:     General: No scleral icterus.    Conjunctiva/sclera: Conjunctivae normal.  Cardiovascular:     Rate and Rhythm: Normal rate and regular rhythm.     Pulses: Normal pulses.     Comments: DP pulse 2+ in the left lower extremity Pulmonary:     Effort: Pulmonary effort is normal. No respiratory distress.  Musculoskeletal:        General: Normal range of motion.     Cervical back: Normal range of motion.     Comments: Generalized edema to the left foot without deformity.  Compartments of the left lower extremity are soft, compressible.  There is no frank abscess or crepitus on examination.  Skin:    General: Skin is warm and dry.     Coloration: Skin is not pale.     Findings: No erythema or rash.  Neurological:     Mental Status: He is alert and oriented to person, place, and time.     Coordination: Coordination normal.  Psychiatric:        Behavior: Behavior normal.     (all labs ordered are listed, but only abnormal results are displayed) Labs Reviewed  CBC - Abnormal; Notable for the following components:      Result Value   MCH 25.6 (*)    RDW 16.5 (*)    All other components within normal limits    EKG: None  Radiology: No results found.   Procedures   Medications Ordered in the ED  naproxen  (NAPROSYN ) tablet 500 mg (500 mg Oral Given 04/18/24 0636)    Clinical Course as of 04/21/24 1423  Mon Apr 18, 2024   9562 I have reviewed the patient's x-ray from 04/15/2024 which shows mild tissue fullness to the left foot.  No bony changes, fracture, or evidence of osteomyelitis on x-ray. [KH]    Clinical Course User Index [KH] Keith Sor, PA-C                                 Medical Decision Making Amount and/or Complexity of Data Reviewed Labs: ordered.  Risk Prescription drug management.   This patient presents to the ED for concern of L foot pain, this involves an extensive number of treatment options, and is a complaint that carries with it a high risk of complications and morbidity.  The differential diagnosis includes fracture vs abscess vs cellulitis vs platar fasciitis vs osteomyelitis.    Co morbidities that complicate the patient evaluation  HIV Bipolar d/o   Additional history obtained:  External records from outside source obtained and reviewed including Xray L foot from 04/15/24 notable for soft tissue fullness. No radiographic evidence of osteomyelitis.   Lab Tests:  I Ordered, and personally interpreted labs.  The pertinent results include:  CBC c/w baseline. No leukocytosis or leukopenia.    Cardiac Monitoring:  The patient was maintained on a cardiac monitor.  I personally viewed and interpreted the cardiac monitored which showed an underlying rhythm of: NSR   Medicines ordered and prescription drug management:  I ordered medication including naproxen  for pain  Reevaluation of the patient after these medicines showed that the patient stayed the same I have reviewed the patients home medicines and have made adjustments as needed   Test Considered:  Repeat Xray L foot - felt low yield   Problem List / ED Course:  Frequent ED utilizer presenting for L foot pain. Seen for same x 4 since 04/15/24.  Prior Xray reviewed which was reassuring. No hx of trauma. Neurovascularly intact on exam. Afebrile with reassuring CBC; no elevated WBC count. Mild erythema  noted, but not overtly cellulitis. No frank abscess or drainage. No open wounds. Image taken on 04/16/24 appears similar to exam today. Counseled on NSAID use, outpatient f/u.   Reevaluation:  After the interventions noted above, I reevaluated the patient and found that they have :stayed the same   Social Determinants of Health:  Housing instability   Dispostion:  After consideration of the diagnostic results and the patients response to treatment, I feel that the patent would benefit from outpatient f/u for recheck. Supportive care measures discussed. Patient discharged in stable condition with no unaddressed concerns.       Final diagnoses:  Foot pain, left    ED Discharge Orders  None          Keith Sor, PA-C 04/21/24 1429    Haze Lonni PARAS, MD 04/25/24 [phone]

## 2024-04-18 NOTE — Discharge Instructions (Addendum)
 We recommend over-the-counter analgesics such as Tylenol  or ibuprofen  for management of pain.  Continue your other daily prescribed medications.

## 2024-04-25 ENCOUNTER — Other Ambulatory Visit (HOSPITAL_COMMUNITY): Payer: Self-pay

## 2024-05-03 ENCOUNTER — Encounter (HOSPITAL_COMMUNITY): Payer: Self-pay

## 2024-05-03 ENCOUNTER — Inpatient Hospital Stay (HOSPITAL_COMMUNITY)
Admission: EM | Admit: 2024-05-03 | Discharge: 2024-05-07 | DRG: 974 | Disposition: A | Discharge status: Home / Self Care | Payer: MEDICAID | Attending: Internal Medicine | Admitting: Internal Medicine

## 2024-05-03 ENCOUNTER — Emergency Department (HOSPITAL_COMMUNITY): Payer: MEDICAID

## 2024-05-03 ENCOUNTER — Other Ambulatory Visit: Payer: Self-pay

## 2024-05-03 DIAGNOSIS — R7881 Bacteremia: Secondary | ICD-10-CM | POA: Diagnosis not present

## 2024-05-03 DIAGNOSIS — E871 Hypo-osmolality and hyponatremia: Secondary | ICD-10-CM | POA: Diagnosis not present

## 2024-05-03 DIAGNOSIS — A419 Sepsis, unspecified organism: Secondary | ICD-10-CM | POA: Diagnosis not present

## 2024-05-03 DIAGNOSIS — F1510 Other stimulant abuse, uncomplicated: Secondary | ICD-10-CM | POA: Diagnosis present

## 2024-05-03 DIAGNOSIS — R4182 Altered mental status, unspecified: Secondary | ICD-10-CM

## 2024-05-03 DIAGNOSIS — J189 Pneumonia, unspecified organism: Secondary | ICD-10-CM | POA: Diagnosis not present

## 2024-05-03 DIAGNOSIS — Z91018 Allergy to other foods: Secondary | ICD-10-CM | POA: Diagnosis not present

## 2024-05-03 DIAGNOSIS — G9341 Metabolic encephalopathy: Secondary | ICD-10-CM | POA: Diagnosis present

## 2024-05-03 DIAGNOSIS — J159 Unspecified bacterial pneumonia: Secondary | ICD-10-CM | POA: Diagnosis present

## 2024-05-03 DIAGNOSIS — F1990 Other psychoactive substance use, unspecified, uncomplicated: Secondary | ICD-10-CM | POA: Diagnosis present

## 2024-05-03 DIAGNOSIS — Z79899 Other long term (current) drug therapy: Secondary | ICD-10-CM

## 2024-05-03 DIAGNOSIS — F419 Anxiety disorder, unspecified: Secondary | ICD-10-CM | POA: Diagnosis present

## 2024-05-03 DIAGNOSIS — F17210 Nicotine dependence, cigarettes, uncomplicated: Secondary | ICD-10-CM | POA: Diagnosis present

## 2024-05-03 DIAGNOSIS — F319 Bipolar disorder, unspecified: Secondary | ICD-10-CM | POA: Diagnosis present

## 2024-05-03 DIAGNOSIS — F17200 Nicotine dependence, unspecified, uncomplicated: Secondary | ICD-10-CM | POA: Diagnosis present

## 2024-05-03 DIAGNOSIS — E861 Hypovolemia: Secondary | ICD-10-CM | POA: Diagnosis not present

## 2024-05-03 DIAGNOSIS — Z1152 Encounter for screening for COVID-19: Secondary | ICD-10-CM

## 2024-05-03 DIAGNOSIS — N182 Chronic kidney disease, stage 2 (mild): Secondary | ICD-10-CM | POA: Diagnosis present

## 2024-05-03 DIAGNOSIS — Z5900 Homelessness unspecified: Secondary | ICD-10-CM | POA: Diagnosis not present

## 2024-05-03 DIAGNOSIS — F1410 Cocaine abuse, uncomplicated: Secondary | ICD-10-CM | POA: Diagnosis present

## 2024-05-03 DIAGNOSIS — F1910 Other psychoactive substance abuse, uncomplicated: Secondary | ICD-10-CM | POA: Diagnosis present

## 2024-05-03 DIAGNOSIS — B20 Human immunodeficiency virus [HIV] disease: Principal | ICD-10-CM | POA: Diagnosis present

## 2024-05-03 DIAGNOSIS — R918 Other nonspecific abnormal finding of lung field: Secondary | ICD-10-CM | POA: Diagnosis not present

## 2024-05-03 DIAGNOSIS — Z888 Allergy status to other drugs, medicaments and biological substances status: Secondary | ICD-10-CM | POA: Diagnosis not present

## 2024-05-03 DIAGNOSIS — R6521 Severe sepsis with septic shock: Secondary | ICD-10-CM | POA: Diagnosis present

## 2024-05-03 DIAGNOSIS — N2889 Other specified disorders of kidney and ureter: Secondary | ICD-10-CM | POA: Diagnosis present

## 2024-05-03 DIAGNOSIS — A408 Other streptococcal sepsis: Principal | ICD-10-CM | POA: Diagnosis present

## 2024-05-03 DIAGNOSIS — Z8619 Personal history of other infectious and parasitic diseases: Secondary | ICD-10-CM | POA: Diagnosis not present

## 2024-05-03 LAB — I-STAT CHEM 8, ED
BUN: 9 mg/dL (ref 6–20)
Calcium, Ion: 1.09 mmol/L — ABNORMAL LOW (ref 1.15–1.40)
Chloride: 99 mmol/L (ref 98–111)
Creatinine, Ser: 0.9 mg/dL (ref 0.61–1.24)
Glucose, Bld: 55 mg/dL — ABNORMAL LOW (ref 70–99)
HCT: 45 % (ref 39.0–52.0)
Hemoglobin: 15.3 g/dL (ref 13.0–17.0)
Potassium: 4.5 mmol/L (ref 3.5–5.1)
Sodium: 137 mmol/L (ref 135–145)
TCO2: 27 mmol/L (ref 22–32)

## 2024-05-03 LAB — I-STAT CG4 LACTIC ACID, ED
Lactic Acid, Venous: 6.6 mmol/L (ref 0.5–1.9)
Lactic Acid, Venous: 2.1 mmol/L (ref 0.5–1.9)
Lactic Acid, Venous: 1 mmol/L (ref 0.5–1.9)

## 2024-05-03 LAB — I-STAT VENOUS BLOOD GAS, ED
Acid-base deficit: 1 mmol/L (ref 0.0–2.0)
Bicarbonate: 27 mmol/L (ref 20.0–28.0)
Calcium, Ion: 1.08 mmol/L — ABNORMAL LOW (ref 1.15–1.40)
HCT: 45 % (ref 39.0–52.0)
Hemoglobin: 15.3 g/dL (ref 13.0–17.0)
O2 Saturation: 55 %
Potassium: 4.5 mmol/L (ref 3.5–5.1)
Sodium: 137 mmol/L (ref 135–145)
TCO2: 29 mmol/L (ref 22–32)
pCO2, Ven: 57.3 mmHg (ref 44–60)
pH, Ven: 7.282 (ref 7.25–7.43)
pO2, Ven: 33 mmHg (ref 32–45)

## 2024-05-03 LAB — CBC WITH DIFFERENTIAL/PLATELET
Abs Immature Granulocytes: 0.12 K/uL — ABNORMAL HIGH (ref 0.00–0.07)
Basophils Absolute: 0 K/uL (ref 0.0–0.1)
Basophils Relative: 0 %
Eosinophils Absolute: 0 K/uL (ref 0.0–0.5)
Eosinophils Relative: 0 %
HCT: 43.9 % (ref 39.0–52.0)
Hemoglobin: 13.6 g/dL (ref 13.0–17.0)
Immature Granulocytes: 1 %
Lymphocytes Relative: 16 %
Lymphs Abs: 1.8 K/uL (ref 0.7–4.0)
MCH: 25.8 pg — ABNORMAL LOW (ref 26.0–34.0)
MCHC: 31 g/dL (ref 30.0–36.0)
MCV: 83.3 fL (ref 80.0–100.0)
Monocytes Absolute: 0.9 K/uL (ref 0.1–1.0)
Monocytes Relative: 8 %
Neutro Abs: 8.6 K/uL — ABNORMAL HIGH (ref 1.7–7.7)
Neutrophils Relative %: 75 %
Platelets: 353 K/uL (ref 150–400)
RBC: 5.27 MIL/uL (ref 4.22–5.81)
RDW: 16.3 % — ABNORMAL HIGH (ref 11.5–15.5)
WBC: 11.5 K/uL — ABNORMAL HIGH (ref 4.0–10.5)
nRBC: 0 % (ref 0.0–0.2)

## 2024-05-03 LAB — RAPID URINE DRUG SCREEN, HOSP PERFORMED
Amphetamines: POSITIVE — AB
Barbiturates: NOT DETECTED
Benzodiazepines: NOT DETECTED
Cocaine: NOT DETECTED
Opiates: NOT DETECTED
Tetrahydrocannabinol: NOT DETECTED

## 2024-05-03 LAB — RESP PANEL BY RT-PCR (RSV, FLU A&B, COVID)  RVPGX2
Influenza A by PCR: NEGATIVE
Influenza B by PCR: NEGATIVE
Resp Syncytial Virus by PCR: NEGATIVE
SARS Coronavirus 2 by RT PCR: NEGATIVE

## 2024-05-03 LAB — CBC
HCT: 37.3 % — ABNORMAL LOW (ref 39.0–52.0)
Hemoglobin: 11.7 g/dL — ABNORMAL LOW (ref 13.0–17.0)
MCH: 25.8 pg — ABNORMAL LOW (ref 26.0–34.0)
MCHC: 31.4 g/dL (ref 30.0–36.0)
MCV: 82.2 fL (ref 80.0–100.0)
Platelets: 290 K/uL (ref 150–400)
RBC: 4.54 MIL/uL (ref 4.22–5.81)
RDW: 16.1 % — ABNORMAL HIGH (ref 11.5–15.5)
WBC: 12.6 K/uL — ABNORMAL HIGH (ref 4.0–10.5)
nRBC: 0 % (ref 0.0–0.2)

## 2024-05-03 LAB — COMPREHENSIVE METABOLIC PANEL WITH GFR
ALT: 41 U/L (ref 0–44)
AST: 45 U/L — ABNORMAL HIGH (ref 15–41)
Albumin: 3.3 g/dL — ABNORMAL LOW (ref 3.5–5.0)
Alkaline Phosphatase: 73 U/L (ref 38–126)
Anion gap: 12 (ref 5–15)
BUN: 7 mg/dL (ref 6–20)
CO2: 23 mmol/L (ref 22–32)
Calcium: 8.3 mg/dL — ABNORMAL LOW (ref 8.9–10.3)
Chloride: 100 mmol/L (ref 98–111)
Creatinine, Ser: 0.97 mg/dL (ref 0.61–1.24)
GFR, Estimated: >60 mL/min (ref >60)
Glucose, Bld: 57 mg/dL — ABNORMAL LOW (ref 70–99)
Potassium: 4.4 mmol/L (ref 3.5–5.1)
Sodium: 135 mmol/L (ref 135–145)
Total Bilirubin: 0.6 mg/dL (ref 0.0–1.2)
Total Protein: 9.7 g/dL — ABNORMAL HIGH (ref 6.5–8.1)

## 2024-05-03 LAB — URINALYSIS, ROUTINE W REFLEX MICROSCOPIC
Bilirubin Urine: NEGATIVE
Glucose, UA: NEGATIVE mg/dL
Hgb urine dipstick: NEGATIVE
Ketones, ur: NEGATIVE mg/dL
Leukocytes,Ua: NEGATIVE
Nitrite: NEGATIVE
Protein, ur: NEGATIVE mg/dL
Specific Gravity, Urine: 1.025 (ref 1.005–1.030)
pH: 6 (ref 5.0–8.0)

## 2024-05-03 LAB — PROTIME-INR
INR: 1 (ref 0.8–1.2)
Prothrombin Time: 14.2 s (ref 11.4–15.2)

## 2024-05-03 LAB — CREATININE, SERUM
Creatinine, Ser: 0.84 mg/dL (ref 0.61–1.24)
GFR, Estimated: >60 mL/min (ref >60)

## 2024-05-03 LAB — CBG MONITORING, ED
Glucose-Capillary: 86 mg/dL (ref 70–99)
Glucose-Capillary: 141 mg/dL — ABNORMAL HIGH (ref 70–99)

## 2024-05-03 LAB — ETHANOL: Alcohol, Ethyl (B): <15 mg/dL (ref <15)

## 2024-05-03 MED ORDER — VANCOMYCIN HCL IN DEXTROSE 1-5 GM/200ML-% IV SOLN: 1000.0000 mg | Freq: Once | INTRAVENOUS | Status: DC

## 2024-05-03 MED ORDER — ENOXAPARIN SODIUM 40 MG/0.4ML IJ SOSY
40.0000 mg | PREFILLED_SYRINGE | INTRAMUSCULAR | Status: DC
  Administered 2024-05-04 – 2024-05-06 (×2): 40 mg via SUBCUTANEOUS
  Filled 2024-05-03 (×3): qty 0.4

## 2024-05-03 MED ORDER — IOHEXOL 350 MG/ML SOLN
75.0000 mL | Freq: Once | INTRAVENOUS | Status: AC | PRN
  Administered 2024-05-03: 75 mL via INTRAVENOUS

## 2024-05-03 MED ORDER — SODIUM CHLORIDE 0.9 % IV SOLN: 2.0000 g | Freq: Once | INTRAVENOUS | Status: DC

## 2024-05-03 MED ORDER — KETOROLAC TROMETHAMINE 15 MG/ML IJ SOLN
15.0000 mg | Freq: Once | INTRAMUSCULAR | Status: AC
  Administered 2024-05-03: 15 mg via INTRAVENOUS
  Filled 2024-05-03: qty 1

## 2024-05-03 MED ORDER — ACETAMINOPHEN 325 MG PO TABS
650.0000 mg | ORAL_TABLET | Freq: Four times a day (QID) | ORAL | Status: DC | PRN
  Administered 2024-05-03 – 2024-05-05 (×3): 650 mg via ORAL
  Filled 2024-05-03 (×4): qty 2

## 2024-05-03 MED ORDER — SODIUM CHLORIDE 0.9 % IV BOLUS
1000.0000 mL | Freq: Once | INTRAVENOUS | Status: AC
Start: 2024-05-03 — End: 2024-05-03
  Administered 2024-05-03: 1000 mL via INTRAVENOUS

## 2024-05-03 MED ORDER — SODIUM CHLORIDE 0.9 % IV SOLN
2.0000 g | Freq: Three times a day (TID) | INTRAVENOUS | Status: DC
  Administered 2024-05-03 – 2024-05-05 (×7): 2 g via INTRAVENOUS
  Filled 2024-05-03 (×6): qty 12.5

## 2024-05-03 MED ORDER — LACTATED RINGERS IV BOLUS (SEPSIS)
1000.0000 mL | Freq: Once | INTRAVENOUS | Status: AC
  Administered 2024-05-03: 1000 mL via INTRAVENOUS

## 2024-05-03 MED ORDER — ONDANSETRON HCL 4 MG PO TABS: 4.0000 mg | ORAL_TABLET | Freq: Four times a day (QID) | ORAL | Status: DC | PRN

## 2024-05-03 MED ORDER — LACTATED RINGERS IV SOLN
150.0000 mL/h | INTRAVENOUS | Status: AC
  Administered 2024-05-03 – 2024-05-04 (×2): 150 mL/h via INTRAVENOUS

## 2024-05-03 MED ORDER — ACETAMINOPHEN 500 MG PO TABS
1000.0000 mg | ORAL_TABLET | Freq: Once | ORAL | Status: AC
  Administered 2024-05-03: 1000 mg via ORAL
  Filled 2024-05-03: qty 2

## 2024-05-03 MED ORDER — SODIUM CHLORIDE 0.9 % IV SOLN: 2.0000 g | Freq: Three times a day (TID) | INTRAVENOUS | Status: DC

## 2024-05-03 MED ORDER — VANCOMYCIN HCL 1500 MG/300ML IV SOLN
1500.0000 mg | Freq: Once | INTRAVENOUS | Status: AC
  Administered 2024-05-03: 1500 mg via INTRAVENOUS
  Filled 2024-05-03: qty 300

## 2024-05-03 MED ORDER — LACTATED RINGERS IV SOLN: INTRAVENOUS | Status: AC

## 2024-05-03 MED ORDER — SODIUM CHLORIDE 0.9 % IV SOLN
2.0000 g | Freq: Once | INTRAVENOUS | Status: AC
  Administered 2024-05-03: 2 g via INTRAVENOUS
  Filled 2024-05-03: qty 20

## 2024-05-03 MED ORDER — VANCOMYCIN HCL 1250 MG/250ML IV SOLN
1250.0000 mg | Freq: Two times a day (BID) | INTRAVENOUS | Status: DC
  Administered 2024-05-04 – 2024-05-05 (×3): 1250 mg via INTRAVENOUS
  Filled 2024-05-03 (×4): qty 250

## 2024-05-03 MED ORDER — ONDANSETRON HCL 4 MG/2ML IJ SOLN: 4.0000 mg | Freq: Four times a day (QID) | INTRAMUSCULAR | Status: DC | PRN

## 2024-05-03 MED ORDER — VANCOMYCIN HCL 1250 MG/250ML IV SOLN
1250.0000 mg | Freq: Two times a day (BID) | INTRAVENOUS | Status: DC
  Filled 2024-05-03: qty 250

## 2024-05-03 MED ORDER — DEXTROSE 50 % IV SOLN: 1.0000 | Freq: Once | INTRAVENOUS | Status: DC

## 2024-05-03 MED ORDER — ACETAMINOPHEN 650 MG RE SUPP
650.0000 mg | Freq: Four times a day (QID) | RECTAL | Status: DC | PRN
Start: 2024-05-03 — End: 2024-05-07

## 2024-05-03 MED ORDER — SODIUM CHLORIDE 0.9 % IV SOLN
500.0000 mg | Freq: Once | INTRAVENOUS | Status: AC
  Administered 2024-05-03: 500 mg via INTRAVENOUS
  Filled 2024-05-03: qty 5

## 2024-05-03 MED ORDER — ONDANSETRON HCL 4 MG/2ML IJ SOLN
4.0000 mg | Freq: Once | INTRAMUSCULAR | Status: AC
  Administered 2024-05-03: 4 mg via INTRAVENOUS
  Filled 2024-05-03: qty 2

## 2024-05-03 NOTE — ED Provider Notes (Addendum)
 4:13 PM Patient signed out to me by previous ED physician. Pt is a 33 yo male presenting for left leg pain and AMS.   Sepsis: Concerns for sepsis with wbc 11.5, LA 6.6, temp 102.72F, HR 124, and rr 24.  -pt lethargic but able to arouse. Answers questions appropriately. Moves all four ext. Follows commands. -hx IVDU-crystal meth. Last use reported: 2 weeks prior. Injection site bilateral arms. Injections site non-infectious appearing. Blood cx collected and pending. -prev dog bite approx 3 weeks prior left cath. No erythema, warmth, drainage, or swelling. Poorly approximated by granulation tissue present. See attached photos. Doubt source. Pending imaging to r/o abscess. -CXR demos minimal left infrahilar opacity, compatible with atelectasis versus pneumonia. CT chest pending. -Vomiting in room. Zofran  given. Abdomen soft and minimal diffuse tenderness. CT abd/pelvis pending.    Azithro, rocephin , and vanc started.   P: CT leg results to admit  Physical Exam  BP 116/69   Pulse (!) 124   Temp (!) 102.1 F (38.9 C) (Oral)   Resp (!) 24   SpO2 97%   Physical Exam Vitals and nursing note reviewed.  Constitutional:      General: He is not in acute distress.    Appearance: He is well-developed.  HENT:     Head: Normocephalic and atraumatic.  Eyes:     Conjunctiva/sclera: Conjunctivae normal.  Cardiovascular:     Rate and Rhythm: Regular rhythm. Tachycardia present.     Heart sounds: No murmur heard. Pulmonary:     Effort: Pulmonary effort is normal. No respiratory distress.     Breath sounds: Normal breath sounds.  Abdominal:     Palpations: Abdomen is soft.     Tenderness: There is no abdominal tenderness.  Musculoskeletal:        General: No swelling.     Cervical back: Neck supple.  Skin:    General: Skin is warm and dry.     Capillary Refill: Capillary refill takes less than 2 seconds.      Neurological:     Mental Status: He is lethargic.     GCS: GCS eye subscore is  4. GCS verbal subscore is 5.  Psychiatric:        Mood and Affect: Mood normal.     Procedures  .Critical Care  Performed by: Elnor Bernarda SQUIBB, DO Authorized by: Elnor Bernarda SQUIBB, DO   Critical care provider statement:    Critical care time (minutes):  30   Critical care was necessary to treat or prevent imminent or life-threatening deterioration of the following conditions:  Sepsis   Critical care was time spent personally by me on the following activities:  Development of treatment plan with patient or surrogate, discussions with consultants, evaluation of patient's response to treatment, examination of patient, ordering and review of laboratory studies, ordering and review of radiographic studies, ordering and performing treatments and interventions, pulse oximetry, re-evaluation of patient's condition and review of old charts   Care discussed with: admitting provider     ED Course / MDM   Clinical Course as of 05/03/24 1613  Tue May 03, 2024  1437 WBC(!): 11.5 +leukocytosis w/ left shift [HN]  1445 Lactic Acid, Venous(!!): 6.6 Giving fluids [HN]  1445 pCO2, Ven: 57.3 [HN]  1446 Alcohol, Ethyl (B): <15 [HN]  1446 DG Chest 1 View 1. Minimal left infrahilar opacity, compatible with atelectasis versus pneumonia.   [HN]  1533 Lactic Acid, Venous(!!): 2.1 Improved from prior [HN]  1555 Glucose(!): 57 Initial glucose  on labs but repeat is 84 mg/dL [HN]    Clinical Course User Index [HN] Franklyn Sid SAILOR, MD   Medical Decision Making Amount and/or Complexity of Data Reviewed Labs: ordered. Decision-making details documented in ED Course. Radiology: ordered. Decision-making details documented in ED Course.  Risk OTC drugs. Prescription drug management. Decision regarding hospitalization.   CTA positive for pneumonia only. CT leg demo no infection. CTH neg.  Admit for sepsis with possible pneumonia but r/o bacteremia and endocarditis due to hx of ivdu, lethargy, sepsis...  would be a lot for a small pneumonia. Hospitalist team accepting patient.    Temp improving to 100.7F. HR improving to 101. Tachypnea resolved. Tylenol  already given. Will dose motrin  and repeat ivf. No shock.      Elnor Bernarda SQUIBB, DO 05/03/24 [phone]

## 2024-05-03 NOTE — Progress Notes (Signed)
 Pharmacy Antibiotic Note  Blake Chambers is a 33 y.o. male admitted on 05/03/2024 with pneumonia and sepsis.  Pharmacy has been consulted for vancomycin  and cefepime  dosing.  Plan: Vancomycin  1500mg  IV once, then 1250mg  q12h  Cefepime  2g IV q8h     Temp (24hrs), Avg:100.6 F (38.1 C), Min:98.7 F (37.1 C), Max:102.1 F (38.9 C)  Recent Labs  Lab 05/03/24 1239 05/03/24 1246 05/03/24 1518 05/03/24 1755  WBC 11.5*  --   --   --   CREATININE 0.97 0.90  --   --   LATICACIDVEN  --  6.6* 2.1* 1.0    CrCl cannot be calculated (Unknown ideal weight.).    Allergies  Allergen Reactions   Blueberry Flavoring Agent (Non-Screening) Hives   Pineapple Anaphylaxis   Tenofovir  Disoproxil Other (See Comments)    Intolerance, increase in creatinine   (TRUVADA)    Kiwi Extract Swelling    Antimicrobials this admission: Azithromycin  11/18 x 1 Ceftriaxone  11/18 x 1 Cefepime  11/18 >> Vancomycin  11/18 >>   Microbiology results: 11/18 BCx:  11/18 MRSA PCR:   Thank you for allowing pharmacy to be a part of this patient's care.  Sher Hellinger M Guadalupe Kerekes 05/03/2024 7:05 PM

## 2024-05-03 NOTE — H&P (Signed)
 History and Physical    Patient: Blake Chambers FMW:[id number] DOB: May 28, 1991 DOA: 05/03/2024 DOS: the patient was seen and examined on 05/03/2024 PCP: [facility]  Patient coming from: Home  Chief Complaint:  Chief Complaint  Patient presents with   Arm Pain   Leg Pain   HPI: Blake Chambers is a 33 y.o. male with medical history significant of polysubstance abuse including cocaine abuse, HIV disease, bipolar disorder, history of anxiety disorder, history of syphilis who presented to the ER initially complaining of leg pain and arm pain.  Patient apparently has developed right in his car over 3 days ago.  Was seen initially and evaluated.  Patient is drowsy not able to give any adequate history.  He complains of pain all over responds to voice but falls back to sleep.  Also has been having some cough but no hypoxia no shortness of breath.  In the ER patient meets sepsis criteria with evidence of pneumonia.  He has been admitted with sepsis due to pneumonia.  Review of Systems: As mentioned in the history of present illness. All other systems reviewed and are negative. 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 He has never used smokeless tobacco. He reports that he does not currently use alcohol after a past usage of about 1.0 standard drink of alcohol per week. He reports that he does not currently use drugs after having used the  following drugs: Marijuana, Cocaine, and Methamphetamines. Frequency: 1.00 time per week.  Allergies  Allergen Reactions   Blueberry Flavoring Agent (Non-Screening) Hives   Pineapple Anaphylaxis   Tenofovir  Disoproxil Other (See Comments)    Intolerance, increase in creatinine   (TRUVADA)    Kiwi Extract Swelling    Family History  Family history unknown: Yes    Prior to Admission medications   Medication Sig Start Date End Date Taking? Authorizing Provider  acetaminophen  (TYLENOL ) 500 MG tablet Take 1 tablet (500 mg total) by mouth every 6 (six) hours as needed. 04/15/24   Silver Wonda LABOR, PA  bictegravir-emtricitabine -tenofovir  AF (BIKTARVY ) 50-200-25 MG TABS tablet Take 1 tablet by mouth daily. Patient not taking: Reported on 01/10/2024 11/27/23   Calone, Gregory D, FNP  clotrimazole  (CLOTRIMAZOLE  AF) 1 % cream Apply 1 Application topically 2 (two) times daily. Patient not taking: Reported on 01/10/2024 11/27/23   Calone, Gregory D, FNP  ondansetron  (ZOFRAN -ODT) 8 MG disintegrating tablet Take 1 tablet (8 mg total) by mouth every 8 (eight) hours as needed for nausea Patient not taking: Reported on 01/10/2024 05/23/23   Palumbo, April, MD  pantoprazole  (PROTONIX ) 40 MG tablet Take 1 tablet (40 mg total) by mouth daily. Patient not taking: Reported on 01/10/2024 09/11/23   Haze Lonni PARAS, MD  valACYclovir  (VALTREX ) 1000 MG tablet Take 1 tablet (1,000 mg total) by mouth 2 (two) times daily. Patient not taking: Reported on 01/10/2024 11/27/23   Calone, Gregory  D, FNP    Physical Exam: Vitals:   05/03/24 1200 05/03/24 1539 05/03/24 1645 05/03/24 1830  BP: (!) 130/92 116/69 133/68 110/72  Pulse: (!) 118 (!) 124 (!) 129 (!) 110  Resp: 18 (!) 24 (!) 24 19  Temp: 98.7 F (37.1 C) (!) 102.1 F (38.9 C)  (!) 100.9 F (38.3 C)  TempSrc:  Oral  Oral  SpO2: 92% 97% 100% 98%   Constitutional: Chronically ill looking, drowsy Eyes: PERRL, lids and conjunctivae normal ENMT: Mucous  membranes are moist. Posterior pharynx clear of any exudate or lesions.Normal dentition.  Neck: normal, supple, no masses, no thyromegaly Respiratory: Coarse breath sounds bilaterally, no wheezing, no crackles. Normal respiratory effort. No accessory muscle use.  Cardiovascular: Sinus tachycardia, no murmurs / rubs / gallops. No extremity edema. 2+ pedal pulses. No carotid bruits.  Abdomen: no tenderness, no masses palpated. No hepatosplenomegaly. Bowel sounds positive.  Musculoskeletal: Good range of motion, no joint swelling or tenderness, Skin: no rashes, lesions, ulcers. No induration Neurologic: CN 2-12 grossly intact. Sensation intact, DTR normal. Strength 5/5 in all 4.  Psychiatric: Normal judgment and insight. Alert and oriented x 3. Normal mood  Data Reviewed:  Temperature one 2.1, blood pressure 97/62, pulse 129, respiratory rate of 24 on the rate control 0.6, hemoglobin 11.7 calcium 8.3.  Initial glucose 55 but 141.  Urinalysis essentially negative.  Urine drug screen is positive for amphetamines.  CT chest abdomen pelvis showed interval development of left lower lobe peribronchial consolidative airspace disease in the lingula.  Head CT without contrast showed no acute findings CT tibia-fibula showed punctate radiopaque density along the anterior proximal tibia shaft probably retained foreign body no acute fractures or osteo lactic acid 6.6 but improved to  Assessment and Plan:  #1 severe sepsis due to pneumonia: Patient meets sepsis criteria with temperature, white count, tachycardia as well as hypotension.  Appears to also have pneumonia on CT scan.  We will admit to monitored bed.  Initiate sepsis protocol.  Monitor white count.  Blood cultures obtained and will follow.  Patient has history of IV drug abuse and: Her bacteremia as well.  Follow lactate level with aggressive hydration.  #2 polysubstance abuse: Drug screen is positive for amphetamines.  Blood cultures obtained to rule  out bacteremia.  If confirmed may need TTE and possible TEE  #3 acute metabolic encephalopathy: May be multifactorial.  Continue to monitor.  #4 HIV infection: CD4 count 525 in June of this year.  HIV-1 RNA quantitative was 21,000 in July of this year.  Not known if patient has been compliant  #5 bipolar disorder: Will continue home regimen  #6 tobacco abuse: Counseling will be provided.    Advance Care Planning:   Code Status: Full Code   Consults: None  Family Communication: No family at bedside  Severity of Illness: The appropriate patient status for this patient is INPATIENT. Inpatient status is judged to be reasonable and necessary in order to provide the required intensity of service to ensure the patient's safety. The patient's presenting symptoms, physical exam findings, and initial radiographic and laboratory data in the context of their chronic comorbidities is felt to place them at high risk for further clinical deterioration. Furthermore, it is not anticipated that the patient will be medically stable for discharge from the hospital within 2 midnights of admission.   * I certify that at the point of admission it is my clinical judgment that the patient will require inpatient hospital care spanning beyond 2  midnights from the point of admission due to high intensity of service, high risk for further deterioration and high frequency of surveillance required.*  AuthorBETHA SIM KNOLL, MD 05/03/2024 6:58 PM  For on call review [email or web address].

## 2024-05-03 NOTE — ED Provider Notes (Signed)
 [place] EMERGENCY DEPARTMENT AT [facility] Provider Note   CSN: [id number] Arrival date & time: 05/03/24  1155     History  Chief Complaint  Patient presents with   Arm Pain   Leg Pain    Blake Chambers is a 33 y.o. male with PMH as listed below who presents with leg pain, AMS. Patient is sleepy on initial examination, though he is arousable to voice and protecting his airway. He is oriented and states he is hurting all over. When asked directly if he has been coughing he states yes. He also recently sustained a dog bite to left lower extremity which is tender to touch a little bi,t he states. which doesn't improve on reevaluation. .    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 Authorizing Provider  acetaminophen  (TYLENOL ) 500 MG tablet Take 1 tablet (500 mg total) by mouth every 6 (six) hours as needed. 04/15/24   Silver Wonda LABOR, PA  bictegravir-emtricitabine -tenofovir  AF (BIKTARVY ) 50-200-25 MG TABS tablet Take 1 tablet by mouth daily. Patient not taking: Reported on 01/10/2024 11/27/23   Calone, Gregory D, FNP  clotrimazole  (CLOTRIMAZOLE  AF) 1 % cream Apply 1 Application topically 2 (two) times daily. Patient not taking: Reported on 01/10/2024 11/27/23   Calone, Gregory D, FNP  ondansetron  (ZOFRAN -ODT) 8 MG disintegrating tablet Take 1 tablet (8 mg total) by mouth every 8 (eight) hours as needed for nausea Patient not taking: Reported on 01/10/2024 05/23/23   Palumbo, April, MD  pantoprazole  (PROTONIX ) 40 MG tablet Take 1 tablet (40 mg total) by mouth daily. Patient not taking: Reported on 01/10/2024 09/11/23   Haze Lonni PARAS, MD   valACYclovir  (VALTREX ) 1000 MG tablet Take 1 tablet (1,000 mg total) by mouth 2 (two) times daily. Patient not taking: Reported on 01/10/2024 11/27/23   Calone, Gregory D, FNP      Allergies    Blueberry flavoring agent (non-screening), Pineapple, Tenofovir  disoproxil, and Kiwi extract    Review of Systems   Review of Systems A 10 point review of systems was performed and is negative unless otherwise reported in HPI.  Physical Exam Updated Vital Signs BP 116/69   Pulse (!) 124   Temp (!) 102.1 F (38.9 C) (Oral)   Resp (!) 24   SpO2 97%  Physical Exam General: Sleepy- appearing male, sitting in wheelchair. HEENT: PERRLA, EOMI, Sclera anicteric, MMM, trachea midline.  Cardiology: Regular tachycardic rate, no murmurs/rubs/gallops.  Resp: Normal respiratory rate and effort. CTAB, no wheezes, rhonchi, crackles.  Abd: Soft, non-tender, non-distended. No rebound tenderness or guarding.  GU: Deferred. MSK: Left lower extremity calf region w/ 3 cm open wound, scabbed, no significant erythema, induration, purulent drainage, or fluctuance. Draining minimal serous fluid. Mild TTP, no crepitus palpated. No peripheral edema or signs of trauma.  Skin: warm, dry.  Back: No CVA tenderness Neuro: Sleepy but oriented x4, CNs II-XII grossly intact. MAEs. Sensation grossly intact.   ED Results / Procedures / Treatments   Labs (all labs ordered are listed, but only abnormal results are displayed) Labs Reviewed  CBC WITH DIFFERENTIAL/PLATELET - Abnormal; Notable  for the following components:      Result Value   WBC 11.5 (*)    MCH 25.8 (*)    RDW 16.3 (*)    Neutro Abs 8.6 (*)    Abs Immature Granulocytes 0.12 (*)    All other components within normal limits  COMPREHENSIVE METABOLIC PANEL WITH GFR - Abnormal; Notable for the following components:   Glucose, Bld 57 (*)    Calcium 8.3 (*)    Total Protein 9.7 (*)    Albumin 3.3 (*)    AST 45 (*)    All other components within normal limits   I-STAT VENOUS BLOOD GAS, ED - Abnormal; Notable for the following components:   Calcium, Ion 1.08 (*)    All other components within normal limits  I-STAT CHEM 8, ED - Abnormal; Notable for the following components:   Glucose, Bld 55 (*)    Calcium, Ion 1.09 (*)    All other components within normal limits  I-STAT CG4 LACTIC ACID, ED - Abnormal; Notable for the following components:   Lactic Acid, Venous 6.6 (*)    All other components within normal limits  I-STAT CG4 LACTIC ACID, ED - Abnormal; Notable for the following components:   Lactic Acid, Venous 2.1 (*)    All other components within normal limits  RESP PANEL BY RT-PCR (RSV, FLU A&B, COVID)  RVPGX2  CULTURE, BLOOD (ROUTINE X 2)  CULTURE, BLOOD (ROUTINE X 2)  ETHANOL  PROTIME-INR  RAPID URINE DRUG SCREEN, HOSP PERFORMED  URINALYSIS, ROUTINE W REFLEX MICROSCOPIC  URINALYSIS, W/ REFLEX TO CULTURE (INFECTION SUSPECTED)  I-STAT CG4 LACTIC ACID, ED  CBG MONITORING, ED    EKG None  Radiology DG Chest 1 View Result Date: 05/03/2024 EXAM: 1 VIEW(S) XRAY OF THE CHEST 05/03/2024 12:52:46 PM COMPARISON: 09/11/2023 CLINICAL HISTORY: 144615 Pain 144615 Pain FINDINGS: LUNGS AND PLEURA: Minimal left infrahilar opacity is noted concerning for atelectasis or possible pneumonia. No pleural effusion. No pneumothorax. HEART AND MEDIASTINUM: No acute abnormality of the cardiac and mediastinal silhouettes. BONES AND SOFT TISSUES: No acute osseous abnormality. IMPRESSION: 1. Minimal left infrahilar opacity, compatible with atelectasis versus pneumonia. Electronically signed by: Lynwood Seip MD 05/03/2024 01:20 PM EST RP Workstation: [id number]    Procedures .Critical Care  Performed by: Franklyn Sid SAILOR, MD Authorized by: Franklyn Sid SAILOR, MD   Critical care provider statement:    Critical care time (minutes):  30   Critical care was necessary to treat or prevent imminent or life-threatening deterioration of the following conditions:  Sepsis  and CNS failure or compromise   Critical care was time spent personally by me on the following activities:  Development of treatment plan with patient or surrogate, discussions with consultants, evaluation of patient's response to treatment, examination of patient, ordering and review of laboratory studies, ordering and review of radiographic studies, ordering and performing treatments and interventions, pulse oximetry, re-evaluation of patient's condition, review of old charts and obtaining history from patient or surrogate     Medications Ordered in ED Medications  lactated ringers  infusion (has no administration in time range)  lactated ringers  bolus 1,000 mL (has no administration in time range)  cefTRIAXone  (ROCEPHIN ) 2 g in sodium chloride  0.9 % 100 mL IVPB (has no administration in time range)  vancomycin  (VANCOREADY) IVPB 1500 mg/300 mL (has no administration in time range)  acetaminophen  (TYLENOL ) tablet 1,000 mg (has no administration in time range)  azithromycin  (ZITHROMAX ) 500 mg in sodium chloride  0.9 % 250 mL IVPB (has  no administration in time range)    ED Course/ Medical Decision Making/ A&P                          Medical Decision Making Amount and/or Complexity of Data Reviewed Labs: ordered. Decision-making details documented in ED Course. Radiology: ordered. Decision-making details documented in ED Course.  Risk OTC drugs. Prescription drug management. Decision regarding hospitalization.    This patient presents to the ED for concern of all over pain., this involves an extensive number of treatment options, and is a complaint that carries with it a high risk of complications and morbidity.  I considered the following differential and admission for this acute, potentially life threatening condition.   MDM:    Ddx of acute altered mental status or encephalopathy considered but not limited to: -Intracranial abnormalities such as ICH, hydrocephalus, head trauma -  no e/o head trauma on exam -Infection such as UTI, PNA, or cellulitis -Toxic ingestion such as illicit substances - does have h/o substance abuse, UDS is pending and EtOH is neg -No significant electrolyte abnormalities or hyper/hypoglycemia -No hypercarbia or hypoxia  I never received calls or notification from lab on critical values for this patient, including lactate 6.6 and glucose 57. When I saw these values while patient was in waiting room, I immediately notified charge nurse and we pulled patient back to a treatment room from the waiting room. Patient now appears toxic. Patient found to be febrile on repeat evaluation to 102.63F and tachypneic. Code sepsis called. Ordered blood cultures, IVF, IV abx including vancomycin , azithromycin , and ceftriaxone . Patient has open wound draining serous fluid from posterior left lower leg,with some TTP, but no crepitus palpated, no significant erythema/induration/fluctuance or purulent drainage from this wound. He has possible PNA on CXR, which could be another source of his sepsis. However patient is altered and cannot give great history at this time. Will CTH to r/o ICH or hydrocephalus and will CT left lower leg to r/o necrotizing infection. No abd TTP to indicate intraabdominal infection. Urine still pending as well.    Clinical Course as of 05/03/24 1555  Tue May 03, 2024  1437 WBC(!): 11.5 +leukocytosis w/ left shift [HN]  1445 Lactic Acid, Venous(!!): 6.6 Giving fluids [HN]  1445 pCO2, Ven: 57.3 [HN]  1446 Alcohol, Ethyl (B): <15 [HN]  1446 DG Chest 1 View 1. Minimal left infrahilar opacity, compatible with atelectasis versus pneumonia.   [HN]  1533 Lactic Acid, Venous(!!): 2.1 Improved from prior [HN]  1555 Glucose(!): 57 Initial glucose on labs but repeat is 84 mg/dL [HN]    Clinical Course User Index [HN] Franklyn Sid SAILOR, MD    Labs: I Ordered, and personally interpreted labs.  The pertinent results include: Those listed  above  Imaging Studies ordered: I ordered imaging studies including CT head, CT lower extremity I independently visualized and interpreted imaging. I agree with the radiologist interpretation  Additional history obtained from chart review.    Cardiac Monitoring: The patient was maintained on a cardiac monitor.  I personally viewed and interpreted the cardiac monitored which showed an underlying rhythm of: Sinus tachycardia  Reevaluation: After the interventions noted above, I reevaluated the patient and found that they have :worsened  Social Determinants of Health:  housing unstable  Disposition: Signed out to oncoming provider Dr. Elnor who is made aware of his presentation history and workup.  Plan for admission after CT scans.  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)    Syphilis      Medicines Meds ordered this encounter  Medications   lactated ringers  infusion   lactated ringers  bolus 1,000 mL    Reason 30 mL/kg dose is not being ordered:   Non-Septic Shock Clinical Presentation   cefTRIAXone  (ROCEPHIN ) 2 g in sodium chloride  0.9 % 100 mL IVPB    Antibiotic Indication::   Cellulitis   DISCONTD: vancomycin  (VANCOCIN ) IVPB 1000 mg/200 mL premix    Indication::   Cellulitis   vancomycin  (VANCOREADY) IVPB 1500 mg/300 mL    Indication::   Sepsis   acetaminophen  (TYLENOL ) tablet 1,000 mg   azithromycin  (ZITHROMAX ) 500 mg in sodium chloride  0.9 % 250 mL IVPB   DISCONTD: dextrose  50 % solution 50 mL    I have reviewed the patients home medicines and have made adjustments as needed  Problem List / ED Course: Problem List Items Addressed This Visit   None               This note was created using  dictation software, which may contain spelling or grammatical errors.    Franklyn Sid SAILOR, MD 05/03/24 [phone]

## 2024-05-03 NOTE — ED Triage Notes (Signed)
 Pt c.o bilateral arm and leg pain. Pt also has a dog bite on his left calf from 3 days ago, warm to touch

## 2024-05-03 NOTE — ED Notes (Signed)
 Zithromax  delayed as vanc is still running. Can only run one antibiotic at a time.

## 2024-05-03 NOTE — ED Provider Triage Note (Signed)
 Emergency Medicine Provider Triage Evaluation Note  Blake Chambers , a 33 y.o. male  was evaluated in triage. H/o HIV (11/27/23 CD4 >500), bipolar affective disorder, homelessness  Pt complains of pain everywhere. He refuses to answer most questions and is sleepy on exam but arousable to voice. When asked why he is sleepy he states, haven't slept. Unable to get further history.   Review of Systems  Positive: Fatigue/sleepiness, pain all over Negative: Refuses to answer more questions  Physical Exam  BP (!) 130/92 (BP Location: Right Arm)   Pulse (!) 118   Temp 98.7 F (37.1 C)   Resp 18   SpO2 92%  Gen:   Sleepy, no distress  Resp:  Normal effort  Cardiac: Regular tachycardic rate MSK:   Moves extremities without difficulty  Other:    Medical Decision Making  Medically screening exam initiated at 12:02 PM.  Appropriate orders placed.  Blake Chambers was informed that the remainder of the evaluation will be completed by another provider, this initial triage assessment does not replace that evaluation, and the importance of remaining in the ED until their evaluation is complete.  Patient is normotensive, afebrile. He is mildly tachycardic. Will obtain labs and move patient to treatment space when one becomes available.   Franklyn Sid SAILOR, MD 05/03/24 [phone]

## 2024-05-03 NOTE — Progress Notes (Signed)
 Elink is following code sepsis.

## 2024-05-04 DIAGNOSIS — A419 Sepsis, unspecified organism: Secondary | ICD-10-CM | POA: Diagnosis not present

## 2024-05-04 DIAGNOSIS — J189 Pneumonia, unspecified organism: Secondary | ICD-10-CM | POA: Diagnosis not present

## 2024-05-04 LAB — BLOOD CULTURE ID PANEL (REFLEXED) - BCID2

## 2024-05-04 LAB — COMPREHENSIVE METABOLIC PANEL WITH GFR
ALT: 27 U/L (ref 0–44)
AST: 29 U/L (ref 15–41)
Albumin: 2.3 g/dL — ABNORMAL LOW (ref 3.5–5.0)
Alkaline Phosphatase: 55 U/L (ref 38–126)
Anion gap: 10 (ref 5–15)
BUN: 9 mg/dL (ref 6–20)
CO2: 23 mmol/L (ref 22–32)
Calcium: 7.9 mg/dL — ABNORMAL LOW (ref 8.9–10.3)
Chloride: 102 mmol/L (ref 98–111)
Creatinine, Ser: 1.07 mg/dL (ref 0.61–1.24)
GFR, Estimated: >60 mL/min (ref >60)
Glucose, Bld: 100 mg/dL — ABNORMAL HIGH (ref 70–99)
Potassium: 3.5 mmol/L (ref 3.5–5.1)
Sodium: 135 mmol/L (ref 135–145)
Total Bilirubin: 0.8 mg/dL (ref 0.0–1.2)
Total Protein: 7.1 g/dL (ref 6.5–8.1)

## 2024-05-04 LAB — CBC
HCT: 38.9 % — ABNORMAL LOW (ref 39.0–52.0)
Hemoglobin: 12.3 g/dL — ABNORMAL LOW (ref 13.0–17.0)
MCH: 25.7 pg — ABNORMAL LOW (ref 26.0–34.0)
MCHC: 31.6 g/dL (ref 30.0–36.0)
MCV: 81.4 fL (ref 80.0–100.0)
Platelets: 293 K/uL (ref 150–400)
RBC: 4.78 MIL/uL (ref 4.22–5.81)
RDW: 16.1 % — ABNORMAL HIGH (ref 11.5–15.5)
WBC: 12.2 K/uL — ABNORMAL HIGH (ref 4.0–10.5)
nRBC: 0 % (ref 0.0–0.2)

## 2024-05-04 LAB — CORTISOL-AM, BLOOD: Cortisol - AM: 19.8 ug/dL (ref 6.7–22.6)

## 2024-05-04 LAB — PROTIME-INR
INR: 1.1 (ref 0.8–1.2)
Prothrombin Time: 15 s (ref 11.4–15.2)

## 2024-05-04 MED ORDER — LACTATED RINGERS IV SOLN
150.0000 mL/h | INTRAVENOUS | Status: AC
  Administered 2024-05-04 – 2024-05-05 (×3): 150 mL/h via INTRAVENOUS

## 2024-05-04 NOTE — Progress Notes (Addendum)
  Progress Note   Patient: Blake Chambers FMW:[id number] DOB: Mar 20, 1991 DOA: 05/03/2024     1 DOS: the patient was seen and examined on 05/04/2024   Brief hospital course: 33 y.o. male with medical history significant of polysubstance abuse including cocaine abuse, HIV disease, bipolar disorder, history of anxiety disorder, history of syphilis who presented to the ER initially complaining of leg pain and arm pain.  In the ER patient meets sepsis criteria with evidence of pneumonia.  He has been admitted with sepsis due to pneumonia.   Assessment and Plan:  Severe sepsis due to possible bacterial community acquired left lower lobe pneumonia:  Continue with intravenous fluids as well as antibiotics including Vanco and cefepime .  Follow blood cultures and adjust ventriculitis accordingly.   Polysubstance abuse: Drug screen is positive for amphetamines.  Blood cultures obtained to rule out bacteremia.  If confirmed may need TTE and possible TEE   Acute metabolic encephalopathy: Likely multifactorial in the setting of polysubstance abuse as well as sepsis.  Continue to monitor. I personally viewed the CT of the head which was done in the emergency room and it did not show any acute intracranial findings.   HIV infection: CD4 count 525 in June of this year.  HIV-1 RNA quantitative was 21,000 in July of this year.  Not known if patient has been compliant   Bipolar disorder: Will continue home regimen   Tobacco abuse: Counseled extensively regarding tobacco cessation.  Disposition: Home        Subjective: Appears sleepy. Denied chest pain or shortness of breath.  Physical Exam: Vitals:   05/04/24 0526 05/04/24 0530 05/04/24 0800 05/04/24 0930  BP:  135/85 119/84 119/87  Pulse:  94 94 (!) 110  Resp:  17 (!) 39   Temp: 98.2 F (36.8 C)   98.8 F (37.1 C)  TempSrc: Oral   Oral  SpO2:  97% 98% 99%   Constitutional: appears sleepy Eyes: PERRL, lids and conjunctivae normal ENMT:  Mucous membranes are moist. Posterior pharynx clear of any exudate or lesions.Normal dentition.  Neck: normal, supple, no masses, no thyromegaly Respiratory: clear to auscultation bilaterally, no wheezing, no crackles. Normal respiratory effort. No accessory muscle use.  Cardiovascular: Regular rate and rhythm, no murmurs / rubs / gallops. No extremity edema. 2+ pedal pulses. No carotid bruits.  Abdomen: no tenderness, no masses palpated. No hepatosplenomegaly. Bowel sounds positive.  Musculoskeletal: no clubbing / cyanosis. No joint deformity upper and lower extremities. Good ROM, no contractures. Normal muscle tone.  Skin: no rashes, lesions, ulcers. No induration Neurologic: CN 2-12 grossly intact. Sensation intact, DTR normal. Strength 5/5 x all 4 extremities.   Data Reviewed:  There are no new results to review at this time.  Family Communication: None at the bedside  Disposition: Status is: Inpatient Remains inpatient appropriate because: Sepsis, pneumonia  Planned Discharge Destination: Home    Time spent: 39 minutes  Author: Deliliah Room, MD 05/04/2024 10:13 AM  For on call review [email or web address].

## 2024-05-04 NOTE — Plan of Care (Signed)

## 2024-05-04 NOTE — Plan of Care (Signed)
  Problem: Clinical Measurements: Goal: Diagnostic test results will improve Outcome: Completed/Met

## 2024-05-04 NOTE — Progress Notes (Signed)
 PHARMACY - PHYSICIAN COMMUNICATION CRITICAL VALUE ALERT - BLOOD CULTURE IDENTIFICATION (BCID)  Blake Chambers is an 33 y.o. male who presented to [facility] on 05/03/2024 with a chief complaint of arm and leg pain, recent dog bite to left calf  Assessment:  69 year old man with a history of IV drug use presents to the ED with pain and fever.  Blood cultures positive with strep species in one bottle so far.  Unsure if this represents true infection or contamination  Name of physician (or Provider) Contacted: Dr. Dino  Current antibiotics: Vancomycin  and cefepime   Changes to prescribed antibiotics recommended: None Patient is on recommended antibiotics - No changes needed  Results for orders placed or performed during the hospital encounter of 01/09/24  Blood Culture ID Panel (Reflexed) (Collected: 01/09/2024 11:25 PM)  Result Value Ref Range   Enterococcus faecalis NOT DETECTED NOT DETECTED   Enterococcus Faecium NOT DETECTED NOT DETECTED   Listeria monocytogenes NOT DETECTED NOT DETECTED   Staphylococcus species DETECTED (A) NOT DETECTED   Staphylococcus aureus (BCID) NOT DETECTED NOT DETECTED   Staphylococcus epidermidis NOT DETECTED NOT DETECTED   Staphylococcus lugdunensis NOT DETECTED NOT DETECTED   Streptococcus species NOT DETECTED NOT DETECTED   Streptococcus agalactiae NOT DETECTED NOT DETECTED   Streptococcus pneumoniae NOT DETECTED NOT DETECTED   Streptococcus pyogenes NOT DETECTED NOT DETECTED   A.calcoaceticus-baumannii NOT DETECTED NOT DETECTED   Bacteroides fragilis NOT DETECTED NOT DETECTED   Enterobacterales NOT DETECTED NOT DETECTED   Enterobacter cloacae complex NOT DETECTED NOT DETECTED   Escherichia coli NOT DETECTED NOT DETECTED   Klebsiella aerogenes NOT DETECTED NOT DETECTED   Klebsiella oxytoca NOT DETECTED NOT DETECTED   Klebsiella pneumoniae NOT DETECTED NOT DETECTED   Proteus species NOT DETECTED NOT DETECTED   Salmonella species NOT DETECTED NOT  DETECTED   Serratia marcescens NOT DETECTED NOT DETECTED   Haemophilus influenzae NOT DETECTED NOT DETECTED   Neisseria meningitidis NOT DETECTED NOT DETECTED   Pseudomonas aeruginosa NOT DETECTED NOT DETECTED   Stenotrophomonas maltophilia NOT DETECTED NOT DETECTED   Candida albicans NOT DETECTED NOT DETECTED   Candida auris NOT DETECTED NOT DETECTED   Candida glabrata NOT DETECTED NOT DETECTED   Candida krusei NOT DETECTED NOT DETECTED   Candida parapsilosis NOT DETECTED NOT DETECTED   Candida tropicalis NOT DETECTED NOT DETECTED   Cryptococcus neoformans/gattii NOT DETECTED NOT DETECTED    [name] 05/04/2024  8:31 AM

## 2024-05-05 ENCOUNTER — Inpatient Hospital Stay (HOSPITAL_COMMUNITY): Payer: MEDICAID

## 2024-05-05 DIAGNOSIS — Z8619 Personal history of other infectious and parasitic diseases: Secondary | ICD-10-CM

## 2024-05-05 DIAGNOSIS — R7881 Bacteremia: Secondary | ICD-10-CM | POA: Diagnosis not present

## 2024-05-05 DIAGNOSIS — R918 Other nonspecific abnormal finding of lung field: Secondary | ICD-10-CM | POA: Diagnosis not present

## 2024-05-05 DIAGNOSIS — A419 Sepsis, unspecified organism: Secondary | ICD-10-CM | POA: Diagnosis not present

## 2024-05-05 DIAGNOSIS — B20 Human immunodeficiency virus [HIV] disease: Secondary | ICD-10-CM | POA: Diagnosis not present

## 2024-05-05 DIAGNOSIS — J189 Pneumonia, unspecified organism: Secondary | ICD-10-CM | POA: Diagnosis not present

## 2024-05-05 LAB — BASIC METABOLIC PANEL WITH GFR
Anion gap: 10 (ref 5–15)
BUN: 9 mg/dL (ref 6–20)
CO2: 22 mmol/L (ref 22–32)
Calcium: 8.2 mg/dL — ABNORMAL LOW (ref 8.9–10.3)
Chloride: 99 mmol/L (ref 98–111)
Creatinine, Ser: 0.72 mg/dL (ref 0.61–1.24)
GFR, Estimated: >60 mL/min (ref >60)
Glucose, Bld: 102 mg/dL — ABNORMAL HIGH (ref 70–99)
Potassium: 3.7 mmol/L (ref 3.5–5.1)
Sodium: 131 mmol/L — ABNORMAL LOW (ref 135–145)

## 2024-05-05 LAB — MRSA CULTURE: Culture: NOT DETECTED

## 2024-05-05 LAB — ECHOCARDIOGRAM COMPLETE
Area-P 1/2: 3.21 cm2
S' Lateral: 3.2 cm

## 2024-05-05 MED ORDER — ORAL CARE MOUTH RINSE: 15.0000 mL | OROMUCOSAL | Status: DC | PRN

## 2024-05-05 MED ORDER — SODIUM CHLORIDE 0.9 % IV SOLN: INTRAVENOUS | Status: AC

## 2024-05-05 MED ORDER — SODIUM CHLORIDE 0.9 % IV SOLN
2.0000 g | INTRAVENOUS | Status: DC
  Administered 2024-05-05 – 2024-05-06 (×2): 2 g via INTRAVENOUS
  Filled 2024-05-05 (×2): qty 20

## 2024-05-05 MED ORDER — NICOTINE 21 MG/24HR TD PT24
21.0000 mg | MEDICATED_PATCH | Freq: Every day | TRANSDERMAL | Status: DC
  Filled 2024-05-05 (×2): qty 1

## 2024-05-05 NOTE — Progress Notes (Signed)
 Echocardiogram 2D Echocardiogram has been performed.  Blake Chambers 05/05/2024, 4:01 PM

## 2024-05-05 NOTE — Plan of Care (Signed)
  Problem: Fluid Volume: Goal: Hemodynamic stability will improve Outcome: Progressing   Problem: Clinical Measurements: Goal: Signs and symptoms of infection will decrease Outcome: Progressing   Problem: Respiratory: Goal: Ability to maintain adequate ventilation will improve Outcome: Progressing   Problem: Education: Goal: Knowledge of General Education information will improve Description: Including pain rating scale, medication(s)/side effects and non-pharmacologic comfort measures Outcome: Progressing   Problem: Health Behavior/Discharge Planning: Goal: Ability to manage health-related needs will improve Outcome: Progressing   Problem: Clinical Measurements: Goal: Ability to maintain clinical measurements within normal limits will improve Outcome: Progressing Goal: Will remain free from infection Outcome: Progressing Goal: Diagnostic test results will improve Outcome: Progressing Goal: Respiratory complications will improve Outcome: Progressing Goal: Cardiovascular complication will be avoided Outcome: Progressing   Problem: Activity: Goal: Risk for activity intolerance will decrease Outcome: Progressing   Problem: Nutrition: Goal: Adequate nutrition will be maintained Outcome: Progressing   Problem: Coping: Goal: Level of anxiety will decrease Outcome: Progressing   Problem: Elimination: Goal: Will not experience complications related to bowel motility Outcome: Progressing Goal: Will not experience complications related to urinary retention Outcome: Progressing   Problem: Pain Managment: Goal: General experience of comfort will improve and/or be controlled Outcome: Progressing   Problem: Safety: Goal: Ability to remain free from injury will improve Outcome: Progressing   Problem: Skin Integrity: Goal: Risk for impaired skin integrity will decrease Outcome: Progressing

## 2024-05-05 NOTE — Progress Notes (Signed)
   [facility] has been requested to perform a transesophageal echocardiogram on Blake Chambers for bacteremia.     The patient does NOT have any absolute or relative contraindications to a Transesophageal Echocardiogram (TEE).  The patient has: No other conditions that may impact this procedure.  Transthoracic echocardiogram has not been completed at the time of this consent. May need to reevaluate post-TTE pending any severe valvular dysfunction or reduced EF.  After careful review of history and examination, the risks and benefits of transesophageal echocardiogram have been explained including risks of esophageal damage, perforation (1:10,000 risk), bleeding, pharyngeal hematoma as well as other potential complications associated with conscious sedation including aspiration, arrhythmia, respiratory failure and death. Alternatives to treatment were discussed, questions were answered. Patient is willing to proceed.   Patient is very tired when I enter the room. He is able to awake and listen to me when I discuss the procedure with him. He does not have any family members or emergency contacts in his chart and tells me there is no one else to be able to discus this procedure with. He tells me he fully understands, he's just tired   Signed, Waddell DELENA Donath, PA-C  05/05/2024 3:05 PM

## 2024-05-05 NOTE — Consult Note (Signed)
 [facility]    Date of Admission:  05/03/2024     Reason for Consult: sepsis/bacteremia    Referring Provider: Dino Antu     Abx: 11/18-c Vanc/cefepime         Assessment: 33 yo male with hiv, bipolar, anxiety, hx syphilis, ivdu admitted with sepsis and bcx strep mitis  #bacteremia #sepis 11/18 bcx 1 of 2 sets strep mitis  He complains of leg/arm pain reason for admission and was somnolent on presentation. Ct head negative. Left lower ext ct unremarkable for infectious cause  Exam without concerning finding on extremities  Ct chest does suggest pulm opacity  With the hx ivdu, any bacteremia even 1 of 2 sets are concerning, in setting sepsis, for ie. Reasonable to proceed with r/o  #lll opacity ?aspiration pna vs septic emboli  W/u as above On abx  #Hiv He follows at [facility]; lov 11/2023 Risk msm No hx OI Hx noncomliance ART experienced but maintained on biktarvy  Lab Results  Component Value Date   CD4TCELL 28 (L) 01/10/2024   CD4TABS 551 01/10/2024   Lab Results  Component Value Date   HIV1RNAQUANT 21,000 01/10/2024    Will need to recheck viral load/cd4 Can probably restart biktarvy  although ideally would like to see he shows up for clinic f/u for this   #hx syphilis Recheck rpr titer  08/2020 hep b sAb positive Hep c serology for health maintenance 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 male with hiv, bipolar, anxiety, hx syphilis, ivdu admitted with sepsis and bcx strep mitis  Patient somnolent/sleepy hx via chart  Reported coming in with leg/arm pain and left lower ext ct unremarkable Bcx strep mitis 1 of 2 set Uds positive for amphetamine Chest ct suggest lll opacity On bsAbx Fever had defervesced Initial wbc 12 also normalized   Family History  Family history unknown: Yes    Social History   Tobacco Use   Smoking status: Some Days    Current packs/day: 0.30    Types: Cigarettes   Smokeless tobacco: Never   Tobacco comments:    pt. trying to quit  Vaping Use   Vaping status: Never Used  Substance Use Topics   Alcohol use: Not Currently    Alcohol/week: 1.0 standard drink of alcohol    Types: 1 Standard drinks or equivalent per week   Drug use: Not Currently    Frequency: 1.0 times per week    Types: Marijuana, Cocaine, Methamphetamines    Comment: pt reports not using any substance for the past month    Allergies  Allergen Reactions   Blueberry Flavoring Agent (Non-Screening) Hives   Pineapple Anaphylaxis   Tenofovir  Disoproxil Other (See Comments)    Intolerance, increase in creatinine   (TRUVADA)    Kiwi Extract Swelling    Review of Systems: ROS All Other ROS was negative, except mentioned above   Past Medical  History:  Diagnosis Date   Anxiety    Bipolar affective (HCC)    HIV (human immunodeficiency virus infection) (HCC)    Immune deficiency disorder    Malingering    Mental health disorder    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 rate 18, SpO2 95%.  Physical Exam General/constitutional: sleeping; echo being done; not arousable HEENT: Normocephalic, PER, Conj Clear Neck supple CV: rrr no mrg Lungs: clear to auscultation, normal respiratory effort Abd: Soft, Nontender Ext: no edema Skin: No Rash Neuro: nonfocal MSK: no peripheral joint swelling/tenderness/warmth; back spines nontender     Lab Results Lab Results  Component Value Date   WBC 12.2 (H) 05/04/2024   HGB 12.3 (L) 05/04/2024   HCT 38.9 (L) 05/04/2024   MCV 81.4 05/04/2024   PLT 293 05/04/2024    Lab Results  Component Value Date   CREATININE 0.72 05/05/2024   BUN 9 05/05/2024   NA 131 (L) 05/05/2024   K 3.7 05/05/2024   CL 99 05/05/2024   CO2 22 05/05/2024    Lab Results  Component Value Date   ALT 27 05/04/2024   AST 29 05/04/2024   ALKPHOS 55 05/04/2024   BILITOT 0.8 05/04/2024      Microbiology: Recent Results (from the past 240 hours)  Resp panel by RT-PCR (RSV, Flu A&B, Covid) Anterior Nasal Swab     Status: None   Collection Time: 05/03/24  2:37 PM   Specimen: Anterior Nasal Swab  Result Value Ref Range Status   SARS Coronavirus 2 by RT PCR NEGATIVE NEGATIVE Final   Influenza A by PCR NEGATIVE NEGATIVE Final   Influenza B by PCR NEGATIVE NEGATIVE Final    Comment: (NOTE) The Xpert Xpress SARS-CoV-2/FLU/RSV plus assay is intended as an aid in the diagnosis of influenza from Nasopharyngeal swab specimens and should not be used as a sole basis for treatment. Nasal washings and aspirates are unacceptable for Xpert Xpress SARS-CoV-2/FLU/RSV testing.  Fact Sheet for Patients: bloggercourse.com  Fact Sheet for Healthcare Providers: seriousbroker.it  This test is not yet approved or cleared by the [place]  FDA and has been authorized for detection and/or diagnosis of  SARS-CoV-2 by FDA under an Emergency Use Authorization (EUA). This EUA will remain in effect (meaning this test can be used) for the duration of the COVID-19 declaration under Section 564(b)(1) of the Act, 21 U.S.C. section 360bbb-3(b)(1), unless the authorization is terminated or revoked.     Resp Syncytial Virus by PCR NEGATIVE NEGATIVE Final    Comment: (NOTE) Fact Sheet for Patients: bloggercourse.com  Fact Sheet for Healthcare Providers: seriousbroker.it  This test is not yet approved or cleared by the [place]  FDA and has been authorized for detection and/or diagnosis of SARS-CoV-2 by FDA under an Emergency Use Authorization (EUA). This EUA will remain in effect (meaning this test can be used) for the duration of the COVID-19 declaration under Section 564(b)(1) of the Act, 21 U.S.C. section 360bbb-3(b)(1), unless the authorization is terminated or revoked.  Performed at [facility],  [address], [place], [place] [address]   Blood Culture (routine x 2)     Status: None (Preliminary result)   Collection Time: 05/03/24  3:12 PM   Specimen: BLOOD RIGHT ARM  Result Value Ref Range Status   Specimen Description BLOOD RIGHT ARM  Final   Special Requests   Final    BOTTLES DRAWN AEROBIC AND ANAEROBIC Blood Culture adequate volume   Culture   Final    NO GROWTH 2 DAYS Performed at [facility], [address], [place], [place] [address]    Report Status PENDING  Incomplete  Blood Culture (routine x 2)     Status: Abnormal (Preliminary result)   Collection Time: 05/03/24  3:15 PM   Specimen: BLOOD LEFT ARM  Result Value Ref Range Status   Specimen Description BLOOD LEFT ARM  Final   Special Requests   Final    BOTTLES DRAWN AEROBIC AND ANAEROBIC Blood Culture results may not be optimal due to an inadequate volume of blood received in culture bottles   Culture  Setup Time   Final    GRAM POSITIVE COCCI IN  CHAINS IN BOTH AEROBIC AND ANAEROBIC BOTTLES CRITICAL RESULT CALLED TO, READ BACK BY AND VERIFIED WITH: PHARMD J.FRENS AT 9076 ON 05/04/2024 BY T.SAAD.    Culture (A)  Final    STREPTOCOCCUS MITIS/ORALIS SUSCEPTIBILITIES TO FOLLOW Performed at [facility], [address], [place], [place] [address]    Report Status PENDING  Incomplete  Blood Culture ID Panel (Reflexed)     Status: Abnormal   Collection Time: 05/03/24  3:15 PM  Result Value Ref Range Status   Enterococcus faecalis NOT DETECTED NOT DETECTED Final   Enterococcus Faecium NOT DETECTED NOT DETECTED Final   Listeria monocytogenes NOT DETECTED NOT DETECTED Final   Staphylococcus species NOT DETECTED NOT DETECTED Final   Staphylococcus aureus (BCID) NOT DETECTED NOT DETECTED Final   Staphylococcus epidermidis NOT DETECTED NOT DETECTED Final   Staphylococcus lugdunensis NOT DETECTED NOT DETECTED Final   Streptococcus species DETECTED (A) NOT DETECTED Final    Comment: Not Enterococcus species, Streptococcus agalactiae, Streptococcus pyogenes, or Streptococcus pneumoniae. CRITICAL RESULT CALLED TO, READ BACK BY AND VERIFIED WITH: PHARMD J.FRENS AT 9076 ON 05/04/2024 BY T.SAAD.    Streptococcus agalactiae NOT DETECTED NOT DETECTED Final   Streptococcus pneumoniae NOT DETECTED NOT DETECTED Final   Streptococcus pyogenes NOT DETECTED NOT DETECTED Final   A.calcoaceticus-baumannii NOT DETECTED NOT DETECTED Final   Bacteroides fragilis NOT DETECTED NOT DETECTED Final   Enterobacterales NOT DETECTED NOT DETECTED Final   Enterobacter cloacae complex NOT DETECTED NOT DETECTED Final   Escherichia coli NOT DETECTED NOT DETECTED Final   Klebsiella aerogenes NOT DETECTED NOT DETECTED Final   Klebsiella oxytoca NOT DETECTED NOT DETECTED Final   Klebsiella pneumoniae NOT DETECTED NOT DETECTED Final   Proteus species NOT DETECTED NOT DETECTED Final   Salmonella species NOT DETECTED NOT DETECTED Final   Serratia marcescens NOT  DETECTED NOT DETECTED Final   Haemophilus influenzae NOT DETECTED NOT DETECTED Final   Neisseria meningitidis NOT DETECTED NOT DETECTED Final   Pseudomonas aeruginosa NOT DETECTED NOT DETECTED Final   Stenotrophomonas maltophilia NOT DETECTED NOT DETECTED Final   Candida albicans NOT DETECTED NOT DETECTED Final   Candida auris NOT DETECTED NOT DETECTED Final   Candida glabrata NOT DETECTED NOT DETECTED Final   Candida krusei NOT DETECTED NOT DETECTED Final   Candida parapsilosis NOT DETECTED NOT DETECTED Final   Candida  tropicalis NOT DETECTED NOT DETECTED Final   Cryptococcus neoformans/gattii NOT DETECTED NOT DETECTED Final    Comment: Performed at [facility], [address], [place], [place] [address]  MRSA culture     Status: None   Collection Time: 05/03/24  8:41 PM   Specimen: Nose; Body Fluid  Result Value Ref Range Status   Specimen Description NOSE  Final   Special Requests NONE  Final   Culture   Final    NO MRSA DETECTED Performed at [facility], [address], [place], [place] [address]    Report Status 05/05/2024 FINAL  Final     Serology:    Imaging: If present, new imagings (plain films, ct scans, and mri) have been personally visualized and interpreted; radiology reports have been reviewed. Decision making incorporated into the Impression / Recommendations.  05/03/24 ct chest abd pelv 1. Interval development of left lower lobe peribronchovascular consolidative airspace opacities with developing peribronchial airspace opacities in the lingula, compatible with infection or inflammation. 2. Other, non-acute and/or normal findings as above.  11/18 ct tibfib left 1. Punctate radiopaque density along the anterior proximal tibial shaft, possibly representing a retained foreign body. Differential diagnosis includes nonspecific calcification. 2. No soft tissue edema, emphysema, or organized fluid collection. 3. No acute fracture or findings to  suggest osteomyelitis.  11/18 ct head 1. No acute intracranial abnormality.     Constance ONEIDA Passer, MD [facility] [phone] pager    05/05/2024, 3:37 PM

## 2024-05-05 NOTE — Progress Notes (Addendum)
 Progress Note   Patient: Blake Chambers FMW:[id number] DOB: 1990/12/30 DOA: 05/03/2024     2 DOS: the patient was seen and examined on 05/05/2024   Brief hospital course: 33 y.o. male with medical history significant of polysubstance abuse including cocaine abuse, HIV disease, bipolar disorder, history of anxiety disorder, history of syphilis who presented to the ER initially complaining of leg pain and arm pain.  In the ER patient meets sepsis criteria with evidence of pneumonia.  He has been admitted with sepsis due to pneumonia.   Assessment and Plan:  Severe sepsis with shock due to possible bacterial community acquired left lower lobe pneumonia:  LA peaked at 6.6 in the setting of severe sepsis and bacteremia. Continue with intravenous fluids as well as antibiotics including Vanco and cefepime .   - Blood cultures positive for Strep mitis/oralis.  Strep mitis/Oralis bacteremia, POA: Management as mentioned above Concern for infective endocarditis in the setting of bacteremia as well as IV drug abuse and sepsis. Discussed with ID, Dr Overton.  Will consult cardiology for TEE   Polysubstance abuse: Drug screen is positive for amphetamines.  Blood cultures obtained to rule out bacteremia.   -Ordered TTE -Patient admits to using IV amphetamine 2 to 3 weeks back -Counseled extensively regarding cessation of any illicit drugs.   Acute metabolic encephalopathy: Improving now.  Likely multifactorial in the setting of polysubstance abuse as well as sepsis.  Continue to monitor. I have personally viewed the CT of the head which was done in the emergency room and it did not show any acute intracranial findings.   HIV infection: CD4 count 525 in June of this year.  HIV-1 RNA quantitative was 21,000 in July of this year.  Not known if patient has been compliant  Hyponatremia, likely hypovolemic.  Continue with normal saline follow-up BMP in the morning.   Bipolar disorder:  no acute issues    Tobacco abuse: Counseled extensively regarding tobacco cessation. He smokes half pack a day. Nicotine  patch ordered  Homelessness, POA: TOC consulted.  Disposition: He is currently homeless.  TOC consulted.        Subjective: Appears sleepy. Denied chest pain or shortness of breath.  Physical Exam: Vitals:   05/04/24 1609 05/04/24 1805 05/04/24 2251 05/05/24 0509  BP: 127/89 127/69 (!) 124/94 126/84  Pulse: (!) 122 (!) 119 (!) 106 96  Resp: 19  20 17   Temp: 98.8 F (37.1 C) 98.8 F (37.1 C) 99.7 F (37.6 C) 98.8 F (37.1 C)  TempSrc:  Oral Oral Oral  SpO2: 100% 96% 100% 99%   Constitutional: appears sleepy Eyes: PERRL, lids and conjunctivae normal ENMT: Mucous membranes are moist. Posterior pharynx clear of any exudate or lesions.Normal dentition.  Neck: normal, supple, no masses, no thyromegaly Respiratory: clear to auscultation bilaterally, no wheezing, no crackles. Normal respiratory effort. No accessory muscle use.  Cardiovascular: Regular rate and rhythm, no murmurs / rubs / gallops. No extremity edema. 2+ pedal pulses. No carotid bruits.  Abdomen: no tenderness, no masses palpated. No hepatosplenomegaly. Bowel sounds positive.  Musculoskeletal: no clubbing / cyanosis. No joint deformity upper and lower extremities. Good ROM, no contractures. Normal muscle tone.  Skin: no rashes, lesions, ulcers. No induration Neurologic: CN 2-12 grossly intact. Sensation intact, DTR normal. Strength 5/5 x all 4 extremities.   Data Reviewed:  There are no new results to review at this time.  Family Communication: None at the bedside  Disposition: Status is: Inpatient Remains inpatient appropriate because: Sepsis, pneumonia  Planned  Discharge Destination: Home    Time spent: 39 minutes  Author: Deliliah Room, MD 05/05/2024 9:36 AM  For on call review [email or web address].

## 2024-05-06 ENCOUNTER — Inpatient Hospital Stay (HOSPITAL_COMMUNITY): Payer: MEDICAID

## 2024-05-06 ENCOUNTER — Inpatient Hospital Stay (HOSPITAL_COMMUNITY): Payer: MEDICAID | Admitting: Anesthesiology

## 2024-05-06 ENCOUNTER — Encounter (HOSPITAL_COMMUNITY): Payer: Self-pay | Admitting: Internal Medicine

## 2024-05-06 ENCOUNTER — Encounter (HOSPITAL_COMMUNITY)
Admission: EM | Disposition: A | Discharge status: Home / Self Care | Payer: Self-pay | Source: Home / Self Care | Attending: Internal Medicine

## 2024-05-06 DIAGNOSIS — R7881 Bacteremia: Secondary | ICD-10-CM

## 2024-05-06 DIAGNOSIS — F419 Anxiety disorder, unspecified: Secondary | ICD-10-CM

## 2024-05-06 DIAGNOSIS — A419 Sepsis, unspecified organism: Secondary | ICD-10-CM | POA: Diagnosis not present

## 2024-05-06 DIAGNOSIS — J189 Pneumonia, unspecified organism: Secondary | ICD-10-CM | POA: Diagnosis not present

## 2024-05-06 HISTORY — PX: TRANSESOPHAGEAL ECHOCARDIOGRAM (CATH LAB): EP1270

## 2024-05-06 LAB — CBC WITH DIFFERENTIAL/PLATELET
Abs Immature Granulocytes: 0.1 K/uL — ABNORMAL HIGH (ref 0.00–0.07)
Basophils Absolute: 0 K/uL (ref 0.0–0.1)
Basophils Relative: 0 %
Eosinophils Absolute: 0.1 K/uL (ref 0.0–0.5)
Eosinophils Relative: 1 %
HCT: 39 % (ref 39.0–52.0)
Hemoglobin: 12.9 g/dL — ABNORMAL LOW (ref 13.0–17.0)
Immature Granulocytes: 1 %
Lymphocytes Relative: 20 %
Lymphs Abs: 1.6 K/uL (ref 0.7–4.0)
MCH: 26.1 pg (ref 26.0–34.0)
MCHC: 33.1 g/dL (ref 30.0–36.0)
MCV: 78.9 fL — ABNORMAL LOW (ref 80.0–100.0)
Monocytes Absolute: 0.6 K/uL (ref 0.1–1.0)
Monocytes Relative: 7 %
Neutro Abs: 5.4 K/uL (ref 1.7–7.7)
Neutrophils Relative %: 71 %
Platelets: 349 K/uL (ref 150–400)
RBC: 4.94 MIL/uL (ref 4.22–5.81)
RDW: 15.9 % — ABNORMAL HIGH (ref 11.5–15.5)
WBC: 7.8 K/uL (ref 4.0–10.5)
nRBC: 0 % (ref 0.0–0.2)

## 2024-05-06 LAB — ECHO TEE
AR max vel: 2.95 cm2
AV Area VTI: 3.34 cm2
AV Area mean vel: 2.78 cm2
AV Mean grad: 1 mmHg
AV Peak grad: 2.8 mmHg
Ao pk vel: 0.84 m/s

## 2024-05-06 LAB — CULTURE, BLOOD (ROUTINE X 2)

## 2024-05-06 LAB — BASIC METABOLIC PANEL WITH GFR
Anion gap: 11 (ref 5–15)
BUN: 9 mg/dL (ref 6–20)
CO2: 22 mmol/L (ref 22–32)
Calcium: 8.4 mg/dL — ABNORMAL LOW (ref 8.9–10.3)
Chloride: 99 mmol/L (ref 98–111)
Creatinine, Ser: 0.71 mg/dL (ref 0.61–1.24)
GFR, Estimated: >60 mL/min (ref >60)
Glucose, Bld: 93 mg/dL (ref 70–99)
Potassium: 3.7 mmol/L (ref 3.5–5.1)
Sodium: 132 mmol/L — ABNORMAL LOW (ref 135–145)

## 2024-05-06 LAB — T-HELPER CELLS (CD4) COUNT (NOT AT ARMC)
CD4 % Helper T Cell: 28 % — ABNORMAL LOW (ref 33–65)
CD4 T Cell Abs: 381 /uL — ABNORMAL LOW (ref 400–1790)

## 2024-05-06 LAB — SYPHILIS: RPR W/REFLEX TO RPR TITER AND TREPONEMAL ANTIBODIES, TRADITIONAL SCREENING AND DIAGNOSIS ALGORITHM
RPR Ser Ql: REACTIVE — AB
RPR Titer: 1:8 {titer}

## 2024-05-06 LAB — GLUCOSE, CAPILLARY: Glucose-Capillary: 94 mg/dL (ref 70–99)

## 2024-05-06 LAB — HEPATITIS C ANTIBODY: HCV Ab: REACTIVE — AB

## 2024-05-06 SURGERY — TRANSESOPHAGEAL ECHOCARDIOGRAM (TEE) (CATHLAB)
Anesthesia: Monitor Anesthesia Care

## 2024-05-06 MED ORDER — GLYCOPYRROLATE PF 0.2 MG/ML IJ SOSY
PREFILLED_SYRINGE | INTRAMUSCULAR | Status: DC | PRN
  Administered 2024-05-06: .2 mg via INTRAVENOUS

## 2024-05-06 MED ORDER — PROPOFOL 500 MG/50ML IV EMUL
INTRAVENOUS | Status: DC | PRN
  Administered 2024-05-06: 120 ug/kg/min via INTRAVENOUS

## 2024-05-06 MED ORDER — ALPRAZOLAM 0.25 MG PO TABS
0.2500 mg | ORAL_TABLET | Freq: Once | ORAL | Status: AC | PRN
  Administered 2024-05-06: 0.25 mg via ORAL
  Filled 2024-05-06: qty 1

## 2024-05-06 MED ORDER — SODIUM CHLORIDE 0.9 % IV SOLN: INTRAVENOUS | Status: DC

## 2024-05-06 MED ORDER — PROPOFOL 10 MG/ML IV BOLUS
INTRAVENOUS | Status: DC | PRN
  Administered 2024-05-06: 140 mg via INTRAVENOUS
  Administered 2024-05-06: 30 mg via INTRAVENOUS

## 2024-05-06 MED ORDER — BICTEGRAVIR-EMTRICITAB-TENOFOV 50-200-25 MG PO TABS
1.0000 | ORAL_TABLET | Freq: Every day | ORAL | Status: DC
  Administered 2024-05-06 – 2024-05-07 (×2): 1 via ORAL
  Filled 2024-05-06 (×2): qty 1

## 2024-05-06 NOTE — H&P (View-Only) (Signed)
 Progress Note   Patient: Blake Chambers FMW:[id number] DOB: 07/12/1990 DOA: 05/03/2024     3 DOS: the patient was seen and examined on 05/06/2024   Brief hospital course: 33 y.o. male with medical history significant of polysubstance abuse including cocaine abuse, HIV disease, bipolar disorder, history of anxiety disorder, history of syphilis who presented to the ER initially complaining of leg pain and arm pain.  In the ER patient meets sepsis criteria with evidence of pneumonia.  He has been admitted with sepsis due to pneumonia. He has gram-positive bacteremia.  ID and cardiology consulted.  TEE to be done today to rule out infective endocarditis.  Assessment and Plan:  Severe sepsis with shock due to possible bacterial community acquired left lower lobe pneumonia:  LA peaked at 6.6 in the setting of severe sepsis and bacteremia. Continue with intravenous fluids as well as antibiotics including Vanco and cefepime .   - Blood cultures positive for Strep mitis/oralis.  Strep mitis/Oralis bacteremia, POA: Management as mentioned above Concern for infective endocarditis in the setting of bacteremia as well as IV drug abuse and sepsis. Discussed with ID, Dr Overton.  Cardiology consulted.  Plan for TEE to be done today   Polysubstance abuse: Drug screen is positive for amphetamines.  Blood cultures obtained to rule out bacteremia.   -Ordered TTE -Patient admits to using IV amphetamine 2 to 3 weeks back -Counseled extensively regarding cessation of any illicit drugs.   Acute metabolic encephalopathy: Improving now.  Likely multifactorial in the setting of polysubstance abuse as well as sepsis.  Continue to monitor. I have personally viewed the CT of the head which was done in the emergency room and it did not show any acute intracranial findings.   HIV infection: CD4 count 525 in June of this year.  HIV-1 RNA quantitative was 21,000 in July of this year.  Not known if patient has been  compliant  Hyponatremia, likely hypovolemic.  Improving.  Continue with normal saline follow-up BMP in the morning.   Bipolar disorder:  no acute issues   Tobacco abuse: Counseled extensively regarding tobacco cessation. He smokes half pack a day. Nicotine  patch ordered  Homelessness, POA: TOC consulted.  Disposition: He is currently homeless.  TOC consulted.        Subjective: Appears sleepy.  He is hungry.  Denies chest pain or shortness of breath.  Physical Exam: Vitals:   05/06/24 0222 05/06/24 0524 05/06/24 0834 05/06/24 0931  BP:  126/87 116/79 125/88  Pulse:  85 86 88  Resp:  18 18 18   Temp:  97.7 F (36.5 C) 98.2 F (36.8 C) 98.5 F (36.9 C)  TempSrc:    Temporal  SpO2:  98% 98% 100%  Weight: 68 kg     Height: 5' 9 (1.753 m)      Constitutional: appears sleepy Eyes: PERRL, lids and conjunctivae normal ENMT: Mucous membranes are moist. Posterior pharynx clear of any exudate or lesions.Normal dentition.  Neck: normal, supple, no masses, no thyromegaly Respiratory: clear to auscultation bilaterally, no wheezing, no crackles. Normal respiratory effort. No accessory muscle use.  Cardiovascular: Regular rate and rhythm, no murmurs / rubs / gallops. No extremity edema. 2+ pedal pulses. No carotid bruits.  Abdomen: no tenderness, no masses palpated. No hepatosplenomegaly. Bowel sounds positive.  Musculoskeletal: no clubbing / cyanosis. No joint deformity upper and lower extremities. Good ROM, no contractures. Normal muscle tone.  Skin: no rashes, lesions, ulcers. No induration Neurologic: CN 2-12 grossly intact. Sensation intact, DTR normal. Strength  5/5 x all 4 extremities.   Data Reviewed:  There are no new results to review at this time.  Family Communication: None at the bedside  Disposition: Status is: Inpatient Remains inpatient appropriate because: Sepsis, pneumonia  Planned Discharge Destination: Home    Time spent: 39 minutes  Author: Deliliah Room, MD 05/06/2024 9:35 AM  For on call review [email or web address].

## 2024-05-06 NOTE — Anesthesia Postprocedure Evaluation (Signed)
 Anesthesia Post Note  Patient: Blake Chambers  Procedure(s) Performed: TRANSESOPHAGEAL ECHOCARDIOGRAM     Patient location during evaluation: PACU Anesthesia Type: MAC Level of consciousness: awake and alert Pain management: pain level controlled Vital Signs Assessment: post-procedure vital signs reviewed and stable Respiratory status: spontaneous breathing, nonlabored ventilation and respiratory function stable Cardiovascular status: stable and blood pressure returned to baseline Anesthetic complications: no   No notable events documented.  Last Vitals:  Vitals:   05/06/24 1153 05/06/24 1203  BP: 119/87 (!) 139/94  Pulse: 88 91  Resp: 16 13  Temp:    SpO2: 97% 96%    Last Pain:  Vitals:   05/06/24 1203  TempSrc:   PainSc: 0-No pain                 Debby FORBES Like

## 2024-05-06 NOTE — CV Procedure (Signed)
    TRANSESOPHAGEAL ECHOCARDIOGRAM   NAME:  Blake Chambers    MRN: [id number] DOB:  Jan 12, 1991    ADMIT DATE: 05/03/2024  INDICATIONS: Strep mitis/Oralis bacteremia  PROCEDURE:   Informed consent was obtained prior to the procedure. The risks, benefits and alternatives for the procedure were discussed and the patient comprehended these risks.  Risks include, but are not limited to, cough, sore throat, vomiting, nausea, somnolence, esophageal and stomach trauma or perforation, bleeding, low blood pressure, aspiration, pneumonia, infection, trauma to the teeth and death.    Procedural time out performed. The oropharynx was anesthetized.  Anesthesia was administered by anesthesia team (see their records).  The transesophageal probe was inserted in the esophagus and stomach without difficulty and multiple views were obtained.   COMPLICATIONS:    There were no immediate complications.  KEY FINDINGS:  LVEF 60-65%. No vegetation. Patient did not want me to convey the result to anyone else.   Full report to follow. Further management per primary team.   Madonna Large, DO, [facility]  A Division of [facility] [address] [place], [place] [address]  Pager: [phone] Office: [phone] 05/06/24 11:57 AM

## 2024-05-06 NOTE — Plan of Care (Signed)
  Problem: Fluid Volume: Goal: Hemodynamic stability will improve Outcome: Progressing   Problem: Clinical Measurements: Goal: Signs and symptoms of infection will decrease Outcome: Progressing   Problem: Respiratory: Goal: Ability to maintain adequate ventilation will improve Outcome: Progressing   Problem: Education: Goal: Knowledge of General Education information will improve Description: Including pain rating scale, medication(s)/side effects and non-pharmacologic comfort measures Outcome: Progressing

## 2024-05-06 NOTE — Anesthesia Preprocedure Evaluation (Addendum)
 Anesthesia Evaluation  Patient identified by MRN, date of birth, ID band Patient awake    Reviewed: Allergy & Precautions, NPO status , Patient's Chart, lab work & pertinent test results  History of Anesthesia Complications Negative for: history of anesthetic complications  Airway Mallampati: IV  TM Distance: >3 FB Neck ROM: Full   Comment:  Poor effort opening mouth, doubt true Mallampati  Dental  (+) Dental Advisory Given   Pulmonary Current Smoker and Patient abstained from smoking.   Pulmonary exam normal        Cardiovascular negative cardio ROS Normal cardiovascular exam   '25 TTE - Normal EF, no significant valvular d/o identified.    Neuro/Psych  PSYCHIATRIC DISORDERS Anxiety  Bipolar Disorder    Malingering negative neurological ROS     GI/Hepatic negative GI ROS,,,(+)     substance abuse  cocaine use and marijuana use  Endo/Other   Na 132   Renal/GU negative Renal ROS     Musculoskeletal  (+) Arthritis ,    Abdominal   Peds  Hematology  (+) HIV  Anesthesia Other Findings Syphilis HSV   Reproductive/Obstetrics                              Anesthesia Physical Anesthesia Plan  ASA: 2  Anesthesia Plan: MAC   Post-op Pain Management: Minimal or no pain anticipated   Induction:   PONV Risk Score and Plan: 0 and Propofol  infusion and Treatment may vary due to age or medical condition  Airway Management Planned: Nasal Cannula and Natural Airway  Additional Equipment: None  Intra-op Plan:   Post-operative Plan:   Informed Consent: I have reviewed the patients History and Physical, chart, labs and discussed the procedure including the risks, benefits and alternatives for the proposed anesthesia with the patient or authorized representative who has indicated his/her understanding and acceptance.       Plan Discussed with: CRNA and  Anesthesiologist  Anesthesia Plan Comments:          Anesthesia Quick Evaluation

## 2024-05-06 NOTE — Interval H&P Note (Addendum)
 History and Physical Interval Note:  05/06/2024 10:28 AM  Blake Chambers  has presented today for surgery, with the diagnosis of bacteremia.  The various methods of treatment have been discussed with the patient and family. After consideration of risks, benefits and other options for treatment, the patient has consented to  Procedure(s): TRANSESOPHAGEAL ECHOCARDIOGRAM (N/A) as a surgical intervention.  The patient's history has been reviewed, patient examined, no change in status, stable for surgery.  I have reviewed the patient's chart and labs.  Questions were answered to the patient's satisfaction.    Informed Consent   Shared Decision Making/Informed Consent   The risks [esophageal damage, perforation (1:10,000 risk), bleeding, pharyngeal hematoma as well as other potential complications associated with conscious sedation including aspiration, arrhythmia, respiratory failure and death], benefits (treatment guidance and diagnostic support) and alternatives of a transesophageal echocardiogram were discussed in detail with [name] and he is willing to proceed.      Contact Person: Garrel Fickle (patient's ex) [phone]. Mom Harlene Fass - patient did not know her phone number.   Dr. Michele 10:29 AM 05/06/24

## 2024-05-06 NOTE — Transfer of Care (Signed)
 Immediate Anesthesia Transfer of Care Note  Patient: Blake Chambers  Procedure(s) Performed: TRANSESOPHAGEAL ECHOCARDIOGRAM  Patient Location: PACU and Cath Lab  Anesthesia Type:MAC  Level of Consciousness: awake, drowsy, patient cooperative, and responds to stimulation  Airway & Oxygen Therapy: Patient Spontanous Breathing and Patient connected to nasal cannula oxygen  Post-op Assessment: Report given to RN and Post -op Vital signs reviewed and stable  Post vital signs: Reviewed and stable  Last Vitals:  Vitals Value Taken Time  BP    Temp    Pulse    Resp    SpO2      Last Pain:  Vitals:   05/06/24 1114  TempSrc:   PainSc: 0-No pain         Complications: No notable events documented.

## 2024-05-06 NOTE — Progress Notes (Signed)
 Progress Note   Patient: Blake Chambers FMW:[id number] DOB: 07/12/1990 DOA: 05/03/2024     3 DOS: the patient was seen and examined on 05/06/2024   Brief hospital course: 33 y.o. male with medical history significant of polysubstance abuse including cocaine abuse, HIV disease, bipolar disorder, history of anxiety disorder, history of syphilis who presented to the ER initially complaining of leg pain and arm pain.  In the ER patient meets sepsis criteria with evidence of pneumonia.  He has been admitted with sepsis due to pneumonia. He has gram-positive bacteremia.  ID and cardiology consulted.  TEE to be done today to rule out infective endocarditis.  Assessment and Plan:  Severe sepsis with shock due to possible bacterial community acquired left lower lobe pneumonia:  LA peaked at 6.6 in the setting of severe sepsis and bacteremia. Continue with intravenous fluids as well as antibiotics including Vanco and cefepime .   - Blood cultures positive for Strep mitis/oralis.  Strep mitis/Oralis bacteremia, POA: Management as mentioned above Concern for infective endocarditis in the setting of bacteremia as well as IV drug abuse and sepsis. Discussed with ID, Dr Overton.  Cardiology consulted.  Plan for TEE to be done today   Polysubstance abuse: Drug screen is positive for amphetamines.  Blood cultures obtained to rule out bacteremia.   -Ordered TTE -Patient admits to using IV amphetamine 2 to 3 weeks back -Counseled extensively regarding cessation of any illicit drugs.   Acute metabolic encephalopathy: Improving now.  Likely multifactorial in the setting of polysubstance abuse as well as sepsis.  Continue to monitor. I have personally viewed the CT of the head which was done in the emergency room and it did not show any acute intracranial findings.   HIV infection: CD4 count 525 in June of this year.  HIV-1 RNA quantitative was 21,000 in July of this year.  Not known if patient has been  compliant  Hyponatremia, likely hypovolemic.  Improving.  Continue with normal saline follow-up BMP in the morning.   Bipolar disorder:  no acute issues   Tobacco abuse: Counseled extensively regarding tobacco cessation. He smokes half pack a day. Nicotine  patch ordered  Homelessness, POA: TOC consulted.  Disposition: He is currently homeless.  TOC consulted.        Subjective: Appears sleepy.  He is hungry.  Denies chest pain or shortness of breath.  Physical Exam: Vitals:   05/06/24 0222 05/06/24 0524 05/06/24 0834 05/06/24 0931  BP:  126/87 116/79 125/88  Pulse:  85 86 88  Resp:  18 18 18   Temp:  97.7 F (36.5 C) 98.2 F (36.8 C) 98.5 F (36.9 C)  TempSrc:    Temporal  SpO2:  98% 98% 100%  Weight: 68 kg     Height: 5' 9 (1.753 m)      Constitutional: appears sleepy Eyes: PERRL, lids and conjunctivae normal ENMT: Mucous membranes are moist. Posterior pharynx clear of any exudate or lesions.Normal dentition.  Neck: normal, supple, no masses, no thyromegaly Respiratory: clear to auscultation bilaterally, no wheezing, no crackles. Normal respiratory effort. No accessory muscle use.  Cardiovascular: Regular rate and rhythm, no murmurs / rubs / gallops. No extremity edema. 2+ pedal pulses. No carotid bruits.  Abdomen: no tenderness, no masses palpated. No hepatosplenomegaly. Bowel sounds positive.  Musculoskeletal: no clubbing / cyanosis. No joint deformity upper and lower extremities. Good ROM, no contractures. Normal muscle tone.  Skin: no rashes, lesions, ulcers. No induration Neurologic: CN 2-12 grossly intact. Sensation intact, DTR normal. Strength  5/5 x all 4 extremities.   Data Reviewed:  There are no new results to review at this time.  Family Communication: None at the bedside  Disposition: Status is: Inpatient Remains inpatient appropriate because: Sepsis, pneumonia  Planned Discharge Destination: Home    Time spent: 39 minutes  Author: Deliliah Room, MD 05/06/2024 9:35 AM  For on call review [email or web address].

## 2024-05-06 NOTE — TOC CM/SW Note (Signed)
 Transition of Care [facility]) - Inpatient Brief Assessment   Patient Details  Name: Blake Chambers MRN: [id number] Date of Birth: Nov 05, 1990  [facility] CM/SW Contact:    Tom-Johnson, Harvest Muskrat, RN Phone Number: 05/06/2024, 3:32 PM   Clinical Narrative:  CM went to assess patient at bedside, patient states he was not in the mood to talk, too tired. CM informed patient to call when he is ready and CM will return at an appropriate time.  CM will continue to follow as patient progresses with care towards discharge.      Transition of Care Asessment:

## 2024-05-06 NOTE — Plan of Care (Signed)
 Id brief note   11/21 tee no vegetation Afebrile Leukocytosis resolved  11/21 blood cx repeat in process    A/p: Strep mitis bsi Hiv/aids  Lab Results  Component Value Date   HIV1RNAQUANT 21,000 01/10/2024   Lab Results  Component Value Date   CD4TCELL 28 (L) 05/06/2024   CD4TABS 381 (L) 05/06/2024   Pending repeat hiv viral load result   Hx syphilis s/p tx Rpr improving 32 --> 8  -restart biktarvy  -- please provide 30 day biktarvy  on hand -id clinic f/u arranged 05/26/24 @ 3pm -will follow labs -finish 7 day antibiotics for strep mitis bsi; can transition to oral abx today with cefadroxil  1 gram po bid and finish on 11/25 -discussed with primary team

## 2024-05-07 ENCOUNTER — Other Ambulatory Visit (HOSPITAL_COMMUNITY): Payer: Self-pay

## 2024-05-07 ENCOUNTER — Encounter (HOSPITAL_COMMUNITY): Payer: Self-pay | Admitting: Cardiology

## 2024-05-07 DIAGNOSIS — A419 Sepsis, unspecified organism: Secondary | ICD-10-CM | POA: Diagnosis not present

## 2024-05-07 DIAGNOSIS — J189 Pneumonia, unspecified organism: Secondary | ICD-10-CM | POA: Diagnosis not present

## 2024-05-07 LAB — BASIC METABOLIC PANEL WITH GFR
Anion gap: 11 (ref 5–15)
BUN: 18 mg/dL (ref 6–20)
CO2: 22 mmol/L (ref 22–32)
Calcium: 8.8 mg/dL — ABNORMAL LOW (ref 8.9–10.3)
Chloride: 101 mmol/L (ref 98–111)
Creatinine, Ser: 0.92 mg/dL (ref 0.61–1.24)
GFR, Estimated: >60 mL/min (ref >60)
Glucose, Bld: 109 mg/dL — ABNORMAL HIGH (ref 70–99)
Potassium: 3.8 mmol/L (ref 3.5–5.1)
Sodium: 134 mmol/L — ABNORMAL LOW (ref 135–145)

## 2024-05-07 LAB — HIV-1 RNA QUANT-NO REFLEX-BLD
HIV 1 RNA Quant: 2520 {copies}/mL
LOG10 HIV-1 RNA: 3.401 {Log_copies}/mL

## 2024-05-07 MED ORDER — CEFADROXIL 500 MG PO CAPS
1000.0000 mg | ORAL_CAPSULE | Freq: Two times a day (BID) | ORAL | Status: DC
  Administered 2024-05-07: 1000 mg via ORAL
  Filled 2024-05-07: qty 2

## 2024-05-07 MED ORDER — CEFADROXIL 500 MG PO CAPS
1000.0000 mg | ORAL_CAPSULE | Freq: Two times a day (BID) | ORAL | 0 refills | Status: AC
  Filled 2024-05-07: qty 8, 2d supply, fill #0

## 2024-05-07 MED ORDER — BICTEGRAVIR-EMTRICITAB-TENOFOV 50-200-25 MG PO TABS
1.0000 | ORAL_TABLET | Freq: Every day | ORAL | 1 refills | Status: AC
  Filled 2024-05-07: qty 30, 30d supply, fill #0

## 2024-05-07 MED ORDER — NICOTINE 21 MG/24HR TD PT24
21.0000 mg | MEDICATED_PATCH | Freq: Every day | TRANSDERMAL | 0 refills | Status: AC
  Filled 2024-05-07: qty 14, 14d supply, fill #0

## 2024-05-07 NOTE — Discharge Summary (Signed)
 Physician Discharge Summary   Patient: Blake Chambers MRN: [id number] DOB: 02-08-1991  Admit date:     05/03/2024  Discharge date: 05/07/24  Discharge Physician: Blake Chambers   PCP: [facility]   Recommendations at discharge:    F/u with PCP in one week F/u in ID clinic on 05/26/24 at 3 pm Continue take medication as prescribed. Avoid using any illicit drugs. Return to the emergency Chambers if you develop fever, chest pain, abdominal pain or shortness of breath.  Discharge Diagnoses: Principal Problem:   Sepsis due to pneumonia [facility]) Active Problems:   HIV disease (HCC)   Tobacco use disorder   Bipolar I disorder (HCC)   Chronic renal insufficiency, stage II (mild)   Polysubstance abuse [facility])   Hospital Course:  Severe sepsis with shock due to possible bacterial community acquired left lower lobe pneumonia: Altered LA peaked at 6.6 in the setting of severe sepsis and bacteremia. Intravenous fluids as well as intravenous Vanco and cefepime .  Discharged on oral cefadroxil  1000 mg twice daily until 05/10/2024 as per ID recommendation - Blood cultures positive for Strep mitis/oralis.  TEE was done with did not show evidence of infective endocarditis.  Patient will follow-up outpatient with ID clinic on 05/26/2024 at 3 PM   Strep mitis/Oralis bacteremia, POA: Management as mentioned above Discussed with ID, Dr Blake. TEE ruled out infective endocarditis   Polysubstance abuse: Drug screen is positive for amphetamines.  Blood cultures obtained to rule out bacteremia.   -Patient admits to using IV amphetamine 2 to 3 weeks back -Counseled extensively regarding cessation of any illicit drugs.   Acute metabolic encephalopathy: Resolved.  Likely multifactorial in the setting of polysubstance abuse as well as sepsis.  Continue to monitor. I have personally viewed the CT of the head which was done in the emergency Chambers and it did not show any acute intracranial  findings.   HIV infection: CD4 count 525 in June of this year.  HIV-1 RNA quantitative was 21,000 in July of this year.  Not known if patient has been compliant Given prescription for Biktarvy    Hyponatremia, likely hypovolemic.  Improved with intravenous fluids   Bipolar disorder:  no acute issues   Tobacco abuse: Counseled extensively regarding tobacco cessation. He smokes half pack a day. Nicotine  patch ordered Disposition: Home       Consultants: Infectious disease, cardiology Procedures performed: TEE Disposition: Home Diet recommendation:  Cardiac diet DISCHARGE MEDICATION: Allergies as of 05/07/2024       Reactions   Blueberry Flavoring Agent (non-screening) Hives   Pineapple Anaphylaxis   Tenofovir  Disoproxil Other (See Comments)   Intolerance, increase in creatinine   (TRUVADA)    Kiwi Extract Swelling        Medication List     TAKE these medications    acetaminophen  500 MG tablet Commonly known as: TYLENOL  Take 1 tablet (500 mg total) by mouth every 6 (six) hours as needed.   bictegravir-emtricitabine -tenofovir  AF 50-200-25 MG Tabs tablet Commonly known as: BIKTARVY  Take 1 tablet by mouth daily.   cefadroxil  500 MG capsule Commonly known as: DURICEF Take 2 capsules (1,000 mg total) by mouth 2 (two) times daily.   clotrimazole  1 % cream Commonly known as: Clotrimazole  AF Apply 1 Application topically 2 (two) times daily.   nicotine  21 mg/24hr patch Commonly known as: NICODERM CQ  - dosed in mg/24 hours Place 1 patch (21 mg total) onto the skin daily. Start taking on: May 08, 2024  ondansetron  8 MG disintegrating tablet Commonly known as: ZOFRAN -ODT Take 1 tablet (8 mg total) by mouth every 8 (eight) hours as needed for nausea   pantoprazole  40 MG tablet Commonly known as: PROTONIX  Take 1 tablet (40 mg total) by mouth daily.   valACYclovir  1000 MG tablet Commonly known as: Valtrex  Take 1 tablet (1,000 mg total) by mouth 2 (two)  times daily.        Follow-up Information     [facility]. Schedule an appointment as soon as possible for a visit in 1 week(s).   Specialty: Pediatrics Contact information: [address] [place] [address] [phone]         Blake Constance DASEN, MD. Go on 05/26/2024.   Specialty: Infectious Diseases Why: 05/26/24 at 3 pm Contact information: [address] [place] [address] [phone]                Discharge Exam: Blake Chambers   05/06/24 0222  Weight: 68 kg   Constitutional: NAD, calm, comfortable Eyes: PERRL, lids and conjunctivae normal ENMT: Mucous membranes are moist. Posterior pharynx clear of any exudate or lesions.Normal dentition.  Neck: normal, supple, no masses, no thyromegaly Respiratory: clear to auscultation bilaterally, no wheezing, no crackles. Normal respiratory effort. No accessory muscle use.  Cardiovascular: Regular rate and rhythm, no murmurs / rubs / gallops. No extremity edema. 2+ pedal pulses. No carotid bruits.  Abdomen: no tenderness, no masses palpated. No hepatosplenomegaly. Bowel sounds positive.  Musculoskeletal: no clubbing / cyanosis. No joint deformity upper and lower extremities. Good ROM, no contractures. Normal muscle tone.  Skin: no rashes, lesions, ulcers. No induration Neurologic: CN 2-12 grossly intact. Sensation intact, DTR normal. Strength 5/5 x all 4 extremities.  Psychiatric: Normal judgment and insight. Alert and oriented x 3. Normal mood.    Condition at discharge: good  The results of significant diagnostics from this hospitalization (including imaging, microbiology, ancillary and laboratory) are listed below for reference.   Imaging Studies: ECHO TEE Result Date: 05/06/2024    TRANSESOPHOGEAL ECHO REPORT   Patient Name:   Blake Chambers Date of Exam: 05/06/2024 Medical Rec #:  [id number]      Height:       69.0 in Accession #:    [id number]     Weight:       149.9  lb Date of Birth:  [birth year]      BSA:          1.828 m Patient Age:    33 years       BP:           120/94 mmHg Patient Gender: M              HR:           103 bpm. Exam Location:  Inpatient Procedure: Transesophageal Echo, Color Doppler, Cardiac Doppler and Saline            Contrast Bubble Study (Both Spectral and Color Flow Doppler were            utilized during procedure). Indications:     Bacteremia  History:         Patient has prior history of Echocardiogram examinations, most                  recent 05/05/2024.  Sonographer:     Tinnie Gosling RDCS Referring Phys:  [id number] TAYLOR A PARCELLS Diagnosing Phys: Madonna Large PROCEDURE: After discussion  of the risks and benefits of a TEE, an informed consent was obtained from the patient. The transesophogeal probe was passed without difficulty through the esophogus of the patient. Sedation performed by different physician. The patient was monitored while under deep sedation. Anesthestetic sedation was provided intravenously by Anesthesiology: 346mg  of Propofol . Image quality was good. The patient's vital signs; including heart rate, blood pressure, and oxygen saturation; remained stable throughout the procedure. The patient developed no complications during the procedure.  IMPRESSIONS  1. Left ventricular ejection fraction, by estimation, is 60 to 65%. The left ventricle has normal function. The left ventricle has no regional wall motion abnormalities.  2. Right ventricular systolic function is normal. The right ventricular size is normal.  3. No left atrial/left atrial appendage thrombus was detected.  4. The mitral valve is normal in structure. No evidence of mitral valve regurgitation. No evidence of mitral stenosis.  5. The aortic valve is tricuspid. Aortic valve regurgitation is not visualized. No aortic stenosis is present.  6. There is mild (Grade II) layered plaque involving the descending aorta.  7. Interatrial septum was not well-visualized, based on  the visualized segment no shunting by color Doppler.. Saline contrast bubble study is equivocal for interatrial shunt, cannot rule out the possibility of small PFO. Conclusion(s)/Recommendation(s): No evidence of vegetation/infective endocarditis on this transesophageael echocardiogram. FINDINGS  Left Ventricle: Left ventricular ejection fraction, by estimation, is 60 to 65%. The left ventricle has normal function. The left ventricle has no regional wall motion abnormalities. The left ventricular internal cavity size was normal in size. There is  no left ventricular hypertrophy. Left ventricular diastolic function could not be evaluated due to nondiagnostic images. Right Ventricle: The right ventricular size is normal. Right vetricular wall thickness was not well visualized. Right ventricular systolic function is normal. Left Atrium: Left atrial size was normal in size. No left atrial/left atrial appendage thrombus was detected. Right Atrium: Right atrial size was normal in size. Pericardium: There is no evidence of pericardial effusion. Mitral Valve: The mitral valve is normal in structure. No evidence of mitral valve regurgitation. No evidence of mitral valve stenosis. Tricuspid Valve: The tricuspid valve is normal in structure. Tricuspid valve regurgitation is not demonstrated. No evidence of tricuspid stenosis. Aortic Valve: The aortic valve is tricuspid. Aortic valve regurgitation is not visualized. No aortic stenosis is present. Aortic valve mean gradient measures 1.0 mmHg. Aortic valve peak gradient measures 2.8 mmHg. Aortic valve area, by VTI measures 3.34 cm. Pulmonic Valve: The pulmonic valve was normal in structure. Pulmonic valve regurgitation is not visualized. No evidence of pulmonic stenosis. Aorta: The aortic root is normal in size and structure. There is mild (Grade II) layered plaque involving the descending aorta. Venous: The left upper pulmonary vein, right upper pulmonary vein and right lower  pulmonary vein are normal. IAS/Shunts: Interatrial septum was not well-visualized, based on the visualized segment no shunting by color Doppler. Agitated saline contrast was given intravenously to evaluate for intracardiac shunting. Saline contrast bubble study is equivocal for interatrial shunt, cannot rule out the possibility of small PFO. Additional Comments: Spectral Doppler performed. LEFT VENTRICLE PLAX 2D LVOT diam:     2.20 cm LV SV:         39 LV SV Index:   21 LVOT Area:     3.80 cm  AORTIC VALVE AV Area (Vmax):    2.95 cm AV Area (Vmean):   2.78 cm AV Area (VTI):     3.34 cm AV Vmax:  83.50 cm/s AV Vmean:          53.100 cm/s AV VTI:            0.116 m AV Peak Grad:      2.8 mmHg AV Mean Grad:      1.0 mmHg LVOT Vmax:         64.80 cm/s LVOT Vmean:        38.800 cm/s LVOT VTI:          0.102 m LVOT/AV VTI ratio: 0.88  AORTA Ao Root diam: 3.20 cm  SHUNTS Systemic VTI:  0.10 m Systemic Diam: 2.20 cm Sunit Tolia Electronically signed by Madonna Large Signature Date/Time: 05/06/2024/2:56:32 PM    Final    ECHOCARDIOGRAM COMPLETE Result Date: 05/05/2024    ECHOCARDIOGRAM REPORT   Patient Name:   CHAVEZ ROSOL Date of Exam: 05/05/2024 Medical Rec #:  [id number]      Height:       69.0 in Accession #:    [id number]     Weight:       150.0 lb Date of Birth:  06-Oct-1990      BSA:          1.828 m Patient Age:    33 years       BP:           131/91 mmHg Patient Gender: M              HR:           105 bpm. Exam Location:  Inpatient Procedure: 2D Echo, Color Doppler and Cardiac Doppler (Both Spectral and Color            Flow Doppler were utilized during procedure). Indications:    Bacteremia  History:        Patient has no prior history of Echocardiogram examinations.                 Risk Factors:Current Smoker and substance use.  Sonographer:    Merlynn Argyle Referring Phys: [id number] [facility] Taylyn Brame  Sonographer Comments: Image acquisition challenging due to respiratory motion and Image acquisition  challenging due to uncooperative patient. IMPRESSIONS  1. Left ventricular ejection fraction, by estimation, is 60 to 65%. The left ventricle has normal function. The left ventricle has no regional wall motion abnormalities. Left ventricular diastolic parameters were normal.  2. Right ventricular systolic function is normal. The right ventricular size is normal. Tricuspid regurgitation signal is inadequate for assessing PA pressure.  3. The mitral valve is normal in structure. No evidence of mitral valve regurgitation. No evidence of mitral stenosis.  4. The aortic valve is grossly normal. Aortic valve regurgitation is not visualized. No aortic stenosis is present.  5. The inferior vena cava is normal in size with greater than 50% respiratory variability, suggesting right atrial pressure of 3 mmHg. Conclusion(s)/Recommendation(s): No evidence of valvular vegetations on this transthoracic echocardiogram. Consider a transesophageal echocardiogram to exclude infective endocarditis if clinically indicated. FINDINGS  Left Ventricle: Left ventricular ejection fraction, by estimation, is 60 to 65%. The left ventricle has normal function. The left ventricle has no regional wall motion abnormalities. The left ventricular internal cavity size was normal in size. There is  no left ventricular hypertrophy. Left ventricular diastolic parameters were normal. Right Ventricle: The right ventricular size is normal. No increase in right ventricular wall thickness. Right ventricular systolic function is normal. Tricuspid regurgitation signal is inadequate for assessing PA pressure. Left Atrium: Left atrial size was normal  in size. Right Atrium: Right atrial size was normal in size. Pericardium: There is no evidence of pericardial effusion. Mitral Valve: The mitral valve is normal in structure. Mild mitral annular calcification. No evidence of mitral valve regurgitation. No evidence of mitral valve stenosis. Tricuspid Valve: The  tricuspid valve is normal in structure. Tricuspid valve regurgitation is not demonstrated. No evidence of tricuspid stenosis. Aortic Valve: The aortic valve is grossly normal. Aortic valve regurgitation is not visualized. No aortic stenosis is present. Pulmonic Valve: The pulmonic valve was normal in structure. Pulmonic valve regurgitation is not visualized. No evidence of pulmonic stenosis. Aorta: The aortic root is normal in size and structure. Venous: The inferior vena cava is normal in size with greater than 50% respiratory variability, suggesting right atrial pressure of 3 mmHg. IAS/Shunts: No atrial level shunt detected by color flow Doppler.  LEFT VENTRICLE PLAX 2D LVIDd:         4.30 cm   Diastology LVIDs:         3.20 cm   LV e' medial:    8.59 cm/s LV PW:         0.80 cm   LV E/e' medial:  9.9 LV IVS:        0.70 cm   LV e' lateral:   10.90 cm/s LVOT diam:     2.00 cm   LV E/e' lateral: 7.8 LV SV:         42 LV SV Index:   23 LVOT Area:     3.14 cm LV IVRT:       120 msec  RIGHT VENTRICLE             IVC RV Basal diam:  3.40 cm     IVC diam: 1.60 cm RV S prime:     12.00 cm/s TAPSE (M-mode): 1.7 cm LEFT ATRIUM             Index        RIGHT ATRIUM           Index LA diam:        3.10 cm 1.70 cm/m   RA Area:     11.50 cm LA Vol (A2C):   27.2 ml 14.88 ml/m  RA Volume:   27.20 ml  14.88 ml/m LA Vol (A4C):   28.4 ml 15.53 ml/m LA Biplane Vol: 30.1 ml 16.46 ml/m  AORTIC VALVE LVOT Vmax:   90.20 cm/s LVOT Vmean:  63.600 cm/s LVOT VTI:    0.133 m  AORTA Ao Root diam: 3.00 cm Ao Asc diam:  3.30 cm MITRAL VALVE MV Area (PHT): 3.21 cm    SHUNTS MV Decel Time: 236 msec    Systemic VTI:  0.13 m MV E velocity: 85.20 cm/s  Systemic Diam: 2.00 cm MV A velocity: 74.10 cm/s MV E/A ratio:  1.15 Soyla Merck MD Electronically signed by Soyla Merck MD Signature Date/Time: 05/05/2024/11:53:21 PM    Final    CT TIBIA FIBULA LEFT W WO CONTRAST Result Date: 05/03/2024 EXAM: CT LEFT LOWER EXTREMITY, WITH AND  WITHOUT IV CONTRAST 05/03/2024 05:34:00 PM TECHNIQUE: Axial images were acquired through the left lower extremity with and without IV contrast. Reformatted images were reviewed. Automated exposure control, iterative reconstruction, and/or weight based adjustment of the mA/kV was utilized to reduce the radiation dose to as low as reasonably achievable. 75 mL of iohexol  (OMNIPAQUE ) 350 MG/ML injection was administered. COMPARISON: None available. CLINICAL HISTORY: prev dog bite left calf wound, pain, sepsis FINDINGS:  BONES AND JOINTS: Punctate density along the anterior leg at the level of the proximal tibial shaft that may represent retained radiopaque foreign body (11:106). No acute fracture or focal osseous lesion. No dislocation. The joint spaces are normal. No cortical erosion or destruction. SOFT TISSUES: No suspicious soft tissue edema, emphysema, organized fluid collection. IMPRESSION: 1. Punctate radiopaque density along the anterior proximal tibial shaft, possibly representing a retained foreign body. Differential diagnosis includes nonspecific calcification. 2. No soft tissue edema, emphysema, or organized fluid collection. 3. No acute fracture or findings to suggest osteomyelitis. Electronically signed by: Morgane Naveau MD 05/03/2024 06:01 PM EST RP Workstation: [id number]   CT CHEST ABDOMEN PELVIS W CONTRAST Result Date: 05/03/2024 EXAM: CT CHEST ABDOMEN PELVIS WITH THORACIC AND LUMBAR SPINE RECONSTRUCTIONS 05/03/2024 05:34:00 PM TECHNIQUE: CT of the chest, abdomen, pelvis was performed after the administration of 75 mL of intravenous contrast (iohexol  (OMNIPAQUE ) 350 MG/ML injection). Multiplanar reformatted images are provided for review, including reconstructed images of the thoracic and lumbar spine. Automated exposure control, iterative reconstruction, and/or weight based adjustment of the mA/kV was utilized to reduce the radiation dose to as low as reasonably achievable. COMPARISON: CT chest  and pelvis 01/09/2024. CLINICAL HISTORY: AMS, fever, sepsis, hx IVDU, possible lung opacity on CXR, left leg wound from dog bite 3 weeks ago-external exam looks noninfectious. FINDINGS: CT CHEST: THORACIC AORTA: No acute traumatic injury of the aorta. MEDIASTINUM: No mediastinal hematoma or pneumomediastinum. No acute traumatic injury to the heart or pericardium. The central airways are clear. LUNGS: Interval development of left lower lobe peribronchovascular consolidative airspace opacities. Developing vague peribronchial airspace opacities within the lingula. Interval resolution of right lower lobe airspace opacity. No acute traumatic injury to the lungs. No pulmonary contusion or laceration. No effusion or pneumothorax. CHEST WALL: Bilateral gynecomastia. No acute displaced rib fracture. No chest wall hematoma. CT ABDOMEN AND PELVIS: ABDOMINAL AORTA: No acute traumatic injury of the aorta or iliac arteries. HEPATOBILIARY: Contracted gallbladder. No acute traumatic injury. SPLEEN: No acute traumatic injury. PANCREAS: No acute traumatic injury. ADRENAL GLANDS: No acute traumatic injury. KIDNEYS: No acute traumatic injury. No hydronephrosis. GI TRACT: The appendix is unremarkable. Small thickening of a short segment of small bowel within the right lower quadrant with question associated small to small intussusception (8:98). No associated large bowel. No large bowel wall thickening. No small or large bowel dilatation. No acute traumatic injury of the bowel. No bowel obstruction. PERITONEUM: No ascites or free air. RETROPERITONEUM: No retroperitoneal hematoma. BLADDER: No acute abnormality. REPRODUCTIVE ORGANS: No acute abnormality. BONES: No acute traumatic fracture of the pelvis. THORACIC AND LUMBAR SPINE: BONES AND ALIGNMENT: No traumatic fracture or traumatic malalignment. DEGENERATIVE CHANGES: No severe spinal canal stenosis or bony neural foraminal narrowing. No cortical erosion or destruction. SOFT TISSUES: No  paraspinal mass or hematoma. IMPRESSION: 1. Interval development of left lower lobe peribronchovascular consolidative airspace opacities with developing peribronchial airspace opacities in the lingula, compatible with infection or inflammation. 2. Other, non-acute and/or normal findings as above. Electronically signed by: Morgane Naveau MD 05/03/2024 05:57 PM EST RP Workstation: [id number]   CT Head Wo Contrast Result Date: 05/03/2024 EXAM: CT HEAD WITHOUT CONTRAST 05/03/2024 05:34:00 PM TECHNIQUE: CT of the head was performed without the administration of intravenous contrast. Automated exposure control, iterative reconstruction, and/or weight based adjustment of the mA/kV was utilized to reduce the radiation dose to as low as reasonably achievable. COMPARISON: 01/09/2024 CLINICAL HISTORY: Mental status change, unknown cause. FINDINGS: BRAIN AND VENTRICLES: No acute hemorrhage. No evidence  of acute infarct. No hydrocephalus. No extra-axial collection. No mass effect or midline shift. ORBITS: Remote left medial orbital wall fracture. SINUSES: Mucosal thickening left frontal sinus and anterior left ethmoid air cells. Mild mucosal thickening in maxillary sinuses bilaterally. SOFT TISSUES AND SKULL: No acute soft tissue abnormality. No skull fracture. IMPRESSION: 1. No acute intracranial abnormality. Electronically signed by: Morgane Naveau MD 05/03/2024 05:51 PM EST RP Workstation: [id number]   DG Chest 1 View Result Date: 05/03/2024 EXAM: 1 VIEW(S) XRAY OF THE CHEST 05/03/2024 12:52:46 PM COMPARISON: 09/11/2023 CLINICAL HISTORY: [id number] Pain [id number] Pain FINDINGS: LUNGS AND PLEURA: Minimal left infrahilar opacity is noted concerning for atelectasis or possible pneumonia. No pleural effusion. No pneumothorax. HEART AND MEDIASTINUM: No acute abnormality of the cardiac and mediastinal silhouettes. BONES AND SOFT TISSUES: No acute osseous abnormality. IMPRESSION: 1. Minimal left infrahilar opacity, compatible with  atelectasis versus pneumonia. Electronically signed by: Lynwood Seip MD 05/03/2024 01:20 PM EST RP Workstation: [id number]   DG Foot Complete Left Result Date: 04/15/2024 EXAM: 3 OR MORE VIEW(S) XRAY OF THE LEFT FOOT 04/15/2024 07:26:16 AM COMPARISON: None available. CLINICAL HISTORY: pain pain FINDINGS: BONES AND JOINTS: There is no evidence of fracture or dislocation. No focal pathologic bone lesion is seen. There is mild hallux valgus with otherwise normal interosseous alignment. There is a small noninflammatory dorsal calcaneal spur at the achilles tendon insertion. SOFT TISSUES: There is mild soft tissue fullness in the midfoot and forefoot. No radiopaque foreign body or soft tissue gas is seen. IMPRESSION: 1. No acute fracture or dislocation. 2. Mild hallux valgus. 3. Small dorsal calcaneal spur at the Achilles tendon insertion. 4. Mild soft tissue fullness in the midfoot and forefoot. Electronically signed by: Francis Quam MD 04/15/2024 07:55 AM EDT RP Workstation: [id number]    Microbiology: Results for orders placed or performed during the hospital encounter of 05/03/24  Resp panel by RT-PCR (RSV, Flu A&B, Covid) Anterior Nasal Swab     Status: None   Collection Time: 05/03/24  2:37 PM   Specimen: Anterior Nasal Swab  Result Value Ref Range Status   SARS Coronavirus 2 by RT PCR NEGATIVE NEGATIVE Final   Influenza A by PCR NEGATIVE NEGATIVE Final   Influenza B by PCR NEGATIVE NEGATIVE Final    Comment: (NOTE) The Xpert Xpress SARS-CoV-2/FLU/RSV plus assay is intended as an aid in the diagnosis of influenza from Nasopharyngeal swab specimens and should not be used as a sole basis for treatment. Nasal washings and aspirates are unacceptable for Xpert Xpress SARS-CoV-2/FLU/RSV testing.  Fact Sheet for Patients: bloggercourse.com  Fact Sheet for Healthcare Providers: seriousbroker.it  This test is not yet approved or cleared by the  [place]  FDA and has been authorized for detection and/or diagnosis of SARS-CoV-2 by FDA under an Emergency Use Authorization (EUA). This EUA will remain in effect (meaning this test can be used) for the duration of the COVID-19 declaration under Section 564(b)(1) of the Act, 21 U.S.C. section 360bbb-3(b)(1), unless the authorization is terminated or revoked.     Resp Syncytial Virus by PCR NEGATIVE NEGATIVE Final    Comment: (NOTE) Fact Sheet for Patients: bloggercourse.com  Fact Sheet for Healthcare Providers: seriousbroker.it  This test is not yet approved or cleared by the [place]  FDA and has been authorized for detection and/or diagnosis of SARS-CoV-2 by FDA under an Emergency Use Authorization (EUA). This EUA will remain in effect (meaning this test can be used) for the duration of the COVID-19 declaration under Section 564(b)(1) of the Act,  21 U.S.C. section 360bbb-3(b)(1), unless the authorization is terminated or revoked.  Performed at [facility], [address], [place], [place] [address]   Blood Culture (routine x 2)     Status: None (Preliminary result)   Collection Time: 05/03/24  3:12 PM   Specimen: BLOOD RIGHT ARM  Result Value Ref Range Status   Specimen Description BLOOD RIGHT ARM  Final   Special Requests   Final    BOTTLES DRAWN AEROBIC AND ANAEROBIC Blood Culture adequate volume   Culture   Final    NO GROWTH 4 DAYS Performed at [facility], [address], [place], [place] [address]    Report Status PENDING  Incomplete  Blood Culture (routine x 2)     Status: Abnormal   Collection Time: 05/03/24  3:15 PM   Specimen: BLOOD LEFT ARM  Result Value Ref Range Status   Specimen Description BLOOD LEFT ARM  Final   Special Requests   Final    BOTTLES DRAWN AEROBIC AND ANAEROBIC Blood Culture results may not be optimal due to an inadequate volume of blood received in culture bottles    Culture  Setup Time   Final    GRAM POSITIVE COCCI IN CHAINS IN BOTH AEROBIC AND ANAEROBIC BOTTLES CRITICAL RESULT CALLED TO, READ BACK BY AND VERIFIED WITH: PHARMD J.FRENS AT 9076 ON 05/04/2024 BY T.SAAD. Performed at [facility], [address], [place], [place] [address]    Culture STREPTOCOCCUS MITIS/ORALIS (A)  Final   Report Status 05/06/2024 FINAL  Final   Organism ID, Bacteria STREPTOCOCCUS MITIS/ORALIS  Final      Susceptibility   Streptococcus mitis/oralis - MIC*    PENICILLIN  <=0.06 SENSITIVE Sensitive     CEFTRIAXONE  <=0.12 SENSITIVE Sensitive     LEVOFLOXACIN >=16 RESISTANT Resistant     VANCOMYCIN  0.5 SENSITIVE Sensitive     * STREPTOCOCCUS MITIS/ORALIS  Blood Culture ID Panel (Reflexed)     Status: Abnormal   Collection Time: 05/03/24  3:15 PM  Result Value Ref Range Status   Enterococcus faecalis NOT DETECTED NOT DETECTED Final   Enterococcus Faecium NOT DETECTED NOT DETECTED Final   Listeria monocytogenes NOT DETECTED NOT DETECTED Final   Staphylococcus species NOT DETECTED NOT DETECTED Final   Staphylococcus aureus (BCID) NOT DETECTED NOT DETECTED Final   Staphylococcus epidermidis NOT DETECTED NOT DETECTED Final   Staphylococcus lugdunensis NOT DETECTED NOT DETECTED Final   Streptococcus species DETECTED (A) NOT DETECTED Final    Comment: Not Enterococcus species, Streptococcus agalactiae, Streptococcus pyogenes, or Streptococcus pneumoniae. CRITICAL RESULT CALLED TO, READ BACK BY AND VERIFIED WITH: PHARMD J.FRENS AT 9076 ON 05/04/2024 BY T.SAAD.    Streptococcus agalactiae NOT DETECTED NOT DETECTED Final   Streptococcus pneumoniae NOT DETECTED NOT DETECTED Final   Streptococcus pyogenes NOT DETECTED NOT DETECTED Final   A.calcoaceticus-baumannii NOT DETECTED NOT DETECTED Final   Bacteroides fragilis NOT DETECTED NOT DETECTED Final   Enterobacterales NOT DETECTED NOT DETECTED Final   Enterobacter cloacae complex NOT DETECTED NOT DETECTED Final    Escherichia coli NOT DETECTED NOT DETECTED Final   Klebsiella aerogenes NOT DETECTED NOT DETECTED Final   Klebsiella oxytoca NOT DETECTED NOT DETECTED Final   Klebsiella pneumoniae NOT DETECTED NOT DETECTED Final   Proteus species NOT DETECTED NOT DETECTED Final   Salmonella species NOT DETECTED NOT DETECTED Final   Serratia marcescens NOT DETECTED NOT DETECTED Final   Haemophilus influenzae NOT DETECTED NOT DETECTED Final   Neisseria meningitidis NOT DETECTED  NOT DETECTED Final   Pseudomonas aeruginosa NOT DETECTED NOT DETECTED Final   Stenotrophomonas maltophilia NOT DETECTED NOT DETECTED Final   Candida albicans NOT DETECTED NOT DETECTED Final   Candida auris NOT DETECTED NOT DETECTED Final   Candida glabrata NOT DETECTED NOT DETECTED Final   Candida krusei NOT DETECTED NOT DETECTED Final   Candida parapsilosis NOT DETECTED NOT DETECTED Final   Candida tropicalis NOT DETECTED NOT DETECTED Final   Cryptococcus neoformans/gattii NOT DETECTED NOT DETECTED Final    Comment: Performed at [facility], [address], [place], [place] [address]  MRSA culture     Status: None   Collection Time: 05/03/24  8:41 PM   Specimen: Nose; Body Fluid  Result Value Ref Range Status   Specimen Description NOSE  Final   Special Requests NONE  Final   Culture   Final    NO MRSA DETECTED Performed at [facility], [address], [place], [place] [address]    Report Status 05/05/2024 FINAL  Final  Culture, blood (Routine X 2) w Reflex to ID Panel     Status: None (Preliminary result)   Collection Time: 05/06/24  5:12 AM   Specimen: BLOOD LEFT HAND  Result Value Ref Range Status   Specimen Description BLOOD LEFT HAND  Final   Special Requests   Final    BOTTLES DRAWN AEROBIC AND ANAEROBIC Blood Culture adequate volume   Culture   Final    NO GROWTH 1 DAY Performed at [facility], [address], [place], [place] [address]    Report Status PENDING  Incomplete  Culture, blood  (Routine X 2) w Reflex to ID Panel     Status: None (Preliminary result)   Collection Time: 05/06/24  5:12 AM   Specimen: BLOOD LEFT ARM  Result Value Ref Range Status   Specimen Description BLOOD LEFT ARM  Final   Special Requests   Final    BOTTLES DRAWN AEROBIC AND ANAEROBIC Blood Culture adequate volume   Culture   Final    NO GROWTH 1 DAY Performed at [facility], [address], [place], [place] [address]    Report Status PENDING  Incomplete    Labs: CBC: Recent Labs  Lab 05/03/24 1239 05/03/24 1246 05/03/24 2045 05/04/24 0217 05/06/24 0514  WBC 11.5*  --  12.6* 12.2* 7.8  NEUTROABS 8.6*  --   --   --  5.4  HGB 13.6 15.3  15.3 11.7* 12.3* 12.9*  HCT 43.9 45.0  45.0 37.3* 38.9* 39.0  MCV 83.3  --  82.2 81.4 78.9*  PLT 353  --  290 293 349   Basic Metabolic Panel: Recent Labs  Lab 05/03/24 1239 05/03/24 1246 05/03/24 2045 05/04/24 0217 05/05/24 0339 05/06/24 0514 05/07/24 0441  NA 135 137  137  --  135 131* 132* 134*  K 4.4 4.5  4.5  --  3.5 3.7 3.7 3.8  CL 100 99  --  102 99 99 101  CO2 23  --   --  23 22 22 22   GLUCOSE 57* 55*  --  100* 102* 93 109*  BUN 7 9  --  9 9 9 18   CREATININE 0.97 0.90 0.84 1.07 0.72 0.71 0.92  CALCIUM 8.3*  --   --  7.9* 8.2* 8.4* 8.8*   Liver Function Tests: Recent Labs  Lab 05/03/24 1239 05/04/24 0217  AST 45* 29  ALT 41 27  ALKPHOS 73 55  BILITOT 0.6 0.8  PROT 9.7* 7.1  ALBUMIN 3.3* 2.3*   CBG: Recent Labs  Lab 05/03/24 1554 05/03/24 1741 05/06/24 1040  GLUCAP 86 141* 94    Discharge time spent: 40 minutes.  Signed: Deliliah Room, MD [facility] 05/07/2024

## 2024-05-09 LAB — T.PALLIDUM AB, TOTAL: T Pallidum Abs: REACTIVE — AB

## 2024-05-09 LAB — CULTURE, BLOOD (ROUTINE X 2)
Culture: NO GROWTH
Special Requests: ADEQUATE

## 2024-05-11 ENCOUNTER — Other Ambulatory Visit (HOSPITAL_COMMUNITY): Payer: Self-pay

## 2024-05-11 LAB — CULTURE, BLOOD (ROUTINE X 2)
Culture: NO GROWTH
Culture: NO GROWTH
Special Requests: ADEQUATE
Special Requests: ADEQUATE

## 2024-05-13 ENCOUNTER — Other Ambulatory Visit: Payer: Self-pay

## 2024-05-13 ENCOUNTER — Emergency Department (HOSPITAL_COMMUNITY)
Admission: EM | Admit: 2024-05-13 | Discharge: 2024-05-13 | Disposition: A | Discharge status: Home / Self Care | Attending: Emergency Medicine | Admitting: Emergency Medicine

## 2024-05-13 ENCOUNTER — Encounter (HOSPITAL_COMMUNITY): Payer: Self-pay

## 2024-05-13 DIAGNOSIS — Z21 Asymptomatic human immunodeficiency virus [HIV] infection status: Secondary | ICD-10-CM | POA: Diagnosis not present

## 2024-05-13 DIAGNOSIS — Z5900 Homelessness unspecified: Secondary | ICD-10-CM | POA: Insufficient documentation

## 2024-05-13 DIAGNOSIS — X31XXXA Exposure to excessive natural cold, initial encounter: Secondary | ICD-10-CM | POA: Insufficient documentation

## 2024-05-13 DIAGNOSIS — T699XXA Effect of reduced temperature, unspecified, initial encounter: Secondary | ICD-10-CM | POA: Diagnosis present

## 2024-05-13 DIAGNOSIS — X398XXA Other exposure to forces of nature, initial encounter: Secondary | ICD-10-CM

## 2024-05-13 NOTE — ED Notes (Signed)
 Pt wrapped in warm blankets and provided hot pack.

## 2024-05-13 NOTE — ED Provider Notes (Signed)
 [place] EMERGENCY DEPARTMENT AT [facility] Provider Note   CSN: [id number] Arrival date & time: 05/13/24  9567     Patient presents with: Cold Exposure   Blake Chambers is a 33 y.o. male.   33 year old male presents to the emergency department for evaluation of cold exposure.  EMS brought patient from home that he was visiting which did not have heat.  Reports being sick with pneumonia.  It appears he was diagnosed with this 10 days ago when he was evaluated in the emergency department; subsequent admission for sepsis.  Denies shortness of breath at this time.  No associated fevers or additional pain complaints.  The patient is homeless.  The history is provided by the patient. No language interpreter was used.       Prior to Admission medications   Medication Sig Start Date End Date Taking? Authorizing Provider  acetaminophen  (TYLENOL ) 500 MG tablet Take 1 tablet (500 mg total) by mouth every 6 (six) hours as needed. Patient not taking: Reported on 05/04/2024 04/15/24   Silver Fell A, PA  bictegravir-emtricitabine -tenofovir  AF (BIKTARVY ) 50-200-25 MG TABS tablet Take 1 tablet by mouth daily. 05/07/24   Rashid, Farhan, MD  cefadroxil  (DURICEF) 500 MG capsule Take 2 capsules (1,000 mg total) by mouth 2 (two) times daily. 05/07/24   Rashid, Farhan, MD  clotrimazole  (CLOTRIMAZOLE  AF) 1 % cream Apply 1 Application topically 2 (two) times daily. Patient not taking: Reported on 01/10/2024 11/27/23   Calone, Gregory D, FNP  nicotine  (NICODERM CQ  - DOSED IN MG/24 HOURS) 21 mg/24hr patch Place 1 patch (21 mg total) onto the skin daily. 05/08/24   Rashid, Farhan, MD  ondansetron  (ZOFRAN -ODT) 8 MG disintegrating tablet Take 1 tablet (8 mg total) by mouth every 8 (eight) hours as needed for nausea Patient not taking: Reported on 01/10/2024 05/23/23   Palumbo, April, MD  pantoprazole  (PROTONIX ) 40 MG tablet Take 1 tablet (40 mg total) by mouth daily. Patient not taking: Reported on  05/04/2024 09/11/23   Haze Lonni PARAS, MD  valACYclovir  (VALTREX ) 1000 MG tablet Take 1 tablet (1,000 mg total) by mouth 2 (two) times daily. Patient not taking: Reported on 01/10/2024 11/27/23   Calone, Gregory D, FNP    Allergies: Blueberry flavoring agent (non-screening), Pineapple, Tenofovir  disoproxil, and Kiwi extract    Review of Systems Ten systems reviewed and are negative for acute change, except as noted in the HPI.    Updated Vital Signs BP 123/87   Pulse 89   Temp 97.8 F (36.6 C) (Oral)   Resp 16   Ht 5' 9 (1.753 m)   Wt 68 kg   SpO2 98%   BMI 22.14 kg/m   Physical Exam Vitals and nursing note reviewed.  Constitutional:      General: He is not in acute distress.    Appearance: He is well-developed. He is not diaphoretic.     Comments: Disheveled. Sleepy. Appears at known baseline. Smells like bacon.  HENT:     Head: Normocephalic and atraumatic.  Eyes:     General: No scleral icterus.    Conjunctiva/sclera: Conjunctivae normal.  Cardiovascular:     Rate and Rhythm: Normal rate and regular rhythm.     Pulses: Normal pulses.  Pulmonary:     Effort: Pulmonary effort is normal. No respiratory distress.     Comments: Respirations even and unlabored Musculoskeletal:        General: Normal range of motion.     Cervical back: Normal range of  motion.  Skin:    General: Skin is warm and dry.     Coloration: Skin is not pale.     Findings: No erythema or rash.  Neurological:     Mental Status: He is alert and oriented to person, place, and time.     Coordination: Coordination normal.  Psychiatric:        Behavior: Behavior normal.     (all labs ordered are listed, but only abnormal results are displayed) Labs Reviewed - No data to display  EKG: None  Radiology: No results found.   Procedures   Medications Ordered in the ED - No data to display                                  Medical Decision Making  This patient presents to the ED for  concern of cold exposure, this involves an extensive number of treatment options, and is a complaint that carries with it a high risk of complications and morbidity.  The differential diagnosis includes hypothermia vs frostbite vs viral illness   Co morbidities that complicate the patient evaluation  HIV Bipolar affective Anxiety   Additional history obtained:  Additional history obtained from EMS personnel External records from outside source obtained and reviewed including CT chest from admission 05/03/24   Cardiac Monitoring:  The patient was maintained on a cardiac monitor.  I personally viewed and interpreted the cardiac monitored which showed an underlying rhythm of: NSR   Medicines ordered and prescription drug management:  I have reviewed the patients home medicines and have made adjustments as needed   Problem List / ED Course:  34 year old male presenting to the emergency department for cold exposure.  EMS reported temperature of 96.8 F.  This improved during transport as patient had temperature of 97.8 F on arrival. Does have a history of pneumonia, diagnosed on 05/03/2024.  He was discharged with a course of antibiotics and reports taking this.  He does not have any fever today or other SIRS criteria to suggest concern for sepsis.  Also no hypoxemia. The patient has no additional complaints.  Denies pain.  He appears at his known baseline and is stable to follow-up with his primary care doctor for any ongoing complaints.  Did not feel further emergent workup is presently indicated.   Reevaluation:  After the interventions noted above, I reevaluated the patient and found that they have :stayed the same   Social Determinants of Health:  Housing instability   Dispostion:  After consideration of the diagnostic results and the patients response to treatment, I feel that the patent would benefit from PCP f/u for reassessment. Return precautions discussed and  provided. Patient discharged in stable condition with no unaddressed concerns.       Final diagnoses:  Exposure to weather condition, initial encounter    ED Discharge Orders     None          Keith Sor, PA-C 05/13/24 0551    Trine Raynell Moder, MD 05/13/24 [phone]

## 2024-05-13 NOTE — ED Triage Notes (Signed)
 Pt bib ems from a house that he was visiting that doesn't have heat. Pt is shivering and cold. Pt states he has been sick with PNA. Pt endorses taking medicine. Pt denies sob. Pt states he is homeless.   Pt states extremities are cramping.   T 96.8 BP 148/98 HR 100

## 2024-05-26 ENCOUNTER — Ambulatory Visit: Payer: Self-pay | Admitting: Internal Medicine

## 2024-06-05 ENCOUNTER — Emergency Department (HOSPITAL_COMMUNITY): Admission: EM | Admit: 2024-06-05 | Discharge: 2024-06-05 | Disposition: A | Discharge status: Home / Self Care

## 2024-06-05 ENCOUNTER — Encounter (HOSPITAL_COMMUNITY): Payer: Self-pay | Admitting: *Deleted

## 2024-06-05 ENCOUNTER — Other Ambulatory Visit: Payer: Self-pay

## 2024-06-05 DIAGNOSIS — S60512A Abrasion of left hand, initial encounter: Secondary | ICD-10-CM | POA: Insufficient documentation

## 2024-06-05 DIAGNOSIS — X58XXXA Exposure to other specified factors, initial encounter: Secondary | ICD-10-CM | POA: Insufficient documentation

## 2024-06-05 DIAGNOSIS — M79641 Pain in right hand: Secondary | ICD-10-CM | POA: Diagnosis present

## 2024-06-05 DIAGNOSIS — S60511A Abrasion of right hand, initial encounter: Secondary | ICD-10-CM | POA: Diagnosis not present

## 2024-06-05 LAB — BASIC METABOLIC PANEL WITH GFR
Anion gap: 8 (ref 5–15)
BUN: 16 mg/dL (ref 6–20)
CO2: 25 mmol/L (ref 22–32)
Calcium: 8.6 mg/dL — ABNORMAL LOW (ref 8.9–10.3)
Chloride: 101 mmol/L (ref 98–111)
Creatinine, Ser: 1.03 mg/dL (ref 0.61–1.24)
GFR, Estimated: >60 mL/min
Glucose, Bld: 138 mg/dL — ABNORMAL HIGH (ref 70–99)
Potassium: 3.8 mmol/L (ref 3.5–5.1)
Sodium: 135 mmol/L (ref 135–145)

## 2024-06-05 LAB — CBC WITH DIFFERENTIAL/PLATELET
Abs Immature Granulocytes: 0.04 K/uL (ref 0.00–0.07)
Basophils Absolute: 0 K/uL (ref 0.0–0.1)
Basophils Relative: 1 %
Eosinophils Absolute: 0.1 K/uL (ref 0.0–0.5)
Eosinophils Relative: 1 %
HCT: 39.6 % (ref 39.0–52.0)
Hemoglobin: 12.8 g/dL — ABNORMAL LOW (ref 13.0–17.0)
Immature Granulocytes: 1 %
Lymphocytes Relative: 25 %
Lymphs Abs: 2 K/uL (ref 0.7–4.0)
MCH: 25.9 pg — ABNORMAL LOW (ref 26.0–34.0)
MCHC: 32.3 g/dL (ref 30.0–36.0)
MCV: 80.2 fL (ref 80.0–100.0)
Monocytes Absolute: 0.5 K/uL (ref 0.1–1.0)
Monocytes Relative: 6 %
Neutro Abs: 5.6 K/uL (ref 1.7–7.7)
Neutrophils Relative %: 66 %
Platelets: 283 K/uL (ref 150–400)
RBC: 4.94 MIL/uL (ref 4.22–5.81)
RDW: 15.9 % — ABNORMAL HIGH (ref 11.5–15.5)
WBC: 8.3 K/uL (ref 4.0–10.5)
nRBC: 0 % (ref 0.0–0.2)

## 2024-06-05 MED ORDER — ACETAMINOPHEN 500 MG PO TABS: 1000.0000 mg | ORAL_TABLET | Freq: Once | ORAL | Status: DC

## 2024-06-05 NOTE — ED Notes (Signed)
 Pt provided bus pass and pt decline Tylenol  at this time. Pt was informed recommendations to treat sx with Tylenol  and Ibuprofen  alternating. Pt verbalized understanding.

## 2024-06-05 NOTE — ED Notes (Signed)
 Pt refuses to get into stretcher, left pt in wheelchair

## 2024-06-05 NOTE — ED Triage Notes (Signed)
 Patient presents to ed via [facility], states he is homeless and has been outside c/o pain and blisters to all his fingers , states he lives outside.

## 2024-06-05 NOTE — ED Provider Notes (Signed)
 " [place] EMERGENCY DEPARTMENT AT [facility] Provider Note   CSN: [id number] Arrival date & time: 06/05/24  9691     Patient presents with: Hand Pain  HPI Blake Chambers is a 33 y.o. male presenting for pain in both hands.  Denies trauma but states he has blisters to his fingers.  Reports that he is homeless.  Denies any weakness or numbness in his hands.    Hand Pain       Prior to Admission medications  Medication Sig Start Date End Date Taking? Authorizing Provider  acetaminophen  (TYLENOL ) 500 MG tablet Take 1 tablet (500 mg total) by mouth every 6 (six) hours as needed. Patient not taking: Reported on 05/04/2024 04/15/24   Silver Fell A, PA  bictegravir-emtricitabine -tenofovir  AF (BIKTARVY ) 50-200-25 MG TABS tablet Take 1 tablet by mouth daily. 05/07/24   Dino Antu, MD  cefadroxil  (DURICEF) 500 MG capsule Take 2 capsules (1,000 mg total) by mouth 2 (two) times daily. 05/07/24   Rashid, Farhan, MD  clotrimazole  (CLOTRIMAZOLE  AF) 1 % cream Apply 1 Application topically 2 (two) times daily. Patient not taking: Reported on 01/10/2024 11/27/23   Calone, Gregory D, FNP  nicotine  (NICODERM CQ  - DOSED IN MG/24 HOURS) 21 mg/24hr patch Place 1 patch (21 mg total) onto the skin daily. 05/08/24   Rashid, Farhan, MD  ondansetron  (ZOFRAN -ODT) 8 MG disintegrating tablet Take 1 tablet (8 mg total) by mouth every 8 (eight) hours as needed for nausea Patient not taking: Reported on 01/10/2024 05/23/23   Palumbo, April, MD  pantoprazole  (PROTONIX ) 40 MG tablet Take 1 tablet (40 mg total) by mouth daily. Patient not taking: Reported on 05/04/2024 09/11/23   Haze Lonni PARAS, MD  valACYclovir  (VALTREX ) 1000 MG tablet Take 1 tablet (1,000 mg total) by mouth 2 (two) times daily. Patient not taking: Reported on 01/10/2024 11/27/23   Calone, Gregory D, FNP    Allergies: Blueberry flavoring agent (non-screening), Pineapple, Tenofovir  disoproxil, and Kiwi extract    Review of  Systems See HPI  Updated Vital Signs BP (!) 143/80 (BP Location: Right Arm)   Pulse (!) 107   Temp 98.3 F (36.8 C)   Resp 18   Ht 5' 7 (1.702 m)   Wt 63.5 kg   SpO2 100%   BMI 21.93 kg/m   Physical Exam Constitutional:      Appearance: Normal appearance.  HENT:     Head: Normocephalic.     Nose: Nose normal.  Eyes:     Conjunctiva/sclera: Conjunctivae normal.  Pulmonary:     Effort: Pulmonary effort is normal.  Musculoskeletal:     Right hand: No swelling, deformity or tenderness. Normal range of motion. Normal pulse.     Left hand: No swelling, deformity or tenderness. Normal range of motion. Normal pulse.     Comments: Various superficial abrasions on both hands but no erythema edema or oozing or bleeding.  Neurological:     Mental Status: He is alert.  Psychiatric:        Mood and Affect: Mood normal.     (all labs ordered are listed, but only abnormal results are displayed) Labs Reviewed  BASIC METABOLIC PANEL WITH GFR - Abnormal; Notable for the following components:      Result Value   Glucose, Bld 138 (*)    Calcium 8.6 (*)    All other components within normal limits  CBC WITH DIFFERENTIAL/PLATELET - Abnormal; Notable for the following components:   Hemoglobin 12.8 (*)  MCH 25.9 (*)    RDW 15.9 (*)    All other components within normal limits    EKG: None  Radiology: No results found.   Procedures   Medications Ordered in the ED  acetaminophen  (TYLENOL ) tablet 1,000 mg (has no administration in time range)                                    Medical Decision Making  33 yo well appearing male presenting for bilateral hand pain.  Exam was unremarkable.  Both hands are neurovascularly intact and no evidence of significant trauma.  Patient also asking for something to eat and drink.  Give him a soda and sandwich.  Treated with Tylenol  and advised him to follow-up with his PCP.  Advised supportive care at home.  Discharged.     Final  diagnoses:  Bilateral hand pain    ED Discharge Orders     None          Lang Norleen POUR, PA-C 06/05/24 9191  "

## 2024-06-05 NOTE — ED Provider Triage Note (Signed)
 Emergency Medicine Provider Triage Evaluation Note  Blake Chambers , a 32 y.o. male  was evaluated in triage.  Pt complains of hand pain with some blistering.Patient homeless with history of HIV and syphilis  Review of Systems  Positive:  Negative:   Physical Exam  BP (!) 143/80 (BP Location: Right Arm)   Pulse (!) 107   Temp 98.3 F (36.8 C)   Resp 18   Ht 5' 7 (1.702 m)   Wt 63.5 kg   SpO2 100%   BMI 21.93 kg/m  Gen:   Awake, no distress   Resp:  Normal effort  MSK:   Moves extremities without difficulty  Other:    Medical Decision Making  Medically screening exam initiated at 3:42 AM.  Appropriate orders placed.  Blake Chambers was informed that the remainder of the evaluation will be completed by another provider, this initial triage assessment does not replace that evaluation, and the importance of remaining in the ED until their evaluation is complete.     Blake Chambers, [place] 06/05/24 [phone]

## 2024-06-05 NOTE — Discharge Instructions (Addendum)
 Evaluation of your hand pain is overall reassuring.  Recommend applying ice in areas that hurt and you can take Tylenol  and ibuprofen  as well.  Please follow-up with your PCP.

## 2024-06-19 ENCOUNTER — Other Ambulatory Visit: Payer: Self-pay

## 2024-06-19 ENCOUNTER — Emergency Department (HOSPITAL_COMMUNITY)
Admission: EM | Admit: 2024-06-19 | Discharge: 2024-06-19 | Discharge status: Against Medical Advice | Source: Home / Self Care

## 2024-06-19 ENCOUNTER — Encounter (HOSPITAL_COMMUNITY): Payer: Self-pay | Admitting: Pharmacy Technician

## 2024-06-19 DIAGNOSIS — Z5321 Procedure and treatment not carried out due to patient leaving prior to being seen by health care provider: Secondary | ICD-10-CM | POA: Insufficient documentation

## 2024-06-19 DIAGNOSIS — M79641 Pain in right hand: Secondary | ICD-10-CM | POA: Insufficient documentation

## 2024-06-19 DIAGNOSIS — M79642 Pain in left hand: Secondary | ICD-10-CM | POA: Diagnosis not present

## 2024-06-19 NOTE — ED Triage Notes (Signed)
 Pt bib ems with pain in bil hands, chronic in nature. Today pt reports walking for the last 2 days, poor oral intake, feeling exhausted. VSS with ems.

## 2024-06-27 ENCOUNTER — Emergency Department (HOSPITAL_COMMUNITY)
Admission: EM | Admit: 2024-06-27 | Discharge: 2024-06-27 | Disposition: A | Discharge status: Home / Self Care | Attending: Emergency Medicine | Admitting: Emergency Medicine

## 2024-06-27 ENCOUNTER — Encounter (HOSPITAL_COMMUNITY): Payer: Self-pay

## 2024-06-27 ENCOUNTER — Other Ambulatory Visit: Payer: Self-pay

## 2024-06-27 DIAGNOSIS — R200 Anesthesia of skin: Secondary | ICD-10-CM | POA: Insufficient documentation

## 2024-06-27 DIAGNOSIS — Z21 Asymptomatic human immunodeficiency virus [HIV] infection status: Secondary | ICD-10-CM | POA: Diagnosis not present

## 2024-06-27 DIAGNOSIS — R739 Hyperglycemia, unspecified: Secondary | ICD-10-CM | POA: Insufficient documentation

## 2024-06-27 DIAGNOSIS — R531 Weakness: Secondary | ICD-10-CM | POA: Diagnosis not present

## 2024-06-27 LAB — URINALYSIS, ROUTINE W REFLEX MICROSCOPIC
Bacteria, UA: NONE SEEN
Bilirubin Urine: NEGATIVE
Glucose, UA: NEGATIVE mg/dL
Hgb urine dipstick: NEGATIVE
Ketones, ur: NEGATIVE mg/dL
Leukocytes,Ua: NEGATIVE
Nitrite: NEGATIVE
Protein, ur: 30 mg/dL — AB
Specific Gravity, Urine: 1.028 (ref 1.005–1.030)
pH: 5 (ref 5.0–8.0)

## 2024-06-27 LAB — COMPREHENSIVE METABOLIC PANEL WITH GFR
ALT: 36 U/L (ref 0–44)
AST: 41 U/L (ref 15–41)
Albumin: 3.6 g/dL (ref 3.5–5.0)
Alkaline Phosphatase: 94 U/L (ref 38–126)
Anion gap: 9 (ref 5–15)
BUN: 9 mg/dL (ref 6–20)
CO2: 25 mmol/L (ref 22–32)
Calcium: 8.7 mg/dL — ABNORMAL LOW (ref 8.9–10.3)
Chloride: 102 mmol/L (ref 98–111)
Creatinine, Ser: 0.87 mg/dL (ref 0.61–1.24)
GFR, Estimated: >60 mL/min
Glucose, Bld: 114 mg/dL — ABNORMAL HIGH (ref 70–99)
Potassium: 3.8 mmol/L (ref 3.5–5.1)
Sodium: 137 mmol/L (ref 135–145)
Total Bilirubin: 0.6 mg/dL (ref 0.0–1.2)
Total Protein: 8.6 g/dL — ABNORMAL HIGH (ref 6.5–8.1)

## 2024-06-27 LAB — CBC WITH DIFFERENTIAL/PLATELET
Abs Immature Granulocytes: 0.02 K/uL (ref 0.00–0.07)
Basophils Absolute: 0 K/uL (ref 0.0–0.1)
Basophils Relative: 1 %
Eosinophils Absolute: 0.2 K/uL (ref 0.0–0.5)
Eosinophils Relative: 3 %
HCT: 42.6 % (ref 39.0–52.0)
Hemoglobin: 13.7 g/dL (ref 13.0–17.0)
Immature Granulocytes: 0 %
Lymphocytes Relative: 29 %
Lymphs Abs: 1.6 K/uL (ref 0.7–4.0)
MCH: 26.2 pg (ref 26.0–34.0)
MCHC: 32.2 g/dL (ref 30.0–36.0)
MCV: 81.5 fL (ref 80.0–100.0)
Monocytes Absolute: 0.5 K/uL (ref 0.1–1.0)
Monocytes Relative: 10 %
Neutro Abs: 3.1 K/uL (ref 1.7–7.7)
Neutrophils Relative %: 57 %
Platelets: 318 K/uL (ref 150–400)
RBC: 5.23 MIL/uL (ref 4.22–5.81)
RDW: 15.9 % — ABNORMAL HIGH (ref 11.5–15.5)
WBC: 5.4 K/uL (ref 4.0–10.5)
nRBC: 0 % (ref 0.0–0.2)

## 2024-06-27 LAB — URINE DRUG SCREEN
Amphetamines: POSITIVE — AB
Barbiturates: NEGATIVE
Benzodiazepines: NEGATIVE
Cocaine: NEGATIVE
Fentanyl: NEGATIVE
Methadone Scn, Ur: NEGATIVE
Opiates: NEGATIVE
Tetrahydrocannabinol: NEGATIVE

## 2024-06-27 LAB — CK: Total CK: 266 U/L (ref 49–397)

## 2024-06-27 LAB — MAGNESIUM: Magnesium: 2 mg/dL (ref 1.7–2.4)

## 2024-06-27 LAB — CBG MONITORING, ED: Glucose-Capillary: 127 mg/dL — ABNORMAL HIGH (ref 70–99)

## 2024-06-27 MED ORDER — LACTATED RINGERS IV BOLUS
1000.0000 mL | Freq: Once | INTRAVENOUS | Status: AC
  Administered 2024-06-27: 1000 mL via INTRAVENOUS

## 2024-06-27 NOTE — ED Triage Notes (Addendum)
 Patient BIB [facility]. Has bilateral arm and leg pain. Used a blanket last night and he thinks there was something on it that caused the pain. Pain started 1 hour ago.

## 2024-06-27 NOTE — ED Provider Notes (Signed)
 " [place] EMERGENCY DEPARTMENT AT [facility] Provider Note   CSN: [id number] Arrival date & time: 06/27/24  9267     Patient presents with: Pain All Over   Blake Chambers is a 33 y.o. male.   HPI 34 year old male presents with leg weakness and abnormal sensation.  He is describing what sounds like tingling to both of his legs from his mid thighs down.  Seem to start suddenly this morning.  He feels like his legs are too weak and this seemingly came out of nowhere.  He denies any back or abdominal pain.  No headache or upper extremity symptoms.  There is no pain despite the initial chief complaints.  He states he cannot walk due to the symptoms.  However the triage nurse noted that he was wheeled back to the room and then stood out of the wheelchair and sat into the chair.  Patient has a known history of HIV, bipolar disorder, polysubstance abuse.  He does not take his medications.  Prior to Admission medications  Medication Sig Start Date End Date Taking? Authorizing Provider  acetaminophen  (TYLENOL ) 500 MG tablet Take 1 tablet (500 mg total) by mouth every 6 (six) hours as needed. Patient not taking: Reported on 05/04/2024 04/15/24   Silver Fell A, PA  bictegravir-emtricitabine -tenofovir  AF (BIKTARVY ) 50-200-25 MG TABS tablet Take 1 tablet by mouth daily. 05/07/24   Dino Antu, MD  cefadroxil  (DURICEF) 500 MG capsule Take 2 capsules (1,000 mg total) by mouth 2 (two) times daily. 05/07/24   Rashid, Farhan, MD  clotrimazole  (CLOTRIMAZOLE  AF) 1 % cream Apply 1 Application topically 2 (two) times daily. Patient not taking: Reported on 01/10/2024 11/27/23   Calone, Gregory D, FNP  nicotine  (NICODERM CQ  - DOSED IN MG/24 HOURS) 21 mg/24hr patch Place 1 patch (21 mg total) onto the skin daily. 05/08/24   Rashid, Farhan, MD  ondansetron  (ZOFRAN -ODT) 8 MG disintegrating tablet Take 1 tablet (8 mg total) by mouth every 8 (eight) hours as needed for nausea Patient not taking:  Reported on 01/10/2024 05/23/23   Palumbo, April, MD  pantoprazole  (PROTONIX ) 40 MG tablet Take 1 tablet (40 mg total) by mouth daily. Patient not taking: Reported on 05/04/2024 09/11/23   Haze Lonni PARAS, MD  valACYclovir  (VALTREX ) 1000 MG tablet Take 1 tablet (1,000 mg total) by mouth 2 (two) times daily. Patient not taking: Reported on 01/10/2024 11/27/23   Calone, Gregory D, FNP    Allergies: Blueberry flavoring agent (non-screening), Pineapple, Tenofovir  disoproxil, and Kiwi extract    Review of Systems  Constitutional:  Negative for fever.  Gastrointestinal:  Negative for abdominal pain.  Genitourinary:        No incontinence  Musculoskeletal:  Negative for back pain.  Neurological:  Positive for weakness and numbness.    Updated Vital Signs BP (!) 128/90 (BP Location: Right Arm)   Pulse 95   Temp 98.2 F (36.8 C) (Oral)   Resp 15   Ht 5' 7 (1.702 m)   Wt 64 kg   SpO2 99%   BMI 22.10 kg/m   Physical Exam Vitals and nursing note reviewed.  Constitutional:      Appearance: He is well-developed.  HENT:     Head: Normocephalic and atraumatic.  Cardiovascular:     Rate and Rhythm: Normal rate and regular rhythm.     Pulses:          Dorsalis pedis pulses are 2+ on the right side and 2+ on the left side.  Pulmonary:     Effort: Pulmonary effort is normal.  Abdominal:     Palpations: Abdomen is soft.     Tenderness: There is no abdominal tenderness.  Skin:    General: Skin is warm and dry.  Neurological:     Mental Status: He is alert.     Deep Tendon Reflexes:     Reflex Scores:      Patellar reflexes are 2+ on the right side and 2+ on the left side.      Achilles reflexes are 2+ on the right side and 2+ on the left side.    Comments: 5/5 strength in both upper extremities. Grossly intact sensation in the bilateral legs. Very limited movement in either leg with strength testing.     (all labs ordered are listed, but only abnormal results are  displayed) Labs Reviewed  COMPREHENSIVE METABOLIC PANEL WITH GFR - Abnormal; Notable for the following components:      Result Value   Glucose, Bld 114 (*)    Calcium 8.7 (*)    Total Protein 8.6 (*)    All other components within normal limits  CBC WITH DIFFERENTIAL/PLATELET - Abnormal; Notable for the following components:   RDW 15.9 (*)    All other components within normal limits  URINALYSIS, ROUTINE W REFLEX MICROSCOPIC - Abnormal; Notable for the following components:   Protein, ur 30 (*)    All other components within normal limits  URINE DRUG SCREEN - Abnormal; Notable for the following components:   Amphetamines POSITIVE (*)    All other components within normal limits  CBG MONITORING, ED - Abnormal; Notable for the following components:   Glucose-Capillary 127 (*)    All other components within normal limits  MAGNESIUM   CK    EKG: EKG Interpretation Date/Time:  Monday June 27 2024 11:37:33 EST Ventricular Rate:  100 PR Interval:  117 QRS Duration:  77 QT Interval:  344 QTC Calculation: 444 R Axis:   63  Text Interpretation: Sinus tachycardia Right atrial enlargement ST elev, probable normal early repol pattern no significant change since Nov 2025 Confirmed by Freddi Hamilton [phone]) on 06/27/2024 12:04:56 PM  Radiology: No results found.   Procedures   Medications Ordered in the ED  lactated ringers  bolus 1,000 mL (1,000 mLs Intravenous New Bag/Given 06/27/24 1155)                                    Medical Decision Making Amount and/or Complexity of Data Reviewed External Data Reviewed: notes. Labs: ordered.    Details: Unremarkable electrolytes.  Normal hemoglobin. ECG/medicine tests: ordered and independent interpretation performed.    Details: Sinus rhythm   Patient presents with bilateral leg weakness and a fuzzy feeling.  He is giving poor effort on exam.  Was given fluids and labs were evaluated.  He has strong pulses in both feet and  unremarkable deep tendon reflexes of knee and Achilles.  After fluids/time, he was ambulated and was able to ambulate on his own without assistance from the nursing staff.  When I went back to reevaluate him after this, he is walking around and plugging in his cell phone into the wall.  When he sat back down he was able to sit down and then push off with his feet to scoot back in the chair.  I suspect he does not have an acute neurovascular emergency causing weakness.  Unclear why  he was feeling weak but even now he is saying that the fuzzy feeling is resolved.  My suspicion for an acute spinal cord emergency is very low.  I do not think he needs MRIs, LP, etc.  Will discharge with return precautions.     Final diagnoses:  Weakness    ED Discharge Orders     None          Freddi Hamilton, MD 06/27/24 1325  "

## 2024-06-27 NOTE — Discharge Instructions (Signed)
 If you develop fever, numbness, weakness, back pain, incontinence, or any other new/concerning symptoms then return to the ER.

## 2024-06-27 NOTE — ED Notes (Signed)
 Pt stood from wheelchair to the exam chair with no assistance.

## 2024-07-04 ENCOUNTER — Encounter (HOSPITAL_COMMUNITY): Payer: Self-pay | Admitting: Emergency Medicine

## 2024-07-04 ENCOUNTER — Emergency Department (HOSPITAL_COMMUNITY)
Admission: EM | Admit: 2024-07-04 | Discharge: 2024-07-04 | Disposition: A | Discharge status: Home / Self Care | Payer: MEDICAID | Attending: Emergency Medicine | Admitting: Emergency Medicine

## 2024-07-04 ENCOUNTER — Other Ambulatory Visit: Payer: Self-pay

## 2024-07-04 DIAGNOSIS — R202 Paresthesia of skin: Secondary | ICD-10-CM | POA: Insufficient documentation

## 2024-07-04 DIAGNOSIS — Z21 Asymptomatic human immunodeficiency virus [HIV] infection status: Secondary | ICD-10-CM | POA: Diagnosis not present

## 2024-07-04 DIAGNOSIS — X31XXXA Exposure to excessive natural cold, initial encounter: Secondary | ICD-10-CM | POA: Insufficient documentation

## 2024-07-04 DIAGNOSIS — Z5900 Homelessness unspecified: Secondary | ICD-10-CM | POA: Insufficient documentation

## 2024-07-04 DIAGNOSIS — X398XXA Other exposure to forces of nature, initial encounter: Secondary | ICD-10-CM
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
## 2024-07-04 NOTE — Discharge Instructions (Signed)
Return to the emergency department if you develop any life-threatening symptoms.
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
## 2024-07-04 NOTE — ED Notes (Signed)
 Patient given socks, heat packs, bus pass with AVS, breakfast sandwich, and juice.

## 2024-07-05 ENCOUNTER — Encounter (HOSPITAL_COMMUNITY): Payer: Self-pay

## 2024-07-05 ENCOUNTER — Emergency Department (HOSPITAL_COMMUNITY)
Admission: EM | Admit: 2024-07-05 | Discharge: 2024-07-06 | Disposition: A | Discharge status: Home / Self Care | Payer: MEDICAID | Attending: Emergency Medicine | Admitting: Emergency Medicine

## 2024-07-05 ENCOUNTER — Other Ambulatory Visit: Payer: Self-pay

## 2024-07-05 DIAGNOSIS — M79671 Pain in right foot: Secondary | ICD-10-CM | POA: Diagnosis present

## 2024-07-05 DIAGNOSIS — M79672 Pain in left foot: Secondary | ICD-10-CM | POA: Diagnosis not present

## 2024-07-05 LAB — CBG MONITORING, ED: Glucose-Capillary: 71 mg/dL (ref 70–99)

## 2024-07-05 NOTE — ED Provider Triage Note (Signed)
 Emergency Medicine Provider Triage Evaluation Note  Blake Chambers , a 34 y.o. male  was evaluated in triage.  Pt complains of cold and chronic pain.  Review of Systems  Positive: Feeling cold all over, was lying on the ground today outside.  Reports chronic pain in both legs and both feet which is unchanged from normal. Negative: No fevers, congestion, cough, nausea, vomiting, constipation, diarrhea, or urinary changes.  Physical Exam  BP (!) 134/96 (BP Location: Left Arm)   Pulse (!) 109   Temp 97.9 F (36.6 C) (Oral)   Resp 14   SpO2 99%  Gen:   Awake, no distress   Resp:  Normal effort  MSK:   Moves extremities without difficulty  Other:    Medical Decision Making  Medically screening exam initiated at 10:30 PM.  Appropriate orders placed.  Blake Chambers was informed that the remainder of the evaluation will be completed by another provider, this initial triage assessment does not replace that evaluation, and the importance of remaining in the ED until their evaluation is complete.  Blake Chambers is a 34 y.o. male with a past medical history significant for HIV, previous osteomyelitis, polysubstance abuse history, and previous syphilis who presents for cold exposure.  According to EMS report, he was lying on the ground outside today and called feeling cold.  He denies any new injury or new trauma.  He reports pain in both legs that he has had for a long time.  No new complaints aside from feeling cold.  He specifically denies any fevers, chest pain, shortness of breath, cough, nausea, vomiting, constipation, diarrhea, or urinary changes.  Patient resting with a warm blanket.  He has many layers on but otherwise appears comfortable.  Moving all extremities.  Lungs were clear.  Chest and abdomen nontender.  Patient resting now.  Will let patient get a warm blanket and eat and drink something as this is how he was treated yesterday.  Will check a CBG to make sure it is not significantly  hypoglycemic.  Anticipate when he is seen by provider, they can discuss if he needs further workup however with lack of any complaints aside from feeling cold, will hold on large lab and imaging workup initially.    Lucyann Romano, Lonni PARAS, MD 07/05/24 2233

## 2024-07-05 NOTE — ED Triage Notes (Signed)
 Patient brought in by EMS. [employer] found the patient lying outside on the sidewalk. Patient has been outside all day. Reports leg pain that is chronic.

## 2024-07-05 NOTE — ED Notes (Signed)
 Pt provided sandwich bag and drinks per MD and RN.

## 2024-07-06 LAB — GLUCOSE, POCT (MANUAL RESULT ENTRY): POC Glucose: 63 mg/dL — AB (ref 70–99)

## 2024-07-06 NOTE — ED Provider Notes (Signed)
" °  [place] EMERGENCY DEPARTMENT AT [facility] Provider Note   CSN: [id number] Arrival date & time: 07/05/24  2225     Patient presents with: Cold Exposure and Leg Pain   Blake Chambers is a 34 y.o. male.   The history is provided by the patient.   Patient presents because he is cold He apparently called EMS because he felt cold. He is now reporting that his feet hurt from cuts on the bottom of his feet No recent trauma is reported    Prior to Admission medications  Medication Sig Start Date End Date Taking? Authorizing Provider  acetaminophen  (TYLENOL ) 500 MG tablet Take 1 tablet (500 mg total) by mouth every 6 (six) hours as needed. Patient not taking: Reported on 05/04/2024 04/15/24   Silver Wonda LABOR, PA  bictegravir-emtricitabine -tenofovir  AF (BIKTARVY ) 50-200-25 MG TABS tablet Take 1 tablet by mouth daily. 05/07/24   Dino Antu, MD  cefadroxil  (DURICEF) 500 MG capsule Take 2 capsules (1,000 mg total) by mouth 2 (two) times daily. 05/07/24   Dino Antu, MD  clotrimazole  (CLOTRIMAZOLE  AF) 1 % cream Apply 1 Application topically 2 (two) times daily. Patient not taking: Reported on 01/10/2024 11/27/23   Calone, Gregory D, FNP  nicotine  (NICODERM CQ  - DOSED IN MG/24 HOURS) 21 mg/24hr patch Place 1 patch (21 mg total) onto the skin daily. 05/08/24   Rashid, Farhan, MD  ondansetron  (ZOFRAN -ODT) 8 MG disintegrating tablet Take 1 tablet (8 mg total) by mouth every 8 (eight) hours as needed for nausea Patient not taking: Reported on 01/10/2024 05/23/23   Palumbo, April, MD  pantoprazole  (PROTONIX ) 40 MG tablet Take 1 tablet (40 mg total) by mouth daily. Patient not taking: Reported on 05/04/2024 09/11/23   Haze Lonni PARAS, MD  valACYclovir  (VALTREX ) 1000 MG tablet Take 1 tablet (1,000 mg total) by mouth 2 (two) times daily. Patient not taking: Reported on 01/10/2024 11/27/23   Calone, Gregory D, FNP    Allergies: Blueberry flavoring agent (non-screening),  Pineapple, Tenofovir  disoproxil, and Kiwi extract    Review of Systems  Updated Vital Signs BP (!) 134/96 (BP Location: Left Arm)   Pulse (!) 109   Temp 97.9 F (36.6 C) (Oral)   Resp 14   SpO2 99%   Physical Exam CONSTITUTIONAL: Disheveled, sleeping on arrival to the bedside HEAD: Normocephalic/atraumatic NEURO: Pt is awake/alert/appropriate, moves all extremitiesx4.  No facial droop.   EXTREMITIES: pulses normal/equal, full ROM Distal pulses equal intact in both feet.  The feet are warm to touch. No erythema, no crepitance. No deformities to his lower extremities Chronic toenail fungus noted bilaterally No signs of any active infection SKIN: warm, color normal  (all labs ordered are listed, but only abnormal results are displayed) Labs Reviewed  CBG MONITORING, ED    EKG: None  Radiology: No results found.   Procedures   Medications Ordered in the ED - No data to display                                  Medical Decision Making  Patient presents because he felt cold outside Has no actual complaint at this time other than chronic feet pain.  He will be discharged     Final diagnoses:  Pain in both feet    ED Discharge Orders     None          Midge Golas, MD 07/06/24 0151  "

## 2024-07-06 NOTE — Congregational Nurse Program (Signed)
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 Yes    Student Assistance [employer]    Location Patient Served  [facility]    Encounter Setting CN site    Population Status Unhoused    Insurance Unknown    Insurance/Financial Assistance Referral N/A    Medication Have Medication Insecurities    Medical Provider Yes    Medical Referrals Made NA    Medical Appointment Completed N/A    Screenings CN Performed (remember to also record results) Blood Glucose    CNP Interventions Diabetes Education;Case Management;Advocate/Support;Navigate Healthcare System    ED Visit Averted Yes        [employer] requested RN to assess client. Upon entering the room, client was noted to be lethargic and minimally difficult to arouse. Client was able to state to RN that he is diabetic and HIV positive. Capillary blood glucose was 63. RN provided client with multiple snacks. Client became significantly more alert and awake after eating. RN returned to the dayroom to recheck blood glucose; however, client was not present at that time.   "
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
## 2024-07-11 ENCOUNTER — Emergency Department (HOSPITAL_COMMUNITY)

## 2024-07-11 ENCOUNTER — Encounter (HOSPITAL_COMMUNITY): Payer: Self-pay | Admitting: Emergency Medicine

## 2024-07-11 ENCOUNTER — Other Ambulatory Visit: Payer: Self-pay

## 2024-07-11 ENCOUNTER — Emergency Department (HOSPITAL_COMMUNITY)
Admission: EM | Admit: 2024-07-11 | Discharge: 2024-07-11 | Disposition: A | Discharge status: Home / Self Care | Attending: Emergency Medicine | Admitting: Emergency Medicine

## 2024-07-11 DIAGNOSIS — M79671 Pain in right foot: Secondary | ICD-10-CM | POA: Insufficient documentation

## 2024-07-11 DIAGNOSIS — Z720 Tobacco use: Secondary | ICD-10-CM | POA: Insufficient documentation

## 2024-07-11 DIAGNOSIS — X31XXXA Exposure to excessive natural cold, initial encounter: Secondary | ICD-10-CM | POA: Insufficient documentation

## 2024-07-11 DIAGNOSIS — M79672 Pain in left foot: Secondary | ICD-10-CM | POA: Diagnosis not present

## 2024-07-11 DIAGNOSIS — E162 Hypoglycemia, unspecified: Secondary | ICD-10-CM | POA: Diagnosis not present

## 2024-07-11 DIAGNOSIS — N189 Chronic kidney disease, unspecified: Secondary | ICD-10-CM | POA: Diagnosis not present

## 2024-07-11 DIAGNOSIS — T699XXA Effect of reduced temperature, unspecified, initial encounter: Secondary | ICD-10-CM | POA: Diagnosis present

## 2024-07-11 DIAGNOSIS — Z21 Asymptomatic human immunodeficiency virus [HIV] infection status: Secondary | ICD-10-CM | POA: Diagnosis not present

## 2024-07-11 LAB — BASIC METABOLIC PANEL WITH GFR
Anion gap: 9 (ref 5–15)
BUN: 13 mg/dL (ref 6–20)
CO2: 25 mmol/L (ref 22–32)
Calcium: 8.6 mg/dL — ABNORMAL LOW (ref 8.9–10.3)
Chloride: 103 mmol/L (ref 98–111)
Creatinine, Ser: 0.89 mg/dL (ref 0.61–1.24)
GFR, Estimated: >60 mL/min
Glucose, Bld: 112 mg/dL — ABNORMAL HIGH (ref 70–99)
Potassium: 4.6 mmol/L (ref 3.5–5.1)
Sodium: 136 mmol/L (ref 135–145)

## 2024-07-11 LAB — CBC
HCT: 41.5 % (ref 39.0–52.0)
Hemoglobin: 13.6 g/dL (ref 13.0–17.0)
MCH: 26.2 pg (ref 26.0–34.0)
MCHC: 32.8 g/dL (ref 30.0–36.0)
MCV: 79.8 fL — ABNORMAL LOW (ref 80.0–100.0)
Platelets: 422 10*3/uL — ABNORMAL HIGH (ref 150–400)
RBC: 5.2 MIL/uL (ref 4.22–5.81)
RDW: 15.4 % (ref 11.5–15.5)
WBC: 7.1 10*3/uL (ref 4.0–10.5)
nRBC: 0 % (ref 0.0–0.2)

## 2024-07-11 LAB — CBG MONITORING, ED: Glucose-Capillary: 96 mg/dL (ref 70–99)

## 2024-07-11 NOTE — Discharge Instructions (Addendum)
 Your lab work is overall reassuring.  Please see attached shelter resource guide.  Please return to the ED with new or worsening symptoms.

## 2024-07-11 NOTE — ED Notes (Signed)
 Pt sleeping at this time.

## 2024-07-11 NOTE — ED Provider Notes (Signed)
 " [place] EMERGENCY DEPARTMENT AT [facility] Provider Note   CSN: [id number] Arrival date & time: 07/11/24  9192     Patient presents with: Hypoglycemia   Blake Chambers is a 34 y.o. male patient with past medical history of bipolar disorder, HIV, chronic renal disease, tobacco use, history of osteomyelitis, polysubstance abuse presents to emergency room with complaint of cold exposure.  Patient reports he was outside laying on the ground for several hours and called EMS due to foot pain.  He was found to be very cold with EMS and by the time he came to emergency room he is feeling better after being rewarmed in the ambulance. No complaints outside of bilateral foot pain.    Hypoglycemia      Prior to Admission medications  Medication Sig Start Date End Date Taking? Authorizing Provider  acetaminophen  (TYLENOL ) 500 MG tablet Take 1 tablet (500 mg total) by mouth every 6 (six) hours as needed. Patient not taking: Reported on 05/04/2024 04/15/24   Silver Fell A, PA  bictegravir-emtricitabine -tenofovir  AF (BIKTARVY ) 50-200-25 MG TABS tablet Take 1 tablet by mouth daily. 05/07/24   Rashid, Farhan, MD  cefadroxil  (DURICEF) 500 MG capsule Take 2 capsules (1,000 mg total) by mouth 2 (two) times daily. 05/07/24   Rashid, Farhan, MD  clotrimazole  (CLOTRIMAZOLE  AF) 1 % cream Apply 1 Application topically 2 (two) times daily. Patient not taking: Reported on 01/10/2024 11/27/23   Calone, Gregory D, FNP  nicotine  (NICODERM CQ  - DOSED IN MG/24 HOURS) 21 mg/24hr patch Place 1 patch (21 mg total) onto the skin daily. 05/08/24   Rashid, Farhan, MD  ondansetron  (ZOFRAN -ODT) 8 MG disintegrating tablet Take 1 tablet (8 mg total) by mouth every 8 (eight) hours as needed for nausea Patient not taking: Reported on 01/10/2024 05/23/23   Palumbo, April, MD  pantoprazole  (PROTONIX ) 40 MG tablet Take 1 tablet (40 mg total) by mouth daily. Patient not taking: Reported on 05/04/2024 09/11/23   Haze Lonni PARAS, MD  valACYclovir  (VALTREX ) 1000 MG tablet Take 1 tablet (1,000 mg total) by mouth 2 (two) times daily. Patient not taking: Reported on 01/10/2024 11/27/23   Calone, Gregory D, FNP    Allergies: Blueberry flavoring agent (non-screening), Pineapple, Tenofovir  disoproxil, and Kiwi extract    Review of Systems  Constitutional:  Positive for activity change.    Updated Vital Signs BP (!) 126/98 (BP Location: Right Arm)   Pulse (!) 107   Temp 98.4 F (36.9 C) (Oral)   Resp 17   SpO2 100%   Physical Exam Vitals and nursing note reviewed.  Constitutional:      General: He is not in acute distress.    Appearance: He is not toxic-appearing.  HENT:     Head: Normocephalic and atraumatic.  Eyes:     General: No scleral icterus.    Conjunctiva/sclera: Conjunctivae normal.  Cardiovascular:     Rate and Rhythm: Normal rate and regular rhythm.     Pulses: Normal pulses.     Heart sounds: Normal heart sounds.  Pulmonary:     Effort: Pulmonary effort is normal. No respiratory distress.     Breath sounds: Normal breath sounds.  Abdominal:     General: Abdomen is flat. Bowel sounds are normal.     Palpations: Abdomen is soft.     Tenderness: There is no abdominal tenderness.  Musculoskeletal:     Comments: Bilateral extremities without rash and without swelling.  Strong dorsal pedal pulse equal bilaterally.  Skin:  General: Skin is warm and dry.     Findings: No lesion.  Neurological:     General: No focal deficit present.     Mental Status: He is alert and oriented to person, place, and time. Mental status is at baseline.     (all labs ordered are listed, but only abnormal results are displayed) Labs Reviewed  CBC - Abnormal; Notable for the following components:      Result Value   MCV 79.8 (*)    Platelets 422 (*)    All other components within normal limits  BASIC METABOLIC PANEL WITH GFR - Abnormal; Notable for the following components:   Glucose, Bld 112  (*)    Calcium 8.6 (*)    All other components within normal limits  CBG MONITORING, ED    EKG: None  Radiology: No results found.   Procedures   Medications Ordered in the ED - No data to display                                  Medical Decision Making Amount and/or Complexity of Data Reviewed Labs: ordered.   This patient presents to the ED for concern of foot pain, this involves an extensive number of treatment options, and is a complaint that carries with it a high risk of complications and morbidity.  The differential diagnosis includes osteomyelitis, DVT, acute arterial occlusion, fracture, hypoglycemia, dehydration, electrolyte abnormality   Lab Tests:  I personally interpreted labs.  The pertinent results include:   CBC with no leukocytosis no anemia BMP glucose WNL, normal kidney function CBG 93   Cardiac Monitoring: / EKG:  The patient was maintained on a cardiac monitor.  EKG showing sinus rhythm, no STEMI, no arrhythmia.   Problem List / ED Course / Critical interventions / Medication management  Patient presents to emergency room with complaint of being cold and bilateral foot pain.  According to EMS he was found laying outside in the cold and was found to have low CBG.  He was given oral glucose and rewarmed and EMS and on arrival to ED has normal vital signs and recheck of glucose is 95.  He reports he is feeling better now that he has been rewarmed but does continue to endorse bilateral lateral foot pain.  On my exam he has no sign of infection.  He is neurovascularly intact.  He has normal range of motion.  Will check basic labs here as well as EKG Labs and imaging are overall reassuring here.  Will ambulate patient and provider with something to eat/drink. I have reviewed the patients home medicines and have made adjustments as needed. Hemodynamically stable and well-appearing feel appropriate for discharge with outpatient follow-up.  Given shelter  resources.        Final diagnoses:  Cold exposure, initial encounter  Bilateral foot pain    ED Discharge Orders     None          Shermon Warren SAILOR, PA-C 07/11/24 1003  "

## 2024-07-11 NOTE — ED Triage Notes (Signed)
 Pt BIB [facility] from [place] for hypothermia/hypoglycemia.  Been lying outside and states he can't feel his extremities.  Good pulse noted by [facility],   \\CBG noted to be in the 30's.  15G oral glucose given by [facility].  188/palp 98% CBG 39

## 2024-07-17 ENCOUNTER — Encounter (HOSPITAL_COMMUNITY): Payer: Self-pay | Admitting: *Deleted

## 2024-07-17 ENCOUNTER — Other Ambulatory Visit: Payer: Self-pay

## 2024-07-17 ENCOUNTER — Emergency Department (HOSPITAL_COMMUNITY)
Admission: EM | Admit: 2024-07-17 | Discharge: 2024-07-17 | Disposition: A | Discharge status: Home / Self Care | Attending: Emergency Medicine | Admitting: Emergency Medicine

## 2024-07-17 ENCOUNTER — Emergency Department (HOSPITAL_COMMUNITY)

## 2024-07-17 DIAGNOSIS — Z5900 Homelessness unspecified: Secondary | ICD-10-CM | POA: Diagnosis not present

## 2024-07-17 DIAGNOSIS — Z89022 Acquired absence of left finger(s): Secondary | ICD-10-CM | POA: Insufficient documentation

## 2024-07-17 DIAGNOSIS — M79671 Pain in right foot: Secondary | ICD-10-CM | POA: Diagnosis present

## 2024-07-17 DIAGNOSIS — L84 Corns and callosities: Secondary | ICD-10-CM | POA: Insufficient documentation

## 2024-07-17 DIAGNOSIS — M79642 Pain in left hand: Secondary | ICD-10-CM | POA: Insufficient documentation

## 2024-07-17 LAB — CBC WITH DIFFERENTIAL/PLATELET
Abs Immature Granulocytes: 0.06 10*3/uL (ref 0.00–0.07)
Basophils Absolute: 0 10*3/uL (ref 0.0–0.1)
Basophils Relative: 0 %
Eosinophils Absolute: 0.1 10*3/uL (ref 0.0–0.5)
Eosinophils Relative: 1 %
HCT: 39.4 % (ref 39.0–52.0)
Hemoglobin: 12.6 g/dL — ABNORMAL LOW (ref 13.0–17.0)
Immature Granulocytes: 1 %
Lymphocytes Relative: 16 %
Lymphs Abs: 1.5 10*3/uL (ref 0.7–4.0)
MCH: 25.4 pg — ABNORMAL LOW (ref 26.0–34.0)
MCHC: 32 g/dL (ref 30.0–36.0)
MCV: 79.4 fL — ABNORMAL LOW (ref 80.0–100.0)
Monocytes Absolute: 0.7 10*3/uL (ref 0.1–1.0)
Monocytes Relative: 8 %
Neutro Abs: 6.8 10*3/uL (ref 1.7–7.7)
Neutrophils Relative %: 74 %
Platelets: 350 10*3/uL (ref 150–400)
RBC: 4.96 MIL/uL (ref 4.22–5.81)
RDW: 15.3 % (ref 11.5–15.5)
WBC: 9.3 10*3/uL (ref 4.0–10.5)
nRBC: 0 % (ref 0.0–0.2)

## 2024-07-17 LAB — BASIC METABOLIC PANEL WITH GFR
Anion gap: 12 (ref 5–15)
BUN: 13 mg/dL (ref 6–20)
CO2: 21 mmol/L — ABNORMAL LOW (ref 22–32)
Calcium: 8.2 mg/dL — ABNORMAL LOW (ref 8.9–10.3)
Chloride: 101 mmol/L (ref 98–111)
Creatinine, Ser: 0.84 mg/dL (ref 0.61–1.24)
GFR, Estimated: >60 mL/min
Glucose, Bld: 93 mg/dL (ref 70–99)
Potassium: 3.9 mmol/L (ref 3.5–5.1)
Sodium: 135 mmol/L (ref 135–145)

## 2024-07-17 MED ORDER — NAPROXEN 250 MG PO TABS
500.0000 mg | ORAL_TABLET | Freq: Once | ORAL | Status: AC
  Administered 2024-07-17: 500 mg via ORAL
  Filled 2024-07-17: qty 2

## 2024-07-17 MED ORDER — NAPROXEN 500 MG PO TABS: 500.0000 mg | ORAL_TABLET | Freq: Two times a day (BID) | ORAL | 0 refills | Status: AC

## 2024-07-17 NOTE — ED Notes (Signed)
EDP at bedside assessing pt.  
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
## 2024-07-17 NOTE — Discharge Instructions (Addendum)
 I have started you on Naproxen  for possible gout please take it 2 times daily for 7 days.  I would like you to follow-up with your primary care provider for reevaluation as soon as possible.  I have given you a number to call to speak with a orthopedic about your hand.  Please follow-up with him as soon as possible for further evaluation.  Monitor for any worsening symptoms including high fever, signs of infection of your foot or hand, dizziness, chest pain, shortness of breath.  If any of these or other concerning symptoms arise please return emergency department for further evaluation.

## 2024-07-17 NOTE — ED Triage Notes (Addendum)
 Pt with numbness to bilateral feet x 2 weeks. Pt states right foot is worse than the left. Pt states he has calluses to both feet. Also wants his fingers looked at.
